# Patient Record
Sex: Female | Born: 1960 | Race: Black or African American | Hispanic: No | Marital: Single | Smoking: Former smoker
Health system: Southern US, Community
[De-identification: ages and names within clinical notes are randomized; demographics above are authoritative.]

## PROBLEM LIST (undated history)

## (undated) DIAGNOSIS — E559 Vitamin D deficiency, unspecified: Secondary | ICD-10-CM

## (undated) DIAGNOSIS — M1990 Unspecified osteoarthritis, unspecified site: Secondary | ICD-10-CM

## (undated) DIAGNOSIS — R079 Chest pain, unspecified: Secondary | ICD-10-CM

## (undated) DIAGNOSIS — N946 Dysmenorrhea, unspecified: Secondary | ICD-10-CM

## (undated) DIAGNOSIS — M549 Dorsalgia, unspecified: Secondary | ICD-10-CM

## (undated) DIAGNOSIS — E119 Type 2 diabetes mellitus without complications: Secondary | ICD-10-CM

## (undated) DIAGNOSIS — I499 Cardiac arrhythmia, unspecified: Secondary | ICD-10-CM

## (undated) DIAGNOSIS — N921 Excessive and frequent menstruation with irregular cycle: Secondary | ICD-10-CM

## (undated) DIAGNOSIS — F419 Anxiety disorder, unspecified: Secondary | ICD-10-CM

## (undated) DIAGNOSIS — I10 Essential (primary) hypertension: Secondary | ICD-10-CM

## (undated) DIAGNOSIS — D219 Benign neoplasm of connective and other soft tissue, unspecified: Secondary | ICD-10-CM

## (undated) DIAGNOSIS — M109 Gout, unspecified: Secondary | ICD-10-CM

## (undated) DIAGNOSIS — I82409 Acute embolism and thrombosis of unspecified deep veins of unspecified lower extremity: Secondary | ICD-10-CM

## (undated) DIAGNOSIS — E039 Hypothyroidism, unspecified: Secondary | ICD-10-CM

## (undated) DIAGNOSIS — R002 Palpitations: Secondary | ICD-10-CM

## (undated) DIAGNOSIS — M2550 Pain in unspecified joint: Secondary | ICD-10-CM

## (undated) DIAGNOSIS — D649 Anemia, unspecified: Secondary | ICD-10-CM

## (undated) DIAGNOSIS — F1910 Other psychoactive substance abuse, uncomplicated: Secondary | ICD-10-CM

## (undated) DIAGNOSIS — E7800 Pure hypercholesterolemia, unspecified: Secondary | ICD-10-CM

## (undated) DIAGNOSIS — R7303 Prediabetes: Secondary | ICD-10-CM

## (undated) DIAGNOSIS — R609 Edema, unspecified: Secondary | ICD-10-CM

## (undated) DIAGNOSIS — K219 Gastro-esophageal reflux disease without esophagitis: Secondary | ICD-10-CM

## (undated) DIAGNOSIS — K760 Fatty (change of) liver, not elsewhere classified: Secondary | ICD-10-CM

## (undated) HISTORY — PX: BUNIONECTOMY: SHX129

## (undated) HISTORY — DX: Benign neoplasm of connective and other soft tissue, unspecified: D21.9

## (undated) HISTORY — DX: Dysmenorrhea, unspecified: N94.6

## (undated) HISTORY — DX: Vitamin D deficiency, unspecified: E55.9

## (undated) HISTORY — PX: CHOLECYSTECTOMY: SHX55

## (undated) HISTORY — DX: Pain in unspecified joint: M25.50

## (undated) HISTORY — DX: Morbid (severe) obesity due to excess calories: E66.01

## (undated) HISTORY — DX: Anemia, unspecified: D64.9

## (undated) HISTORY — DX: Palpitations: R00.2

## (undated) HISTORY — DX: Excessive and frequent menstruation with irregular cycle: N92.1

## (undated) HISTORY — DX: Chest pain, unspecified: R07.9

## (undated) HISTORY — DX: Edema, unspecified: R60.9

## (undated) HISTORY — PX: ECTOPIC PREGNANCY SURGERY: SHX613

## (undated) HISTORY — DX: Hypothyroidism, unspecified: E03.9

## (undated) HISTORY — DX: Unspecified osteoarthritis, unspecified site: M19.90

## (undated) HISTORY — DX: Fatty (change of) liver, not elsewhere classified: K76.0

## (undated) HISTORY — PX: TUBAL LIGATION: SHX77

## (undated) HISTORY — DX: Prediabetes: R73.03

## (undated) HISTORY — DX: Dorsalgia, unspecified: M54.9

---

## 1999-12-27 ENCOUNTER — Emergency Department (HOSPITAL_COMMUNITY): Admission: EM | Admit: 1999-12-27 | Discharge: 1999-12-27 | Payer: Self-pay | Admitting: *Deleted

## 2000-04-21 ENCOUNTER — Emergency Department (HOSPITAL_COMMUNITY): Admission: EM | Admit: 2000-04-21 | Discharge: 2000-04-21 | Payer: Self-pay | Admitting: Emergency Medicine

## 2000-05-19 ENCOUNTER — Emergency Department (HOSPITAL_COMMUNITY): Admission: EM | Admit: 2000-05-19 | Discharge: 2000-05-19 | Payer: Self-pay | Admitting: Emergency Medicine

## 2001-05-16 ENCOUNTER — Emergency Department (HOSPITAL_COMMUNITY): Admission: EM | Admit: 2001-05-16 | Discharge: 2001-05-16 | Payer: Self-pay | Admitting: Emergency Medicine

## 2001-05-16 ENCOUNTER — Encounter: Payer: Self-pay | Admitting: Emergency Medicine

## 2001-07-18 ENCOUNTER — Emergency Department (HOSPITAL_COMMUNITY): Admission: EM | Admit: 2001-07-18 | Discharge: 2001-07-18 | Payer: Self-pay | Admitting: Emergency Medicine

## 2001-07-18 ENCOUNTER — Encounter: Payer: Self-pay | Admitting: Emergency Medicine

## 2001-12-16 ENCOUNTER — Encounter: Payer: Self-pay | Admitting: Internal Medicine

## 2001-12-16 ENCOUNTER — Ambulatory Visit (HOSPITAL_COMMUNITY): Admission: RE | Admit: 2001-12-16 | Discharge: 2001-12-16 | Payer: Self-pay | Admitting: Internal Medicine

## 2002-05-23 ENCOUNTER — Emergency Department (HOSPITAL_COMMUNITY): Admission: EM | Admit: 2002-05-23 | Discharge: 2002-05-23 | Payer: Self-pay | Admitting: Emergency Medicine

## 2002-12-28 ENCOUNTER — Emergency Department (HOSPITAL_COMMUNITY): Admission: EM | Admit: 2002-12-28 | Discharge: 2002-12-28 | Payer: Self-pay | Admitting: Emergency Medicine

## 2003-04-26 ENCOUNTER — Encounter: Payer: Self-pay | Admitting: Emergency Medicine

## 2003-04-26 ENCOUNTER — Emergency Department (HOSPITAL_COMMUNITY): Admission: EM | Admit: 2003-04-26 | Discharge: 2003-04-27 | Payer: Self-pay | Admitting: Emergency Medicine

## 2003-11-23 ENCOUNTER — Emergency Department (HOSPITAL_COMMUNITY): Admission: EM | Admit: 2003-11-23 | Discharge: 2003-11-23 | Payer: Self-pay | Admitting: Emergency Medicine

## 2004-02-10 ENCOUNTER — Emergency Department (HOSPITAL_COMMUNITY): Admission: EM | Admit: 2004-02-10 | Discharge: 2004-02-10 | Payer: Self-pay | Admitting: Emergency Medicine

## 2004-09-25 ENCOUNTER — Ambulatory Visit: Payer: Self-pay | Admitting: Family Medicine

## 2004-11-18 ENCOUNTER — Ambulatory Visit: Payer: Self-pay | Admitting: Nurse Practitioner

## 2004-12-23 ENCOUNTER — Ambulatory Visit (HOSPITAL_COMMUNITY): Admission: RE | Admit: 2004-12-23 | Discharge: 2004-12-23 | Payer: Self-pay | Admitting: Hematology and Oncology

## 2004-12-23 ENCOUNTER — Ambulatory Visit: Payer: Self-pay | Admitting: Internal Medicine

## 2004-12-26 ENCOUNTER — Ambulatory Visit: Payer: Self-pay | Admitting: Internal Medicine

## 2004-12-28 ENCOUNTER — Ambulatory Visit (HOSPITAL_COMMUNITY): Admission: RE | Admit: 2004-12-28 | Discharge: 2004-12-28 | Payer: Self-pay | Admitting: Internal Medicine

## 2005-02-13 ENCOUNTER — Ambulatory Visit: Payer: Self-pay | Admitting: Internal Medicine

## 2005-02-23 ENCOUNTER — Encounter: Admission: RE | Admit: 2005-02-23 | Discharge: 2005-03-25 | Payer: Self-pay | Admitting: Internal Medicine

## 2005-04-24 ENCOUNTER — Emergency Department (HOSPITAL_COMMUNITY): Admission: EM | Admit: 2005-04-24 | Discharge: 2005-04-24 | Payer: Self-pay | Admitting: Emergency Medicine

## 2005-04-28 ENCOUNTER — Ambulatory Visit: Payer: Self-pay | Admitting: Internal Medicine

## 2005-06-03 ENCOUNTER — Ambulatory Visit: Payer: Self-pay | Admitting: Internal Medicine

## 2005-06-11 ENCOUNTER — Ambulatory Visit: Payer: Self-pay | Admitting: Internal Medicine

## 2005-08-30 ENCOUNTER — Emergency Department (HOSPITAL_COMMUNITY): Admission: EM | Admit: 2005-08-30 | Discharge: 2005-08-30 | Payer: Self-pay | Admitting: Emergency Medicine

## 2005-12-18 ENCOUNTER — Ambulatory Visit: Payer: Self-pay | Admitting: Internal Medicine

## 2005-12-25 ENCOUNTER — Ambulatory Visit (HOSPITAL_COMMUNITY): Admission: RE | Admit: 2005-12-25 | Discharge: 2005-12-25 | Payer: Self-pay | Admitting: Family Medicine

## 2006-01-12 ENCOUNTER — Ambulatory Visit: Payer: Self-pay | Admitting: Internal Medicine

## 2006-02-02 ENCOUNTER — Other Ambulatory Visit: Admission: RE | Admit: 2006-02-02 | Discharge: 2006-02-02 | Payer: Self-pay | Admitting: Family Medicine

## 2006-02-02 ENCOUNTER — Ambulatory Visit: Payer: Self-pay | Admitting: Nurse Practitioner

## 2006-02-02 ENCOUNTER — Ambulatory Visit: Payer: Self-pay | Admitting: Internal Medicine

## 2006-02-02 ENCOUNTER — Encounter: Payer: Self-pay | Admitting: Internal Medicine

## 2006-02-09 ENCOUNTER — Encounter (INDEPENDENT_AMBULATORY_CARE_PROVIDER_SITE_OTHER): Payer: Self-pay | Admitting: Internal Medicine

## 2006-03-19 ENCOUNTER — Ambulatory Visit: Payer: Self-pay | Admitting: Family Medicine

## 2006-03-26 ENCOUNTER — Inpatient Hospital Stay (HOSPITAL_COMMUNITY): Admission: AD | Admit: 2006-03-26 | Discharge: 2006-03-26 | Payer: Self-pay | Admitting: Obstetrics & Gynecology

## 2006-04-05 ENCOUNTER — Ambulatory Visit: Payer: Self-pay | Admitting: Internal Medicine

## 2006-05-04 ENCOUNTER — Ambulatory Visit (HOSPITAL_COMMUNITY): Admission: RE | Admit: 2006-05-04 | Discharge: 2006-05-04 | Payer: Self-pay | Admitting: Internal Medicine

## 2006-05-04 ENCOUNTER — Ambulatory Visit: Payer: Self-pay | Admitting: Internal Medicine

## 2006-05-06 ENCOUNTER — Ambulatory Visit: Payer: Self-pay | Admitting: Internal Medicine

## 2006-07-15 ENCOUNTER — Ambulatory Visit: Payer: Self-pay | Admitting: Internal Medicine

## 2006-07-29 ENCOUNTER — Ambulatory Visit: Payer: Self-pay | Admitting: Internal Medicine

## 2006-08-05 ENCOUNTER — Ambulatory Visit: Payer: Self-pay | Admitting: Family Medicine

## 2006-09-23 ENCOUNTER — Ambulatory Visit: Payer: Self-pay | Admitting: Internal Medicine

## 2006-10-12 HISTORY — PX: ABDOMINAL HYSTERECTOMY: SHX81

## 2006-11-04 ENCOUNTER — Encounter (INDEPENDENT_AMBULATORY_CARE_PROVIDER_SITE_OTHER): Payer: Self-pay | Admitting: Specialist

## 2006-11-04 ENCOUNTER — Inpatient Hospital Stay (HOSPITAL_COMMUNITY): Admission: RE | Admit: 2006-11-04 | Discharge: 2006-11-05 | Payer: Self-pay | Admitting: Obstetrics and Gynecology

## 2006-12-24 ENCOUNTER — Ambulatory Visit: Payer: Self-pay | Admitting: Internal Medicine

## 2007-01-12 ENCOUNTER — Ambulatory Visit (HOSPITAL_COMMUNITY): Admission: RE | Admit: 2007-01-12 | Discharge: 2007-01-12 | Payer: Self-pay | Admitting: Family Medicine

## 2007-02-10 ENCOUNTER — Ambulatory Visit: Payer: Self-pay | Admitting: Internal Medicine

## 2007-03-09 ENCOUNTER — Encounter: Admission: RE | Admit: 2007-03-09 | Discharge: 2007-06-07 | Payer: Self-pay | Admitting: Internal Medicine

## 2007-05-23 ENCOUNTER — Encounter (INDEPENDENT_AMBULATORY_CARE_PROVIDER_SITE_OTHER): Payer: Self-pay | Admitting: Internal Medicine

## 2007-05-23 DIAGNOSIS — M67919 Unspecified disorder of synovium and tendon, unspecified shoulder: Secondary | ICD-10-CM | POA: Insufficient documentation

## 2007-05-23 DIAGNOSIS — Z8739 Personal history of other diseases of the musculoskeletal system and connective tissue: Secondary | ICD-10-CM | POA: Insufficient documentation

## 2007-05-23 DIAGNOSIS — M719 Bursopathy, unspecified: Secondary | ICD-10-CM

## 2007-05-23 DIAGNOSIS — N949 Unspecified condition associated with female genital organs and menstrual cycle: Secondary | ICD-10-CM

## 2007-05-23 DIAGNOSIS — E785 Hyperlipidemia, unspecified: Secondary | ICD-10-CM | POA: Insufficient documentation

## 2007-05-23 DIAGNOSIS — N925 Other specified irregular menstruation: Secondary | ICD-10-CM | POA: Insufficient documentation

## 2007-05-23 DIAGNOSIS — N938 Other specified abnormal uterine and vaginal bleeding: Secondary | ICD-10-CM | POA: Insufficient documentation

## 2007-07-14 ENCOUNTER — Ambulatory Visit: Payer: Self-pay | Admitting: Internal Medicine

## 2007-07-14 LAB — CONVERTED CEMR LAB
Albumin: 4.5 g/dL (ref 3.5–5.2)
Alkaline Phosphatase: 45 units/L (ref 39–117)
BUN: 9 mg/dL (ref 6–23)
Calcium: 9.2 mg/dL (ref 8.4–10.5)
Glucose, Bld: 78 mg/dL (ref 70–99)
HDL: 43 mg/dL (ref >39)
LDL Cholesterol: 86 mg/dL (ref 0–99)
Potassium: 4.3 meq/L (ref 3.5–5.3)
Triglycerides: 218 mg/dL — ABNORMAL HIGH (ref <150)

## 2007-09-13 ENCOUNTER — Emergency Department (HOSPITAL_COMMUNITY): Admission: EM | Admit: 2007-09-13 | Discharge: 2007-09-13 | Payer: Self-pay | Admitting: Emergency Medicine

## 2007-09-30 ENCOUNTER — Ambulatory Visit: Payer: Self-pay | Admitting: Internal Medicine

## 2007-10-13 HISTORY — PX: ROTATOR CUFF REPAIR: SHX139

## 2007-10-29 ENCOUNTER — Encounter: Admission: RE | Admit: 2007-10-29 | Discharge: 2007-10-29 | Payer: Self-pay | Admitting: Orthopaedic Surgery

## 2007-11-17 ENCOUNTER — Ambulatory Visit (HOSPITAL_BASED_OUTPATIENT_CLINIC_OR_DEPARTMENT_OTHER): Admission: RE | Admit: 2007-11-17 | Discharge: 2007-11-18 | Payer: Self-pay | Admitting: Orthopaedic Surgery

## 2007-12-05 ENCOUNTER — Encounter: Admission: RE | Admit: 2007-12-05 | Discharge: 2008-03-04 | Payer: Self-pay | Admitting: Orthopaedic Surgery

## 2007-12-14 ENCOUNTER — Ambulatory Visit: Payer: Self-pay | Admitting: Internal Medicine

## 2007-12-28 ENCOUNTER — Ambulatory Visit: Payer: Self-pay | Admitting: Internal Medicine

## 2008-01-04 ENCOUNTER — Ambulatory Visit: Payer: Self-pay | Admitting: Internal Medicine

## 2008-01-10 ENCOUNTER — Ambulatory Visit: Payer: Self-pay | Admitting: Internal Medicine

## 2008-01-13 ENCOUNTER — Ambulatory Visit (HOSPITAL_COMMUNITY): Admission: RE | Admit: 2008-01-13 | Discharge: 2008-01-13 | Payer: Self-pay | Admitting: Family Medicine

## 2008-01-24 ENCOUNTER — Ambulatory Visit: Payer: Self-pay | Admitting: Internal Medicine

## 2008-02-01 ENCOUNTER — Ambulatory Visit: Payer: Self-pay | Admitting: Internal Medicine

## 2008-02-02 ENCOUNTER — Ambulatory Visit: Payer: Self-pay | Admitting: Internal Medicine

## 2008-02-29 ENCOUNTER — Ambulatory Visit: Payer: Self-pay | Admitting: Internal Medicine

## 2008-03-06 ENCOUNTER — Encounter: Admission: RE | Admit: 2008-03-06 | Discharge: 2008-05-17 | Payer: Self-pay | Admitting: Orthopaedic Surgery

## 2008-03-07 ENCOUNTER — Ambulatory Visit: Payer: Self-pay | Admitting: Internal Medicine

## 2008-03-28 ENCOUNTER — Ambulatory Visit: Payer: Self-pay | Admitting: Internal Medicine

## 2008-04-05 ENCOUNTER — Emergency Department (HOSPITAL_COMMUNITY): Admission: EM | Admit: 2008-04-05 | Discharge: 2008-04-05 | Payer: Self-pay | Admitting: Emergency Medicine

## 2008-04-15 ENCOUNTER — Encounter: Admission: RE | Admit: 2008-04-15 | Discharge: 2008-04-15 | Payer: Self-pay | Admitting: Orthopaedic Surgery

## 2008-04-29 ENCOUNTER — Emergency Department (HOSPITAL_COMMUNITY): Admission: EM | Admit: 2008-04-29 | Discharge: 2008-04-29 | Payer: Self-pay | Admitting: Emergency Medicine

## 2008-05-10 ENCOUNTER — Encounter: Admission: RE | Admit: 2008-05-10 | Discharge: 2008-05-10 | Payer: Self-pay | Admitting: Orthopaedic Surgery

## 2008-05-28 ENCOUNTER — Ambulatory Visit: Payer: Self-pay | Admitting: Internal Medicine

## 2008-06-20 ENCOUNTER — Encounter: Admission: RE | Admit: 2008-06-20 | Discharge: 2008-08-28 | Payer: Self-pay | Admitting: Orthopaedic Surgery

## 2008-08-13 ENCOUNTER — Ambulatory Visit: Payer: Self-pay | Admitting: Internal Medicine

## 2008-08-29 ENCOUNTER — Ambulatory Visit: Payer: Self-pay | Admitting: Internal Medicine

## 2008-09-05 ENCOUNTER — Ambulatory Visit: Payer: Self-pay | Admitting: Internal Medicine

## 2008-09-13 ENCOUNTER — Encounter: Admission: RE | Admit: 2008-09-13 | Discharge: 2008-10-08 | Payer: Self-pay | Admitting: Orthopaedic Surgery

## 2008-10-09 ENCOUNTER — Ambulatory Visit: Payer: Self-pay | Admitting: Internal Medicine

## 2008-10-09 LAB — CONVERTED CEMR LAB
Total CHOL/HDL Ratio: 3.8
VLDL: 29 mg/dL (ref 0–40)

## 2008-10-16 ENCOUNTER — Encounter: Admission: RE | Admit: 2008-10-16 | Discharge: 2008-11-13 | Payer: Self-pay | Admitting: Orthopaedic Surgery

## 2008-11-26 ENCOUNTER — Ambulatory Visit: Payer: Self-pay | Admitting: Internal Medicine

## 2008-12-06 ENCOUNTER — Emergency Department (HOSPITAL_COMMUNITY): Admission: EM | Admit: 2008-12-06 | Discharge: 2008-12-06 | Payer: Self-pay | Admitting: Emergency Medicine

## 2009-02-01 ENCOUNTER — Ambulatory Visit (HOSPITAL_COMMUNITY): Admission: RE | Admit: 2009-02-01 | Discharge: 2009-02-01 | Payer: Self-pay | Admitting: Internal Medicine

## 2009-03-21 ENCOUNTER — Ambulatory Visit: Payer: Self-pay | Admitting: Internal Medicine

## 2009-07-27 ENCOUNTER — Emergency Department (HOSPITAL_COMMUNITY): Admission: EM | Admit: 2009-07-27 | Discharge: 2009-07-27 | Payer: Self-pay | Admitting: Emergency Medicine

## 2009-09-25 ENCOUNTER — Ambulatory Visit: Payer: Self-pay | Admitting: Internal Medicine

## 2009-10-07 ENCOUNTER — Ambulatory Visit: Payer: Self-pay | Admitting: Internal Medicine

## 2009-10-07 LAB — CONVERTED CEMR LAB
ALT: 20 units/L (ref 0–35)
AST: 13 units/L (ref 0–37)
Albumin: 4.4 g/dL (ref 3.5–5.2)
Alkaline Phosphatase: 70 units/L (ref 39–117)
LDL Cholesterol: 142 mg/dL — ABNORMAL HIGH (ref 0–99)
Potassium: 4 meq/L (ref 3.5–5.3)
Sodium: 141 meq/L (ref 135–145)
Total Protein: 7.8 g/dL (ref 6.0–8.3)

## 2009-11-20 ENCOUNTER — Ambulatory Visit: Payer: Self-pay | Admitting: Internal Medicine

## 2009-11-20 LAB — CONVERTED CEMR LAB: TSH: 2.573 microintl units/mL (ref 0.350–4.500)

## 2009-11-29 ENCOUNTER — Ambulatory Visit: Payer: Self-pay | Admitting: Internal Medicine

## 2009-12-04 ENCOUNTER — Ambulatory Visit: Payer: Self-pay | Admitting: Internal Medicine

## 2009-12-24 ENCOUNTER — Ambulatory Visit: Payer: Self-pay | Admitting: Internal Medicine

## 2009-12-30 ENCOUNTER — Emergency Department (HOSPITAL_COMMUNITY): Admission: EM | Admit: 2009-12-30 | Discharge: 2009-12-30 | Payer: Self-pay | Admitting: Emergency Medicine

## 2010-01-15 ENCOUNTER — Emergency Department (HOSPITAL_COMMUNITY): Admission: EM | Admit: 2010-01-15 | Discharge: 2010-01-15 | Payer: Self-pay | Admitting: Emergency Medicine

## 2010-03-06 ENCOUNTER — Ambulatory Visit: Payer: Self-pay | Admitting: Internal Medicine

## 2010-04-11 ENCOUNTER — Ambulatory Visit: Payer: Self-pay | Admitting: Internal Medicine

## 2010-05-09 ENCOUNTER — Emergency Department (HOSPITAL_COMMUNITY): Admission: EM | Admit: 2010-05-09 | Discharge: 2010-05-09 | Payer: Self-pay | Admitting: Emergency Medicine

## 2010-07-24 ENCOUNTER — Ambulatory Visit: Payer: Self-pay | Admitting: Internal Medicine

## 2010-10-04 ENCOUNTER — Inpatient Hospital Stay (HOSPITAL_COMMUNITY)
Admission: AD | Admit: 2010-10-04 | Discharge: 2010-10-04 | Payer: Self-pay | Source: Home / Self Care | Attending: Obstetrics & Gynecology | Admitting: Obstetrics & Gynecology

## 2010-11-05 ENCOUNTER — Other Ambulatory Visit (HOSPITAL_COMMUNITY): Payer: Self-pay | Admitting: Family Medicine

## 2010-11-05 DIAGNOSIS — Z139 Encounter for screening, unspecified: Secondary | ICD-10-CM

## 2010-11-05 DIAGNOSIS — Z1231 Encounter for screening mammogram for malignant neoplasm of breast: Secondary | ICD-10-CM

## 2010-11-13 ENCOUNTER — Ambulatory Visit (HOSPITAL_COMMUNITY): Payer: Self-pay

## 2010-11-13 ENCOUNTER — Emergency Department (HOSPITAL_COMMUNITY)
Admission: EM | Admit: 2010-11-13 | Discharge: 2010-11-13 | Disposition: A | Discharge status: Home / Self Care | Payer: Medicaid Other | Attending: Emergency Medicine | Admitting: Emergency Medicine

## 2010-11-13 DIAGNOSIS — M79609 Pain in unspecified limb: Secondary | ICD-10-CM | POA: Insufficient documentation

## 2010-11-13 DIAGNOSIS — G8929 Other chronic pain: Secondary | ICD-10-CM | POA: Insufficient documentation

## 2010-11-13 DIAGNOSIS — Y92009 Unspecified place in unspecified non-institutional (private) residence as the place of occurrence of the external cause: Secondary | ICD-10-CM | POA: Insufficient documentation

## 2010-11-13 DIAGNOSIS — K219 Gastro-esophageal reflux disease without esophagitis: Secondary | ICD-10-CM | POA: Insufficient documentation

## 2010-11-13 DIAGNOSIS — Z79899 Other long term (current) drug therapy: Secondary | ICD-10-CM | POA: Insufficient documentation

## 2010-11-13 DIAGNOSIS — F17200 Nicotine dependence, unspecified, uncomplicated: Secondary | ICD-10-CM | POA: Insufficient documentation

## 2010-11-13 DIAGNOSIS — Z8739 Personal history of other diseases of the musculoskeletal system and connective tissue: Secondary | ICD-10-CM | POA: Insufficient documentation

## 2010-11-13 DIAGNOSIS — Y9367 Activity, basketball: Secondary | ICD-10-CM | POA: Insufficient documentation

## 2010-11-13 DIAGNOSIS — X500XXA Overexertion from strenuous movement or load, initial encounter: Secondary | ICD-10-CM | POA: Insufficient documentation

## 2010-11-13 DIAGNOSIS — S838X9A Sprain of other specified parts of unspecified knee, initial encounter: Secondary | ICD-10-CM | POA: Insufficient documentation

## 2010-11-26 ENCOUNTER — Encounter (INDEPENDENT_AMBULATORY_CARE_PROVIDER_SITE_OTHER): Payer: Self-pay | Admitting: *Deleted

## 2010-11-26 LAB — CONVERTED CEMR LAB
ALT: 13 units/L (ref 0–35)
Total Protein: 7.5 g/dL (ref 6.0–8.3)

## 2010-11-29 ENCOUNTER — Emergency Department (HOSPITAL_COMMUNITY)
Admission: EM | Admit: 2010-11-29 | Discharge: 2010-11-29 | Disposition: A | Discharge status: Home / Self Care | Payer: Medicaid Other | Attending: Emergency Medicine | Admitting: Emergency Medicine

## 2010-11-29 DIAGNOSIS — I824Z9 Acute embolism and thrombosis of unspecified deep veins of unspecified distal lower extremity: Secondary | ICD-10-CM | POA: Insufficient documentation

## 2010-11-29 DIAGNOSIS — E7800 Pure hypercholesterolemia, unspecified: Secondary | ICD-10-CM | POA: Insufficient documentation

## 2010-11-29 DIAGNOSIS — F329 Major depressive disorder, single episode, unspecified: Secondary | ICD-10-CM | POA: Insufficient documentation

## 2010-11-29 DIAGNOSIS — M79609 Pain in unspecified limb: Secondary | ICD-10-CM

## 2010-11-29 DIAGNOSIS — F3289 Other specified depressive episodes: Secondary | ICD-10-CM | POA: Insufficient documentation

## 2010-11-29 DIAGNOSIS — M129 Arthropathy, unspecified: Secondary | ICD-10-CM | POA: Insufficient documentation

## 2010-11-29 DIAGNOSIS — K219 Gastro-esophageal reflux disease without esophagitis: Secondary | ICD-10-CM | POA: Insufficient documentation

## 2010-11-29 LAB — PROTIME-INR
INR: 0.99 (ref 0.00–1.49)
Prothrombin Time: 13.3 seconds (ref 11.6–15.2)

## 2010-12-10 ENCOUNTER — Ambulatory Visit: Payer: Medicaid Other | Admitting: Rehabilitation

## 2010-12-15 ENCOUNTER — Ambulatory Visit: Payer: Medicaid Other | Attending: Orthopaedic Surgery | Admitting: Physical Therapy

## 2010-12-15 DIAGNOSIS — M25559 Pain in unspecified hip: Secondary | ICD-10-CM | POA: Insufficient documentation

## 2010-12-15 DIAGNOSIS — M545 Low back pain, unspecified: Secondary | ICD-10-CM | POA: Insufficient documentation

## 2010-12-15 DIAGNOSIS — IMO0001 Reserved for inherently not codable concepts without codable children: Secondary | ICD-10-CM | POA: Insufficient documentation

## 2010-12-15 DIAGNOSIS — M2569 Stiffness of other specified joint, not elsewhere classified: Secondary | ICD-10-CM | POA: Insufficient documentation

## 2010-12-23 ENCOUNTER — Ambulatory Visit: Payer: Medicaid Other | Admitting: Physical Therapy

## 2010-12-24 ENCOUNTER — Emergency Department (HOSPITAL_COMMUNITY): Payer: Medicaid Other

## 2010-12-24 ENCOUNTER — Emergency Department (HOSPITAL_COMMUNITY)
Admission: EM | Admit: 2010-12-24 | Discharge: 2010-12-24 | Disposition: A | Discharge status: Home / Self Care | Payer: Medicaid Other | Attending: Emergency Medicine | Admitting: Emergency Medicine

## 2010-12-24 DIAGNOSIS — T148XXA Other injury of unspecified body region, initial encounter: Secondary | ICD-10-CM | POA: Insufficient documentation

## 2010-12-24 DIAGNOSIS — K219 Gastro-esophageal reflux disease without esophagitis: Secondary | ICD-10-CM | POA: Insufficient documentation

## 2010-12-24 DIAGNOSIS — E785 Hyperlipidemia, unspecified: Secondary | ICD-10-CM | POA: Insufficient documentation

## 2010-12-24 DIAGNOSIS — I10 Essential (primary) hypertension: Secondary | ICD-10-CM | POA: Insufficient documentation

## 2010-12-24 DIAGNOSIS — M79609 Pain in unspecified limb: Secondary | ICD-10-CM | POA: Insufficient documentation

## 2010-12-24 DIAGNOSIS — I82409 Acute embolism and thrombosis of unspecified deep veins of unspecified lower extremity: Secondary | ICD-10-CM | POA: Insufficient documentation

## 2010-12-24 DIAGNOSIS — Y92009 Unspecified place in unspecified non-institutional (private) residence as the place of occurrence of the external cause: Secondary | ICD-10-CM | POA: Insufficient documentation

## 2010-12-24 DIAGNOSIS — Z7901 Long term (current) use of anticoagulants: Secondary | ICD-10-CM | POA: Insufficient documentation

## 2010-12-24 DIAGNOSIS — W010XXA Fall on same level from slipping, tripping and stumbling without subsequent striking against object, initial encounter: Secondary | ICD-10-CM | POA: Insufficient documentation

## 2010-12-30 ENCOUNTER — Ambulatory Visit: Payer: Medicaid Other | Admitting: Physical Therapy

## 2010-12-31 LAB — GLUCOSE, CAPILLARY: Glucose-Capillary: 115 mg/dL — ABNORMAL HIGH (ref 70–99)

## 2010-12-31 LAB — BASIC METABOLIC PANEL
BUN: 8 mg/dL (ref 6–23)
CO2: 27 mEq/L (ref 19–32)
Calcium: 8.8 mg/dL (ref 8.4–10.5)
Creatinine, Ser: 0.84 mg/dL (ref 0.4–1.2)
GFR calc Af Amer: >60 mL/min (ref >60)

## 2010-12-31 LAB — POCT CARDIAC MARKERS

## 2011-01-06 ENCOUNTER — Ambulatory Visit: Payer: Medicaid Other | Admitting: Physical Therapy

## 2011-01-09 ENCOUNTER — Encounter (INDEPENDENT_AMBULATORY_CARE_PROVIDER_SITE_OTHER): Payer: Self-pay | Admitting: *Deleted

## 2011-01-09 LAB — CONVERTED CEMR LAB
BUN: 9 mg/dL (ref 6–23)
Chloride: 101 meq/L (ref 96–112)
Potassium: 3.7 meq/L (ref 3.5–5.3)
Sodium: 141 meq/L (ref 135–145)

## 2011-01-10 ENCOUNTER — Emergency Department (HOSPITAL_COMMUNITY)
Admission: EM | Admit: 2011-01-10 | Discharge: 2011-01-10 | Disposition: A | Discharge status: Home / Self Care | Payer: Medicaid Other | Attending: Emergency Medicine | Admitting: Emergency Medicine

## 2011-01-10 DIAGNOSIS — I10 Essential (primary) hypertension: Secondary | ICD-10-CM | POA: Insufficient documentation

## 2011-01-10 DIAGNOSIS — Z7901 Long term (current) use of anticoagulants: Secondary | ICD-10-CM | POA: Insufficient documentation

## 2011-01-10 DIAGNOSIS — IMO0002 Reserved for concepts with insufficient information to code with codable children: Secondary | ICD-10-CM | POA: Insufficient documentation

## 2011-01-10 DIAGNOSIS — Z86718 Personal history of other venous thrombosis and embolism: Secondary | ICD-10-CM | POA: Insufficient documentation

## 2011-01-10 LAB — PROTIME-INR: Prothrombin Time: 24.5 seconds — ABNORMAL HIGH (ref 11.6–15.2)

## 2011-02-24 NOTE — Op Note (Signed)
NAMEZENIAH, BRINEY NO.:  [id number]   MEDICAL RECORD NO.:  [id number]          PATIENT TYPE:  AMB   LOCATION:  DSC                          FACILITY:  [facility]   PHYSICIAN:  Claude Manges. Whitfield, M.D.DATE OF BIRTH:  [birth year]   DATE OF PROCEDURE:  11/17/2007  DATE OF DISCHARGE:  11/18/2007                               OPERATIVE REPORT   PREOPERATIVE DIAGNOSES:  1. Impingement, right shoulder, with rotator cuff tear.  2. Degenerative joint disease, acromioclavicular joint.   POSTOPERATIVE DIAGNOSES:  1. Impingement, right shoulder, with rotator cuff tear.  2. Degenerative joint disease, acromioclavicular joint.   PROCEDURES:  1. Diagnostic arthroscopy, right shoulder.  2. Arthroscopic subacromial decompression.  3. Arthroscopic distal clavicle resection.  4. Mini open rotator cuff tear repair.   SURGEON:  Claude Manges. Cleophas Dunker, M.D.   ASSISTANT:  Arlys John D. Petrarca, P.A.-C.   ANESTHESIA:  General with interscalene nerve block.   COMPLICATIONS:  None.   HISTORY:  A 50 year old female, has had over a year history of recurrent  pain in both of her shoulders, more recently right worse than left.  She  has evidence of impingement syndrome.  She is had cortisone and pain  medicine and exercises, only to have recurrent pain to the point of  compromise.  She has been having particular trouble on the right side,  where she has difficulty placing her arm over her head and sleeping.  We  did obtain an MRI scan where there may be a rotator cuff tear along the  far lateral supraspinatus associated with bulky AC joint degenerative  change, subacromial deltoid bursitis and a type 2 acromion.  She wishes  to proceed with surgical evaluation.   PROCEDURE:  With the patient comfortable on the operating table and  under general orotracheal anesthesia with a supplemental interscalene  nerve block, the patient was placed in semi-sitting position with the  shoulder frame.  The  right shoulder was then prepped with a DuraPrep  from the base of the neck circumferentially to below the elbow.  Sterile  draping was performed.   A marking pen was used to outline the [facility] joint, the coracoid and the  acromion.  At a point a fingerbreadth posterior and medial to the  posterior angle of the acromion, a small stab wound was made.  The  arthroscope was then easily placed into the shoulder joint.  Diagnostic  arthroscopy revealed some very minimal partial tearing of the rotator  cuff and specifically the supraspinatus.  I did not see any loose  bodies, labral pathology or chondromalacia.  The subscapularis was  intact.   A second portal was established anteriorly and shaving of the partial  rotator cuff tear was performed.   The scope was then placed in the subacromial space posteriorly, the  cannula in the subacromial space anteriorly and the lateral portal  established in the subacromial space.  An arthroscopic subacromial  decompression was performed.  There was a moderate amount of bursal  tissue that was resected.  There was a moderate amount of synovitis at  the [facility] joint.  There was obvious overhang  of the anterior acromion.  An  anterior-inferior acromioplasty was performed with a 6-mm bur.  There  was even evidence of impingement laterally, and a lateral acromionectomy  was performed, a very nice decompression.  At that point I could  visualize the cuff tear anteriorly.  It was U-shaped the insertion of  the supraspinatus.  I did a distal clavicle resection with a 6-mm bur  with a very nice resection and removed all the hypertrophic synovial  tissue.   At that point a mini open rotator cuff tear repair was performed.  About  an inch and a half incision was made along the anterior aspect of the  shoulder, carried down through abundant adipose tissue.  A self-  retaining retractor was inserted.  Deltoid fascia was incised along the  length of the skin incision.   Subacromial space was entered.  The cuff  tear was identified.  I i.e. abraded the bone surface with a rongeur so  there was a nice bleeding surface and inserted to Mitek anchors for  repair.  A nice repair by finger palpation.  I had nice decompression of  the acromion.   The wound was irrigated, the deltoid fascia closed with a running  0Vicryl, the subcu in several layers of 2-0 Vicryl, skin closed with  Steri-Strips over Benzoin.  A sterile bulky dressing was applied,  followed by a sling.   PLAN:  Percocet for pain.  Recovery care overnight.  Office 1 week.      Claude Manges. Cleophas Dunker, M.D.  Electronically Signed     PWW/MEDQ  D:  11/17/2007  T:  11/18/2007  Job:  [id number]

## 2011-02-27 NOTE — Op Note (Signed)
NAMEAJAYA, CRUTCHFIELD NO.:  [id number]   MEDICAL RECORD NO.:  [id number]          PATIENT TYPE:  INP   LOCATION:  9319                          [facility]ACILITY:  [facility]   PHYSICIAN:  Osborn Coho, M.D.   DATE OF BIRTH:  Mar 05, 1961   DATE OF PROCEDURE:  11/04/2006  DATE OF DISCHARGE:  11/05/2006                               OPERATIVE REPORT   PREOPERATIVE DIAGNOSES:  1. Symptomatic fibroids.  2. Metrorrhagia.  3. Dysmenorrhea.  4. Anemia.   POSTOPERATIVE DIAGNOSIS:  1. Symptomatic fibroids.  2. Metrorrhagia.  3. Dysmenorrhea.  4. Anemia.   PROCEDURE:  Laparoscopically-assisted vaginal hysterectomy.   ATTENDING:  Dr. Osborn Coho.   ASSISTANT:  Elmira J. Lowell Guitar, PA-C.   ANESTHESIA:  General.   SPECIMENS TO PATHOLOGY:  Uterus and cervix weighing 239 grams.   FLUIDS:  1800 mL.   URINE OUTPUT:  500 mL.   ESTIMATED BLOOD LOSS:  200 mL.   COMPLICATIONS:  None.   FINDINGS:  Hemorrhagic cyst on the right ovary and no apparent left  ovary or fallopian tube visualized.   DESCRIPTION OF PROCEDURE:  The patient was taken to the operating room  after the risks, benefits, and alternatives were reviewed with the  patient and patient verbalized understanding and consent signed and  witnessed.  The patient was placed under general per anesthesia and  prepped and draped in the normal sterile fashion in the dorsal lithotomy  position.  A bivalve speculum was placed in the patient's vagina and a  Hulka placed for intrauterine manipulation. The speculum was removed and  attention was turned to the abdomen where a 10-mm incision was made at  the umbilicus and a Veress needle passed into the intra-abdominal cavity  and pneumoperitoneum achieved.  The Veress needle was removed and 10-mm  trocar advanced into the intra-abdominal cavity and laparoscope  introduced with findings as mentioned above.  A 5-mm left lower quadrant  incision was made and the 5-mm trocar advanced  under direct  visualization.  The same was done in the right lower quadrant.  There  were no apparent adhesions to the uterus and it was freely mobile so a  decision made to proceed from below after ureters were identified  bilaterally with apparently normal peristalsis.  The laparoscope was  removed and attention was turned to the perineum where a weighted  speculum was placed in the patient's vagina.  The Hulka tenaculum was  removed and a single-tooth tenaculum was placed on the anterior lip of  the cervix.  Dilute Pitressin was administered at the cervix for  hydrodissection and the cervix was circumscribed with the Bovie.  The  posterior cul-de-sac was entered with the Mayo scissors and the  uterosacrals were bilaterally clamped with a curved Heaney, cut and  suture ligated using #0 Vicryl.  The anterior cul-de-sac was entered  without difficulty and Deaver placed for retraction anteriorly.  In a  bilateral and sequential fashion, the paracervical tissue incorporating  the cardinal ligaments were clamped with a curved Heaney, cut and suture  ligated using #0 Vicryl.  This continued along the parametrial tissue to  the level of the pedicles at the fundus.  The fundus was exteriorized  with a towel clamp and the left pedicle was clamped with a large Kelly,  cut and ligated using #0 Vicryl and then suture ligated using a second  suture of #0 Vicryl.  The same was done of the right pedicle after  clamping the right utero-ovarian ligament.  The uterus and cervix were  sent to pathology, small bleeders were made hemostatic with a 2-0 Vicryl  stitch.  A free needle was used to tie the angles bilaterally with the  uterosacral sutures.  A McCall Culdoplasty stitch was placed.  The  vaginal cuff was repaired with #0 Vicryl via figure-of-eight stitches.  The cuff was noted to be hemostatic and McCall stitch tied.  Gloves were  changed and attention was then turned to the abdomen once again and   laparoscope introduced and good hemostasis was noted.  A piece of  Gelfoam was placed over the right ovary where there was a small amount  of bleeding from the hemorrhagic cyst.  The intra-abdominal cavity was  copiously irrigated and suctioned.  The trocars were removed under  direct visualization and pneumoperitoneum relieved.  The umbilical  trocar was removed as well under direct visualization.  The 10-mm  incision at the umbilicus was repaired on the fascia with # Vicryl and  the skin was repaired with 3-0 Monocryl via a subcuticular stitch.  The  right and left lower quadrant incisions were repaired with Dermabond.  Sponge, lap and needle count was correct.  The patient tolerated the  procedure well and was returned to the recovery room in good condition.      Osborn Coho, M.D.  Electronically Signed     AR/MEDQ  D:  11/08/2006  T:  11/08/2006  Job:  [id number]

## 2011-02-27 NOTE — H&P (Signed)
NAME:  [name], [name] NO.:  [id number]   MEDICAL RECORD NO.:  [id number]          PATIENT TYPE:  AMB   LOCATION:  SDC                           [facility]AC[facility]   PHYSICIAN:  Osborn Coho, M.D.   DATE OF BIRTH:  1961/08/03   DATE OF ADMISSION:  11/04/2006  DATE OF DISCHARGE:                              HISTORY & PHYSICAL   HISTORY OF PRESENT ILLNESS:  [name] is a 50 year old, single,  African-American female, gravida 6, para 5-1-3-6, who is status post  bilateral tubal ligation, who presents for laparoscopic-assisted vaginal  hysterectomy because of symptomatic uterine fibroids, menometrorrhagia,  and dysmenorrhea.  The patient was referred to [facility]  from [facility] due to patient's year-long suffering with a 7-day  menstrual flow which would occur sometimes twice a month.  During those  times, she would wear 3 pads at a time and change them every 45 minutes.  Even with these frequent changes, the patient often would soil both her  clothing and her bed linen.  These episodes of bleeding were also  accompanied by severe cramping with lower back pain which she rates as a  10/10 on a 10-point pain scale.  The patient has required narcotic  analgesia in order to decrease her pain from a 10/10 on a 10-point pain  scale to 4/10.  She denies any urinary tract symptoms, vaginitis  symptoms, fever, nausea, vomiting, or diarrhea.  She admits, however, to  dyspareunia which is not relieved by position change.  A pelvic  ultrasound in November 2007 showed a uterus measuring 10.8 x 6.5 x 6.6  cm with four measurable fibroids ranging from 1.8 cm to 2.9 cm.  During  this studies, the patient's ovaries both appeared within normal range  bilaterally.  An endometrial biopsy done at the same time showed  proliferative endometrium with no evidence of hyperplasia or malignancy.  A CBC done in November 2007 was essentially within normal limits, though  her  hemoglobin and hematocrit were low at 10.2 and 32.4, respectively.  [name] thyroid function test was within normal range.  At  patient's first visit, she was decisive in her desire for hysterectomy  and definitive management of her symptoms.  However, both medical and  surgical management options were review to include but not limited to  myomectomy, hormonal therapy, uterine artery embolization, and  hysterectomy.  The patient reiterated her desire to proceed with  hysterectomy and has consented to the same.   PAST OB HISTORY:  Gravida 9, para 5-1-3-6.  The patient had one ectopic  pregnancy which was removed surgically.   GYN HISTORY:  Menarche at 50 years old, last menstrual period October 10, 2006.  She denies any history of sexually transmitted diseases or  abnormal Pap smears.  [name] last normal Pap smear was April  2007.   PAST MEDICAL HISTORY:  1. 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  2. Hyperlipidemia.  3. Gastroesophageal reflux disease.   PAST SURGICAL HISTORY:  1. In 1971, tonsillectomy.  2. In 1987, laparotomy for ectopic pregnancy.  3. In 1990, cholecystectomy.  4. The  patient has also undergone a bilateral tubal ligation.   She denies any problems with anesthesia.   FAMILY HISTORY:  Hypertension, diabetes mellitus, and arthritis.   HABITS:  She does not use tobacco.  She does consume alcohol in the form  of beer, a six-pack per week.   SOCIAL HISTORY:  The patient is single and unemployed.   CURRENT MEDICATIONS:  1. Tramadol 50 mg 2 tablets every 8 hours as needed for pain.  2. Hydrocodone 1 tablet every 6 hours as needed for pain.  3. Omeprazole 200 mg twice daily.  4. TriCor 145 mg daily.  5. Ferrous sulfate 325 mg 1 tablet twice daily.   ALLERGIES:  No known drug allergies.   REVIEW OF SYSTEMS:  The patient denies any chest pain, shortness of  breath, headaches, vision changes, unilateral weakness, constipation,  fever, chills.   Except as mentioned in the History of Present Illness,  the patient's Review of Systems is negative.   PHYSICAL EXAMINATION:  VITAL SIGNS: Blood pressure 122/74, weight 280  pounds, height 5 feet 6-1/2 inches tall.  NECK: Supple without masses.  There is no thyromegaly or cervical  adenopathy.  HEART: Regular rate and rhythm.  There is no murmur.  LUNGS: Clear.  BACK: No CVA tenderness.  ABDOMEN:  Bowel sounds are present.  It is soft without tenderness,  guarding, rebound, or organomegaly.  EXTREMITIES: No clubbing, cyanosis, or edema.  PELVIC: EG/BUS is within normal limits.  Vagina is rugose.  Cervix is  nontender without lesions.  Uterus appears upper limits of normal size  and is without tenderness, though do note the patient's pelvic exam is  limited by her body habitus.  Adnexa without tenderness or masses.  Rectovaginal exam without tenderness or masses.   IMPRESSION:  1. Symptomatic uterine fibroids.  2. Menometrorrhagia.  3. Dysmenorrhea.  4. Anemia.   DISPOSITION:  A discussion was held with patient regarding the  indications for her procedures along with their risks which include but  are not limited to reaction to anesthesia, damage to adjacent organs,  infection, and excessive  bleeding.  The patient verbalized understanding of these risks and has  accepted them, having consented to proceed with a laparoscopic-assisted  vaginal hysterectomy at [facility] [place] on November 04, 2006, at 9:30 a.m.      Elmira J. Adline Peals.      Osborn Coho, M.D.  Electronically Signed    EJP/MEDQ  D:  11/01/2006  T:  11/01/2006  Job:  [id number]

## 2011-02-27 NOTE — Discharge Summary (Signed)
[name], [name] NO.:  [id number]   MEDICAL RECORD NO.:  [id number]          PATIENT TYPE:  INP   LOCATION:  9319                          FACILITY:  [facility]   PHYSICIAN:  Osborn Coho, M.D.   DATE OF BIRTH:  03-03-1961   DATE OF ADMISSION:  11/04/2006  DATE OF DISCHARGE:  11/05/2006                               DISCHARGE SUMMARY   DISCHARGE DIAGNOSES:  1. Symptomatic uterine fibroids.  2. Menometrorrhagia.  3. Dysmenorrhea and anemia.   OPERATION:  On the date of admission, the patient underwent a  laparoscopically-assisted vaginal hysterectomy, tolerating procedure  well.  The patient was found to have a uterus weighing approximately 239  grams which was sent to pathology following her procedure.   HISTORY OF PRESENT ILLNESS:  Ms. [name] is a 50 year old female  gravida 6, para 5-1-3-6 who was status post bilateral tubal ligation who  presented for a laparoscopically-assisted vaginal hysterectomy because  of symptomatic uterine fibroids, menometrorrhagia and dysmenorrhea.  Please see patient's dictated history and physical examination for  details.   PREOPERATIVE PHYSICAL EXAMINATION:  Blood pressure 122/74, weight 280  pounds, height 5 feet 6-1/2 inches tall.  GENERAL:  Within normal limits.  PELVIC EXAM:  EGBUS within normal limits.  Vagina was rugose.  Cervix  was nontender without lesions.  Uterus appeared upper limits of normal  size and without tenderness, though do note that patient's pelvic exam  was limited by her body habitus.  Adnexa was without tenderness or  masses.  Rectovaginal was without tenderness or masses.   HOSPITAL COURSE:  On the date of admission, the patient underwent  aforementioned procedures, tolerating them all well.  Postoperative  course was unremarkable with patient resuming bowel and bladder function  by postoperative day #1 and therefore deemed ready for discharge home.  Though the patient's postoperative  hemoglobin was 10.59 (preoperative  hemoglobin 12.1), she tolerated this level well, giving no evidence of  symptoms.   DISCHARGE MEDICATIONS:  1. Tylox 1-2 tablets every 4 hours as needed for pain.  2. Ibuprofen 600 mg with food every 6 hours for 3 days, then as needed      for pain.  3. Colace 100 mg twice daily until her bowel movements are regular.  4. Slow Fe twice daily for 6 weeks.  5. Augmentin 875 mg twice daily for 5 days.   FOLLOW UP:  The patient is scheduled for a 6-week followup with Dr.  Osborn Coho on December 16, 2006 at 2:30 p.m.   DISCHARGE INSTRUCTIONS:  The patient was given a copy of [facility] postoperative instruction sheet.  She was further  advised to avoid driving for 2 weeks, heavy lifting for 4 weeks,  intercourse for 6 weeks; that she may shower, walk up steps and should  increase her activities slowly.  [name] diet was without  restriction.   FINAL PATHOLOGY:  Uterus and cervix:  Cervix - squamous metaplasia;  endometrium - secretory, no hyperplasia or carcinoma; and myometrium -  leiomyomata, intramural.      [name].      Osborn Coho,  M.D.  Electronically Signed    EJP/MEDQ  D:  11/23/2006  T:  11/23/2006  Job:  [id number]

## 2011-06-05 ENCOUNTER — Other Ambulatory Visit (HOSPITAL_COMMUNITY): Payer: Self-pay | Admitting: Internal Medicine

## 2011-06-05 DIAGNOSIS — Z1231 Encounter for screening mammogram for malignant neoplasm of breast: Secondary | ICD-10-CM

## 2011-06-19 ENCOUNTER — Ambulatory Visit (HOSPITAL_COMMUNITY)
Admission: RE | Admit: 2011-06-19 | Discharge: 2011-06-19 | Disposition: A | Discharge status: Home / Self Care | Payer: Medicaid Other | Source: Ambulatory Visit | Attending: Internal Medicine | Admitting: Internal Medicine

## 2011-06-19 DIAGNOSIS — Z1231 Encounter for screening mammogram for malignant neoplasm of breast: Secondary | ICD-10-CM | POA: Insufficient documentation

## 2011-07-03 LAB — POCT HEMOGLOBIN-HEMACUE: Hemoglobin: 11.9 — ABNORMAL LOW

## 2011-07-09 LAB — RAPID STREP SCREEN (MED CTR MEBANE ONLY): Streptococcus, Group A Screen (Direct): NEGATIVE

## 2011-07-18 ENCOUNTER — Emergency Department (HOSPITAL_COMMUNITY)
Admission: EM | Admit: 2011-07-18 | Discharge: 2011-07-18 | Disposition: A | Discharge status: Home / Self Care | Payer: Medicaid Other | Attending: Emergency Medicine | Admitting: Emergency Medicine

## 2011-07-18 DIAGNOSIS — T398X5A Adverse effect of other nonopioid analgesics and antipyretics, not elsewhere classified, initial encounter: Secondary | ICD-10-CM | POA: Insufficient documentation

## 2011-07-18 DIAGNOSIS — Z86718 Personal history of other venous thrombosis and embolism: Secondary | ICD-10-CM | POA: Insufficient documentation

## 2011-07-18 DIAGNOSIS — Z888 Allergy status to other drugs, medicaments and biological substances status: Secondary | ICD-10-CM | POA: Insufficient documentation

## 2011-07-18 DIAGNOSIS — I10 Essential (primary) hypertension: Secondary | ICD-10-CM | POA: Insufficient documentation

## 2011-07-28 ENCOUNTER — Ambulatory Visit: Payer: Medicaid Other | Attending: Anesthesiology | Admitting: Physical Therapy

## 2011-07-28 DIAGNOSIS — M545 Low back pain, unspecified: Secondary | ICD-10-CM | POA: Insufficient documentation

## 2011-07-28 DIAGNOSIS — M25559 Pain in unspecified hip: Secondary | ICD-10-CM | POA: Insufficient documentation

## 2011-07-28 DIAGNOSIS — IMO0001 Reserved for inherently not codable concepts without codable children: Secondary | ICD-10-CM | POA: Insufficient documentation

## 2011-09-14 ENCOUNTER — Other Ambulatory Visit: Payer: Self-pay | Admitting: Physical Medicine and Rehabilitation

## 2011-09-14 DIAGNOSIS — M549 Dorsalgia, unspecified: Secondary | ICD-10-CM

## 2011-09-15 ENCOUNTER — Other Ambulatory Visit: Payer: Self-pay | Admitting: Physical Medicine and Rehabilitation

## 2011-09-15 ENCOUNTER — Ambulatory Visit
Admission: RE | Admit: 2011-09-15 | Discharge: 2011-09-15 | Disposition: A | Discharge status: Home / Self Care | Payer: Medicaid Other | Source: Ambulatory Visit | Attending: Physical Medicine and Rehabilitation | Admitting: Physical Medicine and Rehabilitation

## 2011-09-15 DIAGNOSIS — M549 Dorsalgia, unspecified: Secondary | ICD-10-CM

## 2011-09-15 HISTORY — DX: Essential (primary) hypertension: I10

## 2011-09-15 MED ORDER — KETOROLAC TROMETHAMINE 30 MG/ML IJ SOLN
30.0000 mg | Freq: Once | INTRAMUSCULAR | Status: AC
  Administered 2011-09-15: 30 mg via INTRAVENOUS

## 2011-09-15 MED ORDER — MIDAZOLAM HCL 2 MG/2ML IJ SOLN
1.0000 mg | INTRAMUSCULAR | Status: DC | PRN
  Administered 2011-09-15 (×3): 1 mg via INTRAVENOUS

## 2011-09-15 MED ORDER — FENTANYL CITRATE 0.05 MG/ML IJ SOLN
25.0000 ug | INTRAMUSCULAR | Status: DC | PRN
  Administered 2011-09-15 (×2): 50 ug via INTRAVENOUS

## 2011-09-15 MED ORDER — CEFAZOLIN SODIUM 1-5 GM-% IV SOLN
1.0000 g | Freq: Three times a day (TID) | INTRAVENOUS | Status: DC
  Administered 2011-09-15: 1 g via INTRAVENOUS

## 2011-09-15 MED ORDER — SODIUM CHLORIDE 0.9 % IV SOLN
INTRAVENOUS | Status: DC
  Administered 2011-09-15: 08:00:00 via INTRAVENOUS

## 2011-09-15 NOTE — Patient Instructions (Addendum)
Radio frequency ablation Post Procedure Discharge Instructions  1. May resume a regular diet and any medications that you routinely take (including pain medications). 2. No driving day of procedure. 3. Upon discharge go home and rest for at least 4 hours.  May use an ice pack as needed to injection sites on back.    Please contact our office at [phone] for the following symptoms:   Fever greater than 100 degrees  Increased swelling, pain, or redness at injection site.  Remove bandades later today.  Follow up with your physician as needed.  Resume coumadin today.   Thank you for visiting [facility].

## 2011-09-15 NOTE — Progress Notes (Signed)
Pt off coumadin and INR 09/14/11 was 1.23. Discharge instructions explained and consent signed. 0855 sedation time is 38 minutes, pt maintained a 6 on steward sedation scale thru out the procedure. 1610 pt is comfortable at present.

## 2011-10-13 HISTORY — PX: BREAST BIOPSY: SHX20

## 2011-11-11 ENCOUNTER — Other Ambulatory Visit: Payer: Self-pay | Admitting: Orthopaedic Surgery

## 2011-11-11 DIAGNOSIS — M76899 Other specified enthesopathies of unspecified lower limb, excluding foot: Secondary | ICD-10-CM

## 2011-11-17 ENCOUNTER — Inpatient Hospital Stay: Admission: RE | Admit: 2011-11-17 | Payer: Medicaid Other | Source: Ambulatory Visit

## 2011-11-29 ENCOUNTER — Ambulatory Visit
Admission: RE | Admit: 2011-11-29 | Discharge: 2011-11-29 | Disposition: A | Discharge status: Home / Self Care | Payer: Medicaid Other | Source: Ambulatory Visit | Attending: Orthopaedic Surgery | Admitting: Orthopaedic Surgery

## 2011-11-29 DIAGNOSIS — M76899 Other specified enthesopathies of unspecified lower limb, excluding foot: Secondary | ICD-10-CM

## 2011-12-01 ENCOUNTER — Other Ambulatory Visit: Payer: Self-pay | Admitting: Orthopaedic Surgery

## 2011-12-01 DIAGNOSIS — M545 Low back pain, unspecified: Secondary | ICD-10-CM

## 2011-12-03 ENCOUNTER — Inpatient Hospital Stay: Admission: RE | Admit: 2011-12-03 | Payer: Medicaid Other | Source: Ambulatory Visit

## 2011-12-11 ENCOUNTER — Ambulatory Visit
Admission: RE | Admit: 2011-12-11 | Discharge: 2011-12-11 | Disposition: A | Discharge status: Home / Self Care | Payer: Medicaid Other | Source: Ambulatory Visit | Attending: Orthopaedic Surgery | Admitting: Orthopaedic Surgery

## 2011-12-11 DIAGNOSIS — M545 Low back pain, unspecified: Secondary | ICD-10-CM

## 2011-12-11 MED ORDER — GADOBENATE DIMEGLUMINE 529 MG/ML IV SOLN
20.0000 mL | Freq: Once | INTRAVENOUS | Status: AC | PRN
  Administered 2011-12-11: 20 mL via INTRAVENOUS

## 2011-12-30 ENCOUNTER — Encounter (INDEPENDENT_AMBULATORY_CARE_PROVIDER_SITE_OTHER): Payer: Medicaid Other | Admitting: Obstetrics and Gynecology

## 2011-12-30 DIAGNOSIS — N83209 Unspecified ovarian cyst, unspecified side: Secondary | ICD-10-CM

## 2012-01-12 ENCOUNTER — Other Ambulatory Visit (INDEPENDENT_AMBULATORY_CARE_PROVIDER_SITE_OTHER): Payer: Medicaid Other

## 2012-01-12 ENCOUNTER — Encounter (INDEPENDENT_AMBULATORY_CARE_PROVIDER_SITE_OTHER): Payer: Medicaid Other | Admitting: Obstetrics and Gynecology

## 2012-01-12 DIAGNOSIS — N83209 Unspecified ovarian cyst, unspecified side: Secondary | ICD-10-CM

## 2012-01-22 ENCOUNTER — Encounter (HOSPITAL_COMMUNITY): Payer: Self-pay | Admitting: *Deleted

## 2012-01-22 ENCOUNTER — Emergency Department (HOSPITAL_COMMUNITY): Payer: Medicaid Other

## 2012-01-22 ENCOUNTER — Emergency Department (HOSPITAL_COMMUNITY)
Admission: EM | Admit: 2012-01-22 | Discharge: 2012-01-22 | Disposition: A | Discharge status: Home / Self Care | Payer: Medicaid Other | Attending: Emergency Medicine | Admitting: Emergency Medicine

## 2012-01-22 DIAGNOSIS — R05 Cough: Secondary | ICD-10-CM | POA: Insufficient documentation

## 2012-01-22 DIAGNOSIS — E7800 Pure hypercholesterolemia, unspecified: Secondary | ICD-10-CM | POA: Insufficient documentation

## 2012-01-22 DIAGNOSIS — R079 Chest pain, unspecified: Secondary | ICD-10-CM | POA: Insufficient documentation

## 2012-01-22 DIAGNOSIS — I10 Essential (primary) hypertension: Secondary | ICD-10-CM | POA: Insufficient documentation

## 2012-01-22 DIAGNOSIS — Z79899 Other long term (current) drug therapy: Secondary | ICD-10-CM | POA: Insufficient documentation

## 2012-01-22 DIAGNOSIS — K219 Gastro-esophageal reflux disease without esophagitis: Secondary | ICD-10-CM | POA: Insufficient documentation

## 2012-01-22 DIAGNOSIS — R059 Cough, unspecified: Secondary | ICD-10-CM | POA: Insufficient documentation

## 2012-01-22 DIAGNOSIS — Z86718 Personal history of other venous thrombosis and embolism: Secondary | ICD-10-CM | POA: Insufficient documentation

## 2012-01-22 HISTORY — DX: Gastro-esophageal reflux disease without esophagitis: K21.9

## 2012-01-22 HISTORY — DX: Pure hypercholesterolemia, unspecified: E78.00

## 2012-01-22 HISTORY — DX: Acute embolism and thrombosis of unspecified deep veins of unspecified lower extremity: I82.409

## 2012-01-22 LAB — BASIC METABOLIC PANEL
BUN: 6 mg/dL (ref 6–23)
Creatinine, Ser: 0.71 mg/dL (ref 0.50–1.10)
GFR calc Af Amer: >90 mL/min (ref >90)
GFR calc non Af Amer: >90 mL/min (ref >90)
Glucose, Bld: 158 mg/dL — ABNORMAL HIGH (ref 70–99)

## 2012-01-22 LAB — CBC
HCT: 35.7 % — ABNORMAL LOW (ref 36.0–46.0)
MCHC: 34.5 g/dL (ref 30.0–36.0)
MCV: 82.4 fL (ref 78.0–100.0)
RDW: 14.7 % (ref 11.5–15.5)

## 2012-01-22 MED ORDER — MORPHINE SULFATE 4 MG/ML IJ SOLN
6.0000 mg | Freq: Once | INTRAMUSCULAR | Status: AC
  Administered 2012-01-22: 6 mg via INTRAVENOUS
  Filled 2012-01-22: qty 2

## 2012-01-22 MED ORDER — POTASSIUM CHLORIDE CRYS ER 20 MEQ PO TBCR
40.0000 meq | EXTENDED_RELEASE_TABLET | Freq: Once | ORAL | Status: AC
  Administered 2012-01-22: 40 meq via ORAL
  Filled 2012-01-22: qty 2

## 2012-01-22 MED ORDER — OXYCODONE-ACETAMINOPHEN 5-325 MG PO TABS: 1.0000 | ORAL_TABLET | ORAL | Status: AC | PRN

## 2012-01-22 MED ORDER — IOHEXOL 350 MG/ML SOLN
100.0000 mL | Freq: Once | INTRAVENOUS | Status: AC | PRN
  Administered 2012-01-22: 100 mL via INTRAVENOUS

## 2012-01-22 NOTE — Discharge Instructions (Signed)
Chest Pain (Nonspecific) It is often hard to give a specific diagnosis for the cause of chest pain. There is always a chance that your pain could be related to something serious, such as a heart attack or a blood clot in the lungs. You need to follow up with your caregiver for further evaluation. CAUSES   Heartburn.   Pneumonia or bronchitis.   Anxiety or stress.   Inflammation around your heart (pericarditis) or lung (pleuritis or pleurisy).   A blood clot in the lung.   A collapsed lung (pneumothorax). It can develop suddenly on its own (spontaneous pneumothorax) or from injury (trauma) to the chest.   Shingles infection (herpes zoster virus).  The chest wall is composed of bones, muscles, and cartilage. Any of these can be the source of the pain.  The bones can be bruised by injury.   The muscles or cartilage can be strained by coughing or overwork.   The cartilage can be affected by inflammation and become sore (costochondritis).  DIAGNOSIS  Lab tests or other studies, such as X-rays, electrocardiography, stress testing, or cardiac imaging, may be needed to find the cause of your pain.  TREATMENT   Treatment depends on what may be causing your chest pain. Treatment may include:   Acid blockers for heartburn.   Anti-inflammatory medicine.   Pain medicine for inflammatory conditions.   Antibiotics if an infection is present.   You may be advised to change lifestyle habits. This includes stopping smoking and avoiding alcohol, caffeine, and chocolate.   You may be advised to keep your head raised (elevated) when sleeping. This reduces the chance of acid going backward from your stomach into your esophagus.   Most of the time, nonspecific chest pain will improve within 2 to 3 days with rest and mild pain medicine.  HOME CARE INSTRUCTIONS   If antibiotics were prescribed, take your antibiotics as directed. Finish them even if you start to feel better.   For the next few  days, avoid physical activities that bring on chest pain. Continue physical activities as directed.   Do not smoke.   Avoid drinking alcohol.   Only take over-the-counter or prescription medicine for pain, discomfort, or fever as directed by your caregiver.   Follow your caregiver's suggestions for further testing if your chest pain does not go away.   Keep any follow-up appointments you made. If you do not go to an appointment, you could develop lasting (chronic) problems with pain. If there is any problem keeping an appointment, you must call to reschedule.  SEEK MEDICAL CARE IF:   You think you are having problems from the medicine you are taking. Read your medicine instructions carefully.   Your chest pain does not go away, even after treatment.   You develop a rash with blisters on your chest.  SEEK IMMEDIATE MEDICAL CARE IF:   You have increased chest pain or pain that spreads to your arm, neck, jaw, back, or abdomen.   You develop shortness of breath, an increasing cough, or you are coughing up blood.   You have severe back or abdominal pain, feel nauseous, or vomit.   You develop severe weakness, fainting, or chills.   You have a fever.  THIS IS AN EMERGENCY. Do not wait to see if the pain will go away. Get medical help at once. Call your local emergency services (911 in [place] Do not drive yourself to the hospital. MAKE SURE YOU:   Understand these instructions.     Will watch your condition.   Will get help right away if you are not doing well or get worse.  Document Released: 07/08/2005 Document Revised: 09/17/2011 Document Reviewed: 05/03/2008 [employer] Patient Information 2012 [employer]. 

## 2012-01-22 NOTE — ED Provider Notes (Signed)
History     CSN: [id number]  Arrival date & time 01/22/12  1002   First MD Initiated Contact with Patient 01/22/12 1019      Chief Complaint  Patient presents with  . Chest Pain     The history is provided by the patient.   patient reports developing right-sided pleuritic chest pain last night.  She's currently just taken off Coumadin for history of DVT.  She was started on the Coumadin one year ago for a lower [place] he DVT and she was taken off of this 8 days ago.  She denies shortness of breath.  She's had no unilateral leg swelling.  She denies fevers or chills.  She does report she's had cough.  Her chest pain is worse with deep breathing.  Her pain is mild to moderate at this time.  Nothing improves her symptoms.  Her pain is been constant and nonradiating  Past Medical History  Diagnosis Date  . Hypertension 4 months  . Hypercholesteremia   . GERD (gastroesophageal reflux disease)   . DVT (deep venous thrombosis)     Past Surgical History  Procedure Date  . Rotator cuff repair 2009    bilaterally  . Abdominal hysterectomy 2008  . Ectopic pregnancy surgery 1980's    No family history on file.  History  Substance Use Topics  . Smoking status: Never Smoker   . Smokeless tobacco: Not on file  . Alcohol Use: No    OB History    Grav Para Term Preterm Abortions TAB SAB Ect Mult Living                  Review of Systems  Cardiovascular: Positive for chest pain.  All other systems reviewed and are negative.    Allergies  Dilaudid and Suboxone  Home Medications   Current Outpatient Rx  Name Route Sig Dispense Refill  . CYCLOBENZAPRINE HCL 10 MG PO TABS Oral Take 10 mg by mouth daily.    Marland Kitchen GEMFIBROZIL 600 MG PO TABS Oral Take 600 mg by mouth 2 (two) times daily before a meal. In the morning and in the evening    . HYDROCHLOROTHIAZIDE 12.5 MG PO CAPS Oral Take 12.5 mg by mouth daily.    Marland Kitchen HYDROCODONE-ACETAMINOPHEN 7.5-750 MG PO TABS Oral Take 1 tablet by  mouth every 8 (eight) hours as needed. For pain    . OMEPRAZOLE 20 MG PO CPDR Oral Take 20 mg by mouth 2 (two) times daily before a meal.    . PAROXETINE HCL 20 MG PO TABS Oral Take 20 mg by mouth every morning.    Marland Kitchen TRAMADOL HCL 50 MG PO TABS Oral Take 50 mg by mouth every 8 (eight) hours as needed. For pain    . OXYCODONE-ACETAMINOPHEN 5-325 MG PO TABS Oral Take 1 tablet by mouth every 4 (four) hours as needed for pain. 20 tablet 0  . WARFARIN SODIUM 5 MG PO TABS Oral Take 5-7.5 mg by mouth See admin instructions. Take 1 & 1/2 tablets every day except on Mon, Wed, Fri take 1 tab      BP 100/62  Pulse 74  Temp(Src) 98.7 F (37.1 C) (Oral)  Resp 18  SpO2 100%  Physical Exam  Nursing note and vitals reviewed. Constitutional: She is oriented to person, place, and time. She appears well-developed and well-nourished. No distress.  HENT:  Head: Normocephalic and atraumatic.  Eyes: EOM are normal.  Neck: Normal range of motion.  Cardiovascular: Normal rate,  regular rhythm and normal heart sounds.   Pulmonary/Chest: Effort normal and breath sounds normal.       No tenderness over right anterior chest wall  Abdominal: Soft. She exhibits no distension. There is no tenderness.       Morbidly obese  Musculoskeletal: Normal range of motion.  Neurological: She is alert and oriented to person, place, and time.  Skin: Skin is warm and dry.  Psychiatric: She has a normal mood and affect. Judgment normal.    ED Course  Procedures (including critical care time)   Date: 01/22/2012  Rate: 82  Rhythm: normal sinus rhythm  QRS Axis: normal  Intervals: normal  ST/T Wave abnormalities: normal  Conduction Disutrbances: none  Narrative Interpretation:   Old EKG Reviewed: No significant changes noted     Labs Reviewed  CBC - Abnormal; Notable for the following:    HCT 35.7 (*)    All other components within normal limits  BASIC METABOLIC PANEL - Abnormal; Notable for the following:     Potassium 2.6 (*)    Chloride 94 (*)    Glucose, Bld 158 (*)    All other components within normal limits  D-DIMER, QUANTITATIVE - Abnormal; Notable for the following:    D-Dimer, Quant 0.69 (*)    All other components within normal limits  TROPONIN I   Dg Chest 2 View  01/22/2012  *RADIOLOGY REPORT*  Clinical Data: Chest pain, hypertension, cough.  CHEST - 2 VIEW  Comparison: 01/15/2010  Findings: Heart is borderline in size.  Lungs are clear.  No effusions.  No acute bony abnormality.  IMPRESSION: No active disease.  Original Report Authenticated By: Cyndie Chime, M.D.   Ct Angio Chest W/cm &/or Wo Cm  01/22/2012  *RADIOLOGY REPORT*  Clinical Data: 51 year old female with chest pain, back pain and elevated D-dimer.  CT ANGIOGRAPHY CHEST  Technique:  Multidetector CT imaging of the chest using the standard protocol during bolus administration of intravenous contrast. Multiplanar reconstructed images including MIPs were obtained and reviewed to evaluate the vascular anatomy.  Contrast: OMNIPAQUE IOHEXOL 350 MG/ML SOLN  Comparison: 01/22/2012 chest radiograph  Findings: This is a technically adequate study but suboptimal contrast opacification of the subsegmental arteries is noted.  No pulmonary emboli are identified.  The heart and great vessels are within normal limits. There is no evidence of thoracic aortic aneurysm.  No pleural or pericardial effusions are identified. No enlarged lymph nodes are noted.  There is no evidence of airspace disease, consolidation, suspicious nodule, mass or endobronchial/endotracheal lesion.  No acute or suspicious bony abnormalities are identified.  IMPRESSION: No evidence of significant abnormality - no evidence of pulmonary emboli.  Original Report Authenticated By: Rosendo Gros, M.D.   I personally reviewed the patient's imaging  1. Chest pain       MDM  [name] chest pain has been constant.  Her EKG and troponin are normal.  A mild elevation  in her d-dimer and thus a CT scan of her chest was performed which demonstrates no evidence of a pulmonary embolism.  She reports her pain is much improved at this time after pain medicine.  DC home with a short course of pain medicine and primary care followup.  She understands to return to the ER for new or worsening symptoms        Lyanne Co, MD 01/22/12 1622

## 2012-01-22 NOTE — ED Notes (Signed)
Patient transported to CT 

## 2012-01-22 NOTE — ED Notes (Signed)
Pt reports being on bloodthinners for past yr for dvt. Was taken off April 4.

## 2012-01-22 NOTE — ED Notes (Signed)
Pt reports chest pain that began last night. Constant with intermittent sharp shooting pains. Worse with deep breath.

## 2012-01-22 NOTE — ED Notes (Signed)
In to check on pt...resting more comfortably now

## 2012-02-29 ENCOUNTER — Other Ambulatory Visit: Payer: Self-pay | Admitting: Orthopaedic Surgery

## 2012-02-29 DIAGNOSIS — M7989 Other specified soft tissue disorders: Secondary | ICD-10-CM

## 2012-03-04 ENCOUNTER — Other Ambulatory Visit (HOSPITAL_COMMUNITY): Payer: Self-pay | Admitting: Family Medicine

## 2012-03-04 DIAGNOSIS — R200 Anesthesia of skin: Secondary | ICD-10-CM

## 2012-03-04 DIAGNOSIS — R531 Weakness: Secondary | ICD-10-CM

## 2012-03-04 DIAGNOSIS — R52 Pain, unspecified: Secondary | ICD-10-CM

## 2012-03-08 ENCOUNTER — Other Ambulatory Visit: Payer: Self-pay | Admitting: Family Medicine

## 2012-03-08 DIAGNOSIS — R531 Weakness: Secondary | ICD-10-CM

## 2012-03-10 ENCOUNTER — Other Ambulatory Visit (HOSPITAL_COMMUNITY): Payer: Medicaid Other

## 2012-03-10 ENCOUNTER — Other Ambulatory Visit: Payer: Medicaid Other

## 2012-03-14 ENCOUNTER — Inpatient Hospital Stay: Admission: RE | Admit: 2012-03-14 | Payer: Medicaid Other | Source: Ambulatory Visit

## 2012-03-23 ENCOUNTER — Encounter: Payer: Medicaid Other | Attending: Family Medicine | Admitting: *Deleted

## 2012-03-23 VITALS — Ht 66.0 in | Wt 309.3 lb

## 2012-03-23 DIAGNOSIS — E119 Type 2 diabetes mellitus without complications: Secondary | ICD-10-CM | POA: Insufficient documentation

## 2012-03-23 DIAGNOSIS — Z713 Dietary counseling and surveillance: Secondary | ICD-10-CM | POA: Insufficient documentation

## 2012-03-23 DIAGNOSIS — E785 Hyperlipidemia, unspecified: Secondary | ICD-10-CM | POA: Insufficient documentation

## 2012-04-03 ENCOUNTER — Encounter: Payer: Self-pay | Admitting: *Deleted

## 2012-04-03 NOTE — Progress Notes (Signed)
  Patient was seen on 03/23/12 for the first of a series of three diabetes self-management courses at the [facility]. The following learning objectives were met by the patient during this course:   Defines the role of glucose and insulin  Identifies type of diabetes and pathophysiology  Defines the diagnostic criteria for diabetes and prediabetes  States the risk factors for Type 2 Diabetes  States the symptoms of Type 2 Diabetes  Defines Type 2 Diabetes treatment goals  Defines Type 2 Diabetes treatment options  States the rationale for glucose monitoring  Identifies A1C, glucose targets, and testing times  Identifies proper sharps disposal  Defines the purpose of a diabetes food plan  Identifies carbohydrate food groups  Defines effects of carbohydrate foods on glucose levels  Identifies carbohydrate choices/grams/food labels  States benefits of physical activity and effect on glucose  Review of suggested activity guidelines  Lab Results  Component Value Date   HGBA1C 6.0 11/20/2009   Handouts given during class include:  Type 2 Diabetes: Basics Book  My Food Plan Book  Food and Activity Log  Patient has established the following initial goals:  Increase exercise.  Work on stress levels.  Lose weight.  Follow-Up Plan: Patient will attend Core Diabetes Courses II and III as scheduled or follow up prn.

## 2012-04-03 NOTE — Patient Instructions (Signed)
Attend Core Diabetes Courses II and III as scheduled or follow up prn.  

## 2012-04-12 ENCOUNTER — Other Ambulatory Visit (HOSPITAL_COMMUNITY): Payer: Self-pay | Admitting: Family Medicine

## 2012-04-12 DIAGNOSIS — R2230 Localized swelling, mass and lump, unspecified upper limb: Secondary | ICD-10-CM

## 2012-04-15 ENCOUNTER — Other Ambulatory Visit (HOSPITAL_COMMUNITY): Payer: Self-pay | Admitting: Family Medicine

## 2012-04-15 ENCOUNTER — Ambulatory Visit (HOSPITAL_COMMUNITY)
Admission: RE | Admit: 2012-04-15 | Discharge: 2012-04-15 | Disposition: A | Discharge status: Home / Self Care | Payer: Medicaid Other | Source: Ambulatory Visit | Attending: Family Medicine | Admitting: Family Medicine

## 2012-04-15 DIAGNOSIS — R2230 Localized swelling, mass and lump, unspecified upper limb: Secondary | ICD-10-CM

## 2012-04-15 DIAGNOSIS — R229 Localized swelling, mass and lump, unspecified: Secondary | ICD-10-CM | POA: Insufficient documentation

## 2012-04-15 DIAGNOSIS — Z1231 Encounter for screening mammogram for malignant neoplasm of breast: Secondary | ICD-10-CM

## 2012-04-15 DIAGNOSIS — Z0000 Encounter for general adult medical examination without abnormal findings: Secondary | ICD-10-CM

## 2012-05-05 ENCOUNTER — Ambulatory Visit (HOSPITAL_COMMUNITY)
Admission: RE | Admit: 2012-05-05 | Discharge: 2012-05-05 | Disposition: A | Discharge status: Home / Self Care | Payer: Medicaid Other | Source: Ambulatory Visit | Attending: Family Medicine | Admitting: Family Medicine

## 2012-05-05 ENCOUNTER — Other Ambulatory Visit (HOSPITAL_COMMUNITY): Payer: Self-pay | Admitting: Family Medicine

## 2012-05-05 ENCOUNTER — Encounter: Payer: Medicaid Other | Attending: Family Medicine | Admitting: *Deleted

## 2012-05-05 DIAGNOSIS — Z1231 Encounter for screening mammogram for malignant neoplasm of breast: Secondary | ICD-10-CM

## 2012-05-05 DIAGNOSIS — E119 Type 2 diabetes mellitus without complications: Secondary | ICD-10-CM | POA: Insufficient documentation

## 2012-05-05 DIAGNOSIS — E785 Hyperlipidemia, unspecified: Secondary | ICD-10-CM | POA: Insufficient documentation

## 2012-05-05 DIAGNOSIS — Z713 Dietary counseling and surveillance: Secondary | ICD-10-CM | POA: Insufficient documentation

## 2012-05-11 ENCOUNTER — Encounter: Payer: Self-pay | Admitting: *Deleted

## 2012-05-11 NOTE — Patient Instructions (Signed)
Goals:  Follow Diabetes Meal Plan as instructed  Eat 3 meals and 2 snacks, every 3-5 hrs  Limit carbohydrate intake to 30-45 grams carbohydrate/meal  Limit carbohydrate intake to 0-15 grams carbohydrate/snack  Add lean protein foods to meals/snacks  Monitor glucose levels as instructed by your doctor  Aim for 15-30 mins of physical activity daily  Bring food record and glucose log to your next nutrition visit   

## 2012-05-11 NOTE — Progress Notes (Signed)
  Patient was seen on 05/05/2012 for the second of a series of three diabetes self-management courses at the [facility]. The following learning objectives were met by the patient during this course:   Explain basic nutrition maintenance and quality assurance  Describe causes, symptoms and treatment of hypoglycemia and hyperglycemia  Explain how to manage diabetes during illness  Describe the importance of good nutrition for health and healthy eating strategies  List strategies to follow meal plan when dining out  Describe the effects of alcohol on glucose and how to use it safely  Describe problem solving skills for day-to-day glucose challenges  Describe strategies to use when treatment plan needs to change  Identify important factors involved in successful weight loss  Describe ways to remain physically active  Describe the impact of regular activity on insulin resistance  Handouts given in class:  Refrigerator magnet for Sick Day Guidelines  [facility] Oral medication/insulin handout  Follow-Up Plan: Patient will attend the final class of the ADA Diabetes Self-Care Education.    

## 2012-05-19 ENCOUNTER — Encounter: Payer: Medicaid Other | Attending: Family Medicine

## 2012-05-19 DIAGNOSIS — E785 Hyperlipidemia, unspecified: Secondary | ICD-10-CM | POA: Insufficient documentation

## 2012-05-19 DIAGNOSIS — Z713 Dietary counseling and surveillance: Secondary | ICD-10-CM | POA: Insufficient documentation

## 2012-05-19 DIAGNOSIS — E119 Type 2 diabetes mellitus without complications: Secondary | ICD-10-CM | POA: Insufficient documentation

## 2012-06-09 ENCOUNTER — Emergency Department (HOSPITAL_COMMUNITY)
Admission: EM | Admit: 2012-06-09 | Discharge: 2012-06-09 | Disposition: A | Discharge status: Home / Self Care | Payer: Medicaid Other | Attending: Emergency Medicine | Admitting: Emergency Medicine

## 2012-06-09 ENCOUNTER — Encounter (HOSPITAL_COMMUNITY): Payer: Self-pay | Admitting: *Deleted

## 2012-06-09 ENCOUNTER — Emergency Department (HOSPITAL_COMMUNITY): Payer: Medicaid Other

## 2012-06-09 DIAGNOSIS — Z79899 Other long term (current) drug therapy: Secondary | ICD-10-CM | POA: Insufficient documentation

## 2012-06-09 DIAGNOSIS — I4891 Unspecified atrial fibrillation: Secondary | ICD-10-CM

## 2012-06-09 DIAGNOSIS — E785 Hyperlipidemia, unspecified: Secondary | ICD-10-CM | POA: Insufficient documentation

## 2012-06-09 DIAGNOSIS — K219 Gastro-esophageal reflux disease without esophagitis: Secondary | ICD-10-CM | POA: Insufficient documentation

## 2012-06-09 DIAGNOSIS — R079 Chest pain, unspecified: Secondary | ICD-10-CM | POA: Insufficient documentation

## 2012-06-09 DIAGNOSIS — Z8673 Personal history of transient ischemic attack (TIA), and cerebral infarction without residual deficits: Secondary | ICD-10-CM | POA: Insufficient documentation

## 2012-06-09 DIAGNOSIS — R42 Dizziness and giddiness: Secondary | ICD-10-CM | POA: Insufficient documentation

## 2012-06-09 DIAGNOSIS — M25519 Pain in unspecified shoulder: Secondary | ICD-10-CM | POA: Insufficient documentation

## 2012-06-09 DIAGNOSIS — I10 Essential (primary) hypertension: Secondary | ICD-10-CM | POA: Insufficient documentation

## 2012-06-09 HISTORY — DX: Anxiety disorder, unspecified: F41.9

## 2012-06-09 HISTORY — DX: Other psychoactive substance abuse, uncomplicated: F19.10

## 2012-06-09 LAB — CBC WITH DIFFERENTIAL/PLATELET
Basophils Absolute: 0 10*3/uL (ref 0.0–0.1)
Basophils Relative: 0 % (ref 0–1)
Eosinophils Absolute: 0.1 10*3/uL (ref 0.0–0.7)
Eosinophils Relative: 2 % (ref 0–5)
HCT: 36.2 % (ref 36.0–46.0)
Hemoglobin: 12.5 g/dL (ref 12.0–15.0)
Lymphocytes Relative: 38 % (ref 12–46)
Lymphs Abs: 3.3 10*3/uL (ref 0.7–4.0)
MCH: 28.6 pg (ref 26.0–34.0)
MCHC: 34.5 g/dL (ref 30.0–36.0)
MCV: 82.8 fL (ref 78.0–100.0)
Monocytes Absolute: 0.5 10*3/uL (ref 0.1–1.0)
Monocytes Relative: 6 % (ref 3–12)
Neutro Abs: 4.5 10*3/uL (ref 1.7–7.7)
Neutrophils Relative %: 53 % (ref 43–77)
Platelets: 428 10*3/uL — ABNORMAL HIGH (ref 150–400)
RBC: 4.37 MIL/uL (ref 3.87–5.11)
RDW: 13.3 % (ref 11.5–15.5)
WBC: 8.5 10*3/uL (ref 4.0–10.5)

## 2012-06-09 LAB — POCT I-STAT TROPONIN I: Troponin i, poc: 0 ng/mL (ref 0.00–0.08)

## 2012-06-09 LAB — POCT I-STAT, CHEM 8
BUN: 5 mg/dL — ABNORMAL LOW (ref 6–23)
Hemoglobin: 13.6 g/dL (ref 12.0–15.0)
Potassium: 3.4 mEq/L — ABNORMAL LOW (ref 3.5–5.1)
Sodium: 141 mEq/L (ref 135–145)
TCO2: 28 mmol/L (ref 0–100)

## 2012-06-09 MED ORDER — NITROGLYCERIN 0.4 MG SL SUBL
0.4000 mg | SUBLINGUAL_TABLET | SUBLINGUAL | Status: AC | PRN
  Administered 2012-06-09 (×3): 0.4 mg via SUBLINGUAL
  Filled 2012-06-09: qty 25

## 2012-06-09 MED ORDER — HYDROCODONE-ACETAMINOPHEN 5-325 MG PO TABS
1.0000 | ORAL_TABLET | Freq: Once | ORAL | Status: AC
  Administered 2012-06-09: 1 via ORAL
  Filled 2012-06-09: qty 1

## 2012-06-09 NOTE — Consult Note (Signed)
HPI: 51 year-old female with no prior cardiac history for evaluation of question atrial fibrillation and chest pain. Patient has chronic mild dyspnea on exertion. No orthopnea or PND but occasional mild pedal edema. She does not have exertional chest pain. She was seen at her primary care physician's office 2 weeks ago by her report and was told she had an irregular heart beat. She returned for followup today. An electrocardiogram was obtained in the computer interpretation was atrial fibrillation. In reviewing it is actually sinus rhythm with PACs. She also describes chest pain. She had brief chest pain yesterday. It was in the left breast area and lasted 1 second and described as a sharp pain. She had more pain today for approximately 10 minutes. The pain increased with moving certain ways and lifting her arms above her head. It did not radiate. It was not pleuritic. Not related to food. No exertional component. No associated symptoms. Cardiology asked to evaluate.   (Not in a hospital admission)  Allergies  Allergen Reactions  . Buprenorphine Hcl-Naloxone Hcl Hives  . Dilaudid (Hydromorphone Hcl) Hives    Past Medical History  Diagnosis Date  . Hypertension 4 months  . Hypercholesteremia   . GERD (gastroesophageal reflux disease)   . DVT (deep venous thrombosis)   . Prediabetes   . Morbid obesity   . Anxiety   . Substance abuse     Past Surgical History  Procedure Date  . Rotator cuff repair 2009    bilaterally  . Abdominal hysterectomy 2008  . Ectopic pregnancy surgery 1980's  . Cholecystectomy   . Tonsillectomy     History   Social History  . Marital Status: Single    Spouse Name: N/A    Number of Children: N/A  . Years of Education: N/A   Occupational History  .      Disability   Social History Main Topics  . Smoking status: Never Smoker   . Smokeless tobacco: Not on file  . Alcohol Use: No     Previous ETOH abuse  . Drug Use: No     Previous crack use  .  Sexually Active: Not on file   Other Topics Concern  . Not on file   Social History Narrative  . No narrative on file    Family History  Problem Relation Age of Onset  . Heart disease      No family history    ROS:  Problems with occasional back pain and arthritis but no fevers or chills, productive cough, hemoptysis, dysphasia, odynophagia, melena, hematochezia, dysuria, hematuria, rash, seizure activity, orthopnea, PND, claudication. Remaining systems are negative.  Physical Exam:   Blood pressure 125/79, pulse 82, temperature 98.1 F (36.7 C), temperature source Oral, resp. rate 15, SpO2 99.00%.  General:  Well developed/obese in NAD Skin warm/dry Patient not depressed No peripheral clubbing Back-normal HEENT-normal/normal eyelids Neck supple/normal carotid upstroke bilaterally; no bruits; no JVD; no thyromegaly chest - CTA/ normal expansion CV - RRR/normal S1 and S2; no murmurs, rubs or gallops;  PMI nondisplaced Abdomen -NT/ND, no HSM, no mass, + bowel sounds, no bruit 2+ femoral pulses, no bruits Ext-no edema, chords, 2+ DP Neuro-grossly nonfocal  ECG sinus rhythm with occasional PACs and PVCs. No ST changes.  Results for orders placed during the hospital encounter of 06/09/12 (from the past 48 hour(s))  CBC WITH DIFFERENTIAL     Status: Abnormal   Collection Time   06/09/12 11:45 AM      Component Value Range  Comment   WBC 8.5  4.0 - 10.5 K/uL    RBC 4.37  3.87 - 5.11 MIL/uL    Hemoglobin 12.5  12.0 - 15.0 g/dL    HCT 98.1  19.1 - 47.8 %    MCV 82.8  78.0 - 100.0 fL    MCH 28.6  26.0 - 34.0 pg    MCHC 34.5  30.0 - 36.0 g/dL    RDW 29.5  62.1 - 30.8 %    Platelets 428 (*) 150 - 400 K/uL    Neutrophils Relative 53  43 - 77 %    Neutro Abs 4.5  1.7 - 7.7 K/uL    Lymphocytes Relative 38  12 - 46 %    Lymphs Abs 3.3  0.7 - 4.0 K/uL    Monocytes Relative 6  3 - 12 %    Monocytes Absolute 0.5  0.1 - 1.0 K/uL    Eosinophils Relative 2  0 - 5 %    Eosinophils  Absolute 0.1  0.0 - 0.7 K/uL    Basophils Relative 0  0 - 1 %    Basophils Absolute 0.0  0.0 - 0.1 K/uL   POCT I-STAT TROPONIN I     Status: Normal   Collection Time   06/09/12 11:54 AM      Component Value Range Comment   Troponin i, poc 0.00  0.00 - 0.08 ng/mL    Comment 3            POCT I-STAT, CHEM 8     Status: Abnormal   Collection Time   06/09/12 11:57 AM      Component Value Range Comment   Sodium 141  135 - 145 mEq/L    Potassium 3.4 (*) 3.5 - 5.1 mEq/L    Chloride 100  96 - 112 mEq/L    BUN 5 (*) 6 - 23 mg/dL    Creatinine, Ser 6.57  0.50 - 1.10 mg/dL    Glucose, Bld 846 (*) 70 - 99 mg/dL    Calcium, Ion 9.62  9.52 - 1.23 mmol/L    TCO2 28  0 - 100 mmol/L    Hemoglobin 13.6  12.0 - 15.0 g/dL    HCT 84.1  32.4 - 40.1 %     Dg Chest 2 View  06/09/2012  *RADIOLOGY REPORT*  Clinical Data: Left-sided chest pain.  Arrhythmia.  CHEST - 2 VIEW  Comparison: Two-view chest x-ray and CTA chest 01/22/2012.  Two- view chest x-ray 01/15/2010.  Findings: Cardiac silhouette normal in size, unchanged.  Thoracic aorta mildly tortuous and atherosclerotic, unchanged.  Hilar and mediastinal contours otherwise unremarkable.  Lungs clear. Bronchovascular markings normal.  Pulmonary vascularity normal.  No pneumothorax.  No pleural effusions.  Mild degenerative changes involving the thoracic spine.  No significant interval change.  IMPRESSION: No acute cardiopulmonary disease.  Stable examination.   Original Report Authenticated By: Arnell Sieving, M.D.     Assessment/Plan #1-atrial fibrillation-in reviewing the patient's electrocardiogram the rhythm is actually sinus rhythm with PACs. No further evaluation. Electrocardiogram otherwise normal. #2-chest pain-the patient symptoms are extremely atypical. They increase with certain movements and lifting her arms above her head. They are reproduced with palpating her chest. Symptoms most likely related to musculoskeletal pain. Electrocardiogram shows  no ST changes. Initial enzymes negative. No further ischemia evaluation recommended. #3-hyperlipidemia-continue statin and followup primary care. #4-hypertension-blood pressure controlled. Continue present medications. #5-hypokalemia-management per primary care. Continue supplementation and followup with her regular physician.  Olga Millers  MD 06/09/2012, 2:33 PM

## 2012-06-09 NOTE — ED Notes (Signed)
Pt c/o back pain from arthritis. PA notified and orders given for pain.

## 2012-06-09 NOTE — ED Notes (Signed)
Patient transported to X-ray 

## 2012-06-09 NOTE — ED Notes (Signed)
Bed:WA06<BR> Expected date:<BR> Expected time:<BR> Means of arrival:<BR> Comments:<BR> closed

## 2012-06-09 NOTE — ED Notes (Signed)
MD at bedside. 

## 2012-06-09 NOTE — ED Notes (Signed)
Cardiologist at bedside.  

## 2012-06-09 NOTE — ED Provider Notes (Signed)
History     CSN: [id number]  Arrival date & time 06/09/12  1103   First MD Initiated Contact with Patient 06/09/12 1128      Chief Complaint  Patient presents with  . Chest Pain    (Consider location/radiation/quality/duration/timing/severity/associated sxs/prior treatment) HPI Comments: 51 y/o female presents with sudden onset left sided chest pain x 2 days radiating to her shoulder. Describes pain as sharp and stabbing, constant. Had sob this morning. Did not try taking anything for her pain. She has never had chest pain like this before. Went to PCP 2 weeks ago who told her she had an irregular heartbeat, scheduled and EKG this morning which showed new onset afib, and was sent to ED. Admits to dizziness. Denies any palpitations, nausea, vomiting, diaphoresis, fever, chills, leg swelling, visual changes, confusion, cough, back pain, recent long car or plane rides. She is a non smoker. Has HTN and hyperlipidemia. No family hx of early heart disease. Uncle has hx of stroke.   Patient is a 51 y.o. female presenting with chest pain. The history is provided by the patient.  Chest Pain Primary symptoms include dizziness. Pertinent negatives for primary symptoms include no fever, no shortness of breath, no cough, no palpitations, no nausea and no vomiting.  Dizziness does not occur with nausea, vomiting or diaphoresis.  Pertinent negatives for associated symptoms include no diaphoresis.     Past Medical History  Diagnosis Date  . Hypertension 4 months  . Hypercholesteremia   . GERD (gastroesophageal reflux disease)   . DVT (deep venous thrombosis)   . Prediabetes   . Morbid obesity     Past Surgical History  Procedure Date  . Rotator cuff repair 2009    bilaterally  . Abdominal hysterectomy 2008  . Ectopic pregnancy surgery 1980's    History reviewed. No pertinent family history.  History  Substance Use Topics  . Smoking status: Never Smoker   . Smokeless tobacco: Not on  file  . Alcohol Use: No    OB History    Grav Para Term Preterm Abortions TAB SAB Ect Mult Living                  Review of Systems  Constitutional: Negative for fever, chills and diaphoresis.  Eyes: Negative for visual disturbance.  Respiratory: Negative for cough and shortness of breath.   Cardiovascular: Positive for chest pain. Negative for palpitations and leg swelling.  Gastrointestinal: Negative for nausea and vomiting.  Musculoskeletal: Negative for back pain.  Skin: Negative for color change.  Neurological: Positive for dizziness.  Psychiatric/Behavioral: Negative for confusion.    Allergies  Buprenorphine hcl-naloxone hcl and Dilaudid  Home Medications   Current Outpatient Rx  Name Route Sig Dispense Refill  . BUSPIRONE HCL 7.5 MG PO TABS Oral Take 7.5 mg by mouth daily.    . CYCLOBENZAPRINE HCL 10 MG PO TABS Oral Take 10 mg by mouth daily.    Marland Kitchen ESOMEPRAZOLE MAGNESIUM 40 MG PO CPDR Oral Take 40 mg by mouth daily before breakfast.    . HYDROCHLOROTHIAZIDE 12.5 MG PO CAPS Oral Take 12.5 mg by mouth daily.    Marland Kitchen HYDROCODONE-ACETAMINOPHEN 7.5-750 MG PO TABS Oral Take 1 tablet by mouth every 8 (eight) hours as needed. For pain    . PAROXETINE HCL 20 MG PO TABS Oral Take 40 mg by mouth daily.     Marland Kitchen POTASSIUM CHLORIDE CRYS ER 20 MEQ PO TBCR Oral Take 10 mEq by mouth 2 (two) times daily.    Marland Kitchen  PRAVASTATIN SODIUM 40 MG PO TABS Oral Take 40 mg by mouth daily.    . TRAMADOL HCL 50 MG PO TABS Oral Take 50 mg by mouth every 8 (eight) hours as needed. For pain      BP 139/75  Pulse 81  Temp 98.1 F (36.7 C) (Oral)  Resp 20  SpO2 99%  Physical Exam  Constitutional: She is oriented to person, place, and time. Vital signs are normal. She appears well-developed and well-nourished. No distress. Nasal cannula in place.  HENT:  Head: Normocephalic and atraumatic.  Mouth/Throat: Oropharynx is clear and moist.  Eyes: Conjunctivae are normal.  Neck: Normal range of motion. Neck  supple. No JVD present.  Cardiovascular: Normal rate and intact distal pulses.  An irregular rhythm present.       No extremity edema  Pulmonary/Chest: Effort normal and breath sounds normal. No accessory muscle usage. No respiratory distress. She has no decreased breath sounds. She has no wheezes. She has no rhonchi. She has no rales.  Abdominal: Soft. Bowel sounds are normal. There is no tenderness.  Musculoskeletal: Normal range of motion. She exhibits no edema.  Neurological: She is alert and oriented to person, place, and time.  Skin: Skin is warm and dry. She is not diaphoretic.  Psychiatric: She has a normal mood and affect. Her behavior is normal.    ED Course  Procedures (including critical care time)  Date: 06/09/2012  Rate: 78  Rhythm: sinus arrhythmia  QRS Axis: left  Intervals: short R-R  ST/T Wave abnormalities: normal  Conduction Disutrbances: multiple premature complexes  Narrative Interpretation: new onset a-fib, no stemi  Old EKG Reviewed: changes noted pvc's    Labs Reviewed  CBC WITH DIFFERENTIAL   Results for orders placed during the hospital encounter of 06/09/12  CBC WITH DIFFERENTIAL      Component Value Range   WBC 8.5  4.0 - 10.5 K/uL   RBC 4.37  3.87 - 5.11 MIL/uL   Hemoglobin 12.5  12.0 - 15.0 g/dL   HCT 16.1  09.6 - 04.5 %   MCV 82.8  78.0 - 100.0 fL   MCH 28.6  26.0 - 34.0 pg   MCHC 34.5  30.0 - 36.0 g/dL   RDW 40.9  81.1 - 91.4 %   Platelets 428 (*) 150 - 400 K/uL   Neutrophils Relative 53  43 - 77 %   Neutro Abs 4.5  1.7 - 7.7 K/uL   Lymphocytes Relative 38  12 - 46 %   Lymphs Abs 3.3  0.7 - 4.0 K/uL   Monocytes Relative 6  3 - 12 %   Monocytes Absolute 0.5  0.1 - 1.0 K/uL   Eosinophils Relative 2  0 - 5 %   Eosinophils Absolute 0.1  0.0 - 0.7 K/uL   Basophils Relative 0  0 - 1 %   Basophils Absolute 0.0  0.0 - 0.1 K/uL  POCT I-STAT, CHEM 8      Component Value Range   Sodium 141  135 - 145 mEq/L   Potassium 3.4 (*) 3.5 - 5.1 mEq/L     Chloride 100  96 - 112 mEq/L   BUN 5 (*) 6 - 23 mg/dL   Creatinine, Ser 7.82  0.50 - 1.10 mg/dL   Glucose, Bld 956 (*) 70 - 99 mg/dL   Calcium, Ion 2.13  0.86 - 1.23 mmol/L   TCO2 28  0 - 100 mmol/L   Hemoglobin 13.6  12.0 - 15.0 g/dL  HCT 40.0  36.0 - 46.0 %  POCT I-STAT TROPONIN I      Component Value Range   Troponin i, poc 0.00  0.00 - 0.08 ng/mL   Comment 3             Dg Chest 2 View  06/09/2012  *RADIOLOGY REPORT*  Clinical Data: Left-sided chest pain.  Arrhythmia.  CHEST - 2 VIEW  Comparison: Two-view chest x-ray and CTA chest 01/22/2012.  Two- view chest x-ray 01/15/2010.  Findings: Cardiac silhouette normal in size, unchanged.  Thoracic aorta mildly tortuous and atherosclerotic, unchanged.  Hilar and mediastinal contours otherwise unremarkable.  Lungs clear. Bronchovascular markings normal.  Pulmonary vascularity normal.  No pneumothorax.  No pleural effusions.  Mild degenerative changes involving the thoracic spine.  No significant interval change.  IMPRESSION: No acute cardiopulmonary disease.  Stable examination.   Original Report Authenticated By: Arnell Sieving, M.D.      1. Chest pain       MDM  51 y/o female with new onset chest pain. Also with chest pain relieved by nitro. Cardio is coming to consult patient for admit due to new onset chest pain. Case discussed with Dr. Jeraldine Loots who agrees with plan of care. 2:32 PM Cardio consulted patient and feels chest pain is not cardiac. He sees no evidence of afib. He is comfortable discharging her. Will discharge with close return precautions and cardio f/u.  Trevor Mace, PA-C 06/09/12 1436

## 2012-06-09 NOTE — ED Notes (Signed)
Pt c/o left sided sharp chest pain x's 2 days. Went to PCP's office this am and had EKG which showed ?a-fib. Pt tearful on assessment.

## 2012-06-09 NOTE — ED Notes (Signed)
Returned from XR 

## 2012-06-09 NOTE — ED Provider Notes (Signed)
Medical screening examination/treatment/procedure(s) were conducted as a shared visit with non-physician practitioner(s) and myself.  I personally evaluated the patient during the encounter On my exam this F w several days of cp, was in no distress, w no CP following provision of meds.  [name] W/U was largely reassuring, but w new CP, reports of Afib at PCP, although the ED ECG did not demonstrate this, cardiology was consulted.  Patient d/c in stable condition. Cardiac: 75 sr, normal O2: 99% ra, normal I agree with the ECG interpretation.  Gerhard Munch, MD 06/09/12 [phone]

## 2012-06-13 ENCOUNTER — Emergency Department (HOSPITAL_COMMUNITY): Payer: Medicaid Other

## 2012-06-13 ENCOUNTER — Emergency Department (HOSPITAL_COMMUNITY)
Admission: EM | Admit: 2012-06-13 | Discharge: 2012-06-13 | Disposition: A | Discharge status: Home / Self Care | Payer: Medicaid Other | Attending: Emergency Medicine | Admitting: Emergency Medicine

## 2012-06-13 ENCOUNTER — Encounter (HOSPITAL_COMMUNITY): Payer: Self-pay | Admitting: *Deleted

## 2012-06-13 DIAGNOSIS — I10 Essential (primary) hypertension: Secondary | ICD-10-CM | POA: Insufficient documentation

## 2012-06-13 DIAGNOSIS — Z86718 Personal history of other venous thrombosis and embolism: Secondary | ICD-10-CM | POA: Insufficient documentation

## 2012-06-13 DIAGNOSIS — R079 Chest pain, unspecified: Secondary | ICD-10-CM

## 2012-06-13 DIAGNOSIS — E7800 Pure hypercholesterolemia, unspecified: Secondary | ICD-10-CM | POA: Insufficient documentation

## 2012-06-13 DIAGNOSIS — Z79899 Other long term (current) drug therapy: Secondary | ICD-10-CM | POA: Insufficient documentation

## 2012-06-13 LAB — POCT I-STAT, CHEM 8
BUN: 5 mg/dL — ABNORMAL LOW (ref 6–23)
Calcium, Ion: 1.16 mmol/L (ref 1.12–1.23)
Chloride: 102 mEq/L (ref 96–112)
Glucose, Bld: 94 mg/dL (ref 70–99)

## 2012-06-13 LAB — POCT I-STAT TROPONIN I: Troponin i, poc: 0 ng/mL (ref 0.00–0.08)

## 2012-06-13 LAB — CBC
MCH: 28.6 pg (ref 26.0–34.0)
MCV: 83 fL (ref 78.0–100.0)
Platelets: 417 10*3/uL — ABNORMAL HIGH (ref 150–400)
RDW: 13.5 % (ref 11.5–15.5)
WBC: 9.5 10*3/uL (ref 4.0–10.5)

## 2012-06-13 LAB — PROTIME-INR: Prothrombin Time: 13.6 seconds (ref 11.6–15.2)

## 2012-06-13 MED ORDER — ASPIRIN 81 MG PO CHEW
324.0000 mg | CHEWABLE_TABLET | Freq: Once | ORAL | Status: AC
  Administered 2012-06-13: 324 mg via ORAL
  Filled 2012-06-13: qty 4

## 2012-06-13 MED ORDER — SODIUM CHLORIDE 0.9 % IV SOLN
1000.0000 mL | INTRAVENOUS | Status: DC
  Administered 2012-06-13: 1000 mL via INTRAVENOUS

## 2012-06-13 MED ORDER — IOHEXOL 350 MG/ML SOLN
100.0000 mL | Freq: Once | INTRAVENOUS | Status: AC | PRN
  Administered 2012-06-13: 100 mL via INTRAVENOUS

## 2012-06-13 MED ORDER — DIPHENHYDRAMINE HCL 50 MG/ML IJ SOLN
12.5000 mg | Freq: Once | INTRAMUSCULAR | Status: AC
  Administered 2012-06-13: 12.5 mg via INTRAVENOUS
  Filled 2012-06-13: qty 1

## 2012-06-13 MED ORDER — HYDROCODONE-ACETAMINOPHEN 5-325 MG PO TABS: 1.0000 | ORAL_TABLET | Freq: Four times a day (QID) | ORAL | Status: AC | PRN

## 2012-06-13 MED ORDER — MORPHINE SULFATE 4 MG/ML IJ SOLN
4.0000 mg | Freq: Once | INTRAMUSCULAR | Status: AC
  Administered 2012-06-13: 4 mg via INTRAVENOUS
  Filled 2012-06-13: qty 1

## 2012-06-13 NOTE — ED Provider Notes (Signed)
History     CSN: [id number]  Arrival date & time 06/13/12  1553   First MD Initiated Contact with Patient 06/13/12 1640      Chief Complaint  Patient presents with  . Chest Pain    Patient is a 51 y.o. female presenting with chest pain. The history is provided by the patient.  Chest Pain The chest pain began 1 - 2 weeks ago. Duration of episode(s) is 3 seconds. Chest pain occurs frequently. The chest pain is worsening. Associated with: nothing. At its most intense, the pain is at 10/10. The quality of the pain is described as sharp. The pain radiates to the upper back. Primary symptoms include shortness of breath and palpitations. Pertinent negatives for primary symptoms include no fever, no cough, no abdominal pain, no nausea and no vomiting.  The palpitations also occurred with shortness of breath.   Her past medical history is significant for DVT.  Pertinent negatives for past medical history include no CAD and no PE.  Procedure history is negative for cardiac catheterization.   Pt was seen in the ED last week for the same symptoms.  She was seen by [facility] cardiology.  They did not feel that the pain was cardiac in nature.  They looked at her EKG and she had SR with PAC but no a fib.  Past Medical History  Diagnosis Date  . Hypertension 4 months  . Hypercholesteremia   . GERD (gastroesophageal reflux disease)   . DVT (deep venous thrombosis)   . Prediabetes   . Morbid obesity   . Anxiety   . Substance abuse     Past Surgical History  Procedure Date  . Rotator cuff repair 2009    bilaterally  . Abdominal hysterectomy 2008  . Ectopic pregnancy surgery 1980's  . Cholecystectomy   . Tonsillectomy     Family History  Problem Relation Age of Onset  . Heart disease      No family history    History  Substance Use Topics  . Smoking status: Never Smoker   . Smokeless tobacco: Not on file  . Alcohol Use: No     Previous ETOH abuse    OB History    Grav Para Term  Preterm Abortions TAB SAB Ect Mult Living                  Review of Systems  Constitutional: Negative for fever.  Respiratory: Positive for shortness of breath. Negative for cough.   Cardiovascular: Positive for chest pain and palpitations.  Gastrointestinal: Negative for nausea, vomiting and abdominal pain.  All other systems reviewed and are negative.    Allergies  Buprenorphine hcl-naloxone hcl and Dilaudid  Home Medications   Current Outpatient Rx  Name Route Sig Dispense Refill  . ALLOPURINOL 100 MG PO TABS Oral Take 100 mg by mouth daily.    . BUSPIRONE HCL 7.5 MG PO TABS Oral Take 7.5 mg by mouth daily.    . CYCLOBENZAPRINE HCL 10 MG PO TABS Oral Take 10 mg by mouth daily.    Marland Kitchen ESOMEPRAZOLE MAGNESIUM 40 MG PO CPDR Oral Take 40 mg by mouth daily before breakfast.    . HYDROCHLOROTHIAZIDE 12.5 MG PO CAPS Oral Take 12.5 mg by mouth daily.    Marland Kitchen HYDROCODONE-ACETAMINOPHEN 7.5-750 MG PO TABS Oral Take 1 tablet by mouth every 8 (eight) hours as needed. For pain    . PAROXETINE HCL 40 MG PO TABS Oral Take 40 mg by mouth every  morning.    Marland Kitchen POTASSIUM CHLORIDE CRYS ER 20 MEQ PO TBCR Oral Take 10 mEq by mouth 2 (two) times daily. Take 1/2 of tablet (10 MEQ total) twice a day    . PRAVASTATIN SODIUM 40 MG PO TABS Oral Take 40 mg by mouth daily.      BP 117/92  Pulse 87  Temp 98.4 F (36.9 C) (Oral)  Resp 14  SpO2 98%  Physical Exam  Nursing note and vitals reviewed. Constitutional: She appears well-developed and well-nourished. No distress.  HENT:  Head: Normocephalic and atraumatic.  Right Ear: External ear normal.  Left Ear: External ear normal.  Eyes: Conjunctivae are normal. Right eye exhibits no discharge. Left eye exhibits no discharge. No scleral icterus.  Neck: Neck supple. No tracheal deviation present.  Cardiovascular: Normal rate, regular rhythm and intact distal pulses.   Pulmonary/Chest: Effort normal and breath sounds normal. No stridor. No respiratory  distress. She has no wheezes. She has no rales.  Abdominal: Soft. Bowel sounds are normal. She exhibits no distension. There is no tenderness. There is no rebound and no guarding.  Musculoskeletal: She exhibits no edema and no tenderness.  Neurological: She is alert. She has normal strength. No sensory deficit. Cranial nerve deficit:  no gross defecits noted. She exhibits normal muscle tone. She displays no seizure activity. Coordination normal.  Skin: Skin is warm and dry. No rash noted.  Psychiatric: She has a normal mood and affect.    ED Course  Procedures (including critical care time)  Rate: 57  Rhythm: normal sinus rhythm  QRS Axis: normal  Intervals: normal  ST/T Wave abnormalities: normal  Conduction Disutrbances:none  Narrative Interpretation:   Old EKG Reviewed: none available  Labs Reviewed  CBC - Abnormal; Notable for the following:    HCT 35.7 (*)     Platelets 417 (*)     All other components within normal limits  POCT I-STAT, CHEM 8 - Abnormal; Notable for the following:    BUN 5 (*)     All other components within normal limits  POCT I-STAT TROPONIN I  PROTIME-INR  APTT  D-DIMER, QUANTITATIVE   Ct Angio Chest Pe W/cm &/or Wo Cm  06/13/2012  *RADIOLOGY REPORT*  Clinical Data: 50 year old female chest pain.  Intermittent shortness of breath.  CT ANGIOGRAPHY CHEST  Technique:  Multidetector CT imaging of the chest using the standard protocol during bolus administration of intravenous contrast. Multiplanar reconstructed images including MIPs were obtained and reviewed to evaluate the vascular anatomy.  Contrast: OMNIPAQUE IOHEXOL 350 MG/ML SOLN  Comparison: 01/22/2012.  Findings: Suboptimal but adequate contrast bolus timing in the pulmonary arterial tree.  Mild intermittent motion artifact.  No central pulmonary embolus.  No convincing pulmonary artery filling defect.  Stable patency of the major airways.  Decreased atelectasis today compared to prior.  No  pulmonary consolidation.  No abnormal pulmonary opacity.  Stable mild cardiomegaly.  No pericardial or pleural effusion. Stable and negative visualized upper abdominal viscera.  No mediastinal lymphadenopathy.  Negative distal descending thoracic and upper abdominal aorta.  Negative thoracic inlet.  No acute osseous abnormality identified.  IMPRESSION: No evidence of acute pulmonary embolus. No acute findings.   Original Report Authenticated By: Harley Hallmark, M.D.      MDM  Patient was moderate risk for pulmonary embolism considering her symptoms and her prior history of DVT. A CT scan was performed and fortunately did not show any evidence of aneurysm or pulmonary embolism to account for  her chest pain.  [name] symptoms are atypical for cardiac disease. She has had normal cardiac enzymes with her previous visit and the visit today. She was seen by cardiology in the last visit did not feel that she needed any specific cardiac workup. Is possible her symptoms are related to pleurisy. Patient be discharged home with medications for pain. Encouraged to return to emergency room as needed.  She plans to followup with her doctor tomorrow        Celene Kras, MD 06/13/12 1820

## 2012-06-13 NOTE — ED Notes (Signed)
Discontinued IV 

## 2012-06-13 NOTE — ED Notes (Addendum)
Pt reports that she was seen in ED for chest pain a few days ago. Was dx with "a fib" she believes and told to follow up with cardiologist, pt reports cardiologist is closed today. Pt reported pain subsided and then last night 9/1 pain started to return. Today 9/2 sharp left sided chest pain increased and got worse 10/10. Reports that it "feels like someone is punching her in the back" and pressure. Intermittent sob. Pt report prediabetic and hx of HTN.

## 2012-06-20 ENCOUNTER — Ambulatory Visit (HOSPITAL_COMMUNITY)
Admission: RE | Admit: 2012-06-20 | Discharge: 2012-06-20 | Disposition: A | Discharge status: Home / Self Care | Payer: Medicaid Other | Source: Ambulatory Visit | Attending: Family Medicine | Admitting: Family Medicine

## 2012-06-20 DIAGNOSIS — Z1231 Encounter for screening mammogram for malignant neoplasm of breast: Secondary | ICD-10-CM | POA: Insufficient documentation

## 2012-06-23 ENCOUNTER — Other Ambulatory Visit: Payer: Self-pay | Admitting: Family Medicine

## 2012-06-23 DIAGNOSIS — R928 Other abnormal and inconclusive findings on diagnostic imaging of breast: Secondary | ICD-10-CM

## 2012-06-24 ENCOUNTER — Telehealth: Payer: Self-pay | Admitting: Radiology

## 2012-06-24 NOTE — Telephone Encounter (Signed)
Dr Clelia Croft, your name is showing up on this patients Mammogram order, our referrals department is asking if we need to work on this. We do not participate with Medicaid and can not give Medicaid NPI numbers for the Mammogram order. I think this order should have Dr Reche Dixon name on it and not yours, please advise if anything needs to be done with this. It does not look like this is something you have done here.

## 2012-06-28 NOTE — Telephone Encounter (Signed)
Spoke w/pt who stated that she already had her mammogram done. Her new PCP did take care of ordering it. Pt thanked [place] for calling. I notified our Referral dept.

## 2012-06-28 NOTE — Telephone Encounter (Signed)
No - it should not go under Dr. Benjaman Lobe name unless she has transferred her care to his [facility] in [place].  Unfortunately, since my prior clinic was closed, she needs to have this transferred to whoever her new PCP is - she should certainly have one by now as she will have needed refills on her chronic meds.

## 2012-06-28 NOTE — Telephone Encounter (Signed)
Let me know if you want me to call the pt and have her contact her new PCP about this. Thanks.

## 2012-07-05 ENCOUNTER — Ambulatory Visit
Admission: RE | Admit: 2012-07-05 | Discharge: 2012-07-05 | Disposition: A | Discharge status: Home / Self Care | Payer: Medicaid Other | Source: Ambulatory Visit | Attending: Family Medicine | Admitting: Family Medicine

## 2012-07-05 ENCOUNTER — Other Ambulatory Visit: Payer: Self-pay | Admitting: Orthopaedic Surgery

## 2012-07-05 DIAGNOSIS — M7989 Other specified soft tissue disorders: Secondary | ICD-10-CM

## 2012-07-05 DIAGNOSIS — R928 Other abnormal and inconclusive findings on diagnostic imaging of breast: Secondary | ICD-10-CM

## 2012-07-07 ENCOUNTER — Other Ambulatory Visit: Payer: Self-pay

## 2012-07-11 ENCOUNTER — Inpatient Hospital Stay: Admission: RE | Admit: 2012-07-11 | Payer: Medicaid Other | Source: Ambulatory Visit

## 2012-07-12 HISTORY — PX: TRANSTHORACIC ECHOCARDIOGRAM: SHX275

## 2012-07-12 HISTORY — PX: NM MYOCAR PERF WALL MOTION: HXRAD629

## 2012-07-27 ENCOUNTER — Ambulatory Visit (HOSPITAL_BASED_OUTPATIENT_CLINIC_OR_DEPARTMENT_OTHER): Payer: Medicaid Other | Attending: Cardiovascular Disease

## 2012-07-27 VITALS — Ht 66.0 in | Wt 321.0 lb

## 2012-07-27 DIAGNOSIS — G4710 Hypersomnia, unspecified: Secondary | ICD-10-CM | POA: Insufficient documentation

## 2012-07-27 DIAGNOSIS — G4730 Sleep apnea, unspecified: Secondary | ICD-10-CM | POA: Insufficient documentation

## 2012-07-27 DIAGNOSIS — G4733 Obstructive sleep apnea (adult) (pediatric): Secondary | ICD-10-CM

## 2012-07-27 HISTORY — PX: OTHER SURGICAL HISTORY: SHX169

## 2012-07-30 DIAGNOSIS — G4730 Sleep apnea, unspecified: Secondary | ICD-10-CM

## 2012-07-30 DIAGNOSIS — G4710 Hypersomnia, unspecified: Secondary | ICD-10-CM

## 2012-07-30 NOTE — Procedures (Signed)
[name], [name] NO.:  [id number]  MEDICAL RECORD NO.:  [id number]          PATIENT TYPE:  OUT  LOCATION:  SLEEP CENTER                 FACILITY:  [facility]  PHYSICIAN:  Lagretta Loseke D. Maple Hudson, MD, FCCP, FACPDATE OF BIRTH:  27-[birth year]1960  DATE OF STUDY:  07/27/2012                           NOCTURNAL POLYSOMNOGRAM  REFERRING PHYSICIAN:  Richard A. Alanda Amass, M.D.  REFERRING PHYSICIAN:  Richard A. Alanda Amass, MD  INDICATION FOR STUDY:  Hypersomnia with sleep apnea.  EPWORTH SLEEPINESS SCORE:  10/24.  BMI 58, weight 321 pounds, height 66 inches, neck 18 inches.  MEDICATIONS:  Home medications are charted and reviewed.  SLEEP ARCHITECTURE:  Total sleep time 328 minutes with sleep efficiency 82.4%.  Stage I was 11%, stage II 88.3%, stage III 0.8%, REM absent. Sleep latency 20.5 minutes, REM latency NA.  Awake after sleep onset 49.5 minutes.  Arousal index 4.6.  Sleep pattern was marked by frequent brief waking throughout the night and persisted, returned to stage II sleep suggesting medication effect.  Bedtime medication:  Buspirone and pravastatin.  RESPIRATORY DATA:  Apnea/hypopnea index (AHI) 4.8 per hour.  A total of 26 events were scored including 1 obstructive apnea and 25 hypopneas. Events were seen in all sleep positions, mainly nonsupine.  There were insufficient numbers of events to permit application of split protocol, CPAP titration on the study night.  OXYGEN DATA:  Moderately loud snoring with oxygen desaturation to a nadir of 85% and mean oxygen saturation through the study of 95.3% on room air.  CARDIAC DATA:  Sinus rhythm with frequent PACs and some PVCs.  MOVEMENT/PARASOMNIA:  A total of 230 limb jerks were counted, of which 7 were associated with arousals or awakening for periodic limb movement with arousal index of 1.3 per hour.  Bathroom x1.  IMPRESSION/RECOMMENDATION: 1. Sleep architecture showed medication effect with persistent stage  II sleep interrupted by frequent nonspecific brief awakenings, only     some of which were related to respiratory events or limb jerks.     REM was absent. 2. Occasional respiratory events with sleep disturbance, within normal     limits.  AHI 4.8 per hour (the normal range for adults is from 0-5     events per hour).  Moderately loud snoring with oxygen desaturation     to a nadir of 85% and mean oxygen saturation through the study of     95.3% on room air.  A total of 1.9 minutes was recorded with room     air saturation less than 90% during the study. 3. Frequent limb jerks with some sleep disturbance.  A total of 230     limb jerks were counted, of which 7 were associated with arousal or     awakenings for periodic limb movements with arousal index of 7 per     hour.     Charly Hunton D. Maple Hudson, MD, [facility], FACP Diplomate, American Board of Sleep Medicine    CDY/MEDQ  D:  07/30/2012 12:00:48  T:  07/30/2012 13:30:56  Job:  [id number]

## 2012-10-08 ENCOUNTER — Emergency Department (HOSPITAL_COMMUNITY): Payer: Medicaid Other

## 2012-10-08 ENCOUNTER — Emergency Department (HOSPITAL_COMMUNITY)
Admission: EM | Admit: 2012-10-08 | Discharge: 2012-10-08 | Disposition: A | Discharge status: Home / Self Care | Payer: Medicaid Other | Attending: Emergency Medicine | Admitting: Emergency Medicine

## 2012-10-08 ENCOUNTER — Encounter (HOSPITAL_COMMUNITY): Payer: Self-pay | Admitting: Emergency Medicine

## 2012-10-08 DIAGNOSIS — E7800 Pure hypercholesterolemia, unspecified: Secondary | ICD-10-CM | POA: Insufficient documentation

## 2012-10-08 DIAGNOSIS — Z86718 Personal history of other venous thrombosis and embolism: Secondary | ICD-10-CM | POA: Insufficient documentation

## 2012-10-08 DIAGNOSIS — M7989 Other specified soft tissue disorders: Secondary | ICD-10-CM | POA: Insufficient documentation

## 2012-10-08 DIAGNOSIS — Z79899 Other long term (current) drug therapy: Secondary | ICD-10-CM | POA: Insufficient documentation

## 2012-10-08 DIAGNOSIS — M5380 Other specified dorsopathies, site unspecified: Secondary | ICD-10-CM | POA: Insufficient documentation

## 2012-10-08 DIAGNOSIS — M62830 Muscle spasm of back: Secondary | ICD-10-CM

## 2012-10-08 DIAGNOSIS — I10 Essential (primary) hypertension: Secondary | ICD-10-CM | POA: Insufficient documentation

## 2012-10-08 DIAGNOSIS — F411 Generalized anxiety disorder: Secondary | ICD-10-CM | POA: Insufficient documentation

## 2012-10-08 DIAGNOSIS — K219 Gastro-esophageal reflux disease without esophagitis: Secondary | ICD-10-CM | POA: Insufficient documentation

## 2012-10-08 DIAGNOSIS — F1910 Other psychoactive substance abuse, uncomplicated: Secondary | ICD-10-CM | POA: Insufficient documentation

## 2012-10-08 LAB — URINALYSIS, ROUTINE W REFLEX MICROSCOPIC
Bilirubin Urine: NEGATIVE
Glucose, UA: NEGATIVE mg/dL
Hgb urine dipstick: NEGATIVE
Ketones, ur: NEGATIVE mg/dL
Nitrite: NEGATIVE
Protein, ur: NEGATIVE mg/dL
Specific Gravity, Urine: 1.017 (ref 1.005–1.030)
Urobilinogen, UA: 0.2 mg/dL (ref 0.0–1.0)
pH: 6 (ref 5.0–8.0)

## 2012-10-08 LAB — URINE MICROSCOPIC-ADD ON

## 2012-10-08 MED ORDER — DIAZEPAM 5 MG PO TABS
5.0000 mg | ORAL_TABLET | Freq: Once | ORAL | Status: AC
  Administered 2012-10-08: 5 mg via ORAL
  Filled 2012-10-08: qty 1

## 2012-10-08 MED ORDER — DIAZEPAM 5 MG PO TABS: 5.0000 mg | ORAL_TABLET | Freq: Three times a day (TID) | ORAL | Status: DC | PRN

## 2012-10-08 MED ORDER — MEPERIDINE HCL 100 MG PO TABS: 100.0000 mg | ORAL_TABLET | Freq: Four times a day (QID) | ORAL | Status: DC | PRN

## 2012-10-08 MED ORDER — MEPERIDINE HCL 50 MG/ML IJ SOLN
50.0000 mg | Freq: Once | INTRAMUSCULAR | Status: AC
  Administered 2012-10-08: 50 mg via INTRAMUSCULAR
  Filled 2012-10-08: qty 1

## 2012-10-08 NOTE — ED Provider Notes (Signed)
History     CSN: [id number]  Arrival date & time 10/08/12  1107   First MD Initiated Contact with Patient 10/08/12 1320      Chief Complaint  Patient presents with  . Back Pain  . Leg Swelling    (Consider location/radiation/quality/duration/timing/severity/associated sxs/prior treatment) HPI Comments: Patient reports that she woke up about 2 days ago with low back pain that is right in the middle and goes across her low back. Pain does not radiate. She denies any distal numbness or weakness. She reports that she has been able to stand up and walk around but the pain is becoming more constant. She reports that she does bend over and try to straighten using her back muscles this does worsen the pain. She does take chronic hydrocodone 4 times a day for chronic joint pain and arthritis. She also will take Flexeril as needed mainly muscle spasms mainly around her shoulders. She also makes note of some bilateral lower extremity swelling. She denies any recent travel. She denies any chest pain, pleurisy, cough or shortness of breath. She reports that she has a remote history of a DVT but is no longer on Coumadin. She denies any specific unilateral flank pain, pelvic pain, dysuria or hematuria. She denies any abdominal pain, nausea or vomiting.  Patient is a 51 y.o. female presenting with back pain. The history is provided by the patient.  Back Pain  Pertinent negatives include no chest pain, no fever, no numbness, no abdominal pain and no weakness.    Past Medical History  Diagnosis Date  . Hypertension 4 months  . Hypercholesteremia   . GERD (gastroesophageal reflux disease)   . DVT (deep venous thrombosis)   . Prediabetes   . Morbid obesity   . Anxiety   . Substance abuse     Past Surgical History  Procedure Date  . Rotator cuff repair 2009    bilaterally  . Abdominal hysterectomy 2008  . Ectopic pregnancy surgery 1980's  . Cholecystectomy   . Tonsillectomy     Family  History  Problem Relation Age of Onset  . Heart disease      No family history    History  Substance Use Topics  . Smoking status: Never Smoker   . Smokeless tobacco: Not on file  . Alcohol Use: No     Comment: Previous ETOH abuse    OB History    Grav Para Term Preterm Abortions TAB SAB Ect Mult Living                  Review of Systems  Constitutional: Negative for fever and chills.  Respiratory: Negative for cough and shortness of breath.   Cardiovascular: Positive for leg swelling. Negative for chest pain and palpitations.  Gastrointestinal: Negative for nausea, vomiting and abdominal pain.  Musculoskeletal: Positive for back pain.  Skin: Negative for color change, pallor, rash and wound.  Neurological: Negative for weakness and numbness.  All other systems reviewed and are negative.    Allergies  Buprenorphine hcl-naloxone hcl and Dilaudid  Home Medications   Current Outpatient Rx  Name  Route  Sig  Dispense  Refill  . ALLOPURINOL 100 MG PO TABS   Oral   Take 100 mg by mouth daily.         . ATORVASTATIN CALCIUM 40 MG PO TABS   Oral   Take 40 mg by mouth daily.         . BUSPIRONE HCL 7.5 MG PO  TABS   Oral   Take 7.5 mg by mouth 2 (two) times daily.          . CYCLOBENZAPRINE HCL 10 MG PO TABS   Oral   Take 10 mg by mouth 3 (three) times daily as needed. Muscle spasms         . HYDROCHLOROTHIAZIDE 12.5 MG PO CAPS   Oral   Take 12.5 mg by mouth daily.         Marland Kitchen METOPROLOL SUCCINATE ER 25 MG PO TB24   Oral   Take 25 mg by mouth daily.         Marland Kitchen OMEPRAZOLE 40 MG PO CPDR   Oral   Take 40 mg by mouth daily.         . OXYCODONE-ACETAMINOPHEN 10-325 MG PO TABS   Oral   Take 1 tablet by mouth every 4 (four) hours as needed. PAIN         . POTASSIUM CHLORIDE CRYS ER 20 MEQ PO TBCR   Oral   Take 20 mEq by mouth daily.          . TRAMADOL HCL 50 MG PO TABS   Oral   Take 50 mg by mouth every 6 (six) hours as needed. pain           . DIAZEPAM 5 MG PO TABS   Oral   Take 1 tablet (5 mg total) by mouth every 8 (eight) hours as needed (muscle spasms).   12 tablet   0   . MEPERIDINE HCL 100 MG PO TABS   Oral   Take 1 tablet (100 mg total) by mouth every 6 (six) hours as needed for pain.   20 tablet   0     BP 124/79  Pulse 86  Temp 98.1 F (36.7 C) (Oral)  Resp 18  SpO2 98%  Physical Exam  Nursing note and vitals reviewed. Constitutional: She appears well-developed and well-nourished.       Patient is in mild to moderate distress secondary to pain in her lower back. She is not toxic in appearance. She is laying in bed watching television. Patient is morbidly obese with a listed weight of 321 pounds. No appreciable lower extremity edema or swelling  HENT:  Head: Normocephalic and atraumatic.  Eyes: Pupils are equal, round, and reactive to light. No scleral icterus.  Cardiovascular: Normal rate and regular rhythm.   Pulmonary/Chest: Effort normal. No respiratory distress. She has no wheezes.  Abdominal: Soft. She exhibits no distension. There is no tenderness.  Musculoskeletal:       Cervical back: Normal.       Thoracic back: Normal.       Lumbar back: She exhibits decreased range of motion and pain. She exhibits no tenderness, no bony tenderness, no swelling, no deformity, no laceration, no spasm and normal pulse.       Right lower leg: She exhibits no tenderness, no edema and no deformity.       Left lower leg: She exhibits no tenderness, no swelling, no edema and no deformity.  Neurological: She is alert.  Skin: Skin is warm and dry. No rash noted. No pallor.    ED Course  Procedures (including critical care time)  Labs Reviewed  URINALYSIS, ROUTINE W REFLEX MICROSCOPIC - Abnormal; Notable for the following:    APPearance HAZY (*)     Leukocytes, UA MODERATE (*)     All other components within normal limits  URINE MICROSCOPIC-ADD ON -  Abnormal; Notable for the following:    Squamous  Epithelial / LPF FEW (*)     Bacteria, UA FEW (*)     All other components within normal limits  URINE CULTURE   Dg Lumbar Spine 2-3 Views  10/08/2012  *RADIOLOGY REPORT*  Clinical Data: Low back pain.  No known injury.  LUMBAR SPINE - 2-3 VIEW  Comparison: None.  Findings: No evidence of lumbar spine fracture or subluxation. Mild degenerative disc disease is seen at levels of T12-L1, L1-2 and L4-5.  No other significant bone abnormality identified.  IMPRESSION:  1.  No acute findings. 2.  Mild degenerative disc disease.   Original Report Authenticated By: Myles Rosenthal, M.D.    I reviewed the lumbar plain films myself. I reviewed the radiologist's interpretation. No mass or tumor or fractures are seen.  1. Spasm of lumbar paraspinous muscle     Room air saturation is 98% and I interpret this to be normal.  2:53 PM I reviewed the plain films as indicated above. The patient reports the eye and Demerol and Valium didn't significantly improve her symptoms. She does have minimal evidence of possible urinary tract infection. However no significant dysuria or frequency. Will add a urine culture for more definitive diagnosis. Otherwise we'll give her prescriptions for stronger analgesics and Valium.  MDM   Patient is on Percocet 10 mg rather than hydrocodone the patient reported. Patient is over the age of 50 and despite having no trauma, will get plain films to rule out obvious mass. Otherwise she is reproducible back pain with movement. She has no focal neurologic deficits. She has no risk factors for DVT or PE other than her prior history. She's not tachycardic, hypoxic and there is no lower extremity edema and negative Homans signs bilaterally. Plan is to give her analgesics and muscle relaxants here for symptomatically improvement.        Gavin Pound. Kavian Peters, MD 10/08/12 1610

## 2012-10-08 NOTE — ED Notes (Addendum)
Patient transported to X-ray 

## 2012-10-08 NOTE — Discharge Instructions (Signed)
 Lumbosacral Strain Lumbosacral strain is one of the most common causes of back pain. There are many causes of back pain. Most are not serious conditions. CAUSES  Your backbone (spinal column) is made up of 24 main vertebral bodies, the sacrum, and the coccyx. These are held together by muscles and tough, fibrous tissue (ligaments). Nerve roots pass through the openings between the vertebrae. A sudden move or injury to the back may cause injury to, or pressure on, these nerves. This may result in localized back pain or pain movement (radiation) into the buttocks, down the leg, and into the foot. Sharp, shooting pain from the buttock down the back of the leg (sciatica) is frequently associated with a ruptured (herniated) disk. Pain may be caused by muscle spasm alone. Your caregiver can often find the cause of your pain by the details of your symptoms and an exam. In some cases, you may need tests (such as X-rays). Your caregiver will work with you to decide if any tests are needed based on your specific exam. HOME CARE INSTRUCTIONS   Avoid an underactive lifestyle. Active exercise, as directed by your caregiver, is your greatest weapon against back pain.  Avoid hard physical activities (tennis, racquetball, waterskiing) if you are not in proper physical condition for it. This may aggravate or create problems.  If you have a back problem, avoid sports requiring sudden body movements. Swimming and walking are generally safer activities.  Maintain good posture.  Avoid becoming overweight (obese).  Use bed rest for only the most extreme, sudden (acute) episode. Your caregiver will help you determine how much bed rest is necessary.  For acute conditions, you may put ice on the injured area.  Put ice in a plastic bag.  Place a towel between your skin and the bag.  Leave the ice on for 15 to 20 minutes at a time, every 2 hours, or as needed.  After you are improved and more active, it may help to  apply heat for 30 minutes before activities. See your caregiver if you are having pain that lasts longer than expected. Your caregiver can advise appropriate exercises or therapy if needed. With conditioning, most back problems can be avoided. SEEK IMMEDIATE MEDICAL CARE IF:   You have numbness, tingling, weakness, or problems with the use of your arms or legs.  You experience severe back pain not relieved with medicines.  There is a change in bowel or bladder control.  You have increasing pain in any area of the body, including your belly (abdomen).  You notice shortness of breath, dizziness, or feel faint.  You feel sick to your stomach (nauseous), are throwing up (vomiting), or become sweaty.  You notice discoloration of your toes or legs, or your feet get very cold.  Your back pain is getting worse.  You have a fever. MAKE SURE YOU:   Understand these instructions.  Will watch your condition.  Will get help right away if you are not doing well or get worse. Document Released: 07/08/2005 Document Revised: 12/21/2011 Document Reviewed: 12/28/2008 [facility] Patient Information 2013 [place], [place].  PLEASE DISCONTINUE PERCOCET AND FLEXERIL FOR NOW, TAKE DEMEROL  AND VALIUM .  ONCE BACK PAIN IS IMPROVED, YOU MAY GO BACK TO YOUR USUAL MEDICATIONS, BUT I STRONGLY RECOMMEND THAT YOU DO NOT COMBINE MEDICATIONS.  Your back x-rays were normal except for some mild arthritis.     Narcotic and benzodiazepine use may cause drowsiness, slowed breathing or dependence.  Please use with caution and do not  drive, operate machinery or watch young children alone while taking them.  Taking combinations of these medications or drinking alcohol will potentiate these effects.

## 2012-10-08 NOTE — ED Notes (Signed)
Pt states that she has been having sharp shooting pains across the middle of her back and leg swelling for the past 2 days.  Denies NVD.  Pain 8/10.

## 2012-10-08 NOTE — ED Notes (Signed)
Pt states having sharp/stabbing pain 10/10 in middle of back, then also having pain in both legs/calf area x 3 days. 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 Pt denies n/v/d. Pt also states legs are swollen, legs are not tender to touch, not warm to touch, no pitting edema noted.

## 2012-10-09 LAB — URINE CULTURE: Colony Count: 45000

## 2012-11-14 ENCOUNTER — Ambulatory Visit: Payer: Medicaid Other | Admitting: Obstetrics and Gynecology

## 2012-11-14 ENCOUNTER — Encounter: Payer: Self-pay | Admitting: Obstetrics and Gynecology

## 2012-11-14 VITALS — BP 98/70 | Resp 16 | Ht 66.5 in | Wt 348.0 lb

## 2012-11-14 DIAGNOSIS — N83209 Unspecified ovarian cyst, unspecified side: Secondary | ICD-10-CM

## 2012-11-14 DIAGNOSIS — N898 Other specified noninflammatory disorders of vagina: Secondary | ICD-10-CM

## 2012-11-14 DIAGNOSIS — N939 Abnormal uterine and vaginal bleeding, unspecified: Secondary | ICD-10-CM

## 2012-11-14 LAB — POCT WET PREP (WET MOUNT)
Trichomonas Wet Prep HPF POC: NEGATIVE
WBC, Wet Prep HPF POC: NEGATIVE

## 2012-11-14 MED ORDER — TINIDAZOLE 500 MG PO TABS: 2.0000 g | ORAL_TABLET | Freq: Every day | ORAL | Status: DC

## 2012-11-14 NOTE — Progress Notes (Signed)
Here to f/u VB a month ago just that one time lasting 5days but only used 1 pad each day and she is s/p LAVH several yrs ago.  She also wants to f/u ovarian cyst rec f/u.  Filed Vitals:   11/14/12 1351  BP: 98/70  Resp: 16   ROS: noncontributory  Pelvic exam:  VULVA: normal appearing vulva with no masses, tenderness or lesions,  VAGINA: normal appearing vagina with normal color and discharge, no lesions, white d/c but no bleeding noted at all not even a brownish d/c ADNEXA: normal adnexa in size, nontender and no masses.  A/P sched u/s Refer to GI for screening colonoscopy and confirm bleeding not GI related Wet prep - BV - tindamax Ovarian cyst on rt meas 2.3cm - rto after u/s

## 2012-11-21 DIAGNOSIS — D649 Anemia, unspecified: Secondary | ICD-10-CM | POA: Insufficient documentation

## 2012-11-21 DIAGNOSIS — N921 Excessive and frequent menstruation with irregular cycle: Secondary | ICD-10-CM | POA: Insufficient documentation

## 2012-11-21 DIAGNOSIS — N946 Dysmenorrhea, unspecified: Secondary | ICD-10-CM | POA: Insufficient documentation

## 2012-11-23 ENCOUNTER — Encounter: Payer: Self-pay | Admitting: Obstetrics and Gynecology

## 2012-11-23 ENCOUNTER — Other Ambulatory Visit: Payer: Self-pay

## 2012-11-23 ENCOUNTER — Ambulatory Visit: Payer: Medicaid Other | Admitting: Obstetrics and Gynecology

## 2012-11-23 ENCOUNTER — Ambulatory Visit: Payer: Medicaid Other

## 2012-11-23 VITALS — BP 118/74 | Resp 18 | Ht 66.0 in | Wt 340.0 lb

## 2012-11-23 DIAGNOSIS — N83209 Unspecified ovarian cyst, unspecified side: Secondary | ICD-10-CM

## 2012-11-23 NOTE — Progress Notes (Signed)
Here to f/u u/s after presented with rt ovarian cyst on CT abt ago and had rt ov cyst that she did not f/u on abt 100yr ago.  H/o LAVH  Filed Vitals:   11/23/12 1204  BP: 118/74  Resp: 18   U/S - rt ovary 3.9cm with simple cyst meas 3.4cm, lt ovary 1.7cm   A/P Simple ovarian cyst  - persistent vs new - asymptomatic Options discussed CA-125 today If CA-125 elevated, rec surgery now If CA-125 nl, may repeat u/s in If cyst is gone in then will obs If cyst ist still present, will proceed with laparoscopic removal and I rec lap rt oophorectomy and bilateral salpingectomy at that time.

## 2012-11-24 LAB — CA 125: CA 125: <2 U/mL (ref 0.0–30.2)

## 2013-02-24 ENCOUNTER — Other Ambulatory Visit: Payer: Self-pay | Admitting: Cardiovascular Disease

## 2013-02-24 LAB — COMPREHENSIVE METABOLIC PANEL
ALT: 19 U/L (ref 0–35)
AST: 19 U/L (ref 0–37)
Alkaline Phosphatase: 82 U/L (ref 39–117)
Potassium: 4 mEq/L (ref 3.5–5.3)
Sodium: 139 mEq/L (ref 135–145)
Total Bilirubin: 0.5 mg/dL (ref 0.3–1.2)
Total Protein: 8.3 g/dL (ref 6.0–8.3)

## 2013-03-10 ENCOUNTER — Telehealth: Payer: Self-pay | Admitting: Cardiovascular Disease

## 2013-03-10 MED ORDER — METOPROLOL SUCCINATE ER 25 MG PO TB24: 25.0000 mg | ORAL_TABLET | Freq: Every day | ORAL | Status: DC

## 2013-03-10 NOTE — Telephone Encounter (Signed)
The pharmacist says she needs an authorization for her Metoprolol-Went yesterday to pick it up and could not get it!a

## 2013-03-10 NOTE — Telephone Encounter (Signed)
rx refill for metoprolol

## 2013-04-03 ENCOUNTER — Encounter: Payer: Self-pay | Admitting: Cardiovascular Disease

## 2013-04-12 ENCOUNTER — Ambulatory Visit: Payer: Medicaid Other | Attending: Orthopaedic Surgery | Admitting: Physical Therapy

## 2013-04-12 DIAGNOSIS — IMO0001 Reserved for inherently not codable concepts without codable children: Secondary | ICD-10-CM | POA: Insufficient documentation

## 2013-04-12 DIAGNOSIS — M25519 Pain in unspecified shoulder: Secondary | ICD-10-CM | POA: Insufficient documentation

## 2013-05-02 ENCOUNTER — Other Ambulatory Visit: Payer: Self-pay | Admitting: Orthopaedic Surgery

## 2013-05-02 DIAGNOSIS — M25512 Pain in left shoulder: Secondary | ICD-10-CM

## 2013-05-03 ENCOUNTER — Ambulatory Visit
Admission: RE | Admit: 2013-05-03 | Discharge: 2013-05-03 | Disposition: A | Discharge status: Home / Self Care | Payer: Medicaid Other | Source: Ambulatory Visit | Attending: Orthopaedic Surgery | Admitting: Orthopaedic Surgery

## 2013-05-03 DIAGNOSIS — M25512 Pain in left shoulder: Secondary | ICD-10-CM

## 2013-05-29 ENCOUNTER — Telehealth: Payer: Self-pay | Admitting: Cardiovascular Disease

## 2013-05-29 NOTE — Telephone Encounter (Signed)
Message forwarded to [facility]. Berlinda Last, LPN to discuss w/ Dr. Alanda Amass.  This note and paper chart# [id number] placed on Dr. Kandis Cocking cart.

## 2013-05-29 NOTE — Telephone Encounter (Signed)
Is needing the cardiac clearance that was sent over . Having some dental work done and need to have  Clearance . Please call   Thanks

## 2013-06-15 NOTE — Telephone Encounter (Signed)
Clearance form faxed and pt has been scheduled for her dental procedure

## 2013-06-19 ENCOUNTER — Other Ambulatory Visit: Payer: Self-pay | Admitting: Cardiovascular Disease

## 2013-06-19 LAB — URIC ACID: Uric Acid, Serum: 6.4 mg/dL (ref 2.4–7.0)

## 2013-06-19 LAB — CBC WITH DIFFERENTIAL/PLATELET
Basophils Absolute: 0 10*3/uL (ref 0.0–0.1)
Basophils Relative: 0 % (ref 0–1)
Eosinophils Absolute: 0.3 10*3/uL (ref 0.0–0.7)
Eosinophils Relative: 4 % (ref 0–5)
HCT: 34.3 % — ABNORMAL LOW (ref 36.0–46.0)
Hemoglobin: 11.5 g/dL — ABNORMAL LOW (ref 12.0–15.0)
Lymphocytes Relative: 38 % (ref 12–46)
Lymphs Abs: 2.9 10*3/uL (ref 0.7–4.0)
MCH: 28 pg (ref 26.0–34.0)
MCHC: 33.5 g/dL (ref 30.0–36.0)
MCV: 83.7 fL (ref 78.0–100.0)
Monocytes Absolute: 0.8 10*3/uL (ref 0.1–1.0)
Monocytes Relative: 10 % (ref 3–12)
Neutro Abs: 3.6 10*3/uL (ref 1.7–7.7)
Neutrophils Relative %: 48 % (ref 43–77)
Platelets: 349 10*3/uL (ref 150–400)
RBC: 4.1 MIL/uL (ref 3.87–5.11)
RDW: 15.7 % — ABNORMAL HIGH (ref 11.5–15.5)
WBC: 7.6 10*3/uL (ref 4.0–10.5)

## 2013-06-19 LAB — COMPREHENSIVE METABOLIC PANEL
ALT: 19 U/L (ref 0–35)
AST: 19 U/L (ref 0–37)
Albumin: 3.9 g/dL (ref 3.5–5.2)
Alkaline Phosphatase: 69 U/L (ref 39–117)
BUN: 6 mg/dL (ref 6–23)
CO2: 31 mEq/L (ref 19–32)
Calcium: 9.3 mg/dL (ref 8.4–10.5)
Chloride: 103 mEq/L (ref 96–112)
Creat: 0.77 mg/dL (ref 0.50–1.10)
Glucose, Bld: 97 mg/dL (ref 70–99)
Potassium: 4.2 mEq/L (ref 3.5–5.3)
Sodium: 141 mEq/L (ref 135–145)
Total Bilirubin: 0.4 mg/dL (ref 0.3–1.2)
Total Protein: 7.3 g/dL (ref 6.0–8.3)

## 2013-06-19 LAB — HEMOGLOBIN A1C
Hgb A1c MFr Bld: 6.4 % — ABNORMAL HIGH (ref <5.7)
Mean Plasma Glucose: 137 mg/dL — ABNORMAL HIGH (ref <117)

## 2013-06-26 ENCOUNTER — Encounter (HOSPITAL_COMMUNITY): Payer: Self-pay | Admitting: Pharmacy Technician

## 2013-06-28 MED ORDER — BUPIVACAINE HCL (PF) 0.25 % IJ SOLN
INTRAMUSCULAR | Status: AC
  Filled 2013-06-28: qty 30

## 2013-06-29 ENCOUNTER — Telehealth: Payer: Self-pay | Admitting: Cardiovascular Disease

## 2013-06-29 ENCOUNTER — Encounter (HOSPITAL_COMMUNITY)
Admission: RE | Admit: 2013-06-29 | Discharge: 2013-06-29 | Disposition: A | Discharge status: Home / Self Care | Payer: Medicaid Other | Source: Ambulatory Visit | Attending: Oral Surgery | Admitting: Oral Surgery

## 2013-06-29 ENCOUNTER — Ambulatory Visit (HOSPITAL_COMMUNITY)
Admission: RE | Admit: 2013-06-29 | Discharge: 2013-06-29 | Disposition: A | Discharge status: Home / Self Care | Payer: Medicaid Other | Source: Ambulatory Visit | Attending: Oral Surgery | Admitting: Oral Surgery

## 2013-06-29 ENCOUNTER — Encounter (HOSPITAL_COMMUNITY): Payer: Self-pay

## 2013-06-29 ENCOUNTER — Other Ambulatory Visit (HOSPITAL_COMMUNITY): Payer: Self-pay | Admitting: *Deleted

## 2013-06-29 DIAGNOSIS — I10 Essential (primary) hypertension: Secondary | ICD-10-CM | POA: Insufficient documentation

## 2013-06-29 DIAGNOSIS — I499 Cardiac arrhythmia, unspecified: Secondary | ICD-10-CM | POA: Insufficient documentation

## 2013-06-29 HISTORY — DX: Type 2 diabetes mellitus without complications: E11.9

## 2013-06-29 HISTORY — DX: Cardiac arrhythmia, unspecified: I49.9

## 2013-06-29 LAB — CBC
Hemoglobin: 12.7 g/dL (ref 12.0–15.0)
MCH: 28.5 pg (ref 26.0–34.0)
MCV: 84 fL (ref 78.0–100.0)
RBC: 4.45 MIL/uL (ref 3.87–5.11)

## 2013-06-29 LAB — COMPREHENSIVE METABOLIC PANEL
AST: 23 U/L (ref 0–37)
Albumin: 4 g/dL (ref 3.5–5.2)
Calcium: 9.4 mg/dL (ref 8.4–10.5)
Creatinine, Ser: 0.77 mg/dL (ref 0.50–1.10)
GFR calc non Af Amer: >90 mL/min (ref >90)

## 2013-06-29 NOTE — Progress Notes (Signed)
Called Dr. Kandis Cocking office to be sure patient could stop aspirin for surgery.(office will need to call back, although patient stated she has actually received okay from Dr. Alanda Amass), also requested cardiac clearance note, stress test, echo.

## 2013-06-29 NOTE — Telephone Encounter (Signed)
Wants to stop her aspirin 81 mg for dental surgery-If so when and how long-Surgery is schedule for 07-03-13?

## 2013-06-29 NOTE — Telephone Encounter (Signed)
OK to hold ASA orders per oral surgeon and restart at his direction.

## 2013-06-29 NOTE — H&P (Signed)
HISTORY AND PHYSICAL  [name] is a 52 y.o. female patient with CC: painful tooth left jaw  No diagnosis found.  Past Medical History  Diagnosis Date  . Hypertension 4 months  . Hypercholesteremia   . GERD (gastroesophageal reflux disease)   . DVT (deep venous thrombosis)   . Prediabetes   . Morbid obesity   . Anxiety   . Substance abuse   . Fibroid   . Menometrorrhagia   . Dysmenorrhea   . Dysrhythmia     irregular heart beat  . Diabetes mellitus without complication     recent dx   . Arthritis   . Anemia     No current facility-administered medications for this encounter.   Current Outpatient Prescriptions  Medication Sig Dispense Refill  . allopurinol (ZYLOPRIM) 100 MG tablet Take 100 mg by mouth daily.      Marland Kitchen amoxicillin (AMOXIL) 875 MG tablet Take 875 mg by mouth 2 (two) times daily.      Marland Kitchen aspirin 81 MG chewable tablet Chew 81 mg by mouth daily.      Marland Kitchen atorvastatin (LIPITOR) 40 MG tablet Take 40 mg by mouth daily.      . busPIRone (BUSPAR) 7.5 MG tablet Take 7.5 mg by mouth 2 (two) times daily.       . cyclobenzaprine (FLEXERIL) 10 MG tablet Take 10 mg by mouth 2 (two) times daily as needed for muscle spasms. Muscle spasms      . furosemide (LASIX) 20 MG tablet Take 20 mg by mouth daily.      Marland Kitchen HYDROcodone-acetaminophen (NORCO) 10-325 MG per tablet Take 1 tablet by mouth every 8 (eight) hours.       . metFORMIN (GLUCOPHAGE) 500 MG tablet Take 500 mg by mouth 2 (two) times daily with a meal.      . metoprolol succinate (TOPROL-XL) 25 MG 24 hr tablet Take 25 mg by mouth daily.      Marland Kitchen omeprazole (PRILOSEC) 40 MG capsule Take 40 mg by mouth daily.      . potassium chloride SA (K-DUR,KLOR-CON) 20 MEQ tablet Take 20 mEq by mouth daily.       . traMADol (ULTRAM) 50 MG tablet Take 50 mg by mouth 3 (three) times daily as needed for pain. pain       Allergies  Allergen Reactions  . Buprenorphine Hcl-Naloxone Hcl Hives  . Dilaudid [Hydromorphone Hcl] Hives    Active Problems:   * No active hospital problems. *  Vitals: There were no vitals taken for this visit. Lab results: Results for orders placed during the hospital encounter of 06/29/13 (from the past 24 hour(s))  CBC     Status: None   Collection Time    06/29/13 10:34 AM      Result Value Range   WBC 7.9  4.0 - 10.5 K/uL   RBC 4.45  3.87 - 5.11 MIL/uL   Hemoglobin 12.7  12.0 - 15.0 g/dL   HCT 40.9  81.1 - 91.4 %   MCV 84.0  78.0 - 100.0 fL   MCH 28.5  26.0 - 34.0 pg   MCHC 34.0  30.0 - 36.0 g/dL   RDW 78.2  95.6 - 21.3 %   Platelets 347  150 - 400 K/uL  COMPREHENSIVE METABOLIC PANEL     Status: Abnormal   Collection Time    06/29/13 10:39 AM      Result Value Range   Sodium 141  135 - 145 mEq/L  Potassium 3.4 (*) 3.5 - 5.1 mEq/L   Chloride 102  96 - 112 mEq/L   CO2 31  19 - 32 mEq/L   Glucose, Bld 123 (*) 70 - 99 mg/dL   BUN 6  6 - 23 mg/dL   Creatinine, Ser 1.61  0.50 - 1.10 mg/dL   Calcium 9.4  8.4 - 09.6 mg/dL   Total Protein 8.4 (*) 6.0 - 8.3 g/dL   Albumin 4.0  3.5 - 5.2 g/dL   AST 23  0 - 37 U/L   ALT 25  0 - 35 U/L   Alkaline Phosphatase 77  39 - 117 U/L   Total Bilirubin 0.4  0.3 - 1.2 mg/dL   GFR calc non Af Amer >90  >90 mL/min   GFR calc Af Amer >90  >90 mL/min   Radiology Results: Dg Chest 2 View  06/29/2013   CLINICAL DATA:  Hypertension and irregular heartbeat  EXAM: CHEST  2 VIEW  COMPARISON:  Chest radiograph June 09, 2012 and chest CT June 13, 2012  FINDINGS: Lungs are clear. Heart size and pulmonary vascularity are normal. No adenopathy. No bone lesions. There is mild degenerative change in the thoracic spine.  IMPRESSION: No edema or consolidation.   Electronically Signed   By: Bretta Bang   On: 06/29/2013 10:48   General appearance: alert, cooperative and moderately obese Head: Normocephalic, without obvious abnormality, atraumatic Eyes: negative Ears: normal TM's and external ear canals both ears Nose: Nares normal. Septum midline.  Mucosa normal. No drainage or sinus tenderness. Throat: Dental caries tooth # 20, bilateral mandibular lingual tori Neck: no adenopathy, supple, symmetrical, trachea midline and thyroid not enlarged, symmetric, no tenderness/mass/nodules Resp: clear to auscultation bilaterally Cardio: regular rate and rhythm, S1, S2 normal, no murmur, click, rub or gallop  Assessment: 51 YO Htn, GERD, Morbid obesity, DVT, substance abuse with nonrestorable tooth # 20, bilateral mandibular tori.  Plan:Extract tooth # 20, remove bilateral mandibular tori. General anesthesia. Day surgery.   Georgia Lopes 06/29/2013

## 2013-06-29 NOTE — Pre-Procedure Instructions (Signed)
KARINNA BEADLES  06/29/2013   Your procedure is scheduled on:  July 03, 2013 at 1:00 PM  Report to [facility] at 11:00 AM.  Call this number if you have problems the morning of surgery: [phone]   Remember:   Do not eat food or drink liquids after midnight.   Take these medicines the morning of surgery with A SIP OF WATER: metoprolol succinate (TOPROL-XL), omeprazole (PRILOSEC), traMADol (ULTRAM), busPIRone (BUSPAR), cyclobenzaprine (FLEXERIL) - if needed, HYDROcodone-acetaminophen (NORCO) - if needed.  Stop all Vitamins, Herbal Medications, Aspirin, and Non-Steroidals as of today 06/29/13.                Do not wear jewelry, make-up or nail polish.  Do not wear lotions, powders, or perfumes. You may wear deodorant.  Do not shave 48 hours prior to surgery.  Do not bring valuables to the hospital.  [facility] is not responsible                   for any belongings or valuables.  Contacts, dentures or bridgework may not be worn into surgery.  Leave suitcase in the car. After surgery it may be brought to your room.  For patients admitted to the hospital, checkout time is 11:00 AM the day of  discharge.   Patients discharged the day of surgery will not be allowed to drive  home.  Name and phone number of your driver: Family/Friend  Special Instructions: Shower using CHG 2 nights before surgery and the night before surgery.  If you shower the day of surgery use CHG.  Use special wash - you have one bottle of CHG for all showers.  You should use approximately 1/3 of the bottle for each shower.   Please read over the following fact sheets that you were given: Pain Booklet, Coughing and Deep Breathing and Surgical Site Infection Prevention

## 2013-06-29 NOTE — Telephone Encounter (Signed)
OK to wait until after surgery to have Echo done.  Voiced understanding.

## 2013-07-02 MED ORDER — DEXTROSE 5 % IV SOLN
3.0000 g | INTRAVENOUS | Status: AC
  Administered 2013-07-03: 3 g via INTRAVENOUS
  Filled 2013-07-02 (×2): qty 3000

## 2013-07-03 ENCOUNTER — Ambulatory Visit (HOSPITAL_COMMUNITY)
Admission: RE | Admit: 2013-07-03 | Discharge: 2013-07-03 | Disposition: A | Discharge status: Home / Self Care | Payer: Medicaid Other | Source: Ambulatory Visit | Attending: Oral Surgery | Admitting: Oral Surgery

## 2013-07-03 ENCOUNTER — Encounter (HOSPITAL_COMMUNITY): Payer: Self-pay | Admitting: *Deleted

## 2013-07-03 ENCOUNTER — Encounter (HOSPITAL_COMMUNITY): Payer: Self-pay | Admitting: Certified Registered"

## 2013-07-03 ENCOUNTER — Ambulatory Visit (HOSPITAL_COMMUNITY): Payer: Medicaid Other | Admitting: Certified Registered"

## 2013-07-03 ENCOUNTER — Encounter (HOSPITAL_COMMUNITY)
Admission: RE | Disposition: A | Discharge status: Home / Self Care | Payer: Self-pay | Source: Ambulatory Visit | Attending: Oral Surgery

## 2013-07-03 DIAGNOSIS — N938 Other specified abnormal uterine and vaginal bleeding: Secondary | ICD-10-CM

## 2013-07-03 DIAGNOSIS — I10 Essential (primary) hypertension: Secondary | ICD-10-CM | POA: Insufficient documentation

## 2013-07-03 DIAGNOSIS — Z8739 Personal history of other diseases of the musculoskeletal system and connective tissue: Secondary | ICD-10-CM

## 2013-07-03 DIAGNOSIS — M278 Other specified diseases of jaws: Secondary | ICD-10-CM | POA: Insufficient documentation

## 2013-07-03 DIAGNOSIS — E119 Type 2 diabetes mellitus without complications: Secondary | ICD-10-CM | POA: Insufficient documentation

## 2013-07-03 DIAGNOSIS — D649 Anemia, unspecified: Secondary | ICD-10-CM

## 2013-07-03 DIAGNOSIS — M67919 Unspecified disorder of synovium and tendon, unspecified shoulder: Secondary | ICD-10-CM

## 2013-07-03 DIAGNOSIS — N946 Dysmenorrhea, unspecified: Secondary | ICD-10-CM

## 2013-07-03 DIAGNOSIS — N921 Excessive and frequent menstruation with irregular cycle: Secondary | ICD-10-CM

## 2013-07-03 DIAGNOSIS — M270 Developmental disorders of jaws: Secondary | ICD-10-CM

## 2013-07-03 DIAGNOSIS — E785 Hyperlipidemia, unspecified: Secondary | ICD-10-CM

## 2013-07-03 DIAGNOSIS — N949 Unspecified condition associated with female genital organs and menstrual cycle: Secondary | ICD-10-CM

## 2013-07-03 DIAGNOSIS — K0401 Reversible pulpitis: Secondary | ICD-10-CM

## 2013-07-03 DIAGNOSIS — K089 Disorder of teeth and supporting structures, unspecified: Secondary | ICD-10-CM | POA: Insufficient documentation

## 2013-07-03 HISTORY — PX: TOOTH EXTRACTION: SHX859

## 2013-07-03 HISTORY — PX: MANDIBLE OSTEOTOMY: SHX1007

## 2013-07-03 LAB — GLUCOSE, CAPILLARY
Glucose-Capillary: 89 mg/dL (ref 70–99)
Glucose-Capillary: 99 mg/dL (ref 70–99)

## 2013-07-03 SURGERY — DENTAL RESTORATION/EXTRACTIONS
Anesthesia: General | Site: Mouth | Wound class: Clean Contaminated

## 2013-07-03 MED ORDER — OXYCODONE HCL 5 MG PO TABS
ORAL_TABLET | ORAL | Status: AC
  Filled 2013-07-03: qty 1

## 2013-07-03 MED ORDER — ONDANSETRON HCL 4 MG/2ML IJ SOLN: 4.0000 mg | Freq: Once | INTRAMUSCULAR | Status: DC | PRN

## 2013-07-03 MED ORDER — LIDOCAINE-EPINEPHRINE 2 %-1:100000 IJ SOLN
INTRAMUSCULAR | Status: AC
  Filled 2013-07-03: qty 1

## 2013-07-03 MED ORDER — FENTANYL CITRATE 0.05 MG/ML IJ SOLN
INTRAMUSCULAR | Status: AC
  Administered 2013-07-03: 50 ug via INTRAVENOUS
  Filled 2013-07-03: qty 2

## 2013-07-03 MED ORDER — OXYCODONE-ACETAMINOPHEN 5-325 MG PO TABS
ORAL_TABLET | ORAL | Status: AC
  Filled 2013-07-03: qty 1

## 2013-07-03 MED ORDER — OXYCODONE HCL 5 MG PO TABS
5.0000 mg | ORAL_TABLET | ORAL | Status: DC | PRN
  Administered 2013-07-03: 5 mg via ORAL

## 2013-07-03 MED ORDER — LIDOCAINE-EPINEPHRINE 2 %-1:100000 IJ SOLN
INTRAMUSCULAR | Status: DC | PRN
  Administered 2013-07-03: 20 mL

## 2013-07-03 MED ORDER — MIDAZOLAM HCL 5 MG/5ML IJ SOLN
INTRAMUSCULAR | Status: DC | PRN
  Administered 2013-07-03: 2 mg via INTRAVENOUS

## 2013-07-03 MED ORDER — LACTATED RINGERS IV SOLN
INTRAVENOUS | Status: DC
  Administered 2013-07-03: 11:00:00 via INTRAVENOUS

## 2013-07-03 MED ORDER — ONDANSETRON HCL 4 MG/2ML IJ SOLN
INTRAMUSCULAR | Status: DC | PRN
  Administered 2013-07-03: 4 mg via INTRAVENOUS

## 2013-07-03 MED ORDER — LIDOCAINE-EPINEPHRINE 1 %-1:100000 IJ SOLN
INTRAMUSCULAR | Status: AC
  Filled 2013-07-03: qty 1

## 2013-07-03 MED ORDER — OXYMETAZOLINE HCL 0.05 % NA SOLN
NASAL | Status: DC | PRN
  Administered 2013-07-03: 2 via NASAL

## 2013-07-03 MED ORDER — FENTANYL CITRATE 0.05 MG/ML IJ SOLN
INTRAMUSCULAR | Status: DC | PRN
  Administered 2013-07-03: 100 ug via INTRAVENOUS
  Administered 2013-07-03: 50 ug via INTRAVENOUS

## 2013-07-03 MED ORDER — FENTANYL CITRATE 0.05 MG/ML IJ SOLN
25.0000 ug | INTRAMUSCULAR | Status: DC | PRN
  Administered 2013-07-03 (×3): 50 ug via INTRAVENOUS

## 2013-07-03 MED ORDER — OXYCODONE-ACETAMINOPHEN 5-325 MG PO TABS
1.0000 | ORAL_TABLET | ORAL | Status: DC | PRN
  Administered 2013-07-03: 1 via ORAL

## 2013-07-03 MED ORDER — SUCCINYLCHOLINE CHLORIDE 20 MG/ML IJ SOLN
INTRAMUSCULAR | Status: DC | PRN
  Administered 2013-07-03: 100 mg via INTRAVENOUS

## 2013-07-03 MED ORDER — SODIUM CHLORIDE 0.9 % IR SOLN
Status: DC | PRN
  Administered 2013-07-03: 1000 mL

## 2013-07-03 MED ORDER — PROPOFOL 10 MG/ML IV BOLUS
INTRAVENOUS | Status: DC | PRN
  Administered 2013-07-03: 200 mg via INTRAVENOUS

## 2013-07-03 MED ORDER — 0.9 % SODIUM CHLORIDE (POUR BTL) OPTIME
TOPICAL | Status: DC | PRN
  Administered 2013-07-03: 1000 mL

## 2013-07-03 MED ORDER — AMOXICILLIN 500 MG PO CAPS: 500.0000 mg | ORAL_CAPSULE | Freq: Three times a day (TID) | ORAL | Status: DC

## 2013-07-03 MED ORDER — OXYCODONE-ACETAMINOPHEN 10-325 MG PO TABS: 1.0000 | ORAL_TABLET | ORAL | Status: DC | PRN

## 2013-07-03 MED ORDER — LIDOCAINE HCL (CARDIAC) 20 MG/ML IV SOLN
INTRAVENOUS | Status: DC | PRN
  Administered 2013-07-03: 50 mg via INTRAVENOUS

## 2013-07-03 MED ORDER — METOPROLOL SUCCINATE ER 25 MG PO TB24
25.0000 mg | ORAL_TABLET | Freq: Every day | ORAL | Status: DC
  Administered 2013-07-03: 25 mg via ORAL
  Filled 2013-07-03: qty 1

## 2013-07-03 MED ORDER — FENTANYL CITRATE 0.05 MG/ML IJ SOLN
50.0000 ug | Freq: Once | INTRAMUSCULAR | Status: AC
  Administered 2013-07-03: 50 ug via INTRAVENOUS

## 2013-07-03 SURGICAL SUPPLY — 46 items
ATTRACTOMAT 16X20 MAGNETIC DRP (DRAPES) ×3 IMPLANT
BANDAGE ELASTIC 4 VELCRO ST LF (GAUZE/BANDAGES/DRESSINGS) ×3 IMPLANT
BANDAGE GAUZE ELAST BULKY 4 IN (GAUZE/BANDAGES/DRESSINGS) ×3 IMPLANT
BLADE SURG CLIPPER 3M 9600 (MISCELLANEOUS) ×3 IMPLANT
BUR CROSS CUT FISSURE 1.6 (BURR) ×3 IMPLANT
BUR EGG ELITE 4.0 (BURR) ×3 IMPLANT
BUR SURG 4X8 MED (BURR) IMPLANT
BURR SURG 4X8 MED (BURR)
CANISTER SUCTION 2500CC (MISCELLANEOUS) ×3 IMPLANT
CLEANER TIP ELECTROSURG 2X2 (MISCELLANEOUS) ×3 IMPLANT
CLOTH BEACON ORANGE TIMEOUT ST (SAFETY) ×3 IMPLANT
COVER SURGICAL LIGHT HANDLE (MISCELLANEOUS) ×3 IMPLANT
CRADLE DONUT ADULT HEAD (MISCELLANEOUS) ×3 IMPLANT
DRAPE SURG 17X23 STRL (DRAPES) ×6 IMPLANT
DRESSING TELFA 8X3 (GAUZE/BANDAGES/DRESSINGS) ×3 IMPLANT
ELECT COATED BLADE 2.86 ST (ELECTRODE) ×3 IMPLANT
ELECT PAD GROUND ADT 9 (MISCELLANEOUS) ×3 IMPLANT
GAUZE PACKING FOLDED 2  STR (GAUZE/BANDAGES/DRESSINGS) ×1
GAUZE PACKING FOLDED 2 STR (GAUZE/BANDAGES/DRESSINGS) ×2 IMPLANT
GAUZE SPONGE 4X4 16PLY XRAY LF (GAUZE/BANDAGES/DRESSINGS) ×3 IMPLANT
GLOVE BIO SURGEON STRL SZ 6.5 (GLOVE) ×3 IMPLANT
GLOVE BIO SURGEON STRL SZ7.5 (GLOVE) ×6 IMPLANT
GLOVE BIOGEL PI IND STRL 7.0 (GLOVE) ×2 IMPLANT
GLOVE BIOGEL PI INDICATOR 7.0 (GLOVE) ×1
GOWN STRL NON-REIN LRG LVL3 (GOWN DISPOSABLE) ×6 IMPLANT
GOWN STRL REIN XL XLG (GOWN DISPOSABLE) ×3 IMPLANT
KIT BASIN OR (CUSTOM PROCEDURE TRAY) ×3 IMPLANT
KIT ROOM TURNOVER OR (KITS) ×3 IMPLANT
NEEDLE 22X1 1/2 (OR ONLY) (NEEDLE) ×6 IMPLANT
NS IRRIG 1000ML POUR BTL (IV SOLUTION) ×3 IMPLANT
PAD ARMBOARD 7.5X6 YLW CONV (MISCELLANEOUS) ×6 IMPLANT
PENCIL BUTTON HOLSTER BLD 10FT (ELECTRODE) ×3 IMPLANT
SPONGE GAUZE 4X4 12PLY (GAUZE/BANDAGES/DRESSINGS) ×3 IMPLANT
SUT CHROMIC 3 0 PS 2 (SUTURE) ×6 IMPLANT
SUT CHROMIC 4 0 P 3 18 (SUTURE) ×3 IMPLANT
SUT PROLENE 5 0 P 3 (SUTURE) ×3 IMPLANT
SUT VIC AB 4-0 PC1 18 (SUTURE) ×3 IMPLANT
SYR 50ML SLIP (SYRINGE) IMPLANT
SYR CONTROL 10ML LL (SYRINGE) ×3 IMPLANT
TOWEL OR 17X24 6PK STRL BLUE (TOWEL DISPOSABLE) ×3 IMPLANT
TOWEL OR 17X26 10 PK STRL BLUE (TOWEL DISPOSABLE) ×3 IMPLANT
TRAY ENT MC OR (CUSTOM PROCEDURE TRAY) ×3 IMPLANT
TUBE CONNECTING 12X1/4 (SUCTIONS) ×3 IMPLANT
TUBING IRRIGATION (MISCELLANEOUS) ×3 IMPLANT
WATER STERILE IRR 1000ML POUR (IV SOLUTION) ×3 IMPLANT
YANKAUER SUCT BULB TIP NO VENT (SUCTIONS) ×3 IMPLANT

## 2013-07-03 NOTE — Transfer of Care (Signed)
Immediate Anesthesia Transfer of Care Note  Patient: [name]  Procedure(s) Performed: Procedure(s): DENTAL EXTRACTION # 20 (N/A) BILATERAL TORI (Bilateral)  Patient Location: PACU  Anesthesia Type:General  Level of Consciousness: awake, alert  and oriented  Airway & Oxygen Therapy: Patient Spontanous Breathing and Patient connected to nasal cannula oxygen  Post-op Assessment: Report given to PACU RN, Post -op Vital signs reviewed and stable and Patient moving all extremities  Post vital signs: Reviewed and stable  Complications: No apparent anesthesia complications

## 2013-07-03 NOTE — Anesthesia Procedure Notes (Signed)
Procedure Name: Intubation Date/Time: 07/03/2013 12:27 PM Performed by: Ellin Goodie Pre-anesthesia Checklist: Patient identified, Emergency Drugs available, Suction available, Patient being monitored and Timeout performed Patient Re-evaluated:Patient Re-evaluated prior to inductionOxygen Delivery Method: Circle system utilized Preoxygenation: Pre-oxygenation with 100% oxygen Intubation Type: IV induction Ventilation: Mask ventilation without difficulty Laryngoscope Size: Mac and 3 Grade View: Grade I Tube type: Oral Tube size: 7.0 mm Number of attempts: 2 Airway Equipment and Method: Stylet Placement Confirmation: ETT inserted through vocal cords under direct vision,  positive ETCO2 and breath sounds checked- equal and bilateral Secured at: 22 cm Tube secured with: Tape Dental Injury: Teeth and Oropharynx as per pre-operative assessment  Comments: Easy atraumatic nasal induction and intubation with Nasal rae via right nare.  Cuff rupture noted at inflation of balloon.  Nasal tube removed by Dr. Ivin Booty and orally intubated with MAC 3 blade.  Marked tube at 22, per Dr. Barbette Merino.  Dr. Ivin Booty verified placement of ETT.  Carlynn Herald, CRNA

## 2013-07-03 NOTE — Op Note (Signed)
07/03/2013  12:49 PM  PATIENT:  [name]  52 y.o. female  PRE-OPERATIVE DIAGNOSIS:  NON-RESTORABLE TOOTH #20, Bilateral MANDIBULAR TORI  POST-OPERATIVE DIAGNOSIS:  SAME  PROCEDURE:  Procedure(s): DENTAL EXTRACTION # 20, Removal  BILATERAL Mandibular TORI  SURGEON:  Surgeon(s): Georgia Lopes, DDS  ANESTHESIA:   local and general  EBL:  minimal  DRAINS: none   SPECIMEN:  No Specimen  COUNTS:  YES  PLAN OF CARE: Discharge to home after PACU  PATIENT DISPOSITION:  PACU - hemodynamically stable.   PROCEDURE DETAILS: Dictation # [id number]  Georgia Lopes, DMD 07/03/2013 12:49 PM

## 2013-07-03 NOTE — H&P (Signed)
H&P documentation  -History and Physical Reviewed  -Patient has been re-examined  -No change in the plan of care  [name] M  

## 2013-07-03 NOTE — Preoperative (Signed)
Beta Blockers   Reason not to administer Beta Blockers:Metorpolol given at 1142 hrs on 07/03/13

## 2013-07-03 NOTE — Anesthesia Preprocedure Evaluation (Addendum)
Anesthesia Evaluation  Patient identified by MRN, date of birth, ID band Patient awake    Reviewed: Allergy & Precautions, H&P , NPO status , Patient's Chart, lab work & pertinent test results, reviewed documented beta blocker date and time   Airway Mallampati: I TM Distance: >3 FB     Dental  (+) Teeth Intact and Dental Advisory Given   Pulmonary  breath sounds clear to auscultation        Cardiovascular hypertension, Pt. on medications Rhythm:Regular Rate:Normal     Neuro/Psych Anxiety Depression    GI/Hepatic GERD-  Medicated and Controlled,  Endo/Other  diabetes, Well Controlled, Type 2, Oral Hypoglycemic AgentsMorbid obesity  Renal/GU      Musculoskeletal   Abdominal (+)  Abdomen: soft. Bowel sounds: normal.  Peds  Hematology   Anesthesia Other Findings   Reproductive/Obstetrics                         Anesthesia Physical Anesthesia Plan  ASA: III  Anesthesia Plan: General   Post-op Pain Management:    Induction: Intravenous  Airway Management Planned: Nasal ETT  Additional Equipment:   Intra-op Plan:   Post-operative Plan: Extubation in OR  Informed Consent: I have reviewed the patients History and Physical, chart, labs and discussed the procedure including the risks, benefits and alternatives for the proposed anesthesia with the patient or authorized representative who has indicated his/her understanding and acceptance.   Dental advisory given  Plan Discussed with: Anesthesiologist, CRNA and Surgeon  Anesthesia Plan Comments:         Anesthesia Quick Evaluation

## 2013-07-04 ENCOUNTER — Encounter (HOSPITAL_COMMUNITY): Payer: Self-pay | Admitting: Oral Surgery

## 2013-07-04 NOTE — Op Note (Signed)
[name], [name] NO.:  [id number]  MEDICAL RECORD NO.:  [id number]  LOCATION:  [facility]                         FACILITY:  [facility]  PHYSICIAN:  Georgia Lopes, M.D.  DATE OF BIRTH:  24-Mar-1961  DATE OF PROCEDURE:  07/03/2013 DATE OF DISCHARGE:  07/03/2013                              OPERATIVE REPORT   PREOPERATIVE DIAGNOSES:  Nonrestorable tooth #20, bilateral mandibular lingual tori.  POSTOPERATIVE DIAGNOSES:  Nonrestorable tooth #20, bilateral mandibular lingual tori.  PROCEDURE:  Extraction of teeth #20, removal of bilateral mandibular lingual tori.  SURGEON:  Georgia Lopes, M.D.  ANESTHESIA:  General oral intubation, Dr. Ivin Booty attending.  INDICATIONS FOR PROCEDURE:  [name] is a 53 year old female who is referred to me by her general dentist for removal of nonrestorable teeth #20 and removal of bilateral mandibular lingual tori.  The patient has a past medical history of hypertension, GERD, DVT, morbid obesity, substance abuse, dysmenorrhea, dysrhythmia, diabetes, arthritis, and anemia.  Because of the nature of the surgery and need for adequate anesthesia and airway protection, it was recommended that the patient undergo the surgery at hospital where she could be intubated and have airway protection.  PROCEDURE:  The patient was taken to the operating room, placed on the table in supine position.  General anesthesia was administered using intravenous medications and an oral endotracheal tube was placed and marked.  The patient was draped for the procedure and then time-out was performed.  Posterior pharynx was suctioned and throat pack was placed. 2% lidocaine 1:100,000 epinephrine was infiltrated in the right and left inferior alveolar block and in buccal infiltration in the anterior mandible.  A 15-blade was used to make a lingual incision beginning at tooth #19 and carrying anteriorly to tooth #23.  The periosteum was reflected until the  exostosis was removed and reflection was carried buccally around tooth #20.  Tooth #20 was then elevated with a 301 elevator and removed from the mouth with the Ash forceps.  Then, Seldin elevator was used to reflect and retract the periosteum in the region of the left lingual torus and an egg-shaped bur was used to remove this torus under irrigation.  Then, the area was  irrigated and closed with 3- 0 chromic.  The bite-block was then repositioned to the other side of the mouth as was the Sweetheart retractor and a 15-blade was used to make a lingual incision beginning at tooth #25 and carrying posteriorly to tooth #30.  The periosteum was reflected with periosteal elevator until the bony torus was exposed.  Then, the soft tissues were protected with a Seldin retractor and then the egg-shaped bur under irrigation was used to remove the torus.  Then, the areas were irrigated and closed with 3-0 chromic.  The oral cavity was then irrigated and suctioned. Throat pack was removed.  The patient was awakened, taken to the recovery room, breathing spontaneously in good condition.  EBL:  Minimum.  COMPLICATIONS:  None.  SPECIMENS:  None.     Georgia Lopes, M.D.     SMJ/MEDQ  D:  07/03/2013  T:  07/04/2013  Job:  [id number]

## 2013-07-05 ENCOUNTER — Encounter: Payer: Self-pay | Admitting: Cardiovascular Disease

## 2013-07-05 NOTE — Anesthesia Postprocedure Evaluation (Signed)
  Anesthesia Post-op Note  Patient: [name]  Procedure(s) Performed: Procedure(s): DENTAL EXTRACTION # 20 (N/A) BILATERAL TORI (Bilateral)  Patient Location: PACU  Anesthesia Type:General  Level of Consciousness: awake and alert   Airway and Oxygen Therapy: Patient Spontanous Breathing  Post-op Pain: mild  Post-op Assessment: Post-op Vital signs reviewed, Patient's Cardiovascular Status Stable, Respiratory Function Stable, Patent Airway, No signs of Nausea or vomiting and Pain level controlled  Post-op Vital Signs: stable  Complications: No apparent anesthesia complications

## 2013-11-20 ENCOUNTER — Emergency Department (HOSPITAL_COMMUNITY)
Admission: EM | Admit: 2013-11-20 | Discharge: 2013-11-20 | Disposition: A | Discharge status: Home / Self Care | Payer: Medicaid Other | Attending: Emergency Medicine | Admitting: Emergency Medicine

## 2013-11-20 ENCOUNTER — Emergency Department (HOSPITAL_COMMUNITY): Payer: Medicaid Other

## 2013-11-20 ENCOUNTER — Encounter (HOSPITAL_COMMUNITY): Payer: Self-pay | Admitting: Emergency Medicine

## 2013-11-20 DIAGNOSIS — X500XXA Overexertion from strenuous movement or load, initial encounter: Secondary | ICD-10-CM | POA: Insufficient documentation

## 2013-11-20 DIAGNOSIS — Z8742 Personal history of other diseases of the female genital tract: Secondary | ICD-10-CM | POA: Insufficient documentation

## 2013-11-20 DIAGNOSIS — Z7982 Long term (current) use of aspirin: Secondary | ICD-10-CM | POA: Insufficient documentation

## 2013-11-20 DIAGNOSIS — Y9301 Activity, walking, marching and hiking: Secondary | ICD-10-CM | POA: Insufficient documentation

## 2013-11-20 DIAGNOSIS — E119 Type 2 diabetes mellitus without complications: Secondary | ICD-10-CM | POA: Insufficient documentation

## 2013-11-20 DIAGNOSIS — Z86718 Personal history of other venous thrombosis and embolism: Secondary | ICD-10-CM | POA: Insufficient documentation

## 2013-11-20 DIAGNOSIS — S92353A Displaced fracture of fifth metatarsal bone, unspecified foot, initial encounter for closed fracture: Secondary | ICD-10-CM

## 2013-11-20 DIAGNOSIS — I10 Essential (primary) hypertension: Secondary | ICD-10-CM | POA: Insufficient documentation

## 2013-11-20 DIAGNOSIS — Z862 Personal history of diseases of the blood and blood-forming organs and certain disorders involving the immune mechanism: Secondary | ICD-10-CM | POA: Insufficient documentation

## 2013-11-20 DIAGNOSIS — S92309A Fracture of unspecified metatarsal bone(s), unspecified foot, initial encounter for closed fracture: Secondary | ICD-10-CM | POA: Insufficient documentation

## 2013-11-20 DIAGNOSIS — R269 Unspecified abnormalities of gait and mobility: Secondary | ICD-10-CM | POA: Insufficient documentation

## 2013-11-20 DIAGNOSIS — M129 Arthropathy, unspecified: Secondary | ICD-10-CM | POA: Insufficient documentation

## 2013-11-20 DIAGNOSIS — E7800 Pure hypercholesterolemia, unspecified: Secondary | ICD-10-CM | POA: Insufficient documentation

## 2013-11-20 DIAGNOSIS — K219 Gastro-esophageal reflux disease without esophagitis: Secondary | ICD-10-CM | POA: Insufficient documentation

## 2013-11-20 DIAGNOSIS — Z79899 Other long term (current) drug therapy: Secondary | ICD-10-CM | POA: Insufficient documentation

## 2013-11-20 DIAGNOSIS — Y92009 Unspecified place in unspecified non-institutional (private) residence as the place of occurrence of the external cause: Secondary | ICD-10-CM | POA: Insufficient documentation

## 2013-11-20 DIAGNOSIS — F411 Generalized anxiety disorder: Secondary | ICD-10-CM | POA: Insufficient documentation

## 2013-11-20 MED ORDER — OXYCODONE-ACETAMINOPHEN 5-325 MG PO TABS
1.0000 | ORAL_TABLET | ORAL | Status: DC | PRN
Start: 2013-11-20 — End: 2014-03-13

## 2013-11-20 MED ORDER — OXYCODONE-ACETAMINOPHEN 5-325 MG PO TABS
1.0000 | ORAL_TABLET | Freq: Once | ORAL | Status: AC
  Administered 2013-11-20: 1 via ORAL
  Filled 2013-11-20: qty 1

## 2013-11-20 NOTE — Discharge Instructions (Signed)
Call for a follow up appointment with a Family or Primary Care Provider.  Call Dr. Erlinda Hong for further evaluation and treatment of your foot fracture. Use crutches to aid with walking, you can partially bear weight to the right foot. Ice the foot 3-4 daily. Return if Symptoms worsen.   Take medication as prescribed.

## 2013-11-20 NOTE — ED Provider Notes (Signed)
CSN: [id number]     Arrival date & time 11/20/13  1039 History   First MD Initiated Contact with Patient 11/20/13 1118     Chief Complaint  Patient presents with  . Joint Swelling  . Ankle Pain    twisted r/ankle 2 days ago     (Consider location/radiation/quality/duration/timing/severity/associated sxs/prior Treatment) HPI Comments: [name] is a 53 year-old female with a past medical history of HTN, morbid obesity, arthritis, presenting the Emergency Department with a chief complaint of right foot pain and swelling.  The patient reports she "twisted her ankle" 3 days ago while walking down her front door steps.  She denies falling or other injury. She reports pain with ambulation and swelling. The patient states she has taken Norco without relief.   The history is provided by the patient and medical records. No language interpreter was used.    Past Medical History  Diagnosis Date  . Hypertension 4 months  . Hypercholesteremia   . GERD (gastroesophageal reflux disease)   . DVT (deep venous thrombosis)   . Prediabetes   . Morbid obesity   . Anxiety   . Substance abuse   . Fibroid   . Menometrorrhagia   . Dysmenorrhea   . Dysrhythmia     irregular heart beat  . Diabetes mellitus without complication     recent dx   . Arthritis   . Anemia    Past Surgical History  Procedure Laterality Date  . Rotator cuff repair  2009    bilaterally  . Abdominal hysterectomy  2008  . Ectopic pregnancy surgery  1980's  . Cholecystectomy    . Tonsillectomy    . Tubal ligation    . Tooth extraction N/A 07/03/2013    Procedure: DENTAL EXTRACTION # 20;  Surgeon: Gae Bon, DDS;  Location: [facility];  Service: Oral Surgery;  Laterality: N/A;  . Mandible osteotomy Bilateral 07/03/2013    Procedure: BILATERAL TORI;  Surgeon: Gae Bon, DDS;  Location: [facility];  Service: Oral Surgery;  Laterality: Bilateral;   Family History  Problem Relation Age of Onset  . Heart disease     No family history  . Diabetes Mother   . Hypertension Mother   . Diabetes Father   . Hypertension Father    History  Substance Use Topics  . Smoking status: Never Smoker   . Smokeless tobacco: Never Used  . Alcohol Use: No     Comment: Previous ETOH abuse   OB History   Grav Para Term Preterm Abortions TAB SAB Ect Mult Living   8 6   1   1        Review of Systems  Gastrointestinal: Negative for abdominal pain.  Musculoskeletal: Positive for gait problem and joint swelling. Negative for back pain and neck pain.  Neurological: Negative for syncope, light-headedness and headaches.      Allergies  Buprenorphine hcl-naloxone hcl and Dilaudid  Home Medications   Current Outpatient Rx  Name  Route  Sig  Dispense  Refill  . allopurinol (ZYLOPRIM) 100 MG tablet   Oral   Take 100 mg by mouth daily.         Marland Kitchen aspirin 81 MG chewable tablet   Oral   Chew 81 mg by mouth daily.         Marland Kitchen atorvastatin (LIPITOR) 40 MG tablet   Oral   Take 40 mg by mouth daily.         . busPIRone (BUSPAR)  7.5 MG tablet   Oral   Take 7.5 mg by mouth 2 (two) times daily.          . cyclobenzaprine (FLEXERIL) 10 MG tablet   Oral   Take 10 mg by mouth 2 (two) times daily as needed for muscle spasms. Muscle spasms         . furosemide (LASIX) 20 MG tablet   Oral   Take 20 mg by mouth daily.         Marland Kitchen HYDROcodone-acetaminophen (NORCO) 10-325 MG per tablet   Oral   Take 1 tablet by mouth 3 (three) times daily as needed for moderate pain.         Marland Kitchen loratadine (CLARITIN) 10 MG tablet   Oral   Take 10 mg by mouth daily as needed for allergies.         . metFORMIN (GLUCOPHAGE) 500 MG tablet   Oral   Take 500 mg by mouth 2 (two) times daily with a meal.         . metoprolol succinate (TOPROL-XL) 25 MG 24 hr tablet   Oral   Take 25 mg by mouth daily.         Marland Kitchen omeprazole (PRILOSEC) 40 MG capsule   Oral   Take 40 mg by mouth daily.         . potassium chloride SA  (K-DUR,KLOR-CON) 20 MEQ tablet   Oral   Take 20 mEq by mouth daily.          . traMADol (ULTRAM) 50 MG tablet   Oral   Take 50 mg by mouth 3 (three) times daily as needed for pain. pain         . oxyCODONE-acetaminophen (PERCOCET/ROXICET) 5-325 MG per tablet   Oral   Take 1 tablet by mouth every 4 (four) hours as needed for severe pain.   15 tablet   0    BP 113/66  Pulse 76  Temp(Src) 98.5 F (36.9 C) (Oral)  Resp 18  SpO2 96% Physical Exam  Nursing note and vitals reviewed. Constitutional: She appears well-developed and well-nourished. No distress.  HENT:  Head: Normocephalic and atraumatic.  Neck: Normal range of motion. Neck supple.  Pulmonary/Chest: Effort normal. No respiratory distress.  Musculoskeletal: She exhibits edema and tenderness.       Right ankle: She exhibits swelling. She exhibits no deformity and normal pulse. Tenderness. Head of 5th metatarsal tenderness found. Achilles tendon exhibits no pain and no defect.       Feet:  Moderate swelling to the dorsal aspect of foot. Tenderness to palpation of the %th metatarsal   Skin: Skin is warm and dry. No abrasion and no ecchymosis noted. She is not diaphoretic. No erythema.  Psychiatric: She has a normal mood and affect. Her behavior is normal. Thought content normal.    ED Course  Procedures (including critical care time) Labs Review Labs Reviewed - No data to display Imaging Review Dg Ankle Complete Right  11/20/2013   CLINICAL DATA:  Anterior and lateral pain  EXAM: RIGHT ANKLE - COMPLETE 3+ VIEW  COMPARISON:  None.  FINDINGS: There is a nondisplaced fracture at the base of the fifth metatarsal. There is no dislocation. The ankle mortise is intact. There is a well corticated ossific fragment distal to the lateral malleolus likely representing sequela prior avulsive injury. Generalized soft tissue swelling.  IMPRESSION: Nondisplaced fracture of the base of the fifth metatarsal.   Electronically Signed   By:  Elbert Ewings  Patel   On: 11/20/2013 12:13   Dg Foot Complete Right  11/20/2013   CLINICAL DATA:  Dorsal and lateral pain  EXAM: RIGHT FOOT COMPLETE - 3+ VIEW  COMPARISON:  None.  FINDINGS: There is a nondisplaced fracture of the base of the right fifth metatarsal. There is no dislocation. There is hallux valgus. There is degenerative joint disease of the first MTP joint. Generalized soft tissue swelling.  IMPRESSION: Nondisplaced fracture of the base of the right fifth metatarsal.   Electronically Signed   By: Kathreen Devoid   On: 11/20/2013 12:12    EKG Interpretation   None       MDM   Final diagnoses:  Fracture of 5th metatarsal   Pt with a injury to right foot 3 days ago. Moderate swelling to dorsal foot.  Moderate tenderness to proximal aspect of 5th metatarsal. Will XR to evaluate for foot or possible ankle Fx. XR IMPRESSION: Nondisplaced fracture of the base of the fifth metatarsal. Discussed patient history, condition, and imagings with Dr. Eulis Foster who agrees the patient can be evaluated as an out-pt. Follow up with orthopedics, CAM walker, crutches given. Discussed lab results, imaging results, and treatment plan with the patient. Return precautions given. Reports understanding and no other concerns at this time.  Patient is stable for discharge at this time.   Meds given in ED:  Medications - No data to display  New Prescriptions   OXYCODONE-ACETAMINOPHEN (PERCOCET/ROXICET) 5-325 MG PER TABLET    Take 1 tablet by mouth every 4 (four) hours as needed for severe pain.      New Prescriptions   OXYCODONE-ACETAMINOPHEN (PERCOCET/ROXICET) 5-325 MG PER TABLET    Take 1 tablet by mouth every 4 (four) hours as needed for severe pain.      Lorrine Kin, PA-C 11/22/13 1204

## 2013-11-20 NOTE — ED Notes (Signed)
Pt reported that she twisted her r/ankle while stepping down her front step 2 days ago. Denies fall

## 2013-11-22 NOTE — ED Provider Notes (Signed)
Medical screening examination/treatment/procedure(s) were performed by non-physician practitioner and as supervising physician I was immediately available for consultation/collaboration.  Richarda Blade, MD 11/22/13 1455

## 2014-02-13 ENCOUNTER — Encounter: Payer: Self-pay | Admitting: *Deleted

## 2014-02-16 ENCOUNTER — Ambulatory Visit: Payer: Medicaid Other | Admitting: Internal Medicine

## 2014-03-13 ENCOUNTER — Ambulatory Visit (INDEPENDENT_AMBULATORY_CARE_PROVIDER_SITE_OTHER): Payer: Medicaid Other | Admitting: Internal Medicine

## 2014-03-13 ENCOUNTER — Encounter: Payer: Self-pay | Admitting: Internal Medicine

## 2014-03-13 VITALS — BP 126/90 | HR 78 | Ht 66.0 in | Wt 320.2 lb

## 2014-03-13 DIAGNOSIS — I10 Essential (primary) hypertension: Secondary | ICD-10-CM

## 2014-03-13 DIAGNOSIS — E1159 Type 2 diabetes mellitus with other circulatory complications: Secondary | ICD-10-CM | POA: Insufficient documentation

## 2014-03-13 DIAGNOSIS — E785 Hyperlipidemia, unspecified: Secondary | ICD-10-CM

## 2014-03-13 NOTE — Progress Notes (Signed)
OFFICE NOTE  Chief Complaint:  Establish new cardiologist  Primary Care Physician: Harvie Junior, MD  HPI:  [name] is a pleasant 53 year old female presents to follow by Dr. Rollene Fare who is establishing care with me today. Her past medical history significant for morbid obesity, dyslipidemia, diabetes, hypertension, and some anxiety. She also has gout. She initially established care with Dr. Rollene Fare after remote history of DVT and was on Coumadin for one to 2 years. She stopped in 2013 and has been maintained on aspirin. She had atypical chest pain in the past and underwent a stress test in 2013 which was negative for ischemia. Unfortunately she's been not been able to lose any significant weight. She does have a history of murmur and a 2013 had an echo which showed mild tricuspid regurgitation, trace aortic regurgitation and trace mitral regurgitation. There is signs of hypertensive heart disease with moderate concentric LVH, EF greater than 89%, mild diastolic dysfunction and moderately dilated left atrial size.  She is without complaint today.   PMHx:  Past Medical History  Diagnosis Date  . Hypertension 4 months  . Hypercholesteremia   . GERD (gastroesophageal reflux disease)   . DVT (deep venous thrombosis)     remote - took coumadin  . Prediabetes   . Morbid obesity   . Anxiety   . Substance abuse   . Fibroid   . Menometrorrhagia   . Dysmenorrhea   . Dysrhythmia     irregular heart beat  . Diabetes mellitus without complication     recent dx   . Arthritis   . Anemia     Past Surgical History  Procedure Laterality Date  . Rotator cuff repair Bilateral 2009  . Abdominal hysterectomy  2008  . Ectopic pregnancy surgery  1980's  . Cholecystectomy    . Tonsillectomy    . Tubal ligation    . Tooth extraction N/A 07/03/2013    Procedure: DENTAL EXTRACTION # 20;  Surgeon: Gae Bon, DDS;  Location: [facility];  Service: Oral Surgery;  Laterality: N/A;    . Mandible osteotomy Bilateral 07/03/2013    Procedure: BILATERAL TORI;  Surgeon: Gae Bon, DDS;  Location: [facility];  Service: Oral Surgery;  Laterality: Bilateral;  . Transthoracic echocardiogram  07/2012    EF=>55%, mod conc LVH; LA mod dilated; trace MR; mild TR with normal RVSP; trace AV regurg  . Nm myocar perf wall motion  07/2012    lexiscan - normal pattern of perfusion, low risk  . Sleep study  07/27/2012    AHI 4.8/hr    FAMHx:  Family History  Problem Relation Age of Onset  . Heart disease      No family history  . Diabetes Mother   . Hypertension Mother   . Diabetes Father   . Hypertension Father   . Cancer Father 54  . Cancer Brother 21    SOCHx:   reports that she quit smoking about 3 years ago. She has never used smokeless tobacco. She reports that she does not drink alcohol or use illicit drugs.  ALLERGIES:  Allergies  Allergen Reactions  . Buprenorphine Hcl-Naloxone Hcl Hives and Shortness Of Breath  . Dilaudid [Hydromorphone Hcl] Hives  . Naloxone     ROS: A comprehensive review of systems was negative.  HOME MEDS: Current Outpatient Prescriptions  Medication Sig Dispense Refill  . allopurinol (ZYLOPRIM) 100 MG tablet Take 100 mg by mouth daily.      Marland Kitchen aspirin  81 MG chewable tablet Chew 81 mg by mouth daily.      Marland Kitchen atorvastatin (LIPITOR) 40 MG tablet Take 40 mg by mouth daily.      . busPIRone (BUSPAR) 7.5 MG tablet Take 7.5 mg by mouth 2 (two) times daily.       . cyanocobalamin 500 MCG tablet Take 500 mcg by mouth daily.      . cyclobenzaprine (FLEXERIL) 10 MG tablet Take 10 mg by mouth 2 (two) times daily as needed for muscle spasms. Muscle spasms      . furosemide (LASIX) 20 MG tablet Take 20 mg by mouth daily.      Marland Kitchen HYDROcodone-acetaminophen (NORCO) 10-325 MG per tablet Take 1 tablet by mouth 3 (three) times daily as needed for moderate pain.      Marland Kitchen loratadine (CLARITIN) 10 MG tablet Take 10 mg by mouth daily as needed for allergies.       . metFORMIN (GLUCOPHAGE) 500 MG tablet Take 500 mg by mouth 2 (two) times daily with a meal.      . metoprolol succinate (TOPROL-XL) 25 MG 24 hr tablet Take 25 mg by mouth daily.      . Omega-3 Fatty Acids (FISH OIL) 1000 MG CAPS Take 1 capsule by mouth daily.      Marland Kitchen omeprazole (PRILOSEC) 40 MG capsule Take 40 mg by mouth daily.      . potassium chloride SA (K-DUR,KLOR-CON) 20 MEQ tablet Take 20 mEq by mouth daily.       . traMADol (ULTRAM) 50 MG tablet Take 50 mg by mouth 3 (three) times daily as needed for pain. pain       No current facility-administered medications for this visit.    LABS/IMAGING: No results found for this or any previous visit (from the past 48 hour(s)). No results found.  VITALS: BP 126/90  Pulse 78  Ht 5\' 6"  (1.676 m)  Wt 320 lb 3.2 oz (145.242 kg)  BMI 51.71 kg/m2  EXAM: General appearance: alert and no distress Neck: no carotid bruit, no JVD and thyroid not enlarged, symmetric, no tenderness/mass/nodules Lungs: clear to auscultation bilaterally Heart: regular rate and rhythm, S1, S2 normal, no murmur, click, rub or gallop Abdomen: soft, non-tender; bowel sounds normal; no masses,  no organomegaly and morbidly obese Extremities: extremities normal, atraumatic, no cyanosis or edema Pulses: 2+ and symmetric Skin: Skin color, texture, turgor normal. No rashes or lesions Neurologic: Grossly normal Psych: Mood, affect normal  EKG: Sinus rhythm at 78  ASSESSMENT: 1. Dyslipidemia 2. Morbid obesity 3. Diabetes type 2 4. Hypertension  PLAN: 1.   Mrs. Martenson is doing well without any new complaints. Unfortunately she has not been able to lose weight. Her cholesterol is followed by her primary care provider. Her diabetes has been fairly well controlled and blood pressure is at goal. She does have hypertensive heart disease and will need strict blood pressure control based on her echo findings. No changes are recommended to her medications at this time and  I'll plan to see her back in 6 months.  Pixie Casino, MD, [facility] Attending Cardiologist [place] 03/13/2014, 3:39 PM

## 2014-03-13 NOTE — Patient Instructions (Signed)
Your physician wants you to follow-up in:  6 months. You will receive a reminder letter in the mail two months in advance. If you don't receive a letter, please call our office to schedule the follow-up appointment.   

## 2014-06-21 ENCOUNTER — Telehealth: Payer: Self-pay | Admitting: Internal Medicine

## 2014-06-25 NOTE — Telephone Encounter (Signed)
Closed encounter °

## 2014-07-10 ENCOUNTER — Emergency Department (HOSPITAL_COMMUNITY)
Admission: EM | Admit: 2014-07-10 | Discharge: 2014-07-10 | Disposition: A | Discharge status: Home / Self Care | Payer: Medicaid Other | Attending: Emergency Medicine | Admitting: Emergency Medicine

## 2014-07-10 ENCOUNTER — Encounter (HOSPITAL_COMMUNITY): Payer: Self-pay | Admitting: Emergency Medicine

## 2014-07-10 DIAGNOSIS — S335XXA Sprain of ligaments of lumbar spine, initial encounter: Secondary | ICD-10-CM | POA: Insufficient documentation

## 2014-07-10 DIAGNOSIS — Z79899 Other long term (current) drug therapy: Secondary | ICD-10-CM | POA: Insufficient documentation

## 2014-07-10 DIAGNOSIS — M109 Gout, unspecified: Secondary | ICD-10-CM | POA: Diagnosis not present

## 2014-07-10 DIAGNOSIS — S39012A Strain of muscle, fascia and tendon of lower back, initial encounter: Secondary | ICD-10-CM

## 2014-07-10 DIAGNOSIS — K219 Gastro-esophageal reflux disease without esophagitis: Secondary | ICD-10-CM | POA: Diagnosis not present

## 2014-07-10 DIAGNOSIS — S8990XA Unspecified injury of unspecified lower leg, initial encounter: Secondary | ICD-10-CM | POA: Insufficient documentation

## 2014-07-10 DIAGNOSIS — D649 Anemia, unspecified: Secondary | ICD-10-CM | POA: Diagnosis not present

## 2014-07-10 DIAGNOSIS — E119 Type 2 diabetes mellitus without complications: Secondary | ICD-10-CM | POA: Diagnosis not present

## 2014-07-10 DIAGNOSIS — Y9229 Other specified public building as the place of occurrence of the external cause: Secondary | ICD-10-CM | POA: Insufficient documentation

## 2014-07-10 DIAGNOSIS — I10 Essential (primary) hypertension: Secondary | ICD-10-CM | POA: Insufficient documentation

## 2014-07-10 DIAGNOSIS — Z87891 Personal history of nicotine dependence: Secondary | ICD-10-CM | POA: Diagnosis not present

## 2014-07-10 DIAGNOSIS — Z86718 Personal history of other venous thrombosis and embolism: Secondary | ICD-10-CM | POA: Insufficient documentation

## 2014-07-10 DIAGNOSIS — Y9301 Activity, walking, marching and hiking: Secondary | ICD-10-CM | POA: Insufficient documentation

## 2014-07-10 DIAGNOSIS — IMO0002 Reserved for concepts with insufficient information to code with codable children: Secondary | ICD-10-CM | POA: Diagnosis present

## 2014-07-10 DIAGNOSIS — Z791 Long term (current) use of non-steroidal anti-inflammatories (NSAID): Secondary | ICD-10-CM | POA: Diagnosis not present

## 2014-07-10 DIAGNOSIS — Z8742 Personal history of other diseases of the female genital tract: Secondary | ICD-10-CM | POA: Diagnosis not present

## 2014-07-10 DIAGNOSIS — E7800 Pure hypercholesterolemia, unspecified: Secondary | ICD-10-CM | POA: Diagnosis not present

## 2014-07-10 DIAGNOSIS — F411 Generalized anxiety disorder: Secondary | ICD-10-CM | POA: Diagnosis not present

## 2014-07-10 DIAGNOSIS — Z7982 Long term (current) use of aspirin: Secondary | ICD-10-CM | POA: Diagnosis not present

## 2014-07-10 DIAGNOSIS — S99929A Unspecified injury of unspecified foot, initial encounter: Secondary | ICD-10-CM | POA: Diagnosis not present

## 2014-07-10 DIAGNOSIS — X500XXA Overexertion from strenuous movement or load, initial encounter: Secondary | ICD-10-CM | POA: Diagnosis not present

## 2014-07-10 DIAGNOSIS — S99919A Unspecified injury of unspecified ankle, initial encounter: Secondary | ICD-10-CM

## 2014-07-10 HISTORY — DX: Gout, unspecified: M10.9

## 2014-07-10 MED ORDER — NAPROXEN 500 MG PO TABS
500.0000 mg | ORAL_TABLET | Freq: Two times a day (BID) | ORAL | Status: DC
Start: 2014-07-10 — End: 2014-07-10
  Administered 2014-07-10: 500 mg via ORAL
  Filled 2014-07-10: qty 1

## 2014-07-10 MED ORDER — NAPROXEN 500 MG PO TABS: 500.0000 mg | ORAL_TABLET | Freq: Two times a day (BID) | ORAL | Status: DC

## 2014-07-10 NOTE — ED Notes (Signed)
Patient c/o right lower back pain that radiates down the right leg. Patient states the pain started yesterday. Patient denies any numbness or tingling of extremities.

## 2014-07-10 NOTE — ED Provider Notes (Signed)
CSN: [id number]     Arrival date & time 07/10/14  1027 History   None    Chief Complaint  Patient presents with  . Back Pain  . Leg Pain     (Consider location/radiation/quality/duration/timing/severity/associated sxs/prior Treatment) HPI LASHAI GROSCH is a 53 y.o. female history of chronic back pain followed by her primary care and orthopedist comes in today for acute exacerbation of back pain. Patient states last night at 6 PM she was walking around the mall and got some ice cream when her low back on her left side started to hurt. She characterizes it as a constant dull ache that gets sharp when she moves a certain way. She is taking hydrocodone for the pain without any relief. No chronic steroid use, history of cancer, IV drug abuse, numbness or weakness, loss of bowel or bladder or other concerning symptoms of back pain.  Past Medical History  Diagnosis Date  . Hypertension 4 months  . Hypercholesteremia   . GERD (gastroesophageal reflux disease)   . DVT (deep venous thrombosis)     remote - took coumadin  . Prediabetes   . Morbid obesity   . Anxiety   . Substance abuse   . Fibroid   . Menometrorrhagia   . Dysmenorrhea   . Dysrhythmia     irregular heart beat  . Diabetes mellitus without complication     recent dx   . Arthritis   . Anemia   . Gout    Past Surgical History  Procedure Laterality Date  . Rotator cuff repair Bilateral 2009  . Abdominal hysterectomy  2008  . Ectopic pregnancy surgery  1980's  . Cholecystectomy    . Tonsillectomy    . Tubal ligation    . Tooth extraction N/A 07/03/2013    Procedure: DENTAL EXTRACTION # 20;  Surgeon: Gae Bon, DDS;  Location: [facility];  Service: Oral Surgery;  Laterality: N/A;  . Mandible osteotomy Bilateral 07/03/2013    Procedure: BILATERAL TORI;  Surgeon: Gae Bon, DDS;  Location: [facility];  Service: Oral Surgery;  Laterality: Bilateral;  . Transthoracic echocardiogram  07/2012    EF=>55%, mod conc LVH; LA  mod dilated; trace MR; mild TR with normal RVSP; trace AV regurg  . Nm myocar perf wall motion  07/2012    lexiscan - normal pattern of perfusion, low risk  . Sleep study  07/27/2012    AHI 4.8/hr   Family History  Problem Relation Age of Onset  . Heart disease      No family history  . Diabetes Mother   . Hypertension Mother   . Diabetes Father   . Hypertension Father   . Cancer Father 32  . Cancer Brother 44   History  Substance Use Topics  . Smoking status: Former Smoker    Quit date: 02/14/2011  . Smokeless tobacco: Never Used  . Alcohol Use: No     Comment: Previous ETOH abuse   OB History   Grav Para Term Preterm Abortions TAB SAB Ect Mult Living   8 6   1   1        Review of Systems  Constitutional: Negative for fever.  HENT: Negative for sore throat.   Eyes: Negative for visual disturbance.  Respiratory: Negative for shortness of breath.   Cardiovascular: Negative for chest pain.  Gastrointestinal: Negative for abdominal pain.  Endocrine: Negative for polyuria.  Genitourinary: Negative for dysuria and flank pain.  Musculoskeletal: Positive for back pain.  Skin: Negative for rash.  Neurological: Negative for weakness, numbness and headaches.      Allergies  Buprenorphine hcl-naloxone hcl; Naloxone; and Dilaudid  Home Medications   Prior to Admission medications   Medication Sig Start Date End Date Taking? Authorizing Provider  allopurinol (ZYLOPRIM) 100 MG tablet Take 100 mg by mouth daily.   Yes Historical Provider, MD  aspirin 81 MG chewable tablet Chew 81 mg by mouth daily.   Yes Historical Provider, MD  atorvastatin (LIPITOR) 40 MG tablet Take 40 mg by mouth daily.   Yes Historical Provider, MD  busPIRone (BUSPAR) 7.5 MG tablet Take 7.5 mg by mouth 2 (two) times daily.    Yes Historical Provider, MD  cyanocobalamin 500 MCG tablet Take 500 mcg by mouth daily.   Yes Historical Provider, MD  cyclobenzaprine (FLEXERIL) 10 MG tablet Take 10 mg by mouth  2 (two) times daily as needed for muscle spasms (muscle spasms). Muscle spasms   Yes Historical Provider, MD  diclofenac sodium (VOLTAREN) 1 % GEL Apply 2 g topically 2 (two) times daily.   Yes Historical Provider, MD  furosemide (LASIX) 20 MG tablet Take 20 mg by mouth daily.   Yes Historical Provider, MD  HYDROcodone-acetaminophen (NORCO) 10-325 MG per tablet Take 1.5 tablets by mouth 3 (three) times daily as needed for moderate pain (back pain).    Yes Historical Provider, MD  loratadine (CLARITIN) 10 MG tablet Take 10 mg by mouth daily as needed for allergies (allergies).    Yes Historical Provider, MD  metFORMIN (GLUCOPHAGE) 500 MG tablet Take 500 mg by mouth 2 (two) times daily with a meal.   Yes Historical Provider, MD  metoprolol succinate (TOPROL-XL) 25 MG 24 hr tablet Take 25 mg by mouth daily. 03/10/13  Yes Rebecca Eaton, MD  Omega-3 Fatty Acids (FISH OIL) 1000 MG CAPS Take 1 capsule by mouth daily.   Yes Historical Provider, MD  omeprazole (PRILOSEC) 40 MG capsule Take 40 mg by mouth daily.   Yes Historical Provider, MD  potassium chloride SA (K-DUR,KLOR-CON) 20 MEQ tablet Take 20 mEq by mouth daily.    Yes Historical Provider, MD  traMADol (ULTRAM) 50 MG tablet Take 100 mg by mouth 3 (three) times daily as needed for moderate pain or severe pain (infllammation in hips). pain   Yes Historical Provider, MD   BP 145/71  Pulse 81  Temp(Src) 98.3 F (36.8 C) (Oral)  Resp 20  Ht 5' 6.5" (1.689 m)  Wt 303 lb (137.44 kg)  BMI 48.18 kg/m2  SpO2 93% Physical Exam  Nursing note and vitals reviewed. Constitutional:  Awake, alert, nontoxic appearance with baseline speech.  HENT:  Head: Atraumatic.  Eyes: Pupils are equal, round, and reactive to light. Right eye exhibits no discharge. Left eye exhibits no discharge.  Neck: Neck supple.  Cardiovascular: Normal rate and regular rhythm.   No murmur heard. Pulmonary/Chest: Effort normal and breath sounds normal. No respiratory  distress. She has no wheezes. She has no rales. She exhibits no tenderness.  Abdominal: Soft. Bowel sounds are normal. She exhibits no mass. There is no tenderness. There is no rebound.  No pulsatile masses appreciated  Musculoskeletal:       Thoracic back: She exhibits no tenderness.       Lumbar back: She exhibits no tenderness.  Bilateral lower extremities non tender without new rashes or color change, baseline ROM with intact DP / PT pulses, CR<2 secs all digits bilaterally, sensation baseline light touch bilaterally for pt,  motor symmetric bilateral 5 / 5 hip flexion, quadriceps, hamstrings, EHL, foot dorsiflexion, foot plantarflexion, gait somewhat antalgic but without apparent new ataxia. No bony spine tenderness. Pain is reproduced with palpation of left lumbar paravertebral muscles.  Neurological:  Mental status baseline for patient.  Upper extremity motor strength and sensation intact and symmetric bilaterally.  Skin: No rash noted.  Psychiatric: She has a normal mood and affect.    ED Course  Procedures (including critical care time) Labs Review Labs Reviewed - No data to display  Imaging Review No results found.   EKG Interpretation None      MDM  Vitals stable - WNL -afebrile Pt resting comfortably in ED. PE and clinical picture not concerning for acute or emergent pathology of back pain. Pain not consistent with sciatica, appears to be lower lumbar strain. No back pain red flags. No concern for kidney pathology as source of back pain. Patient has no hematuria or dysuria or CVA tenderness, no complaints of flank pain. AAA less likely due to location of pain in patient presentation. Patient also reports colonoscopy last year with normal findings.  Will DC with Aleve, RICE and have her followup with her primary care and/or orthopedist. She reports having an appointment with her orthopedist next week. Discussed f/u with PCP and return precautions, pt very amenable to  plan.   Final diagnoses:  Low back strain, initial encounter        Verl Dicker, PA-C 07/11/14 1059

## 2014-07-11 NOTE — ED Provider Notes (Signed)
Medical screening examination/treatment/procedure(s) were performed by non-physician practitioner and as supervising physician I was immediately available for consultation/collaboration.  Carmin Muskrat, MD 07/11/14 [phone]

## 2014-08-13 ENCOUNTER — Encounter (HOSPITAL_COMMUNITY): Payer: Self-pay | Admitting: Emergency Medicine

## 2014-08-22 ENCOUNTER — Other Ambulatory Visit (HOSPITAL_COMMUNITY): Payer: Self-pay | Admitting: Specialist

## 2014-08-22 DIAGNOSIS — Z1231 Encounter for screening mammogram for malignant neoplasm of breast: Secondary | ICD-10-CM

## 2014-08-30 ENCOUNTER — Ambulatory Visit (HOSPITAL_COMMUNITY)
Admission: RE | Admit: 2014-08-30 | Discharge: 2014-08-30 | Disposition: A | Discharge status: Home / Self Care | Payer: Medicaid Other | Source: Ambulatory Visit | Attending: Specialist | Admitting: Specialist

## 2014-08-30 DIAGNOSIS — Z1231 Encounter for screening mammogram for malignant neoplasm of breast: Secondary | ICD-10-CM | POA: Diagnosis present

## 2014-09-03 ENCOUNTER — Encounter: Payer: Self-pay | Admitting: Internal Medicine

## 2014-09-03 ENCOUNTER — Ambulatory Visit (INDEPENDENT_AMBULATORY_CARE_PROVIDER_SITE_OTHER): Payer: Medicaid Other | Admitting: Internal Medicine

## 2014-09-03 DIAGNOSIS — I10 Essential (primary) hypertension: Secondary | ICD-10-CM

## 2014-09-03 DIAGNOSIS — E785 Hyperlipidemia, unspecified: Secondary | ICD-10-CM

## 2014-09-03 NOTE — Patient Instructions (Signed)
Your physician wants you to follow-up in: 1 year with Dr. Hilty. You will receive a reminder letter in the mail two months in advance. If you don't receive a letter, please call our office to schedule the follow-up appointment.  

## 2014-09-03 NOTE — Progress Notes (Signed)
OFFICE NOTE  Chief Complaint:  Establish new cardiologist  Primary Care Physician: Harvie Junior, MD  HPI:  [name] is a pleasant 53 year old female presents to follow by Dr. Rollene Fare who is establishing care with me today. Her past medical history significant for morbid obesity, dyslipidemia, diabetes, hypertension, and some anxiety. She also has gout. She initially established care with Dr. Rollene Fare after remote history of DVT and was on Coumadin for one to 2 years. She stopped in 2013 and has been maintained on aspirin. She had atypical chest pain in the past and underwent a stress test in 2013 which was negative for ischemia. Unfortunately she's been not been able to lose any significant weight. She does have a history of murmur and a 2013 had an echo which showed mild tricuspid regurgitation, trace aortic regurgitation and trace mitral regurgitation. There is signs of hypertensive heart disease with moderate concentric LVH, EF greater than 65%, mild diastolic dysfunction and moderately dilated left atrial size.   [name] returns today for follow-up. She is doing quite well. She's managed to lose at least 20 pounds since her last office visit. She is making great progress. Part of this may be due to her diabetes medicines and as well as dietary changes and exercise. I really commended her on her commitment to her health.  PMHx:  Past Medical History  Diagnosis Date  . Hypertension 4 months  . Hypercholesteremia   . GERD (gastroesophageal reflux disease)   . DVT (deep venous thrombosis)     remote - took coumadin  . Prediabetes   . Morbid obesity   . Anxiety   . Substance abuse   . Fibroid   . Menometrorrhagia   . Dysmenorrhea   . Dysrhythmia     irregular heart beat  . Diabetes mellitus without complication     recent dx   . Arthritis   . Anemia   . Gout     Past Surgical History  Procedure Laterality Date  . Rotator cuff repair Bilateral 2009    . Abdominal hysterectomy  2008  . Ectopic pregnancy surgery  1980's  . Cholecystectomy    . Tonsillectomy    . Tubal ligation    . Tooth extraction N/A 07/03/2013    Procedure: DENTAL EXTRACTION # 20;  Surgeon: Gae Bon, DDS;  Location: [place];  Service: Oral Surgery;  Laterality: N/A;  . Mandible osteotomy Bilateral 07/03/2013    Procedure: BILATERAL TORI;  Surgeon: Gae Bon, DDS;  Location: [place];  Service: Oral Surgery;  Laterality: Bilateral;  . Transthoracic echocardiogram  07/2012    EF=>55%, mod conc LVH; LA mod dilated; trace MR; mild TR with normal RVSP; trace AV regurg  . Nm myocar perf wall motion  07/2012    lexiscan - normal pattern of perfusion, low risk  . Sleep study  07/27/2012    AHI 4.8/hr    FAMHx:  Family History  Problem Relation Age of Onset  . Heart disease      No family history  . Diabetes Mother   . Hypertension Mother   . Diabetes Father   . Hypertension Father   . Cancer Father 59  . Cancer Brother 66    SOCHx:   reports that she quit smoking about 3 years ago. She has never used smokeless tobacco. She reports that she does not drink alcohol or use illicit drugs.  ALLERGIES:  Allergies  Allergen Reactions  . Buprenorphine Hcl-Naloxone Hcl Hives  and Shortness Of Breath  . Naloxone Hives and Shortness Of Breath  . Dilaudid [Hydromorphone Hcl] Hives    ROS: A comprehensive review of systems was negative.  HOME MEDS: Current Outpatient Prescriptions  Medication Sig Dispense Refill  . allopurinol (ZYLOPRIM) 100 MG tablet Take 100 mg by mouth daily.    Marland Kitchen aspirin 81 MG chewable tablet Chew 81 mg by mouth daily.    Marland Kitchen atorvastatin (LIPITOR) 40 MG tablet Take 40 mg by mouth daily.    . busPIRone (BUSPAR) 7.5 MG tablet Take 7.5 mg by mouth 2 (two) times daily.     . cyanocobalamin 500 MCG tablet Take 500 mcg by mouth daily.    . cyclobenzaprine (FLEXERIL) 10 MG tablet Take 10 mg by mouth 2 (two) times daily as needed for muscle spasms  (muscle spasms). Muscle spasms    . diclofenac sodium (VOLTAREN) 1 % GEL Apply 2 g topically 2 (two) times daily.    . furosemide (LASIX) 20 MG tablet Take 20 mg by mouth daily.    Marland Kitchen HYDROcodone-acetaminophen (NORCO) 10-325 MG per tablet Take 1.5 tablets by mouth 3 (three) times daily as needed for moderate pain (back pain).     Marland Kitchen loratadine (CLARITIN) 10 MG tablet Take 10 mg by mouth daily as needed for allergies (allergies).     . metFORMIN (GLUCOPHAGE) 500 MG tablet Take 500 mg by mouth 2 (two) times daily with a meal.    . metoprolol succinate (TOPROL-XL) 25 MG 24 hr tablet Take 25 mg by mouth daily.    . naproxen (NAPROSYN) 500 MG tablet Take 1 tablet (500 mg total) by mouth 2 (two) times daily. 30 tablet 0  . Omega-3 Fatty Acids (FISH OIL) 1000 MG CAPS Take 1 capsule by mouth daily.    Marland Kitchen omeprazole (PRILOSEC) 40 MG capsule Take 40 mg by mouth daily.    . potassium chloride SA (K-DUR,KLOR-CON) 20 MEQ tablet Take 20 mEq by mouth daily.     . traMADol (ULTRAM) 50 MG tablet Take 100 mg by mouth 3 (three) times daily as needed for moderate pain or severe pain (infllammation in hips). pain    . INVOKANA 100 MG TABS tablet Take 100 mg by mouth 2 (two) times daily.  5   No current facility-administered medications for this visit.    LABS/IMAGING: No results found for this or any previous visit (from the past 48 hour(s)). No results found.  VITALS: BP 108/70 mmHg  Pulse 71  Ht 5\' 6"  (1.676 m)  Wt 294 lb 9.6 oz (133.63 kg)  BMI 47.57 kg/m2  EXAM: General appearance: alert and no distress Neck: no carotid bruit, no JVD and thyroid not enlarged, symmetric, no tenderness/mass/nodules Lungs: clear to auscultation bilaterally Heart: regular rate and rhythm, S1, S2 normal, no murmur, click, rub or gallop Abdomen: soft, non-tender; bowel sounds normal; no masses,  no organomegaly and morbidly obese Extremities: extremities normal, atraumatic, no cyanosis or edema Pulses: 2+ and  symmetric Skin: Skin color, texture, turgor normal. No rashes or lesions Neurologic: Grossly normal Psych: Mood, affect normal  EKG: Sinus rhythm at 71  ASSESSMENT: 1. Dyslipidemia 2. Morbid obesity - significant recent weight loss 3. Diabetes type 2 4. Hypertension - borderline low  PLAN: 1.   [name] is doing well without any new complaints. She has made great progress with weight loss. Of encouraged her to continue working on this. Her diabetes is better controlled. Her cholesterol is followed by her primary care provider. Her blood  pressure is borderline low and if she loses another 20-30 pounds and may be able to decrease her Toprol in half. Otherwise she is doing great and I will plan to see her back annually.   Pixie Casino, MD, [facility] Attending Cardiologist [facility]  Jovin Fester C 09/03/2014, 10:51 AM

## 2014-09-07 ENCOUNTER — Emergency Department (HOSPITAL_COMMUNITY): Payer: Medicaid Other

## 2014-09-07 ENCOUNTER — Emergency Department (HOSPITAL_COMMUNITY)
Admission: EM | Admit: 2014-09-07 | Discharge: 2014-09-07 | Disposition: A | Discharge status: Home / Self Care | Payer: Medicaid Other | Attending: Emergency Medicine | Admitting: Emergency Medicine

## 2014-09-07 ENCOUNTER — Encounter (HOSPITAL_COMMUNITY): Payer: Self-pay

## 2014-09-07 DIAGNOSIS — I10 Essential (primary) hypertension: Secondary | ICD-10-CM | POA: Insufficient documentation

## 2014-09-07 DIAGNOSIS — E780 Pure hypercholesterolemia: Secondary | ICD-10-CM | POA: Diagnosis not present

## 2014-09-07 DIAGNOSIS — K219 Gastro-esophageal reflux disease without esophagitis: Secondary | ICD-10-CM | POA: Insufficient documentation

## 2014-09-07 DIAGNOSIS — E119 Type 2 diabetes mellitus without complications: Secondary | ICD-10-CM | POA: Diagnosis not present

## 2014-09-07 DIAGNOSIS — M1990 Unspecified osteoarthritis, unspecified site: Secondary | ICD-10-CM | POA: Diagnosis not present

## 2014-09-07 DIAGNOSIS — Z86718 Personal history of other venous thrombosis and embolism: Secondary | ICD-10-CM | POA: Insufficient documentation

## 2014-09-07 DIAGNOSIS — Z791 Long term (current) use of non-steroidal anti-inflammatories (NSAID): Secondary | ICD-10-CM | POA: Insufficient documentation

## 2014-09-07 DIAGNOSIS — Z87891 Personal history of nicotine dependence: Secondary | ICD-10-CM | POA: Insufficient documentation

## 2014-09-07 DIAGNOSIS — M546 Pain in thoracic spine: Secondary | ICD-10-CM | POA: Insufficient documentation

## 2014-09-07 DIAGNOSIS — Z7982 Long term (current) use of aspirin: Secondary | ICD-10-CM | POA: Diagnosis not present

## 2014-09-07 DIAGNOSIS — Z8742 Personal history of other diseases of the female genital tract: Secondary | ICD-10-CM | POA: Diagnosis not present

## 2014-09-07 DIAGNOSIS — F419 Anxiety disorder, unspecified: Secondary | ICD-10-CM | POA: Insufficient documentation

## 2014-09-07 DIAGNOSIS — D649 Anemia, unspecified: Secondary | ICD-10-CM | POA: Insufficient documentation

## 2014-09-07 DIAGNOSIS — Z79899 Other long term (current) drug therapy: Secondary | ICD-10-CM | POA: Diagnosis not present

## 2014-09-07 DIAGNOSIS — M109 Gout, unspecified: Secondary | ICD-10-CM | POA: Diagnosis not present

## 2014-09-07 LAB — URINALYSIS, ROUTINE W REFLEX MICROSCOPIC
Bilirubin Urine: NEGATIVE
Hgb urine dipstick: NEGATIVE
Ketones, ur: NEGATIVE mg/dL
LEUKOCYTES UA: NEGATIVE
NITRITE: NEGATIVE
PROTEIN: NEGATIVE mg/dL
Specific Gravity, Urine: 1.018 (ref 1.005–1.030)
Urobilinogen, UA: 0.2 mg/dL (ref 0.0–1.0)
pH: 5 (ref 5.0–8.0)

## 2014-09-07 LAB — URINE MICROSCOPIC-ADD ON

## 2014-09-07 MED ORDER — NAPROXEN 500 MG PO TABS: 500.0000 mg | ORAL_TABLET | Freq: Two times a day (BID) | ORAL | Status: DC

## 2014-09-07 MED ORDER — HYDROCODONE-ACETAMINOPHEN 5-325 MG PO TABS
2.0000 | ORAL_TABLET | Freq: Once | ORAL | Status: AC
  Administered 2014-09-07: 2 via ORAL
  Filled 2014-09-07: qty 2

## 2014-09-07 MED ORDER — NAPROXEN 500 MG PO TABS
500.0000 mg | ORAL_TABLET | Freq: Once | ORAL | Status: AC
  Administered 2014-09-07: 500 mg via ORAL
  Filled 2014-09-07: qty 1

## 2014-09-07 MED ORDER — HYDROCODONE-ACETAMINOPHEN 5-325 MG PO TABS: 1.0000 | ORAL_TABLET | ORAL | Status: DC | PRN

## 2014-09-07 NOTE — ED Notes (Signed)
Per pt, has had back pain for over a week.  Was seen by MD on Friday and labs taken.  Kidneys "were ok".  States pain continues in upper right of back.  Denies injury.  No fever and no change in urination.

## 2014-09-07 NOTE — Discharge Instructions (Signed)
Back Pain, Adult Low back pain is very common. About 1 in 5 people have back pain.The cause of low back pain is rarely dangerous. The pain often gets better over time.About half of people with a sudden onset of back pain feel better in just 2 weeks. About 8 in 10 people feel better by 6 weeks.  CAUSES Some common causes of back pain include:  Strain of the muscles or ligaments supporting the spine.  Wear and tear (degeneration) of the spinal discs.  Arthritis.  Direct injury to the back. DIAGNOSIS Most of the time, the direct cause of low back pain is not known.However, back pain can be treated effectively even when the exact cause of the pain is unknown.Answering your caregiver's questions about your overall health and symptoms is one of the most accurate ways to make sure the cause of your pain is not dangerous. If your caregiver needs more information, he or she may order lab work or imaging tests (X-rays or MRIs).However, even if imaging tests show changes in your back, this usually does not require surgery. HOME CARE INSTRUCTIONS For many people, back pain returns.Since low back pain is rarely dangerous, it is often a condition that people can learn to manageon their own.   Remain active. It is stressful on the back to sit or stand in one place. Do not sit, drive, or stand in one place for more than 30 minutes at a time. Take short walks on level surfaces as soon as pain allows.Try to increase the length of time you walk each day.  Do not stay in bed.Resting more than 1 or 2 days can delay your recovery.  Do not avoid exercise or work.Your body is made to move.It is not dangerous to be active, even though your back may hurt.Your back will likely heal faster if you return to being active before your pain is gone.  Pay attention to your body when you bend and lift. Many people have less discomfortwhen lifting if they bend their knees, keep the load close to their bodies,and  avoid twisting. Often, the most comfortable positions are those that put less stress on your recovering back.  Find a comfortable position to sleep. Use a firm mattress and lie on your side with your knees slightly bent. If you lie on your back, put a pillow under your knees.  Only take over-the-counter or prescription medicines as directed by your caregiver. Over-the-counter medicines to reduce pain and inflammation are often the most helpful.Your caregiver may prescribe muscle relaxant drugs.These medicines help dull your pain so you can more quickly return to your normal activities and healthy exercise.  Put ice on the injured area.  Put ice in a plastic bag.  Place a towel between your skin and the bag.  Leave the ice on for 15-20 minutes, 03-04 times a day for the first 2 to 3 days. After that, ice and heat may be alternated to reduce pain and spasms.  Ask your caregiver about trying back exercises and gentle massage. This may be of some benefit.  Avoid feeling anxious or stressed.Stress increases muscle tension and can worsen back pain.It is important to recognize when you are anxious or stressed and learn ways to manage it.Exercise is a great option. SEEK MEDICAL CARE IF:  You have pain that is not relieved with rest or medicine.  You have pain that does not improve in 1 week.  You have new symptoms.  You are generally not feeling well. SEEK   IMMEDIATE MEDICAL CARE IF:   You have pain that radiates from your back into your legs.  You develop new bowel or bladder control problems.  You have unusual weakness or numbness in your arms or legs.  You develop nausea or vomiting.  You develop abdominal pain.  You feel faint. Document Released: 09/28/2005 Document Revised: 03/29/2012 Document Reviewed: 01/30/2014 Exit[employer] Patient Information 2015 [employer]. This information is not intended to replace advice given to you by your health care provider. Make sure you  discuss any questions you have with your health care provider.  

## 2014-09-07 NOTE — ED Notes (Signed)
Offered pt a heat pack or ice pack to place on back to help relieve back pain. Pt denied, stating nothing could help except pain medication. Gave pt prescribed pain medication.

## 2014-09-07 NOTE — ED Provider Notes (Signed)
CSN: [id number]     Arrival date & time 09/07/14  1209 History   First MD Initiated Contact with Patient 09/07/14 1421     Chief Complaint  Patient presents with  . Back Pain     (Consider location/radiation/quality/duration/timing/severity/associated sxs/prior Treatment) HPI  The patient has had back pain for over a week now. She reports the pain is in her right thoracic back. She reports is very sharp in nature. It exacerbated by position change. If she turns a certain way it will just send a shooting sharp pain. The patient denies she has any associated shortness of breath. She denies she's had any cough or fever. She denies any lower extremity pain or swelling. She has had problems with low back pain in the past but she denies she's had pain in that specific area.  Past Medical History  Diagnosis Date  . Hypertension 4 months  . Hypercholesteremia   . GERD (gastroesophageal reflux disease)   . DVT (deep venous thrombosis)     remote - took coumadin  . Prediabetes   . Morbid obesity   . Anxiety   . Substance abuse   . Fibroid   . Menometrorrhagia   . Dysmenorrhea   . Dysrhythmia     irregular heart beat  . Diabetes mellitus without complication     recent dx   . Arthritis   . Anemia   . Gout    Past Surgical History  Procedure Laterality Date  . Rotator cuff repair Bilateral 2009  . Abdominal hysterectomy  2008  . Ectopic pregnancy surgery  1980's  . Cholecystectomy    . Tonsillectomy    . Tubal ligation    . Tooth extraction N/A 07/03/2013    Procedure: DENTAL EXTRACTION # 20;  Surgeon: Gae Bon, DDS;  Location: [place];  Service: Oral Surgery;  Laterality: N/A;  . Mandible osteotomy Bilateral 07/03/2013    Procedure: BILATERAL TORI;  Surgeon: Gae Bon, DDS;  Location: [place];  Service: Oral Surgery;  Laterality: Bilateral;  . Transthoracic echocardiogram  07/2012    EF=>55%, mod conc LVH; LA mod dilated; trace MR; mild TR with normal RVSP; trace AV regurg   . Nm myocar perf wall motion  07/2012    lexiscan - normal pattern of perfusion, low risk  . Sleep study  07/27/2012    AHI 4.8/hr   Family History  Problem Relation Age of Onset  . Heart disease      No family history  . Diabetes Mother   . Hypertension Mother   . Diabetes Father   . Hypertension Father   . Cancer Father 34  . Cancer Brother 61   History  Substance Use Topics  . Smoking status: Former Smoker    Quit date: 02/14/2011  . Smokeless tobacco: Never Used  . Alcohol Use: No     Comment: Previous ETOH abuse   OB History    Gravida Para Term Preterm AB TAB SAB Ectopic Multiple Living   8 6   1   1        Review of Systems  10 Systems reviewed and are negative for acute change except as noted in the HPI.   Allergies  Buprenorphine hcl-naloxone hcl; Naloxone; and Dilaudid  Home Medications   Prior to Admission medications   Medication Sig Start Date End Date Taking? Authorizing Provider  allopurinol (ZYLOPRIM) 100 MG tablet Take 100 mg by mouth daily.    Historical Provider, MD  aspirin 81  MG chewable tablet Chew 81 mg by mouth daily.    Historical Provider, MD  atorvastatin (LIPITOR) 40 MG tablet Take 40 mg by mouth daily.    Historical Provider, MD  busPIRone (BUSPAR) 7.5 MG tablet Take 7.5 mg by mouth 2 (two) times daily.     Historical Provider, MD  cyanocobalamin 500 MCG tablet Take 500 mcg by mouth daily.    Historical Provider, MD  cyclobenzaprine (FLEXERIL) 10 MG tablet Take 10 mg by mouth 2 (two) times daily as needed for muscle spasms (muscle spasms). Muscle spasms    Historical Provider, MD  diclofenac sodium (VOLTAREN) 1 % GEL Apply 2 g topically 2 (two) times daily.    Historical Provider, MD  furosemide (LASIX) 20 MG tablet Take 20 mg by mouth daily.    Historical Provider, MD  HYDROcodone-acetaminophen (NORCO/VICODIN) 5-325 MG per tablet Take 1-2 tablets by mouth every 4 (four) hours as needed for moderate pain or severe pain. 09/07/14   Charlesetta Shanks, MD  INVOKANA 100 MG TABS tablet Take 100 mg by mouth 2 (two) times daily. 08/20/14   Historical Provider, MD  loratadine (CLARITIN) 10 MG tablet Take 10 mg by mouth daily as needed for allergies (allergies).     Historical Provider, MD  metFORMIN (GLUCOPHAGE) 500 MG tablet Take 500 mg by mouth 2 (two) times daily with a meal.    Historical Provider, MD  metoprolol succinate (TOPROL-XL) 25 MG 24 hr tablet Take 25 mg by mouth daily. 03/10/13   Rebecca Eaton, MD  naproxen (NAPROSYN) 500 MG tablet Take 1 tablet (500 mg total) by mouth 2 (two) times daily. 09/07/14   Charlesetta Shanks, MD  Omega-3 Fatty Acids (FISH OIL) 1000 MG CAPS Take 1 capsule by mouth daily.    Historical Provider, MD  omeprazole (PRILOSEC) 40 MG capsule Take 40 mg by mouth daily.    Historical Provider, MD  potassium chloride SA (K-DUR,KLOR-CON) 20 MEQ tablet Take 20 mEq by mouth daily.     Historical Provider, MD  traMADol (ULTRAM) 50 MG tablet Take 100 mg by mouth 3 (three) times daily as needed for moderate pain or severe pain (infllammation in hips). pain    Historical Provider, MD   BP 107/64 mmHg  Pulse 68  Temp(Src) 97.4 F (36.3 C) (Oral)  Resp 20  SpO2 96% Physical Exam  Constitutional: She is oriented to person, place, and time.  Patient is obese but well-nourished and well-developed. She is ambulatory in the emergency department. She is nontoxic and has no respiratory distress.  HENT:  Head: Normocephalic and atraumatic.  Eyes: EOM are normal.  Neck: Neck supple.  Cardiovascular: Normal rate, regular rhythm, normal heart sounds and intact distal pulses.   Pulmonary/Chest: Effort normal and breath sounds normal. No respiratory distress. She has no wheezes. She has no rales. She exhibits tenderness (patient has reproducible pain in the right posterior thorax at approximately the levels of 10, 11 and 12 ribs).  There is no rash on the chest wall.  Abdominal: Soft. She exhibits no distension. There is no  tenderness. There is no guarding.  Musculoskeletal: Normal range of motion. She exhibits no edema or tenderness.  Neurological: She is alert and oriented to person, place, and time. No cranial nerve deficit. Coordination normal.  Normal gait. It's antalgic but no motor weakness.  Skin: Skin is warm and dry.  Psychiatric: She has a normal mood and affect.    ED Course  Procedures (including critical care time) Labs Review  Labs Reviewed  URINALYSIS, ROUTINE W REFLEX MICROSCOPIC - Abnormal; Notable for the following:    Glucose, UA >1000 (*)    All other components within normal limits  URINE MICROSCOPIC-ADD ON - Abnormal; Notable for the following:    Squamous Epithelial / LPF FEW (*)    All other components within normal limits    Imaging Review Dg Chest 2 View  09/07/2014   CLINICAL DATA:  Two weeks of progressive back pain to the right of midline ; history of diabetes and hypertension and morbid obesity.  EXAM: CHEST  2 VIEW  COMPARISON:  PA and lateral chest of June 29, 2013  FINDINGS: The lungs are well-expanded and clear. The heart and pulmonary vascularity are normal. There is no pleural effusion or pneumothorax. The bony thorax exhibits no acute abnormalities. There are mild degenerative disc changes in the mid and lower thoracic spine.  IMPRESSION: There is no active cardiopulmonary disease. The observed portions of the bony thorax exhibit no acute abnormalities.   Electronically Signed   By: David  Martinique   On: 09/07/2014 15:11     EKG Interpretation None      MDM   Final diagnoses:  Right-sided thoracic back pain   The patient has pain that seems musculoskeletal. It's very reproducible by twisting and bending motions. There are no associated cardiac or respiratory symptoms on review of systems. At this point the patient will be treated for pain control with a planned follow-up for her family physician she is given instructions for which to return. Other consideration  is given to possible early onset of shingles without evident rash. Patient has been counseled on watching for rash in seeking treatment if it should develop.    Charlesetta Shanks, MD 09/07/14 [phone]

## 2014-09-23 IMAGING — CR DG LUMBAR SPINE 2-3V
3 series · 3 of 3 positions shown · non-contrast
Comparison: None.

CLINICAL DATA: Low back pain.  No known injury.

LUMBAR SPINE - 2-3 VIEW

[t lumbar spine ap]
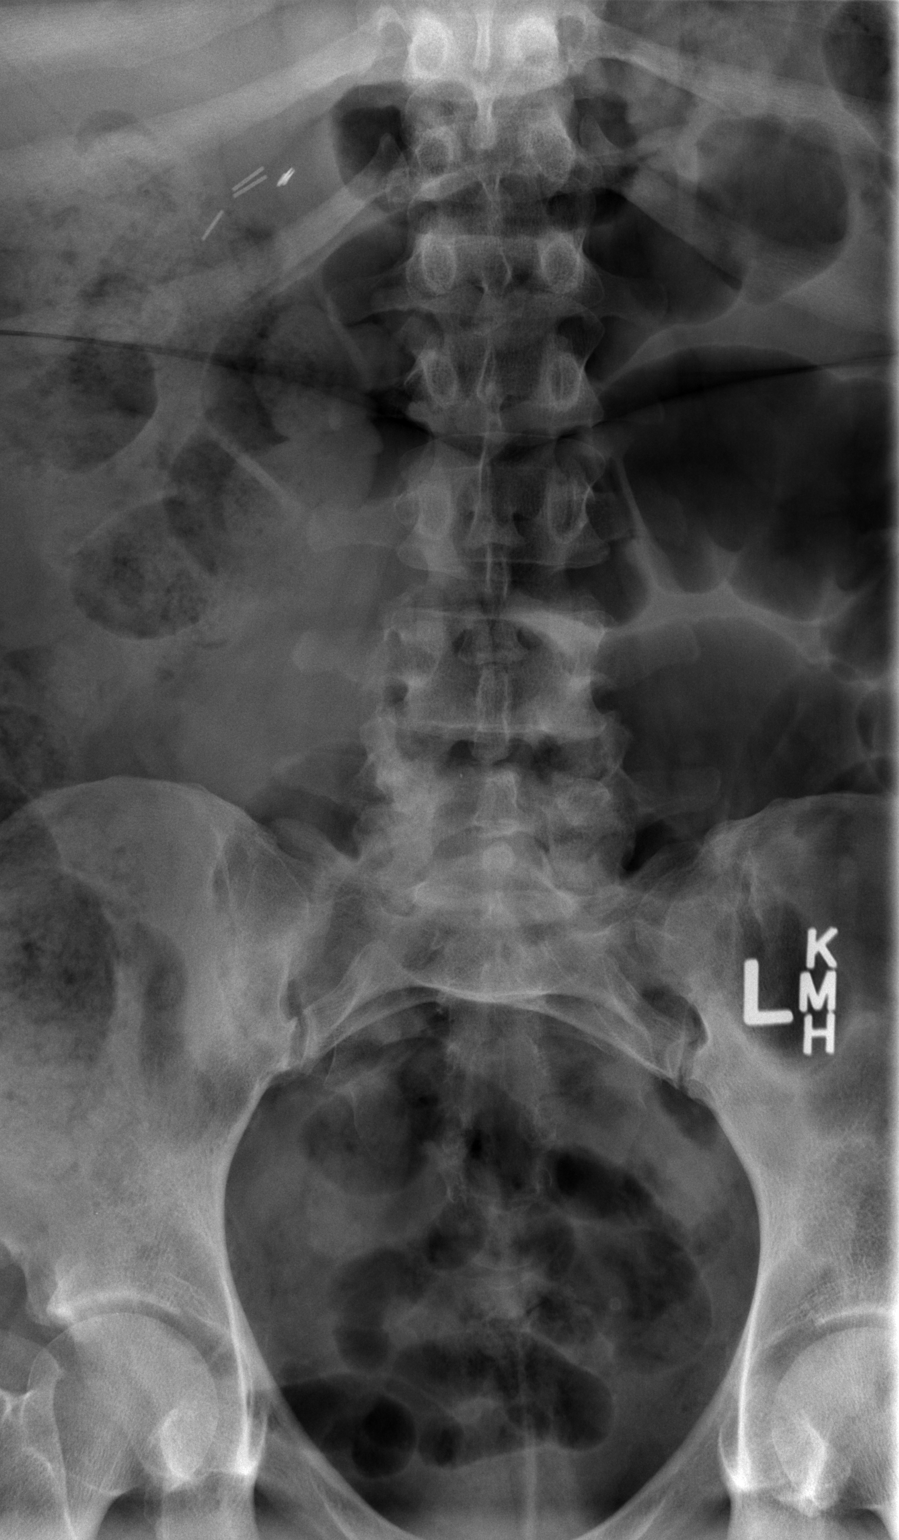

[t lumbar spine lat]
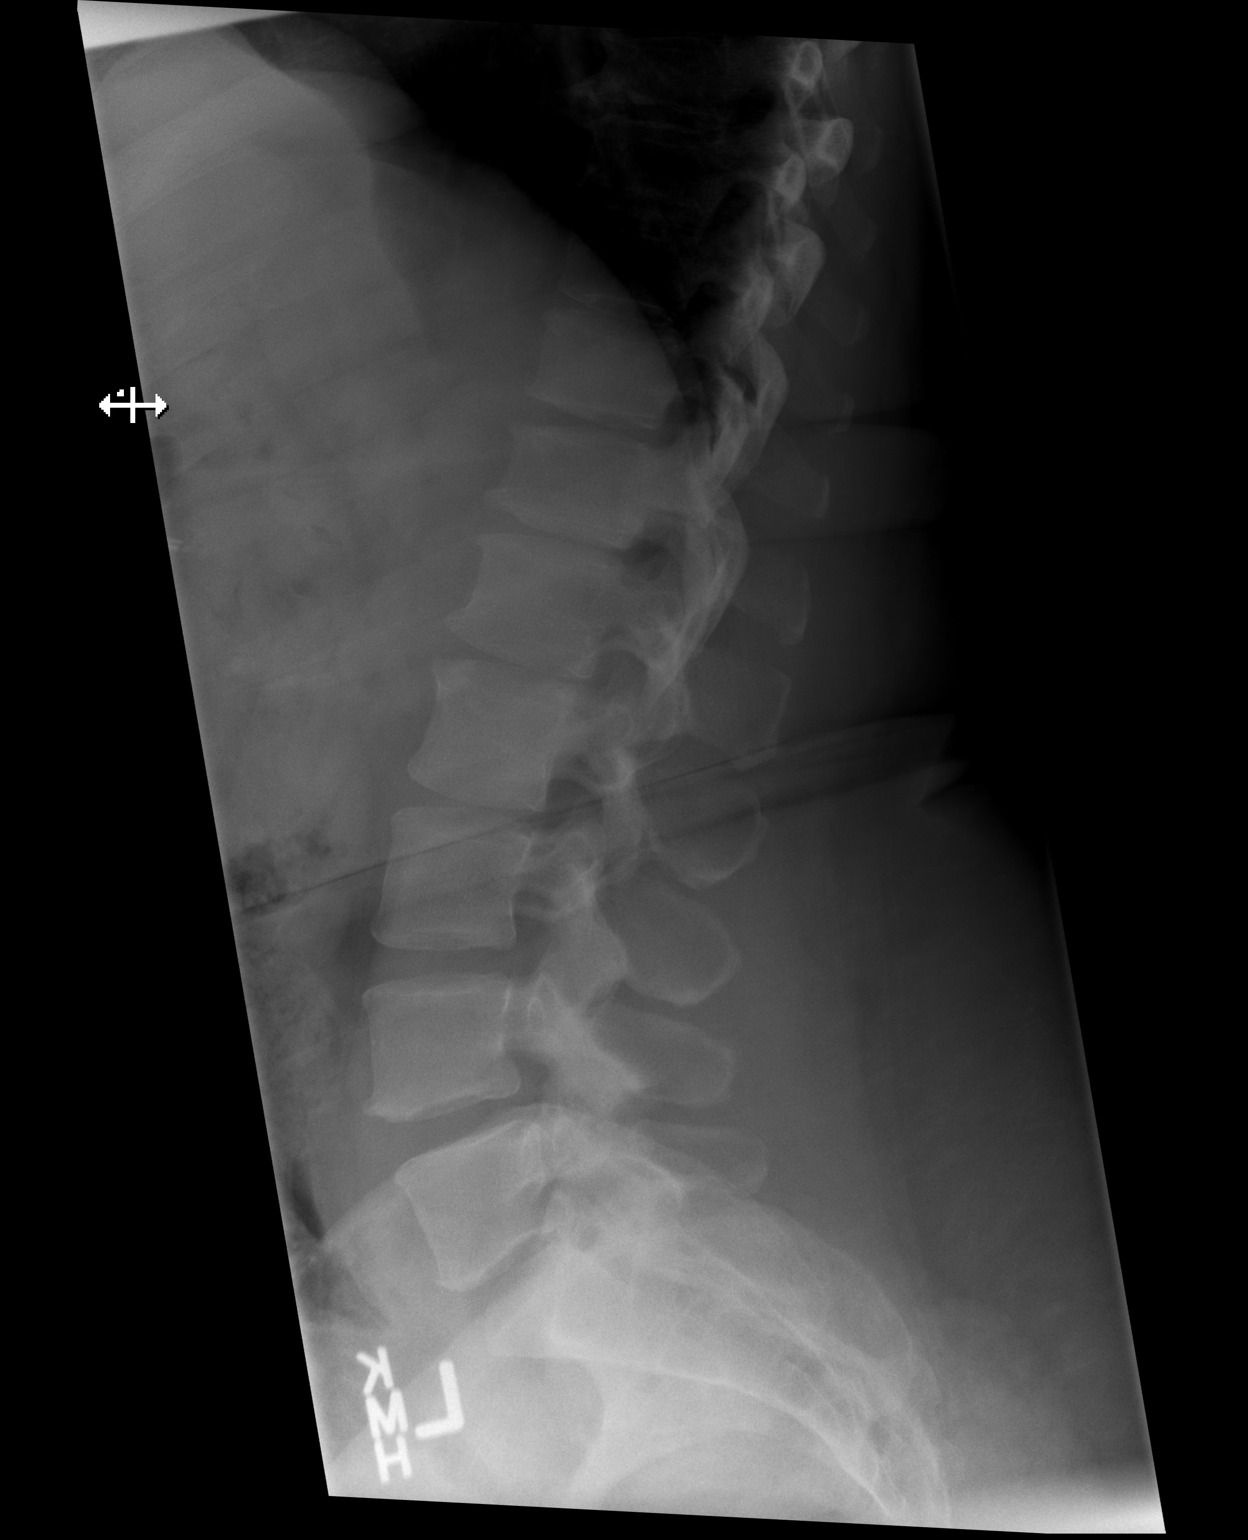

[t lumbar l-5 s-1 spot]
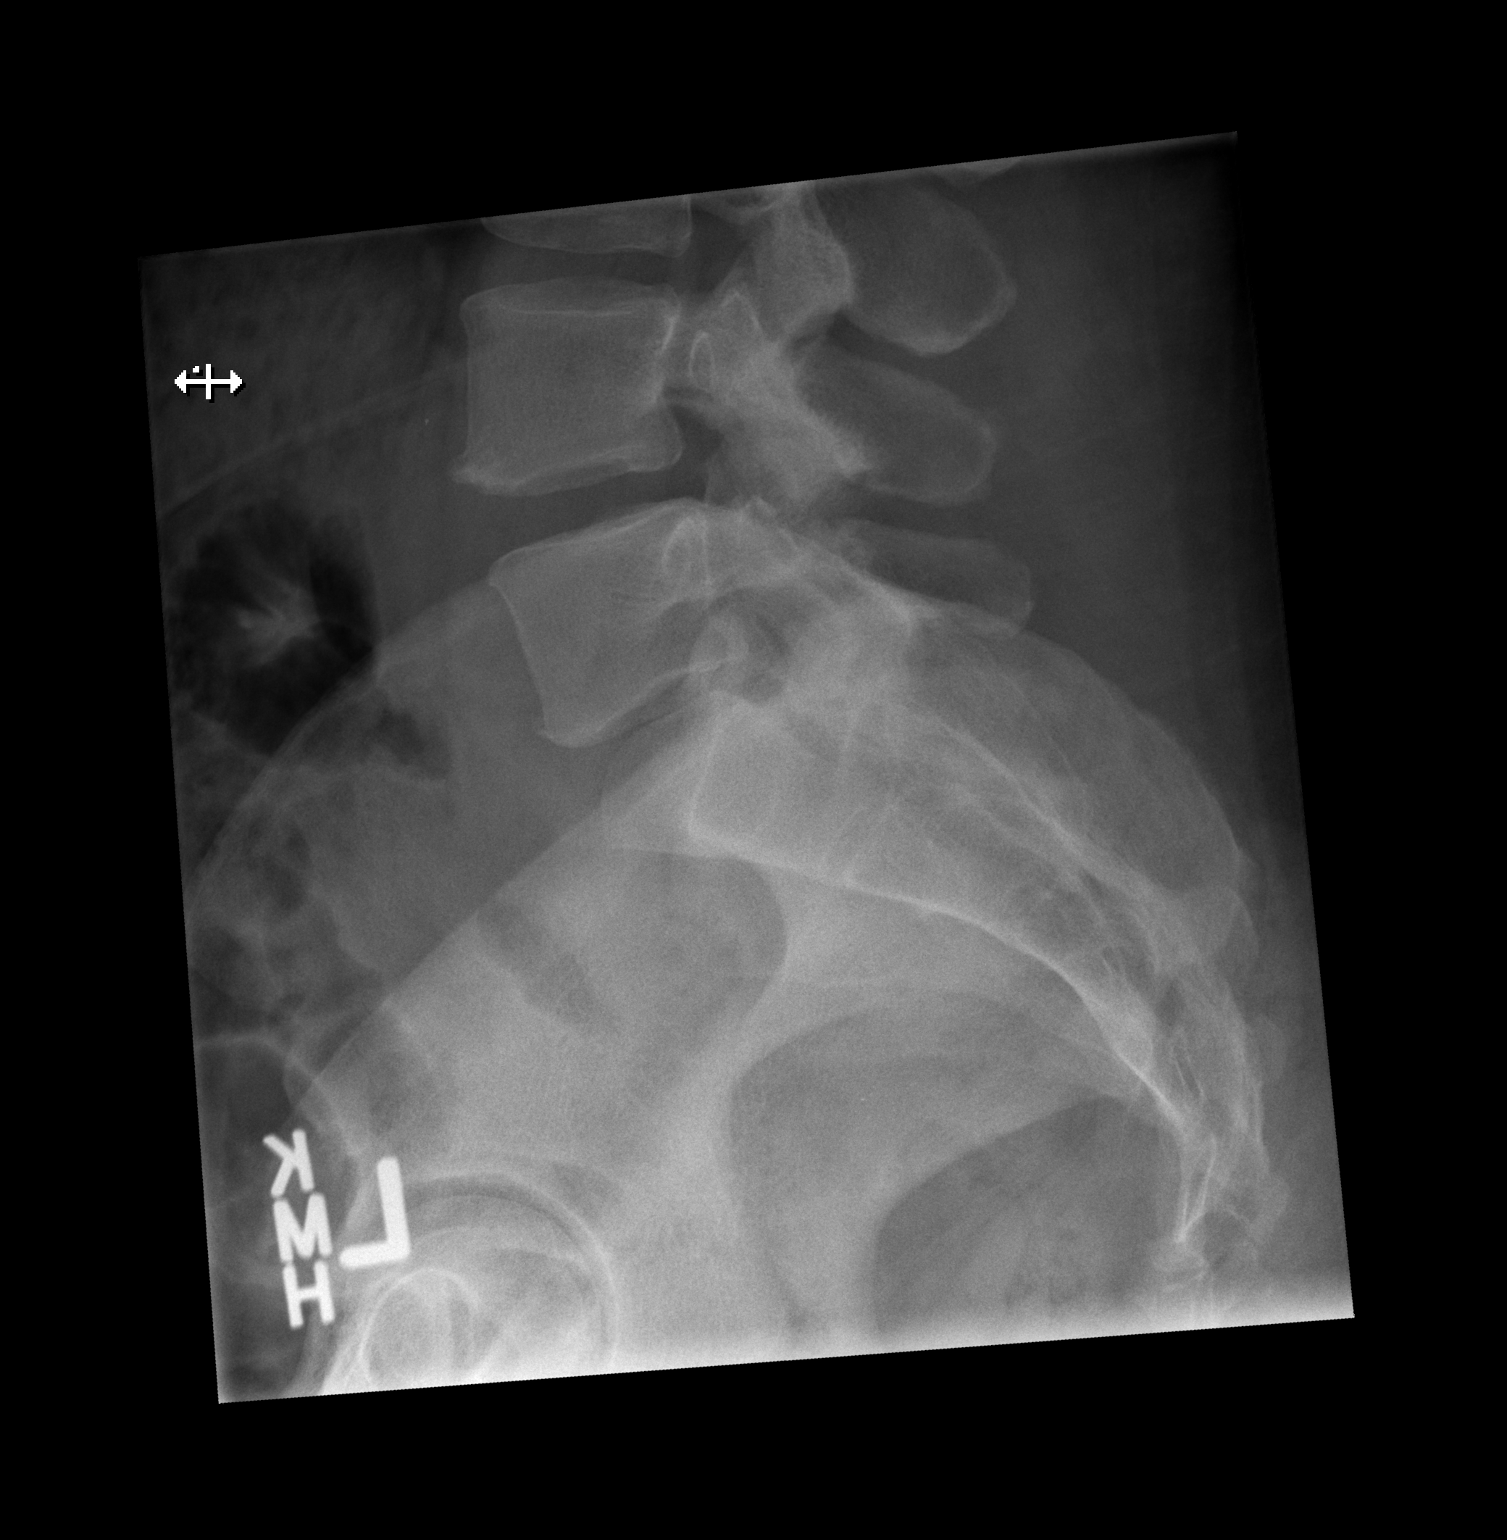

[3 of 3 positions shown; findings below may reference images not displayed]

FINDINGS: No evidence of lumbar spine fracture or subluxation.
Mild degenerative disc disease is seen at levels of T12-L1, L1-2
and L4-5.  No other significant bone abnormality identified.
IMPRESSION: 1.  No acute findings.
2.  Mild degenerative disc disease.

## 2014-09-26 ENCOUNTER — Emergency Department (HOSPITAL_COMMUNITY)
Admission: EM | Admit: 2014-09-26 | Discharge: 2014-09-26 | Disposition: A | Discharge status: Home / Self Care | Payer: Medicaid Other | Attending: Emergency Medicine | Admitting: Emergency Medicine

## 2014-09-26 ENCOUNTER — Emergency Department (HOSPITAL_COMMUNITY): Payer: Medicaid Other

## 2014-09-26 ENCOUNTER — Encounter (HOSPITAL_COMMUNITY): Payer: Self-pay | Admitting: Emergency Medicine

## 2014-09-26 DIAGNOSIS — Z8742 Personal history of other diseases of the female genital tract: Secondary | ICD-10-CM | POA: Insufficient documentation

## 2014-09-26 DIAGNOSIS — R81 Glycosuria: Secondary | ICD-10-CM

## 2014-09-26 DIAGNOSIS — K219 Gastro-esophageal reflux disease without esophagitis: Secondary | ICD-10-CM | POA: Insufficient documentation

## 2014-09-26 DIAGNOSIS — Z79899 Other long term (current) drug therapy: Secondary | ICD-10-CM | POA: Insufficient documentation

## 2014-09-26 DIAGNOSIS — M109 Gout, unspecified: Secondary | ICD-10-CM | POA: Diagnosis not present

## 2014-09-26 DIAGNOSIS — Z791 Long term (current) use of non-steroidal anti-inflammatories (NSAID): Secondary | ICD-10-CM | POA: Diagnosis not present

## 2014-09-26 DIAGNOSIS — Z7982 Long term (current) use of aspirin: Secondary | ICD-10-CM | POA: Diagnosis not present

## 2014-09-26 DIAGNOSIS — I10 Essential (primary) hypertension: Secondary | ICD-10-CM | POA: Insufficient documentation

## 2014-09-26 DIAGNOSIS — F419 Anxiety disorder, unspecified: Secondary | ICD-10-CM | POA: Insufficient documentation

## 2014-09-26 DIAGNOSIS — Z87891 Personal history of nicotine dependence: Secondary | ICD-10-CM | POA: Insufficient documentation

## 2014-09-26 DIAGNOSIS — Z862 Personal history of diseases of the blood and blood-forming organs and certain disorders involving the immune mechanism: Secondary | ICD-10-CM | POA: Insufficient documentation

## 2014-09-26 DIAGNOSIS — E780 Pure hypercholesterolemia: Secondary | ICD-10-CM | POA: Diagnosis not present

## 2014-09-26 DIAGNOSIS — M546 Pain in thoracic spine: Secondary | ICD-10-CM | POA: Diagnosis present

## 2014-09-26 DIAGNOSIS — M1990 Unspecified osteoarthritis, unspecified site: Secondary | ICD-10-CM | POA: Diagnosis not present

## 2014-09-26 DIAGNOSIS — M5134 Other intervertebral disc degeneration, thoracic region: Secondary | ICD-10-CM | POA: Diagnosis not present

## 2014-09-26 DIAGNOSIS — E785 Hyperlipidemia, unspecified: Secondary | ICD-10-CM | POA: Insufficient documentation

## 2014-09-26 DIAGNOSIS — M47814 Spondylosis without myelopathy or radiculopathy, thoracic region: Secondary | ICD-10-CM

## 2014-09-26 DIAGNOSIS — E119 Type 2 diabetes mellitus without complications: Secondary | ICD-10-CM | POA: Insufficient documentation

## 2014-09-26 DIAGNOSIS — Z86718 Personal history of other venous thrombosis and embolism: Secondary | ICD-10-CM | POA: Insufficient documentation

## 2014-09-26 LAB — URINALYSIS, ROUTINE W REFLEX MICROSCOPIC
BILIRUBIN URINE: NEGATIVE
Glucose, UA: 500 mg/dL — AB
HGB URINE DIPSTICK: NEGATIVE
KETONES UR: NEGATIVE mg/dL
Leukocytes, UA: NEGATIVE
NITRITE: NEGATIVE
PH: 5 (ref 5.0–8.0)
Protein, ur: NEGATIVE mg/dL
Specific Gravity, Urine: 1.016 (ref 1.005–1.030)
Urobilinogen, UA: 0.2 mg/dL (ref 0.0–1.0)

## 2014-09-26 MED ORDER — IOHEXOL 350 MG/ML SOLN
100.0000 mL | Freq: Once | INTRAVENOUS | Status: AC | PRN
  Administered 2014-09-26: 100 mL via INTRAVENOUS

## 2014-09-26 NOTE — ED Notes (Signed)
Pt walked to BR for urine sample.

## 2014-09-26 NOTE — ED Notes (Signed)
Pt states that she has been having rt sided, low back pain.  States that she came here 11/27 after pain had been constant for a week and a half and then was referred to her PCP.  PCP gave her gabapentin for possible shingles.  States that she has been taking this medication for 2 wks with little relief.  No rash noted to area of pain.  Pt describes pain as a "knife".  States it is much worse with movement.  Denies injury.

## 2014-09-26 NOTE — ED Provider Notes (Signed)
CSN: [id number]     Arrival date & time 09/26/14  3762 History   First MD Initiated Contact with Patient 09/26/14 0825     Chief Complaint  Patient presents with  . Back Pain     (Consider location/radiation/quality/duration/timing/severity/associated sxs/prior Treatment) HPI Comments: [name] is a 53 y.o. female with a PMHx of HTN, HLD, GERD, remote hx of DVT (occurred in 2000, no longer on coumadin), DM2, morbid obesity, arthritis, back pain, and gout, who presents to the ED with complaints of ongoing persistent R sided thoracic back pain. Pt was seen on 09/07/14, and a CXR showed mild degenerative changes in thoracic spine. She was diagnosed with ?shingles, and followed up with her PCP who placed her on gabapentin 300mg  BID which has not helped with her pain. Pain is 9/10 sharp "knife-like", constant, located over R sided thoracic back radiating to the lateral torso but not crossing anteriorly, worse with twisting and movement, and unrelieved with norco 5-325mg  and gabapentin. She has not tried anything else for this pain. Denies fevers, chills, HA, vision changes, lightheadedness, dizziness, CP, SOB, abd pain, N/V/D/C, hematochezia, melena, hematuria, dysuria, vaginal symptoms, myalgias, arthralgias, weakness, LE swelling paresthesias, numbness, or rashes. Denies IVDU or hx of cancer. PCP Dr. York Ram, who she states wanted to get a CT chest/abd "to look for shingles". Of note, pt did not take her antiHTN medication today. She only took her GERD medications. When asked about her DVT, she states it occurred in the yr 2000, no known cause, found incidentally after an unrelated injury prompted her to go to the ED for leg pain (did a split accidentally, pulled groin muscles) and the DVTs were found at that time. Was on coumadin initially but no longer taking this, no DVTs since. PSHx includes cholecystectomy and hysterectomy.   Patient is a 53 y.o. female presenting with back pain.  The history is provided by the patient. No language interpreter was used.  Back Pain Location:  Thoracic spine Quality: sharp, knife-like. Radiates to: R lateral torso. Pain severity:  Moderate Pain is:  Same all the time Onset quality:  Gradual Duration:  3 weeks Timing:  Constant Progression:  Unchanged Chronicity:  New Context: not recent injury and not twisting   Relieved by:  Nothing Worsened by:  Twisting and movement Ineffective treatments:  Narcotics (norco and gabapentin) Associated symptoms: no abdominal pain, no bladder incontinence, no bowel incontinence, no chest pain, no dysuria, no fever, no headaches, no leg pain, no numbness, no paresthesias, no perianal numbness, no tingling, no weakness and no weight loss   Risk factors: obesity   Risk factors: no hx of cancer     Past Medical History  Diagnosis Date  . Hypertension 4 months  . Hypercholesteremia   . GERD (gastroesophageal reflux disease)   . DVT (deep venous thrombosis)     remote - took coumadin  . Prediabetes   . Morbid obesity   . Anxiety   . Substance abuse   . Fibroid   . Menometrorrhagia   . Dysmenorrhea   . Dysrhythmia     irregular heart beat  . Diabetes mellitus without complication     recent dx   . Arthritis   . Anemia   . Gout    Past Surgical History  Procedure Laterality Date  . Rotator cuff repair Bilateral 2009  . Abdominal hysterectomy  2008  . Ectopic pregnancy surgery  1980's  . Cholecystectomy    . Tonsillectomy    .  Tubal ligation    . Tooth extraction N/A 07/03/2013    Procedure: DENTAL EXTRACTION # 20;  Surgeon: Gae Bon, DDS;  Location: [facility];  Service: Oral Surgery;  Laterality: N/A;  . Mandible osteotomy Bilateral 07/03/2013    Procedure: BILATERAL TORI;  Surgeon: Gae Bon, DDS;  Location: [facility];  Service: Oral Surgery;  Laterality: Bilateral;  . Transthoracic echocardiogram  07/2012    EF=>55%, mod conc LVH; LA mod dilated; trace MR; mild TR with normal  RVSP; trace AV regurg  . Nm myocar perf wall motion  07/2012    lexiscan - normal pattern of perfusion, low risk  . Sleep study  07/27/2012    AHI 4.8/hr   Family History  Problem Relation Age of Onset  . Heart disease      No family history  . Diabetes Mother   . Hypertension Mother   . Diabetes Father   . Hypertension Father   . Cancer Father 75  . Cancer Brother 14   History  Substance Use Topics  . Smoking status: Former Smoker    Quit date: 02/14/2011  . Smokeless tobacco: Never Used  . Alcohol Use: No     Comment: Previous ETOH abuse   OB History    Gravida Para Term Preterm AB TAB SAB Ectopic Multiple Living   8 6   1   1        Review of Systems  Constitutional: Negative for fever, chills, weight loss and diaphoresis.  Eyes: Negative for visual disturbance.  Respiratory: Negative for cough and shortness of breath.   Cardiovascular: Negative for chest pain, palpitations and leg swelling.  Gastrointestinal: Negative for nausea, vomiting, abdominal pain, diarrhea, constipation, blood in stool and bowel incontinence.  Genitourinary: Negative for bladder incontinence, dysuria, urgency, hematuria, vaginal bleeding and vaginal discharge.  Musculoskeletal: Positive for back pain. Negative for myalgias, arthralgias, gait problem and neck pain.  Skin: Negative for color change and rash.  Neurological: Negative for dizziness, tingling, syncope, weakness, light-headedness, numbness, headaches and paresthesias.  Psychiatric/Behavioral: Negative for confusion.   10 Systems reviewed and are negative for acute change except as noted in the HPI.    Allergies  Buprenorphine hcl-naloxone hcl; Naloxone; and Dilaudid  Home Medications   Prior to Admission medications   Medication Sig Start Date End Date Taking? Authorizing Provider  allopurinol (ZYLOPRIM) 100 MG tablet Take 100 mg by mouth daily.    Historical Provider, MD  aspirin 81 MG chewable tablet Chew 81 mg by mouth  daily.    Historical Provider, MD  atorvastatin (LIPITOR) 40 MG tablet Take 40 mg by mouth daily.    Historical Provider, MD  busPIRone (BUSPAR) 7.5 MG tablet Take 7.5 mg by mouth 2 (two) times daily.     Historical Provider, MD  cyanocobalamin 500 MCG tablet Take 500 mcg by mouth daily.    Historical Provider, MD  cyclobenzaprine (FLEXERIL) 10 MG tablet Take 10 mg by mouth 2 (two) times daily as needed for muscle spasms (muscle spasms). Muscle spasms    Historical Provider, MD  diclofenac sodium (VOLTAREN) 1 % GEL Apply 2 g topically 2 (two) times daily.    Historical Provider, MD  furosemide (LASIX) 20 MG tablet Take 20 mg by mouth daily.    Historical Provider, MD  HYDROcodone-acetaminophen (NORCO/VICODIN) 5-325 MG per tablet Take 1-2 tablets by mouth every 4 (four) hours as needed for moderate pain or severe pain. 09/07/14   Charlesetta Shanks, MD  INVOKANA 100 MG  TABS tablet Take 100 mg by mouth 2 (two) times daily. 08/20/14   Historical Provider, MD  loratadine (CLARITIN) 10 MG tablet Take 10 mg by mouth daily as needed for allergies (allergies).     Historical Provider, MD  metFORMIN (GLUCOPHAGE) 500 MG tablet Take 500 mg by mouth 2 (two) times daily with a meal.    Historical Provider, MD  metoprolol succinate (TOPROL-XL) 25 MG 24 hr tablet Take 25 mg by mouth daily. 03/10/13   Rebecca Eaton, MD  naproxen (NAPROSYN) 500 MG tablet Take 1 tablet (500 mg total) by mouth 2 (two) times daily. 09/07/14   Charlesetta Shanks, MD  Omega-3 Fatty Acids (FISH OIL) 1000 MG CAPS Take 1 capsule by mouth daily.    Historical Provider, MD  omeprazole (PRILOSEC) 40 MG capsule Take 40 mg by mouth daily.    Historical Provider, MD  potassium chloride SA (K-DUR,KLOR-CON) 20 MEQ tablet Take 20 mEq by mouth daily.     Historical Provider, MD  traMADol (ULTRAM) 50 MG tablet Take 100 mg by mouth 3 (three) times daily as needed for moderate pain or severe pain (infllammation in hips). pain    Historical Provider, MD    BP 146/105 mmHg  Pulse 83  Temp(Src) 97.7 F (36.5 C) (Oral)  Resp 20  SpO2 100% Physical Exam  Constitutional: She is oriented to person, place, and time. Vital signs are normal. She appears well-developed and well-nourished.  Non-toxic appearance. No distress.  Morbidly obese AAF Mild HTN noted, baseline for pt, otherwise afebrile with VSS  HENT:  Head: Normocephalic and atraumatic.  Mouth/Throat: Oropharynx is clear and moist and mucous membranes are normal.  Eyes: Conjunctivae and EOM are normal. Right eye exhibits no discharge. Left eye exhibits no discharge.  Neck: Normal range of motion. Neck supple. No spinous process tenderness and no muscular tenderness present. No rigidity. Normal range of motion present.  FROM intact without spinous process or paraspinous muscle TTP, no bony stepoffs or deformities, no muscle spasms. No rigidity or meningeal signs. No bruising or swelling.   Cardiovascular: Normal rate, regular rhythm, normal heart sounds and intact distal pulses.  Exam reveals no gallop and no friction rub.   No murmur heard. Pulmonary/Chest: Effort normal and breath sounds normal. No respiratory distress. She has no decreased breath sounds. She has no wheezes. She has no rhonchi. She has no rales.   She exhibits tenderness. She exhibits no crepitus and no deformity.  CTAB in all lung fields Mild R sided thoracic chest wall TTP along 10th-11th ribs, no rashes or crepitus, no deformity  Abdominal: Soft. Normal appearance and bowel sounds are normal. She exhibits no distension. There is no tenderness. There is no rigidity, no rebound, no guarding, no CVA tenderness, no tenderness at McBurney's point and negative Murphy's sign.  Soft, obese abd which mildly limits exam, ND, +BS throughout, mild epigastric TTP with no r/g/r, neg murphy's, neg mcburney's, no CVA TTP   Musculoskeletal: Normal range of motion.       Thoracic back: She exhibits tenderness. She exhibits no swelling  and no deformity.       Back:  MAE x4 Strength 5/5 in all extremities Sensation grossly intact in all extremities Gait steady FROM intact in thoracic spine, no midline bony TTP or crepitus, no deformity or step offs. Mild R sided thoracic chest wall/paraspinous muscle TTP, no palpable spasm but body habitus limits exam. No rashes.  Neurological: She is alert and oriented to person, place, and time.  She has normal strength. No sensory deficit. Gait normal.  Sensation and strength intact Gait nonataxic  Skin: Skin is warm, dry and intact. No rash noted.  No rashes  Psychiatric: She has a normal mood and affect.  Nursing note and vitals reviewed.   ED Course  Procedures (including critical care time) Labs Review Labs Reviewed  URINALYSIS, ROUTINE W REFLEX MICROSCOPIC - Abnormal; Notable for the following:    Glucose, UA 500 (*)    All other components within normal limits    Imaging Review Ct Angio Chest Pe W/cm &/or Wo Cm  09/26/2014   CLINICAL DATA:  Right-sided back pain for 1 month. History of DVT. Question pulmonary embolus.  EXAM: CT ANGIOGRAPHY CHEST WITH CONTRAST  TECHNIQUE: Multidetector CT imaging of the chest was performed using the standard protocol during bolus administration of intravenous contrast. Multiplanar CT image reconstructions and MIPs were obtained to evaluate the vascular anatomy.  CONTRAST:  138mL OMNIPAQUE IOHEXOL 350 MG/ML SOLN  COMPARISON:  06/13/2012.  FINDINGS: Negative for pulmonary embolus. No pathologically enlarged mediastinal, hilar or axillary lymph nodes. Heart is at the upper limits of normal in size. No pericardial effusion.  Probable mild subpleural scarring in the right middle lobe and right lower lobe. Lungs are otherwise clear. No pleural fluid. Airway is unremarkable.  Incidental imaging of the upper abdomen shows the visualized portions of the liver, spleen and stomach to be grossly unremarkable.  No worrisome lytic or sclerotic lesions.  Degenerative changes are seen in the spine.  Review of the MIP images confirms the above findings.  IMPRESSION: Negative for pulmonary embolus. No findings to explain the patient's given symptoms.   Electronically Signed   By: Lorin Picket M.D.   On: 09/26/2014 10:42     EKG Interpretation None      MDM   Final diagnoses:  Right-sided thoracic back pain  Degenerative joint disease of thoracic spine  Glucosuria  HTN (hypertension), benign    53 y.o. female with 3wks of ongoing R sided thoracic back pain. Tenderness on exam, no visible rashes. Pt wants CT chest/abd. Given that she has a prior hx of DVT, will obtain CT Chest to r/o PE. Will obtain U/A although on 09/07/14 it was neg. Doubt urinary source for pain. Pt s/p chole, doubt this is GB related. HTN without symptoms, doubt need for labs. Will reassess shortly. Pt declines pain meds, states she has tried her home narcotics and doesn't want anything today.  11:00 AM U/A showing mild glucosuria but otherwise WNL. CT revealing small probable subpleural scarring in RML/RLL but no PE or acute findings to explain her pain. Does show mild degenerative findings in thoracic spine. This could still be the source of her pain, or could be related to possible shingles. Pt states she doesn't need scripts for pain meds, she has some at home. Will have her f/up with PCP for ongoing management of her pain. I explained the diagnosis and have given explicit precautions to return to the ER including for any other new or worsening symptoms. The patient understands and accepts the medical plan as it's been dictated and I have answered their questions. Discharge instructions concerning home care and prescriptions have been given. The patient is STABLE and is discharged to home in good condition.  BP 146/105 mmHg  Pulse 83  Temp(Src) 97.7 F (36.5 C) (Oral)  Resp 20  SpO2 100%   Patty Sermons Camprubi-Soms, PA-C 09/26/14 [place]. Alvino Chapel,  MD 09/27/14 [phone]

## 2014-09-26 NOTE — Discharge Instructions (Signed)
Your labs today did not show any concerning emergent findings that could explain your pain. Continue to use your home pain medications, and see your regular doctor for ongoing evaluation and management. Return to the ER for changes or worsening of symptoms. Take your blood pressure medications regularly and monitor your blood pressure.    Back Pain, Adult Back pain is very common. The pain often gets better over time. The cause of back pain is usually not dangerous. Most people can learn to manage their back pain on their own.  HOME CARE   Stay active. Start with short walks on flat ground if you can. Try to walk farther each day.  Do not sit, drive, or stand in one place for more than 30 minutes. Do not stay in bed.  Do not avoid exercise or work. Activity can help your back heal faster.  Be careful when you bend or lift an object. Bend at your knees, keep the object close to you, and do not twist.  Sleep on a firm mattress. Lie on your side, and bend your knees. If you lie on your back, put a pillow under your knees.  Only take medicines as told by your doctor.  Put ice on the injured area.  Put ice in a plastic bag.  Place a towel between your skin and the bag.  Leave the ice on for 15-20 minutes, 03-04 times a day for the first 2 to 3 days. After that, you can switch between ice and heat packs.  Ask your doctor about back exercises or massage.  Avoid feeling anxious or stressed. Find good ways to deal with stress, such as exercise. GET HELP RIGHT AWAY IF:   Your pain does not go away with rest or medicine.  Your pain does not go away in 1 week.  You have new problems.  You do not feel well.  The pain spreads into your legs.  You cannot control when you poop (bowel movement) or pee (urinate).  Your arms or legs feel weak or lose feeling (numbness).  You feel sick to your stomach (nauseous) or throw up (vomit).  You have belly (abdominal) pain.  You feel like you  may pass out (faint). MAKE SURE YOU:   Understand these instructions.  Will watch your condition.  Will get help right away if you are not doing well or get worse. Document Released: 03/16/2008 Document Revised: 12/21/2011 Document Reviewed: 01/30/2014 [facility] Patient Information 2015 [place], [place]. This information is not intended to replace advice given to you by your health care provider. Make sure you discuss any questions you have with your health care provider.  Hypertension Hypertension is another name for high blood pressure. High blood pressure forces your heart to work harder to pump blood. A blood pressure reading has two numbers, which includes a higher number over a lower number (example: 110/72). HOME CARE   Have your blood pressure rechecked by your doctor.  Only take medicine as told by your doctor. Follow the directions carefully. The medicine does not work as well if you skip doses. Skipping doses also puts you at risk for problems.  Do not smoke.  Monitor your blood pressure at home as told by your doctor. GET HELP IF:  You think you are having a reaction to the medicine you are taking.  You have repeat headaches or feel dizzy.  You have puffiness (swelling) in your ankles.  You have trouble with your vision. GET HELP RIGHT AWAY IF:  You get a very bad headache and are confused.  You feel weak, numb, or faint.  You get chest or belly (abdominal) pain.  You throw up (vomit).  You cannot breathe very well. MAKE SURE YOU:   Understand these instructions.  Will watch your condition.  Will get help right away if you are not doing well or get worse. Document Released: 03/16/2008 Document Revised: 10/03/2013 Document Reviewed: 07/21/2013 [facility] Patient Information 2015 [place], [place]. This information is not intended to replace advice given to you by your health care provider. Make sure you discuss any questions you have with your health care  provider.  How to Take Your Blood Pressure HOW DO I GET A BLOOD PRESSURE MACHINE?  You can buy an electronic home blood pressure machine at your local pharmacy. Insurance will sometimes cover the cost if you have a prescription.  Ask your doctor what type of machine is best for you. There are different machines for your arm and your wrist.  If you decide to buy a machine to check your blood pressure on your arm, first check the size of your arm so you can buy the right size cuff. To check the size of your arm:   Use a measuring tape that shows both inches and centimeters.   Wrap the measuring tape around the upper-middle part of your arm. You may need someone to help you measure.   Write down your arm measurement in both inches and centimeters.   To measure your blood pressure correctly, it is important to have the right size cuff.   If your arm is up to 13 inches (up to 34 centimeters), get an adult cuff size.  If your arm is 13 to 17 inches (35 to 44 centimeters), get a large adult cuff size.    If your arm is 17 to 20 inches (45 to 52 centimeters), get an adult thigh cuff.  WHAT DO THE NUMBERS MEAN?   There are two numbers that make up your blood pressure. For example: 120/80.  The first number (120 in our example) is called the "systolic pressure." It is a measure of the pressure in your blood vessels when your heart is pumping blood.  The second number (80 in our example) is called the "diastolic pressure." It is a measure of the pressure in your blood vessels when your heart is resting between beats.  Your doctor will tell you what your blood pressure should be. WHAT SHOULD I DO BEFORE I CHECK MY BLOOD PRESSURE?   Try to rest or relax for at least 30 minutes before you check your blood pressure.  Do not smoke.  Do not have any drinks with caffeine, such as:  Soda.  Coffee.  Tea.  Check your blood pressure in a quiet room.  Sit down and stretch out your  arm on a table. Keep your arm at about the level of your heart. Let your arm relax.  Make sure that your legs are not crossed. HOW DO I CHECK MY BLOOD PRESSURE?  Follow the directions that came with your machine.  Make sure you remove any tight-fighting clothing from your arm or wrist. Wrap the cuff around your upper arm or wrist. You should be able to fit a finger between the cuff and your arm. If you cannot fit a finger between the cuff and your arm, it is too tight and should be removed and rewrapped.  Some units require you to manually pump up the arm cuff.  Automatic units inflate the cuff when you press a button.  Cuff deflation is automatic in both models.  After the cuff is inflated, the unit measures your blood pressure and pulse. The readings are shown on a monitor. Hold still and breathe normally while the cuff is inflated.  Getting a reading takes less than a minute.  Some models store readings in a memory. Some provide a printout of readings. If your machine does not store your readings, keep a written record.  Take readings with you to your next visit with your doctor. Document Released: 09/10/2008 Document Revised: 02/12/2014 Document Reviewed: 11/23/2013 [facility] Patient Information 2015 [place], [place]. This information is not intended to replace advice given to you by your health care provider. Make sure you discuss any questions you have with your health care provider.

## 2015-07-31 ENCOUNTER — Other Ambulatory Visit: Payer: Self-pay

## 2015-07-31 DIAGNOSIS — Z1231 Encounter for screening mammogram for malignant neoplasm of breast: Secondary | ICD-10-CM

## 2015-09-02 ENCOUNTER — Ambulatory Visit: Payer: Medicaid Other

## 2015-09-20 ENCOUNTER — Ambulatory Visit
Admission: RE | Admit: 2015-09-20 | Discharge: 2015-09-20 | Disposition: A | Discharge status: Home / Self Care | Payer: Medicaid Other | Source: Ambulatory Visit

## 2015-09-20 DIAGNOSIS — Z1231 Encounter for screening mammogram for malignant neoplasm of breast: Secondary | ICD-10-CM

## 2016-07-24 ENCOUNTER — Other Ambulatory Visit: Payer: Self-pay | Admitting: Specialist

## 2016-07-24 DIAGNOSIS — Z1231 Encounter for screening mammogram for malignant neoplasm of breast: Secondary | ICD-10-CM

## 2016-09-01 ENCOUNTER — Ambulatory Visit (INDEPENDENT_AMBULATORY_CARE_PROVIDER_SITE_OTHER): Payer: Medicaid Other | Admitting: Obstetrics & Gynecology

## 2016-09-01 ENCOUNTER — Encounter: Payer: Self-pay | Admitting: Obstetrics & Gynecology

## 2016-09-01 VITALS — BP 111/75 | HR 82 | Temp 97.0°F | Ht 66.0 in | Wt 316.0 lb

## 2016-09-01 DIAGNOSIS — Z90710 Acquired absence of both cervix and uterus: Secondary | ICD-10-CM

## 2016-09-01 DIAGNOSIS — N938 Other specified abnormal uterine and vaginal bleeding: Secondary | ICD-10-CM

## 2016-09-01 NOTE — Progress Notes (Signed)
Patient is in the office for annual, states that she has been having pink spotting for about 2 weeks.

## 2016-09-01 NOTE — Progress Notes (Signed)
Cc: was told to come for pap smear  55 y.o. G8P0010 No LMP recorded. Patient has had a hysterectomy. She was treated but her PCP for vaginal discharge and spotting 2 weeks ago and sx resolved. She used Flagyl, does not know what the diagnosis was. Breast exams and mammograms performed via her PCP. Pap tests are not indicated and she can continue to follow with her PCP. No exam done today. 20 min face to face and chart review, coordination of care  Woodroe Mode, MD 09/01/2016

## 2016-09-21 ENCOUNTER — Ambulatory Visit
Admission: RE | Admit: 2016-09-21 | Discharge: 2016-09-21 | Disposition: A | Discharge status: Home / Self Care | Payer: Medicaid Other | Source: Ambulatory Visit | Attending: Specialist | Admitting: Specialist

## 2016-09-21 DIAGNOSIS — Z1231 Encounter for screening mammogram for malignant neoplasm of breast: Secondary | ICD-10-CM

## 2016-12-01 ENCOUNTER — Telehealth (HOSPITAL_COMMUNITY): Payer: Self-pay | Admitting: Specialist

## 2016-12-01 ENCOUNTER — Other Ambulatory Visit: Payer: Self-pay | Admitting: Specialist

## 2016-12-01 DIAGNOSIS — R0789 Other chest pain: Secondary | ICD-10-CM

## 2016-12-01 DIAGNOSIS — A5901 Trichomonal vulvovaginitis: Secondary | ICD-10-CM

## 2016-12-01 NOTE — Telephone Encounter (Signed)
12/01/2016 09:10 AM Phone ([place]) Bartolo Darter, [name] (Self) [phone] (M)   Left Message - Called pt and lmsg for her to CB to get test scheduled.    By Verdene Rio

## 2016-12-09 ENCOUNTER — Ambulatory Visit (INDEPENDENT_AMBULATORY_CARE_PROVIDER_SITE_OTHER): Payer: Medicaid Other

## 2016-12-09 DIAGNOSIS — A5901 Trichomonal vulvovaginitis: Secondary | ICD-10-CM

## 2016-12-09 DIAGNOSIS — R0789 Other chest pain: Secondary | ICD-10-CM | POA: Diagnosis not present

## 2016-12-09 LAB — EXERCISE TOLERANCE TEST
CSEPED: 5 min
CSEPEDS: 0 s
CSEPEW: 7 METS
CSEPHR: 87 %
CSEPPHR: 144 {beats}/min
MPHR: 164 {beats}/min
RPE: 17
Rest HR: 68 {beats}/min

## 2017-02-08 ENCOUNTER — Other Ambulatory Visit (HOSPITAL_COMMUNITY): Payer: Self-pay | Admitting: Specialist

## 2017-02-08 DIAGNOSIS — E041 Nontoxic single thyroid nodule: Secondary | ICD-10-CM

## 2017-02-17 ENCOUNTER — Encounter (HOSPITAL_COMMUNITY)
Admission: RE | Admit: 2017-02-17 | Discharge: 2017-02-17 | Disposition: A | Discharge status: Home / Self Care | Payer: Medicaid Other | Source: Ambulatory Visit | Attending: Specialist | Admitting: Specialist

## 2017-02-17 DIAGNOSIS — E041 Nontoxic single thyroid nodule: Secondary | ICD-10-CM

## 2017-02-17 MED ORDER — SODIUM IODIDE I 131 CAPSULE
8.0000 | Freq: Once | INTRAVENOUS | Status: AC | PRN
  Administered 2017-02-17: 8 via ORAL

## 2017-02-18 ENCOUNTER — Encounter (HOSPITAL_COMMUNITY)
Admission: RE | Admit: 2017-02-18 | Discharge: 2017-02-18 | Disposition: A | Discharge status: Home / Self Care | Payer: Medicaid Other | Source: Ambulatory Visit | Attending: Specialist | Admitting: Specialist

## 2017-02-18 DIAGNOSIS — E041 Nontoxic single thyroid nodule: Secondary | ICD-10-CM | POA: Diagnosis present

## 2017-02-18 MED ORDER — SODIUM PERTECHNETATE TC 99M INJECTION
10.0000 | Freq: Once | INTRAVENOUS | Status: AC
  Administered 2017-02-18: 10 via INTRAVENOUS

## 2017-04-16 DIAGNOSIS — E041 Nontoxic single thyroid nodule: Secondary | ICD-10-CM | POA: Insufficient documentation

## 2017-07-22 ENCOUNTER — Ambulatory Visit (INDEPENDENT_AMBULATORY_CARE_PROVIDER_SITE_OTHER): Payer: Medicaid Other

## 2017-07-22 ENCOUNTER — Encounter (INDEPENDENT_AMBULATORY_CARE_PROVIDER_SITE_OTHER): Payer: Self-pay | Admitting: Orthopaedic Surgery

## 2017-07-22 ENCOUNTER — Ambulatory Visit (INDEPENDENT_AMBULATORY_CARE_PROVIDER_SITE_OTHER): Payer: Medicaid Other | Admitting: Orthopaedic Surgery

## 2017-07-22 ENCOUNTER — Ambulatory Visit (INDEPENDENT_AMBULATORY_CARE_PROVIDER_SITE_OTHER): Payer: Self-pay

## 2017-07-22 VITALS — BP 125/82 | HR 82 | Resp 14 | Ht 69.0 in | Wt 316.0 lb

## 2017-07-22 DIAGNOSIS — M79605 Pain in left leg: Secondary | ICD-10-CM

## 2017-07-22 DIAGNOSIS — M5442 Lumbago with sciatica, left side: Secondary | ICD-10-CM | POA: Diagnosis not present

## 2017-07-22 DIAGNOSIS — M25551 Pain in right hip: Secondary | ICD-10-CM

## 2017-07-22 DIAGNOSIS — M4807 Spinal stenosis, lumbosacral region: Secondary | ICD-10-CM

## 2017-07-22 DIAGNOSIS — M25552 Pain in left hip: Secondary | ICD-10-CM

## 2017-07-22 DIAGNOSIS — G8929 Other chronic pain: Secondary | ICD-10-CM | POA: Diagnosis not present

## 2017-07-22 DIAGNOSIS — M5136 Other intervertebral disc degeneration, lumbar region: Secondary | ICD-10-CM | POA: Diagnosis not present

## 2017-07-22 MED ORDER — HYDROCODONE-ACETAMINOPHEN 5-325 MG PO TABS: 1.0000 | ORAL_TABLET | Freq: Four times a day (QID) | ORAL | 0 refills | Status: DC | PRN

## 2017-07-22 NOTE — Progress Notes (Signed)
Office Visit Note   Patient: [name]           Date of Birth: [birth year]           MRN: [id number] Visit Date: 07/22/2017              Requested by: Javier Docker, MD [address] [place], [place] [address] PCP: Javier Docker, MD   Assessment & Plan: Visit Diagnoses:  1. Chronic left-sided low back pain with left-sided sciatica   2. Pain in left leg   3. Pain in right hip   4. Pain in left hip   5. Other intervertebral disc degeneration, lumbar region   6. Spinal stenosis of lumbosacral region     Plan:  #1: MRI scan of the lumbar spine to evaluate for stenosis versus HNP and foraminal stenosis. #2: She does take oxycodone from her medical doctor for her pain. #3: Call if she develops any bowel or bladder incontinence and she'll go to the emergency room for that.  Follow-Up Instructions: Return in about 10 days (around 08/01/2017).   Orders:  Orders Placed This Encounter  Procedures  . XR HIP UNILAT W OR W/O PELVIS 2-3 VIEWS LEFT  . XR Lumbar Spine 2-3 Views  . MR Lumbar Spine w/o contrast   Meds ordered this encounter  Medications  . DISCONTD: HYDROcodone-acetaminophen (NORCO/VICODIN) 5-325 MG tablet    Sig: Take 1 tablet by mouth every 6 (six) hours as needed for moderate pain.    Dispense:  30 tablet    Refill:  0    Order Specific Question:   Supervising Provider    Answer:   Garald Balding [[id number]]      Procedures: No procedures performed   Clinical Data: No additional findings.   Subjective: Chief Complaint  Patient presents with  . Left Hip - Pain, Numbness    [name] is a 56 y o that presents with chronic left hip pain x years. The pain is more intense that she has a hard time standing and now pain in her L foot. She ambulates with a cane.    HPI  [name] is a very pleasant 56 year old African-American female who is seen today for evaluation of left buttock and left leg pain. She's had chronic left  hip pain in the past for many years. She's also had pain very similar to sciatica. She did have an an MRI scan back in 2013 at that time she had progressive facet disease at L4-5 and L5-S1. She also had bulging mildly at L4-5 and L5-S1. She had mild left foraminal stenosis at L5-S1 with possible left L5 nerve root encroachment that time. At that time no high-grade spinal stenosis or focal disc herniation was identified. She comes in today thinking that that her symptoms are from her hip. She is having difficulty with ambulation. She does have some numbness in the leg. Also has a little weakness diffusely.  Review of Systems  Constitutional: Negative.   HENT: Negative.   Respiratory: Negative.   Cardiovascular: Negative.   Gastrointestinal: Negative.   Endocrine:       Thyroid disease  Genitourinary: Negative.   Skin: Negative.   Neurological: Negative.   Hematological: Negative.   Psychiatric/Behavioral: Negative.      Objective: Vital Signs: BP 125/82   Pulse 82   Resp 14   Ht 5\' 9"  (1.753 m)   Wt (!) 316 lb (143.3 kg)   BMI 46.67 kg/m  Physical Exam  Constitutional: She is oriented to person, place, and time. She appears well-developed and well-nourished.  HENT:  Head: Normocephalic and atraumatic.  Eyes: Pupils are equal, round, and reactive to light. EOM are normal.  Pulmonary/Chest: Effort normal.  Neurological: She is alert and oriented to person, place, and time.  Skin: Skin is warm and dry.  Psychiatric: She has a normal mood and affect. Her behavior is normal. Judgment and thought content normal.  Vitals reviewed.   Ortho Exam  She has negative straight leg raising bilaterally. No deep tendon reflexes in the lower extremities at all with testing. Sensation is intact to light touch. She has good strength equal bilaterally. Calves are supple and nontender.  Specialty Comments:  No specialty comments available.  Imaging: Xr Hip Unilat W Or W/o Pelvis 2-3 Views  Left  Result Date: 07/22/2017 AP pelvis and frog-leg lateral left hip reveals maintenance of joint space. Maybe a little bit of the inferior femoral acetabular narrowing. Maintaining a good joint space on the left neck she is more than on the right.  Xr Lumbar Spine 2-3 Views  Result Date: 07/22/2017 Two-view x-ray of the lumbar spine reveals a Disc disease at L5-S1. Also a lot of facet changes and cystic changes especially at L5 and S1. Mild degenerative scoliosis.    PMFS History: Patient Active Problem List   Diagnosis Date Noted  . Chronic left-sided low back pain with left-sided sciatica 07/22/2017  . Essential hypertension 03/13/2014  . Menometrorrhagia   . Dysmenorrhea   . Anemia   . Hyperlipidemia 05/23/2007  . OBESITY, MORBID 05/23/2007  . DYSFUNCTIONAL UTERINE BLEEDING - s/p LAVH 05/23/2007  . ROTATOR CUFF SYNDROME 05/23/2007  . KNEE PAIN, HX OF 05/23/2007   Past Medical History:  Diagnosis Date  . Anemia   . Anxiety   . Arthritis   . Diabetes mellitus without complication ([facility])    recent dx   . DVT (deep venous thrombosis) ([facility])    remote - took coumadin  . Dysmenorrhea   . Dysrhythmia    irregular heart beat  . Fibroid   . GERD (gastroesophageal reflux disease)   . Gout   . Hypercholesteremia   . Hypertension 4 months  . Menometrorrhagia   . Morbid obesity ([facility])   . Prediabetes   . Substance abuse ([facility])     Family History  Problem Relation Age of Onset  . Diabetes Mother   . Hypertension Mother   . Diabetes Father   . Hypertension Father   . Cancer Father 39  . Heart disease Unknown        No family history  . Cancer Brother 55    Past Surgical History:  Procedure Laterality Date  . ABDOMINAL HYSTERECTOMY  2008  . BUNIONECTOMY    . CHOLECYSTECTOMY    . ECTOPIC PREGNANCY SURGERY  1980's  . MANDIBLE OSTEOTOMY Bilateral 07/03/2013   Procedure: BILATERAL TORI;  Surgeon: Gae Bon, DDS;  Location: [facility];  Service: Oral Surgery;  Laterality:  Bilateral;  . NM MYOCAR PERF WALL MOTION  07/2012   lexiscan - normal pattern of perfusion, low risk  . ROTATOR CUFF REPAIR Bilateral 2009  . SLEEP STUDY  07/27/2012   AHI 4.8/hr  . TONSILLECTOMY    . TOOTH EXTRACTION N/A 07/03/2013   Procedure: DENTAL EXTRACTION # 20;  Surgeon: Gae Bon, DDS;  Location: [facility];  Service: Oral Surgery;  Laterality: N/A;  . TRANSTHORACIC ECHOCARDIOGRAM  07/2012   EF=>55%, mod  conc LVH; LA mod dilated; trace MR; mild TR with normal RVSP; trace AV regurg  . TUBAL LIGATION     Social History   Occupational History  .  Unemployed    Disability   Social History Main Topics  . Smoking status: Never Smoker  . Smokeless tobacco: Never Used  . Alcohol use No     Comment: Previous ETOH abuse  . Drug use: No     Comment: Previous crack use  . Sexual activity: No

## 2017-07-28 ENCOUNTER — Ambulatory Visit
Admission: RE | Admit: 2017-07-28 | Discharge: 2017-07-28 | Disposition: A | Discharge status: Home / Self Care | Payer: Medicaid Other | Source: Ambulatory Visit | Attending: Orthopedic Surgery | Admitting: Orthopedic Surgery

## 2017-07-29 ENCOUNTER — Other Ambulatory Visit: Payer: Self-pay | Admitting: Specialist

## 2017-07-29 DIAGNOSIS — Z1231 Encounter for screening mammogram for malignant neoplasm of breast: Secondary | ICD-10-CM

## 2017-07-30 ENCOUNTER — Other Ambulatory Visit: Payer: Self-pay

## 2017-08-10 ENCOUNTER — Encounter (INDEPENDENT_AMBULATORY_CARE_PROVIDER_SITE_OTHER): Payer: Self-pay | Admitting: Orthopaedic Surgery

## 2017-08-10 ENCOUNTER — Ambulatory Visit (INDEPENDENT_AMBULATORY_CARE_PROVIDER_SITE_OTHER): Payer: Medicaid Other | Admitting: Orthopaedic Surgery

## 2017-08-10 VITALS — BP 120/71 | HR 70 | Resp 16 | Ht 66.0 in | Wt 316.0 lb

## 2017-08-10 DIAGNOSIS — M5432 Sciatica, left side: Secondary | ICD-10-CM | POA: Diagnosis not present

## 2017-08-10 NOTE — Progress Notes (Signed)
Office Visit Note   Patient: [name]           Date of Birth: 1961/01/24           MRN: [id number] Visit Date: 08/10/2017              Requested by: Javier Docker, MD [address] [place], [place] [address] PCP: Javier Docker, MD   Assessment & Plan: Visit Diagnoses:  1. Sciatica, left side     1. Progressed degenerative change of the lower lumbar spine with superimposed RIGHT L4-5 facet acute reactive changes.  2. No canal stenosis. L4-5 L5-S1 neural foraminal narrowing: Moderate on the LEFT at L5-S1  Plan:  #1: Evaluation by Dr. Ernestina Patches for corticosteroid injections to the lumbar spine #2: If this is not beneficial than certainly we can plan on a neurosurgical consult but she is not interested at all in that.  Follow-Up Instructions: Return if symptoms worsen or fail to improve.   Face-to-face time spent with patient was greater than 20 minutes.  Greater than 50% of the time was spent in counseling and coordination of care.  Orders:  Orders Placed This Encounter  Procedures  . Ambulatory referral to Physical Medicine Rehab   No orders of the defined types were placed in this encounter.     Procedures: No procedures performed   Clinical Data: No additional findings.   Subjective: Chief Complaint  Patient presents with  . Lower Back - Results    [name] is a 56 y o that is here for MRI results of left buttock/back pain     HPI  [name] is a very pleasant 56 year old African-American female who has had chronic left buttock and left leg pain. She's had symptoms for many years and it had an MRI scan previously in 2013 revealing progressive facet degenerative changes and some mild left foraminal stenosis at the L5-S1 with possible left L5 nerve root encroachment at that time. She done fairly well over the past several years however most recently though she started to have pain and discomfort in the left leg consistent with  what was felt to be lumbar related origin. She did have some numbness in her leg and was concerned the fact she is a diabetic and was concerned whether this was vascular not. She also had noticed some weakness diffusely enlarged extremities. She returns today for review of her MRI scan.  Recently though her back pain is certainly worsened.     Review of Systems  Constitutional: Negative for chills, fatigue and fever.  Eyes: Negative for itching.  Respiratory: Negative for chest tightness and shortness of breath.   Cardiovascular: Positive for palpitations. Negative for chest pain and leg swelling.  Gastrointestinal: Negative for blood in stool, constipation and diarrhea.  Endocrine: Negative for polyuria.  Genitourinary: Negative for dysuria.  Musculoskeletal: Positive for back pain. Negative for joint swelling, neck pain and neck stiffness.  Allergic/Immunologic: Negative for immunocompromised state.  Neurological: Negative for dizziness and numbness.  Hematological: Does not bruise/bleed easily.  Psychiatric/Behavioral: Positive for sleep disturbance. The patient is nervous/anxious.      Objective: Vital Signs: BP 120/71   Pulse 70   Resp 16   Ht 5\' 6"  (1.676 m)   Wt (!) 316 lb (143.3 kg)   BMI 51.00 kg/m   Physical Exam  Constitutional: She is oriented to person, place, and time. She appears well-developed and well-nourished.  Obese  HENT:  Head: Normocephalic and  atraumatic.  Eyes: Pupils are equal, round, and reactive to light. EOM are normal.  Pulmonary/Chest: Effort normal.  Neurological: She is alert and oriented to person, place, and time.  Skin: Skin is warm and dry.  Psychiatric: She has a normal mood and affect. Her behavior is normal. Judgment and thought content normal.    Ortho Exam  Exam today reveals negative straight leg raising bilaterally. Her sensation is intact to light touch. Continues to have good strength bilaterally. Her deep tendon reflexes  remain absent. She appears little bit more uncomfortable today trying to sit up. She tends to sit on the right cheek, only. She is moving very slow and is difficult having difficulty getting up on the table.  Specialty Comments:  No specialty comments available.  Imaging: Mr Lumbar Spine W/o Contrast  Result Date: 07/28/2017 CLINICAL DATA:  LEFT buttock and leg pain for 1 month. No injury. Follow-up abnormal spine radiograph. EXAM: MRI LUMBAR SPINE WITHOUT CONTRAST TECHNIQUE: Multiplanar, multisequence MR imaging of the lumbar spine was performed. No intravenous contrast was administered. COMPARISON:  Lumbar spine radiograph July 22, 2017 and MRI of lumbar spine December 11, 2011 FINDINGS: SEGMENTATION: For the purposes of this report, the last well-formed intervertebral disc will be reported as L5-S1. ALIGNMENT: Maintained lumbar lordosis. No malalignment. VERTEBRAE:Vertebral bodies are intact. Intervertebral discs demonstrate normal morphology, mild desiccation lower lumbar discs. RIGHT L4-5 facet bone marrow edema. Mild chronic discogenic endplate changes H37-J6 and L1-2. No suspicious bone marrow signal. CONUS MEDULLARIS: Conus medullaris terminates at L1-2 and demonstrates normal morphology and signal characteristics. Cauda equina is normal. PARASPINAL AND SOFT TISSUES: Included prevertebral and paraspinal soft tissues are nonacute. DISC LEVELS: T12-L1: Small broad-based disc bulge without canal stenosis or neural foraminal narrowing. L1-2: Small broad-based disc bulge asymmetric the RIGHT. No canal stenosis. Mild RIGHT neural foraminal narrowing. L2-3: No disc bulge, canal stenosis nor neural foraminal narrowing. Mild to moderate facet arthropathy and ligamentum flavum redundancy. L3-4: No disc bulge, canal stenosis nor neural foraminal narrowing. Moderate facet arthropathy and ligamentum flavum redundancy L4-5: Annular bulging, RIGHT annular fissure. Moderate to severe facet arthropathy and ligamentum  flavum redundancy without canal stenosis. Mild widening of the LEFT facet with effusion. Mild bilateral neural foraminal narrowing. L5-S1: Small broad-based disc bulge. Mild RIGHT, moderate to severe LEFT facet arthropathy without canal stenosis. Moderate LEFT neural foraminal narrowing. IMPRESSION: 1. Progressed degenerative change of the lower lumbar spine with superimposed RIGHT L4-5 facet acute reactive changes. 2. No canal stenosis. L4-5 L5-S1 neural foraminal narrowing: Moderate on the LEFT at L5-S1. Electronically Signed   By: Elon Alas M.D.   On: 07/28/2017 19:55     PMFS History: Patient Active Problem List   Diagnosis Date Noted  . Chronic left-sided low back pain with left-sided sciatica 07/22/2017  . Essential hypertension 03/13/2014  . Menometrorrhagia   . Dysmenorrhea   . Anemia   . Hyperlipidemia 05/23/2007  . OBESITY, MORBID 05/23/2007  . DYSFUNCTIONAL UTERINE BLEEDING - s/p LAVH 05/23/2007  . ROTATOR CUFF SYNDROME 05/23/2007  . KNEE PAIN, HX OF 05/23/2007   Past Medical History:  Diagnosis Date  . Anemia   . Anxiety   . Arthritis   . Diabetes mellitus without complication ([facility])    recent dx   . DVT (deep venous thrombosis) ([facility])    remote - took coumadin  . Dysmenorrhea   . Dysrhythmia    irregular heart beat  . Fibroid   . GERD (gastroesophageal reflux disease)   . Gout   .  Hypercholesteremia   . Hypertension 4 months  . Menometrorrhagia   . Morbid obesity ([facility])   . Prediabetes   . Substance abuse ([facility])     Family History  Problem Relation Age of Onset  . Diabetes Mother   . Hypertension Mother   . Diabetes Father   . Hypertension Father   . Cancer Father 22  . Heart disease Unknown        No family history  . Cancer Brother 55    Past Surgical History:  Procedure Laterality Date  . ABDOMINAL HYSTERECTOMY  2008  . BUNIONECTOMY    . CHOLECYSTECTOMY    . ECTOPIC PREGNANCY SURGERY  1980's  . MANDIBLE OSTEOTOMY Bilateral 07/03/2013    Procedure: BILATERAL TORI;  Surgeon: Gae Bon, DDS;  Location: [facility];  Service: Oral Surgery;  Laterality: Bilateral;  . NM MYOCAR PERF WALL MOTION  07/2012   lexiscan - normal pattern of perfusion, low risk  . ROTATOR CUFF REPAIR Bilateral 2009  . SLEEP STUDY  07/27/2012   AHI 4.8/hr  . TONSILLECTOMY    . TOOTH EXTRACTION N/A 07/03/2013   Procedure: DENTAL EXTRACTION # 20;  Surgeon: Gae Bon, DDS;  Location: [facility];  Service: Oral Surgery;  Laterality: N/A;  . TRANSTHORACIC ECHOCARDIOGRAM  07/2012   EF=>55%, mod conc LVH; LA mod dilated; trace MR; mild TR with normal RVSP; trace AV regurg  . TUBAL LIGATION     Social History   Occupational History  .  Unemployed    Disability   Social History Main Topics  . Smoking status: Never Smoker  . Smokeless tobacco: Never Used  . Alcohol use No     Comment: Previous ETOH abuse  . Drug use: No     Comment: Previous crack use  . Sexual activity: No

## 2017-08-19 ENCOUNTER — Ambulatory Visit (INDEPENDENT_AMBULATORY_CARE_PROVIDER_SITE_OTHER): Payer: Medicaid Other | Admitting: Physical Medicine and Rehabilitation

## 2017-08-19 ENCOUNTER — Ambulatory Visit (INDEPENDENT_AMBULATORY_CARE_PROVIDER_SITE_OTHER): Payer: Self-pay

## 2017-08-19 ENCOUNTER — Encounter (INDEPENDENT_AMBULATORY_CARE_PROVIDER_SITE_OTHER): Payer: Self-pay | Admitting: Physical Medicine and Rehabilitation

## 2017-08-19 VITALS — BP 109/67 | HR 67 | Temp 97.4°F

## 2017-08-19 DIAGNOSIS — M5442 Lumbago with sciatica, left side: Secondary | ICD-10-CM | POA: Diagnosis not present

## 2017-08-19 DIAGNOSIS — G8929 Other chronic pain: Secondary | ICD-10-CM

## 2017-08-19 DIAGNOSIS — F1120 Opioid dependence, uncomplicated: Secondary | ICD-10-CM | POA: Diagnosis not present

## 2017-08-19 DIAGNOSIS — M47816 Spondylosis without myelopathy or radiculopathy, lumbar region: Secondary | ICD-10-CM | POA: Diagnosis not present

## 2017-08-19 DIAGNOSIS — M5416 Radiculopathy, lumbar region: Secondary | ICD-10-CM | POA: Diagnosis not present

## 2017-08-19 MED ORDER — BETAMETHASONE SOD PHOS & ACET 6 (3-3) MG/ML IJ SUSP
12.0000 mg | Freq: Once | INTRAMUSCULAR | Status: AC
  Administered 2017-08-19: 12 mg

## 2017-08-19 MED ORDER — LIDOCAINE HCL (PF) 1 % IJ SOLN
2.0000 mL | Freq: Once | INTRAMUSCULAR | Status: AC
  Administered 2017-08-19: 2 mL

## 2017-08-19 NOTE — Patient Instructions (Signed)

## 2017-08-19 NOTE — Progress Notes (Deleted)
Pt here for injection of lower back, left side is worse, radiates down to leg. No numbness or tingling. Pain is worse when standing still and putting weight on it. Pt taking morphine 35 mg and oxycodone 10-325mg  to help with pain. 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 Pt takes baby aspirin.   Chilton Greathouse, NP , Brandy Hale, MD Pain mgt and opiod use disorder, [facility] [place], moving to  [facility] [address] [place], [place] [address] fax: [phone]

## 2017-08-23 ENCOUNTER — Encounter (INDEPENDENT_AMBULATORY_CARE_PROVIDER_SITE_OTHER): Payer: Self-pay | Admitting: Physical Medicine and Rehabilitation

## 2017-08-23 NOTE — Progress Notes (Signed)
[name] - 56 y.o. female MRN [id number]  Date of birth: [birth year]  Office Visit Note: Visit Date: 08/19/2017 PCP: Javier Docker, MD Referred by: Javier Docker, MD  Subjective: Chief Complaint  Patient presents with  . Lower Back - Pain  . Left Leg - Pain   HPI: [name] is a 56 year old female who I have seen in the remote past and who is followed by Dr. Durward Fortes for her orthopedic care.  They recently saw her and she was having more left hip and leg pain and low back pain.  The last time I saw the patient was in 2016 for intra-articular hip injection.  Since that time she has been followed by Dr. Durward Fortes and she is also been obtaining opioid management to Dr. Brandy Hale and his nurse practitioner Derrik Ileana Roup their office [facility].  The patient takes long-acting morphine at 35 mg as well as breakthrough oxycodone 10 mg/325.  Patient is not aware if they do injections at that office.  She does know the office is moving to [place].  [name] has had prior radiofrequency ablation by myself and also a Designer, industrial/product.  This was of the L4-5 facet joint at the time seem to help her quite a bit.  Her most recent MRI is reviewed with her today.  She does have facet arthritis particularly on the right at L4-5 and more moderate on the left.  She has no focal nerve compression.  She does have degenerative changes throughout.  She does not have any focal central stenosis.  Findings seemingly out of proportion to the amount of pain medication.  She is morbidly obese and is a diabetic.  She does report chronic worsening low back.  She does not endorse any numbness or tingling.  She does endorse pain down the left leg.  Symptoms are worse with standing and ambulating and better at rest.  Despite significant opioid medication still has significant pain.    Review of Systems  Constitutional: Negative for chills, fever, malaise/fatigue and weight loss.    HENT: Negative for hearing loss and sinus pain.   Eyes: Negative for blurred vision, double vision and photophobia.  Respiratory: Negative for cough and shortness of breath.   Cardiovascular: Negative for chest pain, palpitations and leg swelling.  Gastrointestinal: Negative for abdominal pain, nausea and vomiting.  Genitourinary: Negative for flank pain.  Musculoskeletal: Positive for back pain and joint pain. Negative for myalgias.  Skin: Negative for itching and rash.  Neurological: Negative for tremors, focal weakness and weakness.  Endo/Heme/Allergies: Negative.   Psychiatric/Behavioral: Negative for depression.  All other systems reviewed and are negative.  Otherwise per HPI.  Assessment & Plan: Visit Diagnoses:  1. Lumbar radiculopathy   2. Spondylosis without myelopathy or radiculopathy, lumbar region   3. Chronic bilateral low back pain with left-sided sciatica   4. Uncomplicated opioid dependence ([place])     Plan: Findings:  Chronic pain syndrome and chronic low back pain with right more than left arthritis without nerve compression.  Prior radiofrequency ablation in 2009 and 2012.  The former was by myself in the latter was by [facility] imaging.  She is really been seen after that by Dr. Durward Fortes and Dr. Mirna Mires.  I am unsure if they do any injections at her pain management facility.  I had a long discussion with her today about having her pain told at one facility if possible.  She does not think that they do any injections there  and I am really unsure of that as well.  She does want to proceed with epidural injection just to see if it would help her.  She is going to follow-up with Dr. Mirna Mires at her regular scheduled time frame.  Dr. Durward Fortes per his note consider neurosurgical evaluation but the patient does not want to look at spine surgery.  I will today go ahead and complete one epidural injection on the left side for her radicular type pain.  Follow-up with Dr. Mirna Mires and  I will get a note to him as well her troll her total aspect of pain care.  If they are not doing injections and they would like [place] to repeat her radiofrequency ablation at any point we could do that. Otherwise, she will follow-up with Dr. Mirna Mires and Dr. Durward Fortes as I do not think there is much we could offer her.  With her significant history of opioid use we really cannot treat that at this office.    Meds & Orders:  Meds ordered this encounter  Medications  . lidocaine (PF) (XYLOCAINE) 1 % injection 2 mL  . betamethasone acetate-betamethasone sodium phosphate (CELESTONE) injection 12 mg    Orders Placed This Encounter  Procedures  . XR C-ARM NO REPORT  . Epidural Steroid injection    Follow-up: Return if symptoms worsen or fail to improve, for Dr. Durward Fortes.   Procedures: No procedures performed  Lumbar Epidural Steroid Injection - Interlaminar Approach with Fluoroscopic Guidance  Patient: [name]      Date of Birth: [birth year] MRN: [id number] PCP: Javier Docker, MD      Visit Date: 08/19/2017   Universal Protocol:     Consent Given By: the patient  Position: PRONE  Additional Comments: Vital signs were monitored before and after the procedure. Patient was prepped and draped in the usual sterile fashion. The correct patient, procedure, and site was verified.   Injection Procedure Details:  Procedure Site One Meds Administered:  Meds ordered this encounter  Medications  . lidocaine (PF) (XYLOCAINE) 1 % injection 2 mL  . betamethasone acetate-betamethasone sodium phosphate (CELESTONE) injection 12 mg     Laterality: Left  Location/Site:  L5-S1  Needle size: 20 G 4.5 in. length  Needle type: Tuohy  Needle Placement: Paramedian epidural  Findings:  -Contrast Used: 1 mL iohexol 180 mg iodine/mL   -Comments: Excellent flow of contrast into the epidural space.  Procedure Details: Using a paramedian approach from the side mentioned above, the  region overlying the inferior lamina was localized under fluoroscopic visualization and the soft tissues overlying this structure were infiltrated with 4 ml. of 1% Lidocaine without Epinephrine. The Tuohy needle was inserted into the epidural space using a paramedian approach.   The epidural space was localized using loss of resistance along with lateral and bi-planar fluoroscopic views.  After negative aspirate for air, blood, and CSF, a 2 ml. volume of Isovue-250 was injected into the epidural space and the flow of contrast was observed. Radiographs were obtained for documentation purposes.    The injectate was administered into the level noted above.   Additional Comments:  The patient tolerated the procedure well Dressing: Band-Aid    Post-procedure details: Patient was observed during the procedure. Post-procedure instructions were reviewed.  Patient left the clinic in stable condition.    Clinical History: MRI LUMBAR SPINE WITHOUT CONTRAST  TECHNIQUE: Multiplanar, multisequence MR imaging of the lumbar spine was performed. No intravenous contrast was administered.  COMPARISON:  Lumbar spine  radiograph July 22, 2017 and MRI of lumbar spine December 11, 2011  FINDINGS: SEGMENTATION: For the purposes of this report, the last well-formed intervertebral disc will be reported as L5-S1.  ALIGNMENT: Maintained lumbar lordosis. No malalignment.  VERTEBRAE:Vertebral bodies are intact. Intervertebral discs demonstrate normal morphology, mild desiccation lower lumbar discs. RIGHT L4-5 facet bone marrow edema. Mild chronic discogenic endplate changes X38-H8 and L1-2. No suspicious bone marrow signal.  CONUS MEDULLARIS: Conus medullaris terminates at L1-2 and demonstrates normal morphology and signal characteristics. Cauda equina is normal.  PARASPINAL AND SOFT TISSUES: Included prevertebral and paraspinal soft tissues are nonacute.  DISC LEVELS:  T12-L1: Small  broad-based disc bulge without canal stenosis or neural foraminal narrowing.  L1-2: Small broad-based disc bulge asymmetric the RIGHT. No canal stenosis. Mild RIGHT neural foraminal narrowing.  L2-3: No disc bulge, canal stenosis nor neural foraminal narrowing. Mild to moderate facet arthropathy and ligamentum flavum redundancy.  L3-4: No disc bulge, canal stenosis nor neural foraminal narrowing. Moderate facet arthropathy and ligamentum flavum redundancy  L4-5: Annular bulging, RIGHT annular fissure. Moderate to severe facet arthropathy and ligamentum flavum redundancy without canal stenosis. Mild widening of the LEFT facet with effusion. Mild bilateral neural foraminal narrowing.  L5-S1: Small broad-based disc bulge. Mild RIGHT, moderate to severe LEFT facet arthropathy without canal stenosis. Moderate LEFT neural foraminal narrowing.  IMPRESSION: 1. Progressed degenerative change of the lower lumbar spine with superimposed RIGHT L4-5 facet acute reactive changes. 2. No canal stenosis. L4-5 L5-S1 neural foraminal narrowing: Moderate on the LEFT at L5-S1.   Electronically Signed   By: Elon Alas M.D.   On: 07/28/2017 19:55  She reports that  has never smoked. she has never used smokeless tobacco. No results for input(s): HGBA1C, LABURIC in the last 8760 hours.  Objective:  VS:  HT:    WT:   BMI:     BP:109/67  HR:67bpm  TEMP:(!) 97.4 F (36.3 C)(Oral)  RESP:97 % Physical Exam  Constitutional: She is oriented to person, place, and time. She appears well-developed and well-nourished. No distress.  Obese  HENT:  Head: Normocephalic and atraumatic.  Nose: Nose normal.  Mouth/Throat: Oropharynx is clear and moist.  Eyes: Conjunctivae are normal. Pupils are equal, round, and reactive to light.  Neck: Normal range of motion. Neck supple.  Cardiovascular: Regular rhythm and intact distal pulses.  Pulmonary/Chest: Effort normal. No respiratory distress.    Abdominal: She exhibits no distension. There is no guarding.  Musculoskeletal:  Patient is very slow to arise from a seated position.  She ambulates with an antalgic gait to the left.  She is stiff with lumbar spine extension.  She has no trigger points or tender points.  No tenderness over the greater trochanters.  She does have pain with hip rotation but not specific to the groin.  She has good distal strength without clonus.  Neurological: She is alert and oriented to person, place, and time. She exhibits normal muscle tone. Coordination normal.  Skin: Skin is warm. No rash noted. No erythema.  Psychiatric: She has a normal mood and affect. Her behavior is normal.  Nursing note and vitals reviewed.   Ortho Exam Imaging: No results found.  Past Medical/Family/Surgical/Social History: Medications & Allergies reviewed per EMR Patient Active Problem List   Diagnosis Date Noted  . Chronic left-sided low back pain with left-sided sciatica 07/22/2017  . Essential hypertension 03/13/2014  . Menometrorrhagia   . Dysmenorrhea   . Anemia   . Hyperlipidemia 05/23/2007  . OBESITY,  MORBID 05/23/2007  . DYSFUNCTIONAL UTERINE BLEEDING - s/p LAVH 05/23/2007  . ROTATOR CUFF SYNDROME 05/23/2007  . KNEE PAIN, HX OF 05/23/2007   Past Medical History:  Diagnosis Date  . Anemia   . Anxiety   . Arthritis   . Diabetes mellitus without complication ([place])    recent dx   . DVT (deep venous thrombosis) ([facility])    remote - took coumadin  . Dysmenorrhea   . Dysrhythmia    irregular heart beat  . Fibroid   . GERD (gastroesophageal reflux disease)   . Gout   . Hypercholesteremia   . Hypertension 4 months  . Menometrorrhagia   . Morbid obesity ([place])   . Prediabetes   . Substance abuse ([place])    Family History  Problem Relation Age of Onset  . Diabetes Mother   . Hypertension Mother   . Diabetes Father   . Hypertension Father   . Cancer Father 18  . Heart disease Unknown        No family  history  . Cancer Brother 63   Past Surgical History:  Procedure Laterality Date  . ABDOMINAL HYSTERECTOMY  2008  . BUNIONECTOMY    . CHOLECYSTECTOMY    . ECTOPIC PREGNANCY SURGERY  1980's  . NM MYOCAR PERF WALL MOTION  07/2012   lexiscan - normal pattern of perfusion, low risk  . ROTATOR CUFF REPAIR Bilateral 2009  . SLEEP STUDY  07/27/2012   AHI 4.8/hr  . TONSILLECTOMY    . TRANSTHORACIC ECHOCARDIOGRAM  07/2012   EF=>55%, mod conc LVH; LA mod dilated; trace MR; mild TR with normal RVSP; trace AV regurg  . TUBAL LIGATION     Social History   Occupational History    Employer: UNEMPLOYED    Comment: Disability  Tobacco Use  . Smoking status: Never Smoker  . Smokeless tobacco: Never Used  Substance and Sexual Activity  . Alcohol use: No    Comment: Previous ETOH abuse  . Drug use: No    Comment: Previous crack use  . Sexual activity: No    Birth control/protection: Abstinence

## 2017-08-23 NOTE — Procedures (Signed)
Lumbar Epidural Steroid Injection - Interlaminar Approach with Fluoroscopic Guidance  Patient: [name]      Date of Birth: [birth year] MRN: [id number] PCP: Javier Docker, MD      Visit Date: 08/19/2017   Universal Protocol:     Consent Given By: the patient  Position: PRONE  Additional Comments: Vital signs were monitored before and after the procedure. Patient was prepped and draped in the usual sterile fashion. The correct patient, procedure, and site was verified.   Injection Procedure Details:  Procedure Site One Meds Administered:  Meds ordered this encounter  Medications  . lidocaine (PF) (XYLOCAINE) 1 % injection 2 mL  . betamethasone acetate-betamethasone sodium phosphate (CELESTONE) injection 12 mg     Laterality: Left  Location/Site:  L5-S1  Needle size: 20 G 4.5 in. length  Needle type: Tuohy  Needle Placement: Paramedian epidural  Findings:  -Contrast Used: 1 mL iohexol 180 mg iodine/mL   -Comments: Excellent flow of contrast into the epidural space.  Procedure Details: Using a paramedian approach from the side mentioned above, the region overlying the inferior lamina was localized under fluoroscopic visualization and the soft tissues overlying this structure were infiltrated with 4 ml. of 1% Lidocaine without Epinephrine. The Tuohy needle was inserted into the epidural space using a paramedian approach.   The epidural space was localized using loss of resistance along with lateral and bi-planar fluoroscopic views.  After negative aspirate for air, blood, and CSF, a 2 ml. volume of Isovue-250 was injected into the epidural space and the flow of contrast was observed. Radiographs were obtained for documentation purposes.    The injectate was administered into the level noted above.   Additional Comments:  The patient tolerated the procedure well Dressing: Band-Aid    Post-procedure details: Patient was observed during the  procedure. Post-procedure instructions were reviewed.  Patient left the clinic in stable condition.

## 2017-09-22 ENCOUNTER — Ambulatory Visit
Admission: RE | Admit: 2017-09-22 | Discharge: 2017-09-22 | Disposition: A | Discharge status: Home / Self Care | Payer: Medicaid Other | Source: Ambulatory Visit | Attending: Specialist | Admitting: Specialist

## 2017-09-22 DIAGNOSIS — Z1231 Encounter for screening mammogram for malignant neoplasm of breast: Secondary | ICD-10-CM

## 2017-12-22 ENCOUNTER — Other Ambulatory Visit: Payer: Self-pay

## 2017-12-22 ENCOUNTER — Encounter (HOSPITAL_COMMUNITY): Payer: Self-pay | Admitting: Emergency Medicine

## 2017-12-22 ENCOUNTER — Emergency Department (HOSPITAL_COMMUNITY)
Admission: EM | Admit: 2017-12-22 | Discharge: 2017-12-22 | Disposition: A | Discharge status: Home / Self Care | Payer: Medicaid Other | Attending: Emergency Medicine | Admitting: Emergency Medicine

## 2017-12-22 DIAGNOSIS — Z7984 Long term (current) use of oral hypoglycemic drugs: Secondary | ICD-10-CM | POA: Insufficient documentation

## 2017-12-22 DIAGNOSIS — Z86718 Personal history of other venous thrombosis and embolism: Secondary | ICD-10-CM | POA: Diagnosis not present

## 2017-12-22 DIAGNOSIS — I10 Essential (primary) hypertension: Secondary | ICD-10-CM | POA: Diagnosis not present

## 2017-12-22 DIAGNOSIS — Z79899 Other long term (current) drug therapy: Secondary | ICD-10-CM | POA: Insufficient documentation

## 2017-12-22 DIAGNOSIS — Z6841 Body Mass Index (BMI) 40.0 and over, adult: Secondary | ICD-10-CM | POA: Insufficient documentation

## 2017-12-22 DIAGNOSIS — G8929 Other chronic pain: Secondary | ICD-10-CM | POA: Diagnosis not present

## 2017-12-22 DIAGNOSIS — E119 Type 2 diabetes mellitus without complications: Secondary | ICD-10-CM | POA: Insufficient documentation

## 2017-12-22 DIAGNOSIS — M545 Low back pain, unspecified: Secondary | ICD-10-CM

## 2017-12-22 MED ORDER — DICLOFENAC SODIUM 1 % TD GEL: 4.0000 g | Freq: Four times a day (QID) | TRANSDERMAL | 0 refills | Status: AC

## 2017-12-22 MED ORDER — LIDOCAINE 5 % EX PTCH: 1.0000 | MEDICATED_PATCH | CUTANEOUS | 0 refills | Status: DC

## 2017-12-22 NOTE — ED Triage Notes (Signed)
Pt complaint of back pain now worse on right for 5 days; denies GU symptoms or injury.

## 2017-12-22 NOTE — ED Provider Notes (Signed)
[facility] DEPT Provider Note   CSN: [id number] Arrival date & time: 12/22/17  8921     History   Chief Complaint Chief Complaint  Patient presents with  . Back Pain    HPI [name] is a 57 y.o. female with a history of diabetes, morbid obesity, substance abuse, chronic back pain who presents the emergency department today for right-sided low back pain.  Patient states that she was doing home exercises approximately 5 days ago which called for turning and rotation of her trunk for a Pilates-like exercise.  She notes that during this exercise she felt a pulling of her right lower back and since that time has been having a achy, dull sensation in her right lower back without radiation into her abdomen or down her leg.  She notes that the pain becomes sharp in nature when she ambulates, bends down to pick something up or twists her back.  She is currently on oxycodone, Flexeril, Mobic, gabapentin, morphine at home which she says has been prior riding her mild to moderate relief. Denies history of cancer, trauma, fever, night pain, IV drug use, recent spinal manipulation or procedures, upper back pain or neck pain, numbness/tingling/weakness of the lower extremities, urinary retention, loss of bowel/bladder function, saddle anesthesia, or unexplained weight loss. Denies dysuria, flank pain, suprapubic pain, frequency, urgency, or hematuria.   HPI  Past Medical History:  Diagnosis Date  . Anemia   . Anxiety   . Arthritis   . Diabetes mellitus without complication ([place])    recent dx   . DVT (deep venous thrombosis) (HCC)    remote - took coumadin  . Dysmenorrhea   . Dysrhythmia    irregular heart beat  . Fibroid   . GERD (gastroesophageal reflux disease)   . Gout   . Hypercholesteremia   . Hypertension 4 months  . Menometrorrhagia   . Morbid obesity ([place])   . Prediabetes   . Substance abuse [facility])     Patient Active Problem List   Diagnosis Date Noted  . Chronic left-sided low back pain with left-sided sciatica 07/22/2017  . Essential hypertension 03/13/2014  . Menometrorrhagia   . Dysmenorrhea   . Anemia   . Hyperlipidemia 05/23/2007  . OBESITY, MORBID 05/23/2007  . DYSFUNCTIONAL UTERINE BLEEDING - s/p LAVH 05/23/2007  . ROTATOR CUFF SYNDROME 05/23/2007  . KNEE PAIN, HX OF 05/23/2007    Past Surgical History:  Procedure Laterality Date  . ABDOMINAL HYSTERECTOMY  2008  . BREAST BIOPSY Left 2013   benign  . BUNIONECTOMY    . CHOLECYSTECTOMY    . ECTOPIC PREGNANCY SURGERY  1980's  . MANDIBLE OSTEOTOMY Bilateral 07/03/2013   Procedure: BILATERAL TORI;  Surgeon: Gae Bon, DDS;  Location: [place];  Service: Oral Surgery;  Laterality: Bilateral;  . NM MYOCAR PERF WALL MOTION  07/2012   lexiscan - normal pattern of perfusion, low risk  . ROTATOR CUFF REPAIR Bilateral 2009  . SLEEP STUDY  07/27/2012   AHI 4.8/hr  . TONSILLECTOMY    . TOOTH EXTRACTION N/A 07/03/2013   Procedure: DENTAL EXTRACTION # 20;  Surgeon: Gae Bon, DDS;  Location: [place];  Service: Oral Surgery;  Laterality: N/A;  . TRANSTHORACIC ECHOCARDIOGRAM  07/2012   EF=>55%, mod conc LVH; LA mod dilated; trace MR; mild TR with normal RVSP; trace AV regurg  . TUBAL LIGATION      OB History    Gravida Para Term Preterm AB Living  8 6     1      SAB TAB Ectopic Multiple Live Births       1           Home Medications    Prior to Admission medications   Medication Sig Start Date End Date Taking? Authorizing Provider  allopurinol (ZYLOPRIM) 100 MG tablet Take 100 mg by mouth daily.    [provider]  aspirin 81 MG chewable tablet Chew 81 mg by mouth daily.    [provider]  atorvastatin (LIPITOR) 40 MG tablet Take 40 mg by mouth daily.    [provider]  busPIRone (BUSPAR) 7.5 MG tablet Take 7.5 mg by mouth 2 (two) times daily.     [provider]  cyanocobalamin 500 MCG tablet Take 500 mcg by  mouth daily.    [provider]  cyclobenzaprine (FLEXERIL) 10 MG tablet Take 10 mg by mouth 2 (two) times daily as needed for muscle spasms (muscle spasms). Muscle spasms    [provider]  diclofenac sodium (VOLTAREN) 1 % GEL Apply 2 g topically 2 (two) times daily.    [provider]  furosemide (LASIX) 20 MG tablet Take 20 mg by mouth daily.    [provider]  gabapentin (NEURONTIN) 300 MG capsule Take 300 mg by mouth 2 (two) times daily.    [provider]  glucose blood test strip TEST GLUCOSE TWICE A DAY 04/05/17   [provider]  INVOKANA 100 MG TABS tablet Take 100 mg by mouth 2 (two) times daily. 08/20/14   [provider]  loratadine (CLARITIN) 10 MG tablet Take 10 mg by mouth daily as needed for allergies (allergies).     [provider]  meloxicam (MOBIC) 15 MG tablet TAKE 1 TABLET BY MOUTH EVERY DAY 04/05/17   [provider]  metFORMIN (GLUCOPHAGE) 500 MG tablet Take 500 mg by mouth 2 (two) times daily with a meal.    [provider]  metoprolol succinate (TOPROL-XL) 25 MG 24 hr tablet Take 25 mg by mouth daily. 03/10/13   Terance Ice, MD  morphine (MS CONTIN) 15 MG 12 hr tablet Take 10 mg by mouth every 12 (twelve) hours.    [provider]  naloxegol oxalate (MOVANTIK) 25 MG TABS tablet TAKE 1 TABLET(S) BY MOUTH DAILY ON EMPTY STOMACH 1 HOUR BEFORE OR 2 HOURS AFTER FIRST MEAL. 04/02/17   [provider]  naproxen (NAPROSYN) 500 MG tablet Take 1 tablet (500 mg total) by mouth 2 (two) times daily. 09/07/14   Charlesetta Shanks, MD  Omega-3 Fatty Acids (FISH OIL) 1000 MG CAPS Take 1 capsule by mouth daily.    [provider]  omeprazole (PRILOSEC) 40 MG capsule Take 40 mg by mouth daily.    [provider]  oxyCODONE-acetaminophen (PERCOCET) 10-325 MG tablet Take 1 tablet by mouth 3 (three) times daily.    [provider]  potassium chloride SA  (K-DUR,KLOR-CON) 20 MEQ tablet Take 20 mEq by mouth daily.     [provider]    Family History Family History  Problem Relation Age of Onset  . Diabetes Mother   . Hypertension Mother   . Diabetes Father   . Hypertension Father   . Cancer Father 61  . Heart disease Unknown        No family history  . Cancer Brother 13    Social History Social History   Tobacco Use  . Smoking status: Never Smoker  .  Smokeless tobacco: Never Used  Substance Use Topics  . Alcohol use: No    Comment: Previous ETOH abuse  . Drug use: No    Comment: Previous crack use     Allergies   Buprenorphine hcl-naloxone hcl; Naloxone; and Dilaudid [hydromorphone hcl]   Review of Systems Review of Systems  All other systems reviewed and are negative.    Physical Exam Updated Vital Signs BP (!) 147/88 (BP Location: Left Arm)   Pulse 72   Temp 98.6 F (37 C) (Oral)   Resp 18   Wt (!) 140.8 kg (310 lb 8 oz)   SpO2 97%   BMI 50.12 kg/m   Physical Exam  Constitutional: She appears well-developed and well-nourished. No distress.  Non-toxic appearing  HENT:  Head: Normocephalic and atraumatic.  Right Ear: External ear normal.  Left Ear: External ear normal.  Neck: Normal range of motion. Neck supple. No spinous process tenderness present. No neck rigidity. Normal range of motion present.  Cardiovascular: Normal rate, regular rhythm, normal heart sounds and intact distal pulses.  No murmur heard. Pulses:      Radial pulses are 2+ on the right side, and 2+ on the left side.       Femoral pulses are 2+ on the right side, and 2+ on the left side.      Dorsalis pedis pulses are 2+ on the right side, and 2+ on the left side.       Posterior tibial pulses are 2+ on the right side, and 2+ on the left side.  Pulmonary/Chest: Effort normal and breath sounds normal. No respiratory distress.  Abdominal: Soft. Bowel sounds are normal. She exhibits no pulsatile midline mass. There is no  tenderness. There is no rigidity, no rebound and no CVA tenderness.  Musculoskeletal:       Right hip: Normal.       Left hip: Normal.       Back:  Posterior and appearance appears normal. No evidence of obvious scoliosis or kyphosis. No obvious signs of skin changes, trauma, deformity, infection. No C, T, or L spine tenderness or step-offs to palpation. No C, T  paraspinal tenderness.  Right lumbar paraspinal tenderness to palpation.  There is no left lumbar paraspinal tenderness to palpation.  No gluteal or hip tenderness palpation.  Lung expansion normal. Bilateral lower extremity strength 5 out of 5. Patellar and Achilles deep tendon reflex 2+ and equal bilaterally. Sensation of lower extremities grossly intact. Straight leg right neg. Straight leg left neg. Gait able but patient notes painful. Lower extremity compartments soft. PT and DP 2+ b/l. Cap refill <2 seconds.   Neurological: She is alert.  Skin: Skin is warm, dry and intact. Capillary refill takes less than 2 seconds. No rash noted. She is not diaphoretic. No erythema.  No vesicular-like rash.  Nursing note and vitals reviewed.    ED Treatments / Results  Labs (all labs ordered are listed, but only abnormal results are displayed) Labs Reviewed - No data to display  EKG  EKG Interpretation None       Radiology No results found.  Procedures Procedures (including critical care time)  Medications Ordered in ED Medications - No data to display   Initial Impression / Assessment and Plan / ED Course  I have reviewed the triage vital signs and the nursing notes.  Pertinent labs & imaging results that were available during my care of the patient were reviewed by me and considered in my medical decision  making (see chart for details).     57 y.o. year old female with back pain.  No neurologic deficits and normal neuro exam.  Patient can walk but states it is painful.  No bowel or bladder incontinence.  No urinary  retention or saddle anesthesia.  No concern for cauda equina.  No history of trauma, personal history of cancer, night sweats or weight loss that would warrant a x-ray at this time.  No spinal injections, fever or IV drug use that make me concerned for spinal hematoma or abscess.  No pulsatile mass of the abdomen.  No abdominal tenderness to palpation or guarding to make me concern for intra-abdominal pathology.  No urinary symptoms or CVA tenderness to make me concerned for cystitis versus pyelonephritis versus kidney stone.  No vesicular-like rash on the right lower back to make me concern for shingles.  Suspect patient's symptoms are musculoskeletal in nature given history and exam. Patient is without evidence of sciatica.  Will treat the patient with back exercises, activity modification, Voltaren gel an lidocaine patches. Patient is already on oral NSAIDs, muscle relaxer's, morphine and oxycodone. Patient is to follow-up with PCP  Strict return precautions discussed.  Patient appears safe for discharge.  Final Clinical Impressions(s) / ED Diagnoses   Final diagnoses:  Acute right-sided low back pain without sciatica    ED Discharge Orders        Ordered    lidocaine (LIDODERM) 5 %  Every 24 hours     12/22/17 1303    diclofenac sodium (VOLTAREN) 1 % GEL  4 times daily     12/22/17 1303       Lorelle Gibbs 12/22/17 1304    Fatima Blank, MD 12/22/17 1550

## 2017-12-22 NOTE — ED Notes (Signed)
Bed: WTR5 Expected date:  Expected time:  Means of arrival:  Comments: 

## 2017-12-22 NOTE — Discharge Instructions (Signed)
Your exam today was consistent with musculoskeletal back pain.  I have attached a handout on this.  Please read this informational handout.  I would like you to continue your muscle relaxers (flexeril) and use pain medication at home as prescribed.  I am providing you with back exercises to help strengthen the muscles around the area.  Please avoid pushing or pulling anything greater than 10 pounds over the next 1 week.  Please avoid strenuous activity but also make sure not to remain sedentary as this can make your back pain worse.  Use  medications as prescribed. Follow up with PCP in 1-2 week to discuss physical therapy if your symptoms are not improving.   Get help right away if: You develop new bowel or bladder control problems. You develop fever You have unusual weakness or numbness in your arms or legs. You develop nausea or vomiting. You develop abdominal pain. You feel faint.

## 2017-12-26 ENCOUNTER — Encounter (HOSPITAL_COMMUNITY): Payer: Self-pay | Admitting: Emergency Medicine

## 2017-12-26 ENCOUNTER — Emergency Department (HOSPITAL_COMMUNITY)
Admission: EM | Admit: 2017-12-26 | Discharge: 2017-12-26 | Disposition: A | Discharge status: Home / Self Care | Payer: Medicaid Other | Attending: Emergency Medicine | Admitting: Emergency Medicine

## 2017-12-26 DIAGNOSIS — Z79899 Other long term (current) drug therapy: Secondary | ICD-10-CM | POA: Diagnosis not present

## 2017-12-26 DIAGNOSIS — I10 Essential (primary) hypertension: Secondary | ICD-10-CM | POA: Insufficient documentation

## 2017-12-26 DIAGNOSIS — Z7982 Long term (current) use of aspirin: Secondary | ICD-10-CM | POA: Diagnosis not present

## 2017-12-26 DIAGNOSIS — T148XXA Other injury of unspecified body region, initial encounter: Secondary | ICD-10-CM

## 2017-12-26 DIAGNOSIS — M545 Low back pain: Secondary | ICD-10-CM | POA: Diagnosis present

## 2017-12-26 DIAGNOSIS — Z7984 Long term (current) use of oral hypoglycemic drugs: Secondary | ICD-10-CM | POA: Diagnosis not present

## 2017-12-26 DIAGNOSIS — M5441 Lumbago with sciatica, right side: Secondary | ICD-10-CM | POA: Diagnosis not present

## 2017-12-26 LAB — URINALYSIS, ROUTINE W REFLEX MICROSCOPIC
Bilirubin Urine: NEGATIVE
Glucose, UA: NEGATIVE mg/dL
Hgb urine dipstick: NEGATIVE
KETONES UR: NEGATIVE mg/dL
Leukocytes, UA: NEGATIVE
Nitrite: NEGATIVE
PH: 6 (ref 5.0–8.0)
Protein, ur: NEGATIVE mg/dL
RBC / HPF: NONE SEEN RBC/hpf (ref 0–5)
Specific Gravity, Urine: 1.009 (ref 1.005–1.030)

## 2017-12-26 LAB — I-STAT CHEM 8, ED
BUN: 5 mg/dL — ABNORMAL LOW (ref 6–20)
CHLORIDE: 104 mmol/L (ref 101–111)
CREATININE: 0.7 mg/dL (ref 0.44–1.00)
Calcium, Ion: 1.2 mmol/L (ref 1.15–1.40)
Glucose, Bld: 115 mg/dL — ABNORMAL HIGH (ref 65–99)
HEMATOCRIT: 41 % (ref 36.0–46.0)
HEMOGLOBIN: 13.9 g/dL (ref 12.0–15.0)
Potassium: 4.1 mmol/L (ref 3.5–5.1)
Sodium: 143 mmol/L (ref 135–145)
TCO2: 27 mmol/L (ref 22–32)

## 2017-12-26 NOTE — ED Provider Notes (Signed)
[facility] DEPT Provider Note   CSN: [id number] Arrival date & time: 12/26/17  1044     History   Chief Complaint Chief Complaint  Patient presents with  . Back Pain    HPI [name] is a 57 y.o. female.  HPI   Pt is a 57 y/o female with a h/o history of diabetes, morbid obesity, substance abuse, chronic back pain who presents to the ED today for evaluation of acute exacerbation of chronic back pain which began about 2 weeks ago. Sxs began after she was doing home exerises and turning/rotating her  back. States she does not have pain when she is not moving, but pain is 10/10 when she does turn/twist her back or tries to lift her leg. She states that initially the pain was located to the right lower back and did not radiate. States that yesterday she began to have radiating pain down the the groin and to the posterior aspect of the RLE. Denies numbness or paresthesias to the RLE.    She is currently on oxycodone, Flexeril, Mobic, gabapentin, morphine at home which she says has been prior riding her mild to moderate relief.    States she takes percocet, morphine 30mg  BID, flexeril, mobic, and gabapentin at home for chronic back pain. States that her home medications temporarily improve symptoms.  She was seen in the ED on 3/13 for similar sxs and dx with muscle strain. She has not filled her Rx for lidocaine patch or voltaren since her last visit as pharmacy did not have meds in stock. She follows with pain manegement but has not contacted them about her current pain.   Has h/o spinal injections, but denies any recent spinal injections. Last injection was over 2 months ago.   Denies abd pain, NVD, fevers, dysuria, frequency, hematuria.  Pt denies any numbness/tingling/weakness to the BLE. Denies saddle anesthesia. Denies loss of control of bowels or bladder. No urinary retention. No fevers. Denies a h/o IVDU. Denies a h/o CA or recent unintended  weight loss.   Past Medical History:  Diagnosis Date  . Anemia   . Anxiety   . Arthritis   . Diabetes mellitus without complication ([place])    recent dx   . DVT (deep venous thrombosis) (H[facility])    remote - took coumadin  . Dysmenorrhea   . Dysrhythmia    irregular heart beat  . Fibroid   . GERD (gastroesophageal reflux disease)   . Gout   . Hypercholesteremia   . Hypertension 4 months  . Menometrorrhagia   . Morbid obesity ([place])   . Prediabetes   . Substance abuse [facility])     Patient Active Problem List   Diagnosis Date Noted  . Chronic left-sided low back pain with left-sided sciatica 07/22/2017  . Essential hypertension 03/13/2014  . Menometrorrhagia   . Dysmenorrhea   . Anemia   . Hyperlipidemia 05/23/2007  . OBESITY, MORBID 05/23/2007  . DYSFUNCTIONAL UTERINE BLEEDING - s/p LAVH 05/23/2007  . ROTATOR CUFF SYNDROME 05/23/2007  . KNEE PAIN, HX OF 05/23/2007    Past Surgical History:  Procedure Laterality Date  . ABDOMINAL HYSTERECTOMY  2008  . BREAST BIOPSY Left 2013   benign  . BUNIONECTOMY    . CHOLECYSTECTOMY    . ECTOPIC PREGNANCY SURGERY  1980's  . MANDIBLE OSTEOTOMY Bilateral 07/03/2013   Procedure: BILATERAL TORI;  Surgeon: Gae Bon, DDS;  Location: [place];  Service: Oral Surgery;  Laterality: Bilateral;  .  NM MYOCAR PERF WALL MOTION  07/2012   lexiscan - normal pattern of perfusion, low risk  . ROTATOR CUFF REPAIR Bilateral 2009  . SLEEP STUDY  07/27/2012   AHI 4.8/hr  . TONSILLECTOMY    . TOOTH EXTRACTION N/A 07/03/2013   Procedure: DENTAL EXTRACTION # 20;  Surgeon: Gae Bon, DDS;  Location: [place];  Service: Oral Surgery;  Laterality: N/A;  . TRANSTHORACIC ECHOCARDIOGRAM  07/2012   EF=>55%, mod conc LVH; LA mod dilated; trace MR; mild TR with normal RVSP; trace AV regurg  . TUBAL LIGATION      OB History    Gravida Para Term Preterm AB Living   8 6     1      SAB TAB Ectopic Multiple Live Births       1           Home Medications     Prior to Admission medications   Medication Sig Start Date End Date Taking? Authorizing Provider  allopurinol (ZYLOPRIM) 100 MG tablet Take 100 mg by mouth daily.    [provider]  aspirin 81 MG chewable tablet Chew 81 mg by mouth daily.    [provider]  atorvastatin (LIPITOR) 40 MG tablet Take 40 mg by mouth daily.    [provider]  busPIRone (BUSPAR) 7.5 MG tablet Take 7.5 mg by mouth 2 (two) times daily.     [provider]  cyanocobalamin 500 MCG tablet Take 500 mcg by mouth daily.    [provider]  cyclobenzaprine (FLEXERIL) 10 MG tablet Take 10 mg by mouth 2 (two) times daily as needed for muscle spasms (muscle spasms). Muscle spasms    [provider]  diclofenac sodium (VOLTAREN) 1 % GEL Apply 4 g topically 4 (four) times daily. 12/22/17   Maczis, Barth Kirks, PA-C  furosemide (LASIX) 20 MG tablet Take 20 mg by mouth daily.    [provider]  gabapentin (NEURONTIN) 300 MG capsule Take 300 mg by mouth 2 (two) times daily.    [provider]  glucose blood test strip TEST GLUCOSE TWICE A DAY 04/05/17   [provider]  INVOKANA 100 MG TABS tablet Take 100 mg by mouth 2 (two) times daily. 08/20/14   [provider]  lidocaine (LIDODERM) 5 % Place 1 patch onto the skin daily. Remove & Discard patch within 12 hours or as directed by MD 12/22/17   Maczis, Barth Kirks, PA-C  loratadine (CLARITIN) 10 MG tablet Take 10 mg by mouth daily as needed for allergies (allergies).     [provider]  meloxicam (MOBIC) 15 MG tablet TAKE 1 TABLET BY MOUTH EVERY DAY 04/05/17   [provider]  metFORMIN (GLUCOPHAGE) 500 MG tablet Take 500 mg by mouth 2 (two) times daily with a meal.    [provider]  metoprolol succinate (TOPROL-XL) 25 MG 24 hr tablet Take 25 mg by mouth daily. 03/10/13   Terance Ice, MD  morphine (MS CONTIN) 15 MG 12 hr tablet Take 10 mg by mouth every 12 (twelve)  hours.    [provider]  naloxegol oxalate (MOVANTIK) 25 MG TABS tablet TAKE 1 TABLET(S) BY MOUTH DAILY ON EMPTY STOMACH 1 HOUR BEFORE OR 2 HOURS AFTER FIRST MEAL. 04/02/17   [provider]  naproxen (NAPROSYN) 500 MG tablet Take 1 tablet (500 mg total) by mouth 2 (two) times daily. 09/07/14   Charlesetta Shanks, MD  Omega-3 Fatty Acids (FISH OIL) 1000 MG  CAPS Take 1 capsule by mouth daily.    [provider]  omeprazole (PRILOSEC) 40 MG capsule Take 40 mg by mouth daily.    [provider]  oxyCODONE-acetaminophen (PERCOCET) 10-325 MG tablet Take 1 tablet by mouth 3 (three) times daily.    [provider]  potassium chloride SA (K-DUR,KLOR-CON) 20 MEQ tablet Take 20 mEq by mouth daily.     [provider]    Family History Family History  Problem Relation Age of Onset  . Diabetes Mother   . Hypertension Mother   . Diabetes Father   . Hypertension Father   . Cancer Father 90  . Heart disease Unknown        No family history  . Cancer Brother 44    Social History Social History   Tobacco Use  . Smoking status: Never Smoker  . Smokeless tobacco: Never Used  Substance Use Topics  . Alcohol use: No    Comment: Previous ETOH abuse  . Drug use: No    Comment: Previous crack use     Allergies   Buprenorphine hcl-naloxone hcl; Naloxone; and Dilaudid [hydromorphone hcl]   Review of Systems Review of Systems  Constitutional: Negative for fever and unexpected weight change.  HENT: Negative for congestion and rhinorrhea.   Eyes: Negative for visual disturbance.  Respiratory: Negative for shortness of breath.   Cardiovascular: Negative for chest pain and leg swelling.  Gastrointestinal: Negative for abdominal pain, constipation, diarrhea, nausea and vomiting.  Genitourinary: Negative for dysuria, flank pain, hematuria, pelvic pain and urgency.       No urinary or bowel incontinence, no urinary retention  Musculoskeletal:  Positive for back pain. Negative for neck pain.  Neurological: Negative for weakness, numbness and headaches.     Physical Exam Updated Vital Signs BP (!) 145/86 (BP Location: Left Arm)   Pulse 71   Temp 98.5 F (36.9 C) (Oral)   Resp 18   SpO2 98%   Physical Exam  Constitutional: She appears well-developed and well-nourished. No distress.  HENT:  Head: Normocephalic and atraumatic.  Eyes: Conjunctivae are normal.  Neck: Neck supple.  Cardiovascular: Normal rate and regular rhythm.  No murmur heard. Pulmonary/Chest: Effort normal and breath sounds normal. No respiratory distress.  Abdominal: Soft. Bowel sounds are normal. She exhibits no distension and no mass. There is no tenderness. There is no guarding.  Right CVA tenderness  Musculoskeletal: She exhibits no edema.  No TTP to the cervical, thoracic, or lumbar spine.  Tenderness to right lumbar and lower thoracic paraspinous muscles and along right iliac crest.  Tenderness along muscular to right mid back as well.  Pain is reproduced when patient rotates back towards left and towards the right.  Neurological: She is alert.  Motor:  Normal tone. 5/5 strength of BUE and BLE major muscle groups including strong and equal grip strength and dorsiflexion/plantar flexion Sensory: light touch normal in all extremities. DTRs: patellar and achilles 2+ symmetric b/l Gait: normal gait and balance. CV: 2+ radial and DP/PT pulses   Skin: Skin is warm and dry.  Psychiatric: She has a normal mood and affect.  Nursing note and vitals reviewed.    ED Treatments / Results  Labs (all labs ordered are listed, but only abnormal results are displayed) Labs Reviewed  URINALYSIS, ROUTINE W REFLEX MICROSCOPIC - Abnormal; Notable for the following components:      Result Value   Bacteria, UA RARE (*)    Squamous Epithelial / LPF 0-5 (*)  All other components within normal limits  I-STAT CHEM 8, ED - Abnormal; Notable for the following  components:   BUN 5 (*)    Glucose, Bld 115 (*)    All other components within normal limits    EKG  EKG Interpretation None       Radiology No results found.  Procedures Procedures (including critical care time)  Medications Ordered in ED Medications - No data to display   Initial Impression / Assessment and Plan / ED Course  I have reviewed the triage vital signs and the nursing notes.  Pertinent labs & imaging results that were available during my care of the patient were reviewed by me and considered in my medical decision making (see chart for details).    Final Clinical Impressions(s) / ED Diagnoses   Final diagnoses:  Muscle strain  Acute right-sided low back pain with right-sided sciatica    Patient with back pain.  Also with right-sided CVA tenderness, that is likely due to underlying muscle pain.  Patient has no pain to right mid back when she is not moving.  Her pain is reproduced with rotation of the back to the left and right.  UA negative for hematuria.  Chem-8 with normal renal function.  Doubt Pil[place] and doubt nephrolithiasis.  Do not suspect any other underlying intra-abdominal pathology based on patient's symptoms and exam.  Suspect patient has muscle strain from completing exercises recently.  No neurological deficits and normal neuro exam.  Patient can walk but states is painful.  No loss of bowel or bladder control.  No concern for cauda equina.  No fever, night sweats, weight loss, h/o cancer, IVDU.  RICE protocol and pain medicine indicated and discussed with patient.  Advised patient to follow-up with her primary care doctor within a week for reevaluation.  Advised to return to the emergency department for any new or worsening symptoms including any abdominal pain, pain in the right mid back that persists when she is not moving, vomiting, fevers, urinary symptoms.  All questions were answered and patient understands the plan.  She agrees to fill the  prescription she was given several days ago.   ED Discharge Orders    None       Rodney Booze, [place] 12/26/17 [place], Ankit, MD 12/26/17 1740

## 2017-12-26 NOTE — ED Triage Notes (Addendum)
Patient c/o right lower back pain radiating down right leg. Seen for same on 3/13. Denies injury and urinary sx. Ambulatory. States she has not gotten lidocaine patch prescription filled at this time.

## 2017-12-26 NOTE — Discharge Instructions (Signed)
You should go to the pharmacy and fill the prescriptions that you were given during your last visit.  If you start to have pain that is not associated with movement and is constant then you should return to the emergency department.  Please follow up with your primary care provider within 5-7 days for re-evaluation of your symptoms. If you do not have a primary care provider, information for a healthcare clinic has been provided for you to make arrangements for follow up care. Please return to the emergency department for any problems urinating, pain that is constant and not associated movement movement, abdominal pain, fevers, or any new or worsening symptoms.

## 2017-12-26 NOTE — ED Notes (Signed)
Bed: WTR9 Expected date:  Expected time:  Means of arrival:  Comments: 

## 2018-03-24 ENCOUNTER — Telehealth (INDEPENDENT_AMBULATORY_CARE_PROVIDER_SITE_OTHER): Payer: Self-pay | Admitting: *Deleted

## 2018-03-24 NOTE — Telephone Encounter (Signed)
Patient stopped by office to schedule as she was in the area. Her pain is mostly in the left groin, so I scheduled an ov with Dr. Durward Fortes.

## 2018-03-24 NOTE — Telephone Encounter (Signed)
If left hip groin pain then follow up Dr. Durward Fortes, if no trauma and pain in buttock/leg then ok for L4-5 interlam esi

## 2018-03-31 ENCOUNTER — Ambulatory Visit (INDEPENDENT_AMBULATORY_CARE_PROVIDER_SITE_OTHER): Payer: Medicaid Other

## 2018-03-31 ENCOUNTER — Encounter (INDEPENDENT_AMBULATORY_CARE_PROVIDER_SITE_OTHER): Payer: Self-pay | Admitting: Orthopaedic Surgery

## 2018-03-31 ENCOUNTER — Ambulatory Visit (INDEPENDENT_AMBULATORY_CARE_PROVIDER_SITE_OTHER): Payer: Medicaid Other | Admitting: Orthopaedic Surgery

## 2018-03-31 ENCOUNTER — Other Ambulatory Visit (INDEPENDENT_AMBULATORY_CARE_PROVIDER_SITE_OTHER): Payer: Self-pay | Admitting: Radiology

## 2018-03-31 VITALS — BP 133/82 | HR 69 | Ht 66.5 in | Wt 314.0 lb

## 2018-03-31 DIAGNOSIS — G8929 Other chronic pain: Secondary | ICD-10-CM

## 2018-03-31 DIAGNOSIS — M545 Low back pain: Principal | ICD-10-CM

## 2018-03-31 DIAGNOSIS — M25552 Pain in left hip: Secondary | ICD-10-CM

## 2018-03-31 DIAGNOSIS — M5442 Lumbago with sciatica, left side: Secondary | ICD-10-CM | POA: Diagnosis not present

## 2018-03-31 NOTE — Progress Notes (Signed)
Office Visit Note   Patient: [name]           Date of Birth: 1961/04/11           MRN: [id number] Visit Date: 03/31/2018              Requested by: Javier Docker, MD [address] [place], [place] [address] PCP: Javier Docker, MD   Assessment & Plan: Visit Diagnoses:  1. Pain in left hip   2. Chronic left-sided low back pain with left-sided sciatica     Plan: [name] is presently experiencing pain in her left groin.  When she did receive the epidural steroid injection in October it did alleviate her left groin pain.  However, she is limping which might be an indication that the problem is in her left hip.  X-rays were basically negative.  I would like Dr. Ernestina Patches to reevaluate [name] and consider a repeat epidural steroid injection and if that is not helpful consider an injection in her left hip  Follow-Up Instructions: No follow-ups on file.   Orders:  Orders Placed This Encounter  Procedures  . XR Pelvis 1-2 Views   No orders of the defined types were placed in this encounter.     Procedures: No procedures performed   Clinical Data: No additional findings.   Subjective: Chief Complaint  Patient presents with  . Left Hip - Pain  . Follow-up    l hip pain for 1 mo, had injection [place] around dec.   [name] is 57 years old has been seen on multiple occasions for problems referable to her back and left lower extremity as well as her left hip.  I scan in October 2018 revealing progressive degenerative changes of the lower lumbar spine with superimposed right L4-5 facet acute reactive change.  There was no evidence of canal stenosis.  She had moderate left neuroforaminal narrowing at L5-S1.  She did see Dr. Ernestina Patches who do performed an epidural steroid injection that she notes made a "big difference.  She is having recurrent pain the area of her left groin.  X-rays of her hip were performed October 2018 revealing a little  bit of anterior narrowing of the joint surface.  2016 she had an intra-articular cortisone injection by Dr. Ernestina Patches which apparently alleviated some discomfort.'s been unclear whether her problem is related to her hip relatively little x-ray change or her back.  She is on no chronic pain medicines per pain clinic no numbness or tingling  HPI  Review of Systems  Constitutional: Positive for fatigue. Negative for fever.  HENT: Negative for ear pain.   Eyes: Negative for pain.  Respiratory: Negative for cough and shortness of breath.   Cardiovascular: Negative for leg swelling.  Gastrointestinal: Positive for constipation. Negative for diarrhea.  Genitourinary: Negative for difficulty urinating.  Musculoskeletal: Positive for back pain. Negative for neck pain.  Skin: Negative for rash.  Allergic/Immunologic: Negative for food allergies.  Neurological: Negative for weakness and numbness.  Hematological: Does not bruise/bleed easily.  Psychiatric/Behavioral: Positive for sleep disturbance.     Objective: Vital Signs: BP 133/82 (BP Location: Left Arm, Patient Position: Sitting, Cuff Size: Normal)   Pulse 69   Ht 5' 6.5" (1.689 m)   Wt (!) 314 lb (142.4 kg)   BMI 49.92 kg/m   Physical Exam  Constitutional: She is oriented to person, place, and time. She appears well-developed and well-nourished.  HENT:  Mouth/Throat: Oropharynx is  clear and moist.  Eyes: Pupils are equal, round, and reactive to light. EOM are normal.  Pulmonary/Chest: Effort normal.  Neurological: She is alert and oriented to person, place, and time.  Skin: Skin is warm and dry.  Psychiatric: She has a normal mood and affect. Her behavior is normal.    Ortho Exam awake alert and oriented x3.  Comfortable sitting P.  BMI of over 50.  Walks with a limp-mild referable to her left side.  Straight leg raise negative.  Large legs.  Vascular exam appears to be intact.  Some mild pain with internal/external rotation of her  left hip  Specialty Comments:  No specialty comments available.  Imaging: Xr Pelvis 1-2 Views  Result Date: 03/31/2018 AP the pelvis demonstrates no significant change from the films that were performed in October 2018.  Hip joint spaces are still symmetrical and well-maintained.  Some sclerosis along the lateral acetabular rim left side. sacroiliac joints appear to be intact    PMFS History: Patient Active Problem List   Diagnosis Date Noted  . Chronic left-sided low back pain with left-sided sciatica 07/22/2017  . Essential hypertension 03/13/2014  . Menometrorrhagia   . Dysmenorrhea   . Anemia   . Hyperlipidemia 05/23/2007  . OBESITY, MORBID 05/23/2007  . DYSFUNCTIONAL UTERINE BLEEDING - s/p LAVH 05/23/2007  . ROTATOR CUFF SYNDROME 05/23/2007  . KNEE PAIN, HX OF 05/23/2007   Past Medical History:  Diagnosis Date  . Anemia   . Anxiety   . Arthritis   . Diabetes mellitus without complication ([facility])    recent dx   . DVT (deep venous thrombosis) ([facility])    remote - took coumadin  . Dysmenorrhea   . Dysrhythmia    irregular heart beat  . Fibroid   . GERD (gastroesophageal reflux disease)   . Gout   . Hypercholesteremia   . Hypertension 4 months  . Menometrorrhagia   . Morbid obesity ([place])   . Prediabetes   . Substance abuse ([employer])     Family History  Problem Relation Age of Onset  . Diabetes Mother   . Hypertension Mother   . Diabetes Father   . Hypertension Father   . Cancer Father 63  . Heart disease Unknown        No family history  . Cancer Brother 33    Past Surgical History:  Procedure Laterality Date  . ABDOMINAL HYSTERECTOMY  2008  . BREAST BIOPSY Left 2013   benign  . BUNIONECTOMY    . CHOLECYSTECTOMY    . ECTOPIC PREGNANCY SURGERY  1980's  . MANDIBLE OSTEOTOMY Bilateral 07/03/2013   Procedure: BILATERAL TORI;  Surgeon: Gae Bon, DDS;  Location: [facility];  Service: Oral Surgery;  Laterality: Bilateral;  . NM MYOCAR PERF WALL MOTION  07/2012    lexiscan - normal pattern of perfusion, low risk  . ROTATOR CUFF REPAIR Bilateral 2009  . SLEEP STUDY  07/27/2012   AHI 4.8/hr  . TONSILLECTOMY    . TOOTH EXTRACTION N/A 07/03/2013   Procedure: DENTAL EXTRACTION # 20;  Surgeon: Gae Bon, DDS;  Location: [facility];  Service: Oral Surgery;  Laterality: N/A;  . TRANSTHORACIC ECHOCARDIOGRAM  07/2012   EF=>55%, mod conc LVH; LA mod dilated; trace MR; mild TR with normal RVSP; trace AV regurg  . TUBAL LIGATION     Social History   Occupational History    Employer: UNEMPLOYED    Comment: Disability  Tobacco Use  . Smoking status: Never Smoker  .  Smokeless tobacco: Never Used  Substance and Sexual Activity  . Alcohol use: No    Comment: Previous ETOH abuse  . Drug use: No    Comment: Previous crack use  . Sexual activity: Never    Birth control/protection: Abstinence

## 2018-05-04 ENCOUNTER — Ambulatory Visit (INDEPENDENT_AMBULATORY_CARE_PROVIDER_SITE_OTHER): Payer: Medicaid Other | Admitting: Physical Medicine and Rehabilitation

## 2018-05-04 ENCOUNTER — Encounter (INDEPENDENT_AMBULATORY_CARE_PROVIDER_SITE_OTHER): Payer: Self-pay | Admitting: Physical Medicine and Rehabilitation

## 2018-05-04 ENCOUNTER — Ambulatory Visit (INDEPENDENT_AMBULATORY_CARE_PROVIDER_SITE_OTHER): Payer: Self-pay

## 2018-05-04 VITALS — BP 101/64 | HR 66

## 2018-05-04 DIAGNOSIS — M5416 Radiculopathy, lumbar region: Secondary | ICD-10-CM | POA: Diagnosis not present

## 2018-05-04 MED ORDER — METHYLPREDNISOLONE ACETATE 80 MG/ML IJ SUSP: 80.0000 mg | Freq: Once | INTRAMUSCULAR | Status: DC

## 2018-05-04 MED ORDER — METHYLPREDNISOLONE ACETATE 80 MG/ML IJ SUSP
40.0000 mg | Freq: Once | INTRAMUSCULAR | Status: AC
Start: 2018-05-04 — End: 2018-05-04
  Administered 2018-05-04: 40 mg

## 2018-05-04 NOTE — Progress Notes (Signed)
Pt states pain in left hip (groin pain) that radiates to left leg. Pt states pain started 1 month ago. Pt states last injection helped for awhile and gave 90% relief. Pt states lifting left leg makes pain worse, pain medication makes pain better.   .Numeric Pain Rating Scale and Functional Assessment Average Pain 10   In the last MONTH (on 0-10 scale) has pain interfered with the following?  1. General activity like being  able to carry out your everyday physical activities such as walking, climbing stairs, carrying groceries, or moving a chair?  Rating(6)   +Driver, -BT, -Dye Allergies.

## 2018-05-04 NOTE — Patient Instructions (Signed)

## 2018-05-16 NOTE — Progress Notes (Signed)
[name] - 57 y.o. female MRN [id number]  Date of birth: 06-25-1961  Office Visit Note: Visit Date: 05/04/2018 PCP: Javier Docker, MD Referred by: Javier Docker, MD  Subjective: Chief Complaint  Patient presents with  . Left Hip - Pain  . Left Leg - Pain   HPI: [name] is a 57 year old female who is well-known to [place] over the years with intermittent epidural injection for low back and hip and leg pain.  She is followed by Dr. Durward Fortes who kindly sends her in again today for repeat epidural injection.  She is having left hip and groin pain and this is consistent with what she had in October when we saw her.  He is evaluated her hips with x-ray and exam and is felt like her hips seem to be pretty good.  He still unsure about the source of her pain although she did get 90% relief with the epidural injection so we will try that again today from a diagnostic standpoint.  If it is not beneficial we would look at intra-articular hip injection with fluoroscopic guidance.  Patient's case is complicated by morbid obesity.   ROS Otherwise per HPI.  Assessment & Plan: Visit Diagnoses:  1. Lumbar radiculopathy     Plan: No additional findings.   Meds & Orders:  Meds ordered this encounter  Medications  . DISCONTD: methylPREDNISolone acetate (DEPO-MEDROL) injection 80 mg  . methylPREDNISolone acetate (DEPO-MEDROL) injection 40 mg    Orders Placed This Encounter  Procedures  . XR C-ARM NO REPORT  . Epidural Steroid injection    Follow-up: Return if symptoms worsen or fail to improve.   Procedures: No procedures performed  Lumbar Epidural Steroid Injection - Interlaminar Approach with Fluoroscopic Guidance  Patient: [name]      Date of Birth: 1961/01/29 MRN: [id number] PCP: Javier Docker, MD      Visit Date: 05/04/2018   Universal Protocol:     Consent Given By: the patient  Position: PRONE  Additional Comments: Vital signs were  monitored before and after the procedure. Patient was prepped and draped in the usual sterile fashion. The correct patient, procedure, and site was verified.   Injection Procedure Details:  Procedure Site One Meds Administered:  Meds ordered this encounter  Medications  . DISCONTD: methylPREDNISolone acetate (DEPO-MEDROL) injection 80 mg  . methylPREDNISolone acetate (DEPO-MEDROL) injection 40 mg     Laterality: Left  Location/Site:  L5-S1  Needle size: 20 G  Needle type: Tuohy  Needle Placement: Paramedian epidural  Findings:   -Comments: Excellent flow of contrast into the epidural space.  Procedure Details: Using a paramedian approach from the side mentioned above, the region overlying the inferior lamina was localized under fluoroscopic visualization and the soft tissues overlying this structure were infiltrated with 4 ml. of 1% Lidocaine without Epinephrine. The Tuohy needle was inserted into the epidural space using a paramedian approach.   The epidural space was localized using loss of resistance along with lateral and bi-planar fluoroscopic views.  After negative aspirate for air, blood, and CSF, a 2 ml. volume of Isovue-250 was injected into the epidural space and the flow of contrast was observed. Radiographs were obtained for documentation purposes.    The injectate was administered into the level noted above.   Additional Comments:  The patient tolerated the procedure well Dressing: Band-Aid    Post-procedure details: Patient was observed during the procedure. Post-procedure instructions were reviewed.  Patient left the clinic in  stable condition.   Clinical History: MRI LUMBAR SPINE WITHOUT CONTRAST  TECHNIQUE: Multiplanar, multisequence MR imaging of the lumbar spine was performed. No intravenous contrast was administered.  COMPARISON:  Lumbar spine radiograph July 22, 2017 and MRI of lumbar spine December 11, 2011  FINDINGS: SEGMENTATION: For  the purposes of this report, the last well-formed intervertebral disc will be reported as L5-S1.  ALIGNMENT: Maintained lumbar lordosis. No malalignment.  VERTEBRAE:Vertebral bodies are intact. Intervertebral discs demonstrate normal morphology, mild desiccation lower lumbar discs. RIGHT L4-5 facet bone marrow edema. Mild chronic discogenic endplate changes Z36-U4 and L1-2. No suspicious bone marrow signal.  CONUS MEDULLARIS: Conus medullaris terminates at L1-2 and demonstrates normal morphology and signal characteristics. Cauda equina is normal.  PARASPINAL AND SOFT TISSUES: Included prevertebral and paraspinal soft tissues are nonacute.  DISC LEVELS:  T12-L1: Small broad-based disc bulge without canal stenosis or neural foraminal narrowing.  L1-2: Small broad-based disc bulge asymmetric the RIGHT. No canal stenosis. Mild RIGHT neural foraminal narrowing.  L2-3: No disc bulge, canal stenosis nor neural foraminal narrowing. Mild to moderate facet arthropathy and ligamentum flavum redundancy.  L3-4: No disc bulge, canal stenosis nor neural foraminal narrowing. Moderate facet arthropathy and ligamentum flavum redundancy  L4-5: Annular bulging, RIGHT annular fissure. Moderate to severe facet arthropathy and ligamentum flavum redundancy without canal stenosis. Mild widening of the LEFT facet with effusion. Mild bilateral neural foraminal narrowing.  L5-S1: Small broad-based disc bulge. Mild RIGHT, moderate to severe LEFT facet arthropathy without canal stenosis. Moderate LEFT neural foraminal narrowing.  IMPRESSION: 1. Progressed degenerative change of the lower lumbar spine with superimposed RIGHT L4-5 facet acute reactive changes. 2. No canal stenosis. L4-5 L5-S1 neural foraminal narrowing: Moderate on the LEFT at L5-S1.   Electronically Signed   By: Elon Alas M.D.   On: 07/28/2017 19:55   She reports that she has never smoked. She has never used  smokeless tobacco. No results for input(s): HGBA1C, LABURIC in the last 8760 hours.  Objective:  VS:  HT:    WT:   BMI:     BP:101/64  HR:66bpm  TEMP: ( )  RESP:  Physical Exam  Ortho Exam Imaging: No results found.  Past Medical/Family/Surgical/Social History: Medications & Allergies reviewed per EMR, new medications updated. Patient Active Problem List   Diagnosis Date Noted  . Chronic left-sided low back pain with left-sided sciatica 07/22/2017  . Essential hypertension 03/13/2014  . Menometrorrhagia   . Dysmenorrhea   . Anemia   . Hyperlipidemia 05/23/2007  . OBESITY, MORBID 05/23/2007  . DYSFUNCTIONAL UTERINE BLEEDING - s/p LAVH 05/23/2007  . ROTATOR CUFF SYNDROME 05/23/2007  . KNEE PAIN, HX OF 05/23/2007   Past Medical History:  Diagnosis Date  . Anemia   . Anxiety   . Arthritis   . Diabetes mellitus without complication ([place])    recent dx   . DVT (deep venous thrombosis) ([facility])    remote - took coumadin  . Dysmenorrhea   . Dysrhythmia    irregular heart beat  . Fibroid   . GERD (gastroesophageal reflux disease)   . Gout   . Hypercholesteremia   . Hypertension 4 months  . Menometrorrhagia   . Morbid obesity ([place])   . Prediabetes   . Substance abuse ([place])    Family History  Problem Relation Age of Onset  . Diabetes Mother   . Hypertension Mother   . Diabetes Father   . Hypertension Father   . Cancer Father 44  . Heart disease  Unknown        No family history  . Cancer Brother 40   Past Surgical History:  Procedure Laterality Date  . ABDOMINAL HYSTERECTOMY  2008  . BREAST BIOPSY Left 2013   benign  . BUNIONECTOMY    . CHOLECYSTECTOMY    . ECTOPIC PREGNANCY SURGERY  1980's  . MANDIBLE OSTEOTOMY Bilateral 07/03/2013   Procedure: BILATERAL TORI;  Surgeon: Gae Bon, DDS;  Location: [facility];  Service: Oral Surgery;  Laterality: Bilateral;  . NM MYOCAR PERF WALL MOTION  07/2012   lexiscan - normal pattern of perfusion, low risk  . ROTATOR CUFF  REPAIR Bilateral 2009  . SLEEP STUDY  07/27/2012   AHI 4.8/hr  . TONSILLECTOMY    . TOOTH EXTRACTION N/A 07/03/2013   Procedure: DENTAL EXTRACTION # 20;  Surgeon: Gae Bon, DDS;  Location: [facility];  Service: Oral Surgery;  Laterality: N/A;  . TRANSTHORACIC ECHOCARDIOGRAM  07/2012   EF=>55%, mod conc LVH; LA mod dilated; trace MR; mild TR with normal RVSP; trace AV regurg  . TUBAL LIGATION     Social History   Occupational History    Employer: UNEMPLOYED    Comment: Disability  Tobacco Use  . Smoking status: Never Smoker  . Smokeless tobacco: Never Used  Substance and Sexual Activity  . Alcohol use: No    Comment: Previous ETOH abuse  . Drug use: No    Comment: Previous crack use  . Sexual activity: Never    Birth control/protection: Abstinence

## 2018-05-16 NOTE — Procedures (Signed)
Lumbar Epidural Steroid Injection - Interlaminar Approach with Fluoroscopic Guidance  Patient: [name]      Date of Birth: 11-18-1960 MRN: [id number] PCP: Javier Docker, MD      Visit Date: 05/04/2018   Universal Protocol:     Consent Given By: the patient  Position: PRONE  Additional Comments: Vital signs were monitored before and after the procedure. Patient was prepped and draped in the usual sterile fashion. The correct patient, procedure, and site was verified.   Injection Procedure Details:  Procedure Site One Meds Administered:  Meds ordered this encounter  Medications  . DISCONTD: methylPREDNISolone acetate (DEPO-MEDROL) injection 80 mg  . methylPREDNISolone acetate (DEPO-MEDROL) injection 40 mg     Laterality: Left  Location/Site:  L5-S1  Needle size: 20 G  Needle type: Tuohy  Needle Placement: Paramedian epidural  Findings:   -Comments: Excellent flow of contrast into the epidural space.  Procedure Details: Using a paramedian approach from the side mentioned above, the region overlying the inferior lamina was localized under fluoroscopic visualization and the soft tissues overlying this structure were infiltrated with 4 ml. of 1% Lidocaine without Epinephrine. The Tuohy needle was inserted into the epidural space using a paramedian approach.   The epidural space was localized using loss of resistance along with lateral and bi-planar fluoroscopic views.  After negative aspirate for air, blood, and CSF, a 2 ml. volume of Isovue-250 was injected into the epidural space and the flow of contrast was observed. Radiographs were obtained for documentation purposes.    The injectate was administered into the level noted above.   Additional Comments:  The patient tolerated the procedure well Dressing: Band-Aid    Post-procedure details: Patient was observed during the procedure. Post-procedure instructions were reviewed.  Patient left the  clinic in stable condition.

## 2018-07-01 ENCOUNTER — Other Ambulatory Visit: Payer: Self-pay | Admitting: Specialist

## 2018-07-01 DIAGNOSIS — Z139 Encounter for screening, unspecified: Secondary | ICD-10-CM

## 2018-07-15 ENCOUNTER — Other Ambulatory Visit: Payer: Self-pay | Admitting: Otolaryngology

## 2018-07-15 DIAGNOSIS — E041 Nontoxic single thyroid nodule: Secondary | ICD-10-CM

## 2018-07-20 ENCOUNTER — Ambulatory Visit
Admission: RE | Admit: 2018-07-20 | Discharge: 2018-07-20 | Disposition: A | Discharge status: Home / Self Care | Payer: Medicaid Other | Source: Ambulatory Visit | Attending: Otolaryngology | Admitting: Otolaryngology

## 2018-07-20 DIAGNOSIS — E041 Nontoxic single thyroid nodule: Secondary | ICD-10-CM

## 2018-07-29 ENCOUNTER — Other Ambulatory Visit: Payer: Self-pay | Admitting: Otolaryngology

## 2018-07-29 DIAGNOSIS — E041 Nontoxic single thyroid nodule: Secondary | ICD-10-CM

## 2018-08-17 ENCOUNTER — Ambulatory Visit
Admission: RE | Admit: 2018-08-17 | Discharge: 2018-08-17 | Disposition: A | Discharge status: Home / Self Care | Payer: Medicaid Other | Source: Ambulatory Visit | Attending: Otolaryngology | Admitting: Otolaryngology

## 2018-08-17 ENCOUNTER — Other Ambulatory Visit (HOSPITAL_COMMUNITY)
Admission: RE | Admit: 2018-08-17 | Discharge: 2018-08-17 | Disposition: A | Discharge status: Home / Self Care | Payer: Medicaid Other | Source: Ambulatory Visit | Attending: Radiology | Admitting: Radiology

## 2018-08-17 DIAGNOSIS — E041 Nontoxic single thyroid nodule: Secondary | ICD-10-CM | POA: Insufficient documentation

## 2018-09-15 ENCOUNTER — Other Ambulatory Visit: Payer: Self-pay | Admitting: Gastroenterology

## 2018-09-23 ENCOUNTER — Ambulatory Visit: Payer: Medicaid Other

## 2018-10-25 ENCOUNTER — Encounter (HOSPITAL_COMMUNITY): Payer: Self-pay | Admitting: *Deleted

## 2018-10-25 ENCOUNTER — Other Ambulatory Visit: Payer: Self-pay

## 2018-10-27 ENCOUNTER — Other Ambulatory Visit: Payer: Self-pay

## 2018-10-27 ENCOUNTER — Encounter (HOSPITAL_COMMUNITY): Payer: Self-pay | Admitting: *Deleted

## 2018-10-27 ENCOUNTER — Encounter (HOSPITAL_COMMUNITY)
Admission: RE | Disposition: A | Discharge status: Home / Self Care | Payer: Self-pay | Source: Home / Self Care | Attending: Gastroenterology

## 2018-10-27 ENCOUNTER — Ambulatory Visit (HOSPITAL_COMMUNITY)
Admission: RE | Admit: 2018-10-27 | Discharge: 2018-10-27 | Disposition: A | Discharge status: Home / Self Care | Payer: Medicaid Other | Attending: Gastroenterology | Admitting: Gastroenterology

## 2018-10-27 ENCOUNTER — Ambulatory Visit (HOSPITAL_COMMUNITY): Payer: Medicaid Other | Admitting: Certified Registered Nurse Anesthetist

## 2018-10-27 DIAGNOSIS — Z79891 Long term (current) use of opiate analgesic: Secondary | ICD-10-CM | POA: Insufficient documentation

## 2018-10-27 DIAGNOSIS — Z86718 Personal history of other venous thrombosis and embolism: Secondary | ICD-10-CM | POA: Diagnosis not present

## 2018-10-27 DIAGNOSIS — F419 Anxiety disorder, unspecified: Secondary | ICD-10-CM | POA: Insufficient documentation

## 2018-10-27 DIAGNOSIS — M109 Gout, unspecified: Secondary | ICD-10-CM | POA: Diagnosis not present

## 2018-10-27 DIAGNOSIS — Z791 Long term (current) use of non-steroidal anti-inflammatories (NSAID): Secondary | ICD-10-CM | POA: Insufficient documentation

## 2018-10-27 DIAGNOSIS — Z7982 Long term (current) use of aspirin: Secondary | ICD-10-CM | POA: Diagnosis not present

## 2018-10-27 DIAGNOSIS — Z1211 Encounter for screening for malignant neoplasm of colon: Secondary | ICD-10-CM | POA: Insufficient documentation

## 2018-10-27 DIAGNOSIS — I10 Essential (primary) hypertension: Secondary | ICD-10-CM | POA: Insufficient documentation

## 2018-10-27 DIAGNOSIS — Z800 Family history of malignant neoplasm of digestive organs: Secondary | ICD-10-CM | POA: Insufficient documentation

## 2018-10-27 DIAGNOSIS — Z87891 Personal history of nicotine dependence: Secondary | ICD-10-CM | POA: Diagnosis not present

## 2018-10-27 DIAGNOSIS — Z6841 Body Mass Index (BMI) 40.0 and over, adult: Secondary | ICD-10-CM | POA: Insufficient documentation

## 2018-10-27 DIAGNOSIS — E119 Type 2 diabetes mellitus without complications: Secondary | ICD-10-CM | POA: Diagnosis not present

## 2018-10-27 DIAGNOSIS — Z79899 Other long term (current) drug therapy: Secondary | ICD-10-CM | POA: Diagnosis not present

## 2018-10-27 HISTORY — PX: COLONOSCOPY WITH PROPOFOL: SHX5780

## 2018-10-27 LAB — GLUCOSE, CAPILLARY: Glucose-Capillary: 98 mg/dL (ref 70–99)

## 2018-10-27 SURGERY — COLONOSCOPY WITH PROPOFOL
Anesthesia: Monitor Anesthesia Care

## 2018-10-27 MED ORDER — LIDOCAINE HCL (CARDIAC) PF 100 MG/5ML IV SOSY
PREFILLED_SYRINGE | INTRAVENOUS | Status: DC | PRN
  Administered 2018-10-27: 100 mg via INTRAVENOUS

## 2018-10-27 MED ORDER — LACTATED RINGERS IV SOLN
INTRAVENOUS | Status: DC
  Administered 2018-10-27: 1000 mL via INTRAVENOUS

## 2018-10-27 MED ORDER — PROPOFOL 10 MG/ML IV BOLUS
INTRAVENOUS | Status: AC
  Filled 2018-10-27: qty 60

## 2018-10-27 MED ORDER — PROPOFOL 500 MG/50ML IV EMUL
INTRAVENOUS | Status: DC | PRN
  Administered 2018-10-27: 100 ug/kg/min via INTRAVENOUS

## 2018-10-27 SURGICAL SUPPLY — 22 items

## 2018-10-27 NOTE — H&P (Signed)
Patient here for a screening colonoscopy because of a family history of colon cancer  PE No distress, heart RRR, Lungs clear. Abdomen non tender.  IMP: Family history of colon cancer in her father.  Plan  colonoscopy

## 2018-10-27 NOTE — Discharge Instructions (Signed)

## 2018-10-27 NOTE — Transfer of Care (Signed)
Immediate Anesthesia Transfer of Care Note  Patient: [name]  Procedure(s) Performed: COLONOSCOPY WITH PROPOFOL (N/A )  Patient Location: PACU and Endoscopy Unit  Anesthesia Type:MAC  Level of Consciousness: awake, oriented, drowsy and patient cooperative  Airway & Oxygen Therapy: Patient Spontanous Breathing and Patient connected to face mask oxygen  Post-op Assessment: Report given to RN, Post -op Vital signs reviewed and stable and Patient moving all extremities  Post vital signs: Reviewed and stable  Last Vitals:  Vitals Value Taken Time  BP    Temp    Pulse    Resp 16 10/27/2018  1:32 PM  SpO2    Vitals shown include unvalidated device data.  Last Pain:  Vitals:   10/27/18 1148  TempSrc: Oral  PainSc: 0-No pain         Complications: No apparent anesthesia complications

## 2018-10-27 NOTE — Anesthesia Postprocedure Evaluation (Signed)
Anesthesia Post Note  Patient: [name]  Procedure(s) Performed: COLONOSCOPY WITH PROPOFOL (N/A )     Patient location during evaluation: PACU Anesthesia Type: MAC Level of consciousness: awake and alert Pain management: pain level controlled Vital Signs Assessment: post-procedure vital signs reviewed and stable Respiratory status: spontaneous breathing, nonlabored ventilation, respiratory function stable and patient connected to nasal cannula oxygen Cardiovascular status: stable and blood pressure returned to baseline Postop Assessment: no apparent nausea or vomiting Anesthetic complications: no    Last Vitals:  Vitals:   10/27/18 1345 10/27/18 1350  BP: (!) 104/59 (!) 106/43  Pulse: 69 65  Resp: 11 13  Temp:    SpO2: 100% 99%    Last Pain:  Vitals:   10/27/18 1350  TempSrc:   PainSc: 0-No pain                 Ryan P Ellender

## 2018-10-27 NOTE — Anesthesia Preprocedure Evaluation (Addendum)
Anesthesia Evaluation  Patient identified by MRN, date of birth, ID band Patient awake    Reviewed: Allergy & Precautions, NPO status , Patient's Chart, lab work & pertinent test results, reviewed documented beta blocker date and time   Airway Mallampati: II  TM Distance: >3 FB Neck ROM: Full    Dental  (+) Upper Dentures   Pulmonary neg pulmonary ROS,    Pulmonary exam normal breath sounds clear to auscultation       Cardiovascular hypertension, Pt. on home beta blockers + DVT  Normal cardiovascular exam Rhythm:Regular Rate:Normal  Stress Test Blood pressure demonstrated a normal response to exercise. No T wave inversion was noted during stress. There was no ST segment deviation noted during stress. Blood pressure and heart rate demonstrated a normal response to exercise. Blood pressure demonstrated a normal response to exercise. Overall, the patient's exercise capacity was mildly impaired. Duke Treadmill Score: low risk Negative stress test without evidence of ischemia at given workload.   Neuro/Psych Anxiety negative neurological ROS     GI/Hepatic negative GI ROS, (+)     substance abuse  ,   Endo/Other  diabetesMorbid obesity  Renal/GU negative Renal ROS     Musculoskeletal Gout    Abdominal (+) + obese,   Peds  Hematology HLD   Anesthesia Other Findings family history colon cancer  Reproductive/Obstetrics                            Anesthesia Physical Anesthesia Plan  ASA: IV  Anesthesia Plan: MAC   Post-op Pain Management:    Induction: Intravenous  PONV Risk Score and Plan: 2 and Propofol infusion and Treatment may vary due to age or medical condition  Airway Management Planned: Nasal Cannula  Additional Equipment:   Intra-op Plan:   Post-operative Plan:   Informed Consent: I have reviewed the patients History and Physical, chart, labs and discussed the procedure  including the risks, benefits and alternatives for the proposed anesthesia with the patient or authorized representative who has indicated his/her understanding and acceptance.     Dental advisory given  Plan Discussed with: CRNA  Anesthesia Plan Comments:        Anesthesia Quick Evaluation

## 2018-10-27 NOTE — Op Note (Signed)
[facility] Patient Name: [name] Procedure Date: 10/27/2018 MRN: [id number] Attending MD: Wonda Horner , MD Date of Birth: [birth year] CSN: [id number] Age: 58 Admit Type: Outpatient Procedure:                Colonoscopy Indications:              Colon cancer screening in patient at increased                            risk: Colorectal cancer in father Providers:                Wonda Horner, MD, Zenon Mayo, RN, Elspeth Cho Tech., Technician, Dion Saucier, CRNA Referring MD:              Medicines:                Propofol per Anesthesia Complications:            No immediate complications. Estimated Blood Loss:      Procedure:                Pre-Anesthesia Assessment:                           - Prior to the procedure, a History and Physical                            was performed, and patient medications and                            allergies were reviewed. [name] tolerance of                            previous anesthesia was also reviewed. The risks                            and benefits of the procedure and the sedation                            options and risks were discussed with the patient.                            All questions were answered, and informed consent                            was obtained. Prior Anticoagulants: The patient has                            taken no previous anticoagulant or antiplatelet                            agents. ASA Grade Assessment: III - A patient with  severe systemic disease. After reviewing the risks                            and benefits, the patient was deemed in                            satisfactory condition to undergo the procedure.                           After obtaining informed consent, the colonoscope                            was passed under direct vision. Throughout the                            procedure, the  patient's blood pressure, pulse, and                            oxygen saturations were monitored continuously. The                            PCF-H190DL ([id number]) Olympus pediatric colonscope                            was introduced through the anus and advanced to the                            the cecum, identified by appendiceal orifice and                            ileocecal valve. The ileocecal valve, appendiceal                            orifice, and rectum were photographed. The                            colonoscopy was performed without difficulty. The                            patient tolerated the procedure well. The quality                            of the bowel preparation was good. Scope In: 1:07:05 PM Scope Out: 1:25:06 PM Scope Withdrawal Time: 0 hours 6 minutes 29 seconds  Total Procedure Duration: 0 hours 18 minutes 1 second  Findings:      The perianal and digital rectal examinations were normal.      The colon (entire examined portion) appeared normal. Impression:               - The entire examined colon is normal.                           - No specimens collected. Moderate Sedation:      . Recommendation:           -  Resume regular diet.                           - Continue present medications.                           - Repeat colonoscopy in 5 years for screening                            purposes. Procedure Code(s):        --- Professional ---                           [id number], Colonoscopy, flexible; diagnostic, including                            collection of specimen(s) by brushing or washing,                            when performed (separate procedure) Diagnosis Code(s):        --- Professional ---                           Z80.0, Family history of malignant neoplasm of                            digestive organs CPT copyright 2018 [employer]. All rights reserved. The codes documented in this report are preliminary and upon  coder review may  be revised to meet current compliance requirements. Wonda Horner, MD 10/27/2018 1:31:37 PM This report has been signed electronically. Number of Addenda: 0

## 2018-10-31 ENCOUNTER — Encounter (HOSPITAL_COMMUNITY): Payer: Self-pay | Admitting: Gastroenterology

## 2018-11-09 ENCOUNTER — Ambulatory Visit: Payer: Medicaid Other

## 2018-11-25 ENCOUNTER — Ambulatory Visit
Admission: RE | Admit: 2018-11-25 | Discharge: 2018-11-25 | Disposition: A | Discharge status: Home / Self Care | Payer: Medicaid Other | Source: Ambulatory Visit | Attending: Specialist | Admitting: Specialist

## 2018-11-25 DIAGNOSIS — Z139 Encounter for screening, unspecified: Secondary | ICD-10-CM

## 2018-12-20 ENCOUNTER — Telehealth (INDEPENDENT_AMBULATORY_CARE_PROVIDER_SITE_OTHER): Payer: Self-pay | Admitting: Physical Medicine and Rehabilitation

## 2018-12-20 NOTE — Telephone Encounter (Signed)
ok 

## 2018-12-20 NOTE — Telephone Encounter (Signed)
Patient reports that pain is a little different. Scheduled for an OV.

## 2019-01-10 ENCOUNTER — Ambulatory Visit (INDEPENDENT_AMBULATORY_CARE_PROVIDER_SITE_OTHER): Payer: Medicaid Other | Admitting: Physical Medicine and Rehabilitation

## 2019-01-10 ENCOUNTER — Encounter (INDEPENDENT_AMBULATORY_CARE_PROVIDER_SITE_OTHER): Payer: Self-pay | Admitting: Physical Medicine and Rehabilitation

## 2019-01-10 ENCOUNTER — Telehealth (INDEPENDENT_AMBULATORY_CARE_PROVIDER_SITE_OTHER): Payer: Self-pay | Admitting: Physical Medicine and Rehabilitation

## 2019-01-10 DIAGNOSIS — G8929 Other chronic pain: Secondary | ICD-10-CM

## 2019-01-10 DIAGNOSIS — M48061 Spinal stenosis, lumbar region without neurogenic claudication: Secondary | ICD-10-CM

## 2019-01-10 DIAGNOSIS — M5442 Lumbago with sciatica, left side: Secondary | ICD-10-CM | POA: Diagnosis not present

## 2019-01-10 DIAGNOSIS — M4306 Spondylolysis, lumbar region: Secondary | ICD-10-CM | POA: Diagnosis not present

## 2019-01-10 DIAGNOSIS — M5416 Radiculopathy, lumbar region: Secondary | ICD-10-CM

## 2019-01-10 MED ORDER — DIAZEPAM 5 MG PO TABS: ORAL_TABLET | ORAL | 0 refills | Status: DC

## 2019-01-10 NOTE — Progress Notes (Signed)
Virtual Visit via Video Note  I connected with Elana Alm on 01/10/19 at  9:45 AM EDT by a video enabled telemedicine application and verified that I am speaking with the correct person using two identifiers.   I discussed the limitations of evaluation and management by telemedicine and the availability of in person appointments. The patient expressed understanding and agreed to proceed.  History of Present Illness: The patient is a 58 year old female that me know quite well has we have seen her over the past several years in conjunction with Dr. Joni Fears.  She has a history of lumbar spondylosis with pretty severe facet arthropathy at L4-5 with moderate left foraminal narrowing but no high-grade central stenosis.  Last MRI was from 2018.  History of good relief with epidural injections at L5-S1.  Some relief with facet joint blocks in the past.  We spoke with her today and she has been having several weeks now of worsening low back and left hip and leg pain in the lateral calf and an L5 distribution.  Some paresthesia.  No focal weakness no new trauma.  No bowel or bladder changes.  She feels like her symptoms are little bit different in the sense that it started after she was using a home bicycle or pedaling device for exercise.  She reports increased symptoms and then intermittently got better for a little while the symptoms flared back up.  She is in chronic pain management with Dr. Mirna Mires.  She takes a long-acting morphine with oxycodone breakthrough but it does not seem to help the leg pain when she does get it.  She also takes gabapentin 300 mg 3 times per day.  Again no focal weakness or red flag symptoms.  Last injection was in July 2019.    Observations/Objective:   Patient tells me that she can ambulate but has some difficulty going from sit to stand.  She reports exercise and walking do tend to make it worse but then she does get some relief with rest and activity  modification.  She does try to exercise.  She has not noted any new weakness.   Assessment and Plan:   Exacerbation of chronic lumbar spondylosis with some level of foraminal stenosis and radicular pain that is intermittent with her.  I think if the pain is persistent we can look at a lumbar epidural injection again.  We will go ahead and get this scheduled since we are putting the injections off do to the coronavirus.  If she is doing better at that point we can cancel.  She will continue on her current medications through Dr. Mirna Mires.  She could increase the gabapentin if this was more persistent but it seems to be more intermittent.   Follow Up Instructions:    I discussed the assessment and treatment plan with the patient. The patient was provided an opportunity to ask questions and all were answered. The patient agreed with the plan and demonstrated an understanding of the instructions.   The patient was advised to call back or seek an in-person evaluation if the symptoms worsen or if the condition fails to improve as anticipated.  I provided 25 minutes of non-face-to-face time during this encounter.   Laurence Spates, MD

## 2019-01-10 NOTE — Progress Notes (Signed)
Pain level is 9 out of 10. Standing makes pain worse, difficulty walking. Elevating leg decreases pain.

## 2019-01-10 NOTE — Telephone Encounter (Signed)
done

## 2019-02-14 ENCOUNTER — Encounter (INDEPENDENT_AMBULATORY_CARE_PROVIDER_SITE_OTHER): Payer: Medicaid Other | Admitting: Physical Medicine and Rehabilitation

## 2019-02-27 ENCOUNTER — Ambulatory Visit: Payer: Medicaid Other | Admitting: Physical Medicine and Rehabilitation

## 2019-02-27 ENCOUNTER — Encounter: Payer: Self-pay | Admitting: Physical Medicine and Rehabilitation

## 2019-02-27 ENCOUNTER — Other Ambulatory Visit: Payer: Self-pay

## 2019-02-27 ENCOUNTER — Ambulatory Visit: Payer: Self-pay

## 2019-02-27 VITALS — BP 146/86 | HR 70

## 2019-02-27 DIAGNOSIS — M5416 Radiculopathy, lumbar region: Secondary | ICD-10-CM

## 2019-02-27 MED ORDER — METHYLPREDNISOLONE ACETATE 80 MG/ML IJ SUSP
80.0000 mg | Freq: Once | INTRAMUSCULAR | Status: AC
  Administered 2019-02-27: 80 mg

## 2019-02-27 NOTE — Procedures (Signed)
Lumbar Epidural Steroid Injection - Interlaminar Approach with Fluoroscopic Guidance  Patient: [name]      Date of Birth: 06-27-1961 MRN: [id number] PCP: Javier Docker, MD      Visit Date: 02/27/2019   Universal Protocol:     Consent Given By: the patient  Position: PRONE  Additional Comments: Vital signs were monitored before and after the procedure. Patient was prepped and draped in the usual sterile fashion. The correct patient, procedure, and site was verified.   Injection Procedure Details:  Procedure Site One Meds Administered:  Meds ordered this encounter  Medications  . methylPREDNISolone acetate (DEPO-MEDROL) injection 80 mg     Laterality: Left  Location/Site:  L5-S1  Needle size: 20 G  Needle type: Tuohy  Needle Placement: Paramedian epidural  Findings:   -Comments: Excellent flow of contrast into the epidural space.  Procedure Details: Using a paramedian approach from the side mentioned above, the region overlying the inferior lamina was localized under fluoroscopic visualization and the soft tissues overlying this structure were infiltrated with 4 ml. of 1% Lidocaine without Epinephrine. The Tuohy needle was inserted into the epidural space using a paramedian approach.   The epidural space was localized using loss of resistance along with lateral and bi-planar fluoroscopic views.  After negative aspirate for air, blood, and CSF, a 2 ml. volume of Isovue-250 was injected into the epidural space and the flow of contrast was observed. Radiographs were obtained for documentation purposes.    The injectate was administered into the level noted above.   Additional Comments:  The patient tolerated the procedure well Dressing: 2 x 2 sterile gauze and Band-Aid    Post-procedure details: Patient was observed during the procedure. Post-procedure instructions were reviewed.  Patient left the clinic in stable condition.

## 2019-02-27 NOTE — Progress Notes (Signed)
[name] - 58 y.o. female MRN [id number]  Date of birth: 1960/12/07  Office Visit Note: Visit Date: 02/27/2019 PCP: Javier Docker, MD Referred by: Javier Docker, MD  Subjective: Chief Complaint  Patient presents with  . Lower Back - Pain  . Left Leg - Pain   HPI:  [name] is a 58 y.o. female who comes in today For planned repeat left L5-S1 interlaminar epidural steroid injection for left hip and leg pain.  Patient has chronic pain long-term chronic pain which is unrelieved with any therapy other than chronic opioid treatments for which she sees Dr. Mirna Mires and she takes Morphabond as well as oxycodone for breakthrough.  We see her on an infrequent basis 2-3 times a year for epidural injection which also helps.  Her case is complicated by morbid obesity.  She has had no new changes continued left hip and leg pain.  ROS Otherwise per HPI.  Assessment & Plan: Visit Diagnoses:  1. Lumbar radiculopathy     Plan: No additional findings.   Meds & Orders:  Meds ordered this encounter  Medications  . methylPREDNISolone acetate (DEPO-MEDROL) injection 80 mg    Orders Placed This Encounter  Procedures  . XR C-ARM NO REPORT  . Epidural Steroid injection    Follow-up: Return if symptoms worsen or fail to improve.   Procedures: No procedures performed  Lumbar Epidural Steroid Injection - Interlaminar Approach with Fluoroscopic Guidance  Patient: [name]      Date of Birth: 1961-02-15 MRN: [id number] PCP: Javier Docker, MD      Visit Date: 02/27/2019   Universal Protocol:     Consent Given By: the patient  Position: PRONE  Additional Comments: Vital signs were monitored before and after the procedure. Patient was prepped and draped in the usual sterile fashion. The correct patient, procedure, and site was verified.   Injection Procedure Details:  Procedure Site One Meds Administered:  Meds ordered this encounter   Medications  . methylPREDNISolone acetate (DEPO-MEDROL) injection 80 mg     Laterality: Left  Location/Site:  L5-S1  Needle size: 20 G  Needle type: Tuohy  Needle Placement: Paramedian epidural  Findings:   -Comments: Excellent flow of contrast into the epidural space.  Procedure Details: Using a paramedian approach from the side mentioned above, the region overlying the inferior lamina was localized under fluoroscopic visualization and the soft tissues overlying this structure were infiltrated with 4 ml. of 1% Lidocaine without Epinephrine. The Tuohy needle was inserted into the epidural space using a paramedian approach.   The epidural space was localized using loss of resistance along with lateral and bi-planar fluoroscopic views.  After negative aspirate for air, blood, and CSF, a 2 ml. volume of Isovue-250 was injected into the epidural space and the flow of contrast was observed. Radiographs were obtained for documentation purposes.    The injectate was administered into the level noted above.   Additional Comments:  The patient tolerated the procedure well Dressing: 2 x 2 sterile gauze and Band-Aid    Post-procedure details: Patient was observed during the procedure. Post-procedure instructions were reviewed.  Patient left the clinic in stable condition.   Clinical History: MRI LUMBAR SPINE WITHOUT CONTRAST  TECHNIQUE: Multiplanar, multisequence MR imaging of the lumbar spine was performed. No intravenous contrast was administered.  COMPARISON:  Lumbar spine radiograph July 22, 2017 and MRI of lumbar spine December 11, 2011  FINDINGS: SEGMENTATION: For the purposes of this report,  the last well-formed intervertebral disc will be reported as L5-S1.  ALIGNMENT: Maintained lumbar lordosis. No malalignment.  VERTEBRAE:Vertebral bodies are intact. Intervertebral discs demonstrate normal morphology, mild desiccation lower lumbar discs. RIGHT L4-5 facet  bone marrow edema. Mild chronic discogenic endplate changes T73-U2 and L1-2. No suspicious bone marrow signal.  CONUS MEDULLARIS: Conus medullaris terminates at L1-2 and demonstrates normal morphology and signal characteristics. Cauda equina is normal.  PARASPINAL AND SOFT TISSUES: Included prevertebral and paraspinal soft tissues are nonacute.  DISC LEVELS:  T12-L1: Small broad-based disc bulge without canal stenosis or neural foraminal narrowing.  L1-2: Small broad-based disc bulge asymmetric the RIGHT. No canal stenosis. Mild RIGHT neural foraminal narrowing.  L2-3: No disc bulge, canal stenosis nor neural foraminal narrowing. Mild to moderate facet arthropathy and ligamentum flavum redundancy.  L3-4: No disc bulge, canal stenosis nor neural foraminal narrowing. Moderate facet arthropathy and ligamentum flavum redundancy  L4-5: Annular bulging, RIGHT annular fissure. Moderate to severe facet arthropathy and ligamentum flavum redundancy without canal stenosis. Mild widening of the LEFT facet with effusion. Mild bilateral neural foraminal narrowing.  L5-S1: Small broad-based disc bulge. Mild RIGHT, moderate to severe LEFT facet arthropathy without canal stenosis. Moderate LEFT neural foraminal narrowing.  IMPRESSION: 1. Progressed degenerative change of the lower lumbar spine with superimposed RIGHT L4-5 facet acute reactive changes. 2. No canal stenosis. L4-5 L5-S1 neural foraminal narrowing: Moderate on the LEFT at L5-S1.   Electronically Signed   By: Elon Alas M.D.   On: 07/28/2017 19:55     Objective:  VS:  HT:    WT:   BMI:     BP:(!) 146/86  HR:70bpm  TEMP: ( )  RESP:  Physical Exam  Ortho Exam Imaging: Xr C-arm No Report  Result Date: 02/27/2019 Please see Notes tab for imaging impression.

## 2019-02-27 NOTE — Progress Notes (Signed)
 .  Numeric Pain Rating Scale and Functional Assessment Average Pain 7   In the last MONTH (on 0-10 scale) has pain interfered with the following?  1. General activity like being  able to carry out your everyday physical activities such as walking, climbing stairs, carrying groceries, or moving a chair?  Rating(5)   +Driver, -BT, -Dye Allergies.  

## 2019-10-19 ENCOUNTER — Other Ambulatory Visit: Payer: Self-pay | Admitting: Specialist

## 2019-10-19 DIAGNOSIS — Z1231 Encounter for screening mammogram for malignant neoplasm of breast: Secondary | ICD-10-CM

## 2019-11-27 ENCOUNTER — Ambulatory Visit
Admission: RE | Admit: 2019-11-27 | Discharge: 2019-11-27 | Disposition: A | Discharge status: Home / Self Care | Payer: Medicaid Other | Source: Ambulatory Visit | Attending: Specialist | Admitting: Specialist

## 2019-11-27 ENCOUNTER — Other Ambulatory Visit: Payer: Self-pay

## 2019-11-27 DIAGNOSIS — Z1231 Encounter for screening mammogram for malignant neoplasm of breast: Secondary | ICD-10-CM

## 2020-02-21 ENCOUNTER — Other Ambulatory Visit (HOSPITAL_COMMUNITY): Payer: Self-pay | Admitting: Internal Medicine

## 2020-02-21 DIAGNOSIS — R079 Chest pain, unspecified: Secondary | ICD-10-CM

## 2020-03-14 ENCOUNTER — Telehealth (HOSPITAL_COMMUNITY): Payer: Self-pay

## 2020-03-14 NOTE — Telephone Encounter (Signed)
Attempted to contact the patient with instructions for her stress test. Mailbox was full. S.Chakia Counts EMTP

## 2020-03-19 ENCOUNTER — Ambulatory Visit (HOSPITAL_COMMUNITY): Payer: Medicaid Other | Attending: Cardiology

## 2020-03-19 ENCOUNTER — Other Ambulatory Visit: Payer: Self-pay

## 2020-03-19 DIAGNOSIS — R079 Chest pain, unspecified: Secondary | ICD-10-CM | POA: Diagnosis present

## 2020-03-19 MED ORDER — TECHNETIUM TC 99M TETROFOSMIN IV KIT
32.6000 | PACK | Freq: Once | INTRAVENOUS | Status: AC | PRN
  Administered 2020-03-19: 32.6 via INTRAVENOUS
  Filled 2020-03-19: qty 33

## 2020-03-19 MED ORDER — REGADENOSON 0.4 MG/5ML IV SOLN
0.4000 mg | Freq: Once | INTRAVENOUS | Status: AC
  Administered 2020-03-19: 0.4 mg via INTRAVENOUS

## 2020-03-20 ENCOUNTER — Ambulatory Visit (HOSPITAL_COMMUNITY): Payer: Medicaid Other | Attending: Cardiology

## 2020-03-20 LAB — MYOCARDIAL PERFUSION IMAGING
LV dias vol: 108 mL (ref 46–106)
LV sys vol: 38 mL
Peak HR: 83 {beats}/min
Rest HR: 63 {beats}/min
SDS: 0
SRS: 0
SSS: 0
TID: 0.98

## 2020-03-20 MED ORDER — TECHNETIUM TC 99M TETROFOSMIN IV KIT
31.5000 | PACK | Freq: Once | INTRAVENOUS | Status: AC | PRN
  Administered 2020-03-20: 31.5 via INTRAVENOUS
  Filled 2020-03-20: qty 32

## 2020-09-16 NOTE — Progress Notes (Signed)
Office Visit Note  Patient: [name]             Date of Birth: June 12, 1961           MRN: [id number]             PCP: Riki Sheer, NP Referring: Riki Sheer, NP Visit Date: 09/20/2020 Occupation: @GUAROCC @  Subjective:  New Patient (Initial Visit) (Abnormal labs)   History of Present Illness: [name] is a 59 y.o. female consultation per request of her PCP.  According to the patient she has had history of disc disease of lumbar spine for many years.  She was under care of Dr. Durward Fortes and Dr. Ernestina Patches.  She has had radiofrequency ablation and injections by Dr. Ernestina Patches in the past.  She states she has also had knee joint discomfort for many years and her knee joints buckle at times.  She has had bilateral rotator cuff tear repair by Dr. Durward Fortes.  She was also diagnosed with bilateral carpal tunnel syndrome.  She states she has been having burning sensation in her bilateral thigh more prominent on the right than the left lower extremity.  The pain has been persistent for the last 3 months.  She states her PCP did some blood work and her ANA and double-stranded DNA came positive for that reason she was referred to me.  She denies any history of oral ulcers, nasal ulcers, sicca symptoms, Raynaud's phenomenon, malar rash, photosensitivity, lymphadenopathy.  She gives history of hair loss for the last few months.  There is no family history of autoimmune disease.  She is gravida six para 6.  He has history of DVTs in the past.  Activities of Daily Living:  Patient reports morning stiffness for 30 minutes.   Patient Reports nocturnal pain.  Difficulty dressing/grooming: Denies Difficulty climbing stairs: Reports Difficulty getting out of chair: Reports Difficulty using hands for taps, buttons, cutlery, and/or writing: Reports  Review of Systems  Constitutional: Negative for fatigue, night sweats, weight gain and weight loss.  HENT: Positive for mouth  dryness. Negative for mouth sores, trouble swallowing, trouble swallowing and nose dryness.   Eyes: Negative for pain, redness, visual disturbance and dryness.  Respiratory: Negative for cough, shortness of breath and difficulty breathing.   Cardiovascular: Negative for chest pain, palpitations, hypertension, irregular heartbeat and swelling in legs/feet.  Gastrointestinal: Positive for constipation. Negative for blood in stool and diarrhea.  Endocrine: Negative for increased urination.  Genitourinary: Negative for difficulty urinating and vaginal dryness.  Musculoskeletal: Positive for arthralgias, gait problem, joint pain, morning stiffness and muscle tenderness. Negative for joint swelling, myalgias, muscle weakness and myalgias.       Uses a cane  Skin: Positive for hair loss. Negative for color change, rash, skin tightness, ulcers and sensitivity to sunlight.  Allergic/Immunologic: Negative for susceptible to infections.  Neurological: Positive for numbness. Negative for dizziness, memory loss, night sweats and weakness.  Hematological: Negative for bruising/bleeding tendency and swollen glands.  Psychiatric/Behavioral: Positive for sleep disturbance. Negative for depressed mood. The patient is not nervous/anxious.     PMFS History:  Patient Active Problem List   Diagnosis Date Noted  . Chronic left-sided low back pain with left-sided sciatica 07/22/2017  . Thyroid nodule 04/16/2017  . Essential hypertension 03/13/2014  . Menometrorrhagia   . Dysmenorrhea   . Anemia   . Hyperlipidemia 05/23/2007  . OBESITY, MORBID 05/23/2007  . DYSFUNCTIONAL UTERINE BLEEDING - s/p LAVH 05/23/2007  . ROTATOR CUFF SYNDROME  05/23/2007  . KNEE PAIN, HX OF 05/23/2007    Past Medical History:  Diagnosis Date  . Anemia   . Anxiety   . Arthritis   . Diabetes mellitus without complication ([place])    recent dx diet controlled  . DVT (deep venous thrombosis) (HCC)    remote - took coumadin  .  Dysmenorrhea   . Dysrhythmia    irregular heart beat  . Fibroid   . GERD (gastroesophageal reflux disease)    hx of  . Gout   . Hypercholesteremia   . Hypertension 4 months  . Menometrorrhagia   . Morbid obesity ([place])   . Prediabetes   . Substance abuse ([place])     Family History  Problem Relation Age of Onset  . Diabetes Mother   . Hypertension Mother   . Diabetes Father   . Hypertension Father   . Cancer Father 104  . Heart disease Other        No family history  . Cancer Brother 33   Past Surgical History:  Procedure Laterality Date  . ABDOMINAL HYSTERECTOMY  2008  . BREAST BIOPSY Left 2013   benign  . BUNIONECTOMY Bilateral   . CHOLECYSTECTOMY    . COLONOSCOPY WITH PROPOFOL N/A 10/27/2018   Procedure: COLONOSCOPY WITH PROPOFOL;  Surgeon: Wonda Horner, MD;  Location: [facility];  Service: Endoscopy;  Laterality: N/A;  . ECTOPIC PREGNANCY SURGERY  1980's  . MANDIBLE OSTEOTOMY Bilateral 07/03/2013   Procedure: BILATERAL TORI;  Surgeon: Gae Bon, DDS;  Location: [facility];  Service: Oral Surgery;  Laterality: Bilateral;  . NM MYOCAR PERF WALL MOTION  07/2012   lexiscan - normal pattern of perfusion, low risk  . ROTATOR CUFF REPAIR Bilateral 2009  . SLEEP STUDY  07/27/2012   AHI 4.8/hr  . TOOTH EXTRACTION N/A 07/03/2013   Procedure: DENTAL EXTRACTION # 20;  Surgeon: Gae Bon, DDS;  Location: [facility];  Service: Oral Surgery;  Laterality: N/A;  . TRANSTHORACIC ECHOCARDIOGRAM  07/2012   EF=>55%, mod conc LVH; LA mod dilated; trace MR; mild TR with normal RVSP; trace AV regurg  . TUBAL LIGATION     Social History   Social History Narrative  . Not on file   Immunization History  Administered Date(s) Administered  . PFIZER SARS-COV-2 Vaccination 05/13/2020, 05/24/2020     Objective: Vital Signs: BP 100/65 (BP Location: Right Arm, Patient Position: Sitting, Cuff Size: Normal)   Pulse 63   Resp 18   Ht 5' 6.5" (1.689 m)   Wt 298 lb (135.2 kg)   BMI 47.38  kg/m    Physical Exam Vitals and nursing note reviewed.  Constitutional:      Appearance: She is well-developed and well-nourished.  HENT:     Head: Normocephalic and atraumatic.  Eyes:     Extraocular Movements: EOM normal.     Conjunctiva/sclera: Conjunctivae normal.  Cardiovascular:     Rate and Rhythm: Normal rate and regular rhythm.     Pulses: Intact distal pulses.     Heart sounds: Normal heart sounds.  Pulmonary:     Effort: Pulmonary effort is normal.     Breath sounds: Normal breath sounds.  Abdominal:     General: Bowel sounds are normal.     Palpations: Abdomen is soft.  Musculoskeletal:     Cervical back: Normal range of motion.  Lymphadenopathy:     Cervical: No cervical adenopathy.  Skin:    General: Skin is warm and dry.  Capillary Refill: Capillary refill takes less than 2 seconds.  Neurological:     Mental Status: She is alert and oriented to person, place, and time.  Psychiatric:        Mood and Affect: Mood and affect normal.        Behavior: Behavior normal.      Musculoskeletal Exam: 1 was in good range of motion.  She has discomfort in the lower lumbar region. Shoulder joints, elbow joints, wrist joints, MCPs, PIPs and DIPs with good range of motion with no synovitis.  She had painful range of motion of bilateral hips especially the right hip joint.  She has painful range of motion of bilateral knee joints without any warmth swelling or effusion.  She had no tenderness over ankle joints.  She has bilateral bunionectomy scar.  No synovitis was noted.  CDAI Exam: CDAI Score: -- Patient Global: --; Provider Global: -- Swollen: --; Tender: -- Joint Exam 09/20/2020   No joint exam has been documented for this visit   There is currently no information documented on the homunculus. Go to the Rheumatology activity and complete the homunculus joint exam.  Investigation: No additional findings.  Imaging: No results found.  Recent Labs: Lab  Results  Component Value Date   WBC 7.9 06/29/2013   HGB 13.9 12/26/2017   PLT 347 06/29/2013   NA 143 12/26/2017   K 4.1 12/26/2017   CL 104 12/26/2017   CO2 31 06/29/2013   GLUCOSE 115 (H) 12/26/2017   BUN 5 (L) 12/26/2017   CREATININE 0.70 12/26/2017   BILITOT 0.4 06/29/2013   ALKPHOS 77 06/29/2013   AST 23 06/29/2013   ALT 25 06/29/2013   PROT 8.4 (H) 06/29/2013   ALBUMIN 4.0 06/29/2013   CALCIUM 9.4 06/29/2013   GFRAA >90 06/29/2013    Speciality Comments: No specialty comments available.  Procedures:  No procedures performed Allergies: Buprenorphine hcl-naloxone hcl, Naloxone, and Dilaudid [hydromorphone hcl]   Assessment / Plan:     Visit Diagnoses: Positive ANA (antinuclear antibody) - 07/31/20: ANA+, dsDNA 18, RNP 0.2, smith-, Ro-, La-02/13/20: TSH 4.006, T3 103.8, TPO-, vitamin D 23.44, Mg 1.80, Hep C-, RPR reactive, PTH 64.5, HSV1+, HSV2+, patient had recent labs done by her PCP which showed positive ANA and positive double-stranded DNA.  No ANA titer was given.  She has no history of oral ulcers, nasal ulcers, sicca symptoms, Raynaud's phenomenon, lymphadenopathy, malar rash, photosensitivity.  She gives history of hair loss for the last few months.  She had no synovitis on my examination today.  I will obtainAVISE labs.  She also gives history of DVTs in the past.  There is no family history of autoimmune disease.  Bilateral carpal tunnel syndrome-patient states the symptoms were more prominent when she used to work is not being as bothersome recently.  Chronic pain of both hips-she had been seeing Dr. Durward Fortes and had x-rays in the past.  She had painful range of motion of her hip joints today.  I offered x-rays but she would like to see Dr. Durward Fortes for a follow-up visit.  Chronic pain of both knees-she continues to have pain and discomfort in her knee joints.  She states her knee joints buckled on her at times.  She has been using a cane sometimes for stability.   She will schedule appointment with Dr. Durward Fortes.  History of bilateral bunionectomy  DDD (degenerative disc disease), lumbar-she has seen Dr. Durward Fortes and Dr. Ernestina Patches in the past.  She states she  has had injections in her lumbar spine in the past and also radiofrequency ablation.  Recently she has been experiencing paresthesias on the right thigh region.  She would benefit from evaluation by Dr. Durward Fortes and Dr. Ernestina Patches.  She will schedule that appointment.  Chronic pain syndrome - Pain management at [facility]  History of DVT (deep vein thrombosis) - Approximately in 2015 per patient bilateral lower extremities.  Treated with warfarin for 1 year.  Essential hypertension-her blood pressure is normal today.  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  No orders of the defined types were placed in this encounter.   Follow-Up Instructions: Return for ANA.   Bo Merino, MD  Note - This record has been created using Editor, commissioning.  Chart creation errors have been sought, but may not always  have been located. Such creation errors do not reflect on  the standard of medical care.

## 2020-09-20 ENCOUNTER — Encounter: Payer: Self-pay | Admitting: Rheumatology

## 2020-09-20 ENCOUNTER — Other Ambulatory Visit: Payer: Self-pay

## 2020-09-20 ENCOUNTER — Ambulatory Visit: Payer: Medicaid Other | Admitting: Rheumatology

## 2020-09-20 VITALS — BP 100/65 | HR 63 | Resp 18 | Ht 66.5 in | Wt 298.0 lb

## 2020-09-20 DIAGNOSIS — Z86718 Personal history of other venous thrombosis and embolism: Secondary | ICD-10-CM

## 2020-09-20 DIAGNOSIS — M5136 Other intervertebral disc degeneration, lumbar region: Secondary | ICD-10-CM

## 2020-09-20 DIAGNOSIS — G5603 Carpal tunnel syndrome, bilateral upper limbs: Secondary | ICD-10-CM | POA: Diagnosis not present

## 2020-09-20 DIAGNOSIS — M2550 Pain in unspecified joint: Secondary | ICD-10-CM

## 2020-09-20 DIAGNOSIS — L658 Other specified nonscarring hair loss: Secondary | ICD-10-CM

## 2020-09-20 DIAGNOSIS — R768 Other specified abnormal immunological findings in serum: Secondary | ICD-10-CM

## 2020-09-20 DIAGNOSIS — G8929 Other chronic pain: Secondary | ICD-10-CM

## 2020-09-20 DIAGNOSIS — I10 Essential (primary) hypertension: Secondary | ICD-10-CM

## 2020-09-20 DIAGNOSIS — M25551 Pain in right hip: Secondary | ICD-10-CM

## 2020-09-20 DIAGNOSIS — M25552 Pain in left hip: Secondary | ICD-10-CM

## 2020-09-20 DIAGNOSIS — M25561 Pain in right knee: Secondary | ICD-10-CM

## 2020-09-20 DIAGNOSIS — E041 Nontoxic single thyroid nodule: Secondary | ICD-10-CM

## 2020-09-20 DIAGNOSIS — G894 Chronic pain syndrome: Secondary | ICD-10-CM

## 2020-09-20 DIAGNOSIS — K760 Fatty (change of) liver, not elsewhere classified: Secondary | ICD-10-CM

## 2020-09-20 DIAGNOSIS — M25562 Pain in left knee: Secondary | ICD-10-CM

## 2020-09-20 DIAGNOSIS — Z8639 Personal history of other endocrine, nutritional and metabolic disease: Secondary | ICD-10-CM

## 2020-09-20 DIAGNOSIS — Z862 Personal history of diseases of the blood and blood-forming organs and certain disorders involving the immune mechanism: Secondary | ICD-10-CM

## 2020-09-20 NOTE — Patient Instructions (Signed)
   Please a schedule appointment with Dr. Durward Fortes for hip pain, knee pain and lower back pain

## 2020-09-27 ENCOUNTER — Other Ambulatory Visit: Payer: Self-pay

## 2020-09-27 ENCOUNTER — Ambulatory Visit: Payer: Medicaid Other | Attending: Nurse Practitioner

## 2020-09-27 DIAGNOSIS — M5386 Other specified dorsopathies, lumbar region: Secondary | ICD-10-CM | POA: Insufficient documentation

## 2020-09-27 DIAGNOSIS — M6281 Muscle weakness (generalized): Secondary | ICD-10-CM | POA: Diagnosis present

## 2020-09-27 DIAGNOSIS — M5416 Radiculopathy, lumbar region: Secondary | ICD-10-CM | POA: Insufficient documentation

## 2020-09-28 NOTE — Therapy (Signed)
[place], [place], [address] Phone: [phone]   Fax:  [phone]  Physical Therapy Evaluation  Patient Details  Name: [name] MRN: [id number] Date of Birth: [birth year] Referring Provider (PT): Riki Sheer, NP   Encounter Date: 09/27/2020    Past Medical History:  Diagnosis Date  . Anemia   . Anxiety   . Arthritis   . Diabetes mellitus without complication ([place])    recent dx diet controlled  . DVT (deep venous thrombosis) (HCC)    remote - took coumadin  . Dysmenorrhea   . Dysrhythmia    irregular heart beat  . Fibroid   . GERD (gastroesophageal reflux disease)    hx of  . Gout   . Hypercholesteremia   . Hypertension 4 months  . Menometrorrhagia   . Morbid obesity ([place])   . Prediabetes   . Substance abuse [facility])     Past Surgical History:  Procedure Laterality Date  . ABDOMINAL HYSTERECTOMY  2008  . BREAST BIOPSY Left 2013   benign  . BUNIONECTOMY Bilateral   . CHOLECYSTECTOMY    . COLONOSCOPY WITH PROPOFOL N/A 10/27/2018   Procedure: COLONOSCOPY WITH PROPOFOL;  Surgeon: Wonda Horner, MD;  [facility];  Service: Endoscopy;  Laterality: N/A;  . ECTOPIC PREGNANCY SURGERY  1980's  . MANDIBLE OSTEOTOMY Bilateral 07/03/2013   Procedure: BILATERAL TORI;  Surgeon: Gae Bon, DDS;  Location: [facility];  Service: Oral Surgery;  Laterality: Bilateral;  . NM MYOCAR PERF WALL MOTION  07/2012   lexiscan - normal pattern of perfusion, low risk  . ROTATOR CUFF REPAIR Bilateral 2009  . SLEEP STUDY  07/27/2012   AHI 4.8/hr  . TOOTH EXTRACTION N/A 07/03/2013   Procedure: DENTAL EXTRACTION # 20;  Surgeon: Gae Bon, DDS;  Location: [facility];  Service: Oral Surgery;  Laterality: N/A;  . TRANSTHORACIC ECHOCARDIOGRAM  07/2012   EF=>55%, mod conc LVH; LA mod dilated; trace MR; mild TR with normal RVSP; trace AV regurg  . TUBAL LIGATION      There were no vitals filed  for this visit.        [facility] PT Assessment - 09/28/20 0001      Assessment   Medical Diagnosis Radiculopathy, lumbar region    Referring Provider (PT) Abran Cantor, Nena Alexander, NP    Onset Date/Surgical Date --   4 to 5 years ago   Hand Dominance Right    Next MD Visit 11/01/20      Precautions   Precautions None      Restrictions   Weight Bearing Restrictions No      Balance Screen   Has the patient fallen in the past 6 months No      [place]    Living Arrangements Spouse/significant other    Type of [place] to enter    Entrance Stairs-Number of Steps 3    [place] One level    [place] - single point;Shower seat      Prior Function   Level of Independence Independent    Vocation On disability      Cognition   Overall Cognitive Status Within Functional Limits for tasks assessed      Observation/Other Assessments   Focus on Therapeutic Outcomes (FOTO)  NA      Sensation   Light Touch Appears  Intact      Posture/Postural Control   Posture/Postural Control Postural limitations    Postural Limitations Increased lumbar lordosis;Decreased thoracic kyphosis   Lordosis is significant     ROM / Strength   AROM / PROM / Strength AROM;Strength      AROM   AROM Assessment Site Lumbar    Lumbar Flexion 25% limitation   pain with return from flexion   Lumbar Extension 25% limtation   increase in pain   Lumbar - Right Side Bend 50% limtation   most increase in pain   Lumbar - Left Side Bend 50% limtation   Most increase in pain   Lumbar - Right Rotation Full ROM    Lumbar - Left Rotation Full ROM      Strength   Overall Strength Comments :LE myotomal screen is neg.      Transfers   Transfers Sit to Stand;Stand to Sit    Sit to Stand 7: Independent      Ambulation/Gait   Ambulation/Gait Yes    Ambulation/Gait Assistance 7: Independent     Gait Pattern Within Functional Limits;Step-to pattern                      Objective measurements completed on examination: See above findings.               PT Education - 09/28/20 2143    Education Details Eval findings, POC, HEP, a neutral spine posture for sitting and sleeping for comfort    Person(s) Educated Patient    Methods Explanation;Demonstration;Tactile cues;Verbal cues;Handout    Comprehension Verbalized understanding;Returned demonstration;Verbal cues required;Tactile cues required;Need further instruction            PT Short Term Goals - 09/28/20 2210      PT SHORT TERM GOAL #1   Title Pt will be Ind in an Initial HEP    Baseline Started on eval    Status New    Target Date 10/19/20      PT SHORT TERM GOAL #2   Title Pt will voice understanding of measures to assist with the reduction of low back and L LE pain.    Status New    Target Date 10/19/20             PT Long Term Goals - 09/28/20 2212      PT LONG TERM GOAL #1   Title Pt will be Ind in a final HEP to maintain or progress achieved level of function.    Target Date 12/07/20      PT LONG TERM GOAL #2   Title Pt will report improvement in her low back and L LE pain consistent to 5 to less with daily activites    Baseline 4-9/10    Status New    Target Date 12/07/20      PT LONG TERM GOAL #3   Title Pt's SB ROM  L and R will improve to a 25% limitation    Baseline 50% limitation    Status New    Target Date 12/07/20      PT LONG TERM GOAL #4   Title Pt will report tolerating walking 45 mins with low back and L LE pain not exceeding 5/10 to assist pt in exercise and continued weight loss.    Status New    Target Date 12/07/20                  Plan -  09/28/20 2156    Clinical Impression Statement With today's evaluation, pt responded favorably to trunk flexion and abdominal strengthening. Trunk extension activities wrosened pt's pain. Eval findings are  consistent with Lumbar X-ray. Pt will benefit from PT 2w8 for flexibility exs, trunk/core strengthening exs, back care education, modalities, and manual techniques to decrease pain and maximize functional activities.    Personal Factors and Comorbidities Time since onset of injury/illness/exacerbation;Comorbidity 2;Comorbidity 1;Comorbidity 3+;Fitness    Comorbidities obesity, DM, anxiety, substance abuse    Examination-Activity Limitations Locomotion Level;Stand;Sleep;Sit    Stability/Clinical Decision Making Evolving/Moderate complexity    Clinical Decision Making Moderate    Rehab Potential Good    PT Frequency 2x / week    PT Duration 8 weeks    PT Treatment/Interventions ADLs/Self Care Home Management;Cryotherapy;Electrical Stimulation;Iontophoresis 4mg /ml Dexamethasone;Moist Heat;Traction;Ultrasound;Therapeutic activities;Therapeutic exercise;Patient/family education;Passive range of motion;Dry needling;Taping;Joint Manipulations    PT Next Visit Plan Assess response to HEP and education. Add back/core strengthening exs as indicated.    PT Home Exercise Plan [id number]    Consulted and Agree with Plan of Care Patient           Patient will benefit from skilled therapeutic intervention in order to improve the following deficits and impairments:  Decreased range of motion,Obesity,Postural dysfunction,Decreased strength,Improper body mechanics,Pain,Decreased activity tolerance  Visit Diagnosis: Radiculopathy, lumbar region  Muscle weakness (generalized)  Decreased ROM of lumbar spine     Problem List Patient Active Problem List   Diagnosis Date Noted  . Chronic left-sided low back pain with left-sided sciatica 07/22/2017  . Thyroid nodule 04/16/2017  . Essential hypertension 03/13/2014  . Menometrorrhagia   . Dysmenorrhea   . Anemia   . Hyperlipidemia 05/23/2007  . OBESITY, MORBID 05/23/2007  . DYSFUNCTIONAL UTERINE BLEEDING - s/p LAVH 05/23/2007  . ROTATOR CUFF SYNDROME  05/23/2007  . KNEE PAIN, HX OF 05/23/2007    Gar Ponto MS, PT 09/28/20 10:27 PM  [facility] [address] [place], [place], [address] Phone: [phone]   Fax:  [phone]  Name: [name] MRN: [id number] Date of Birth: Sep 23, 1961

## 2020-10-16 ENCOUNTER — Other Ambulatory Visit: Payer: Self-pay

## 2020-10-16 ENCOUNTER — Ambulatory Visit: Payer: Medicaid Other | Attending: Nurse Practitioner

## 2020-10-16 DIAGNOSIS — M5386 Other specified dorsopathies, lumbar region: Secondary | ICD-10-CM | POA: Diagnosis present

## 2020-10-16 DIAGNOSIS — M5416 Radiculopathy, lumbar region: Secondary | ICD-10-CM | POA: Diagnosis not present

## 2020-10-16 DIAGNOSIS — M6281 Muscle weakness (generalized): Secondary | ICD-10-CM

## 2020-10-16 NOTE — Patient Instructions (Signed)
LTR x 5 RT /Lt 5 sec hold. PPT x 10 5 sec hold   Knee fall outs x 10 Rt /LT  With PPT    All 2x/day

## 2020-10-16 NOTE — Therapy (Signed)
[place], [place], [address] Phone: [phone]   Fax:  [phone]  Physical Therapy Treatment  Patient Details  Name: [name] MRN: [id number] Date of Birth: [birth year] Referring Provider (PT): Riki Sheer, NP   Encounter Date: 10/16/2020   PT End of Session - 10/16/20 0912    Visit Number 2    Number of Visits 17    Date for PT Re-Evaluation 12/07/20    Authorization Type [place] MEDICAID [employer]    Authorization - Visit Number 2    Authorization - Number of Visits 27    PT Start Time 0915    PT Stop Time 1000    PT Time Calculation (min) 45 min    Activity Tolerance Patient tolerated treatment well    Behavior During Therapy [facility] for tasks assessed/performed           Past Medical History:  Diagnosis Date  . Anemia   . Anxiety   . Arthritis   . Diabetes mellitus without complication ([place])    recent dx diet controlled  . DVT (deep venous thrombosis) (HCC)    remote - took coumadin  . Dysmenorrhea   . Dysrhythmia    irregular heart beat  . Fibroid   . GERD (gastroesophageal reflux disease)    hx of  . Gout   . Hypercholesteremia   . Hypertension 4 months  . Menometrorrhagia   . Morbid obesity ([place])   . Prediabetes   . Substance abuse [facility])     Past Surgical History:  Procedure Laterality Date  . ABDOMINAL HYSTERECTOMY  2008  . BREAST BIOPSY Left 2013   benign  . BUNIONECTOMY Bilateral   . CHOLECYSTECTOMY    . COLONOSCOPY WITH PROPOFOL N/A 10/27/2018   Procedure: COLONOSCOPY WITH PROPOFOL;  Surgeon: Wonda Horner, MD;  [facility];  Service: Endoscopy;  Laterality: N/A;  . ECTOPIC PREGNANCY SURGERY  1980's  . MANDIBLE OSTEOTOMY Bilateral 07/03/2013   Procedure: BILATERAL TORI;  Surgeon: Gae Bon, DDS;  Location: [place];  Service: Oral Surgery;  Laterality: Bilateral;  . NM MYOCAR PERF WALL MOTION  07/2012   lexiscan - normal  pattern of perfusion, low risk  . ROTATOR CUFF REPAIR Bilateral 2009  . SLEEP STUDY  07/27/2012   AHI 4.8/hr  . TOOTH EXTRACTION N/A 07/03/2013   Procedure: DENTAL EXTRACTION # 20;  Surgeon: Gae Bon, DDS;  Location: [place];  Service: Oral Surgery;  Laterality: N/A;  . TRANSTHORACIC ECHOCARDIOGRAM  07/2012   EF=>55%, mod conc LVH; LA mod dilated; trace MR; mild TR with normal RVSP; trace AV regurg  . TUBAL LIGATION      There were no vitals filed for this visit.   Subjective Assessment - 10/16/20 0917    Subjective She reports waking with tenderness /pain on LT side.     Sleeping  in raised position on LT side.  nothing different. May be weather related with incr bafk pain. She  did her HEP 3x/day/    No decr pain with HEP.    Pain Score 7     Pain Location Back    Pain Orientation Left    Pain Descriptors / Indicators Throbbing;Aching;Burning    Pain Onset More than a month ago    Pain Frequency Constant    Aggravating Factors  being on feet.    Pain Relieving Factors meds  [facility] Adult PT Treatment/Exercise - 10/16/20 0001      Exercises   Exercises Lumbar      Lumbar Exercises: Stretches   Lower Trunk Rotation 5 reps;10 seconds    Lower Trunk Rotation Limitations RT/LT 1 set with feet apart and 1 with feet together.    Pelvic Tilt 10 reps;5 seconds    Other Lumbar Stretch Exercise seated flexion x 10      Lumbar Exercises: Supine   Clam Limitations single leg fall out with PPT  cued for breathing x5 RT/LT    Bent Knee Raise 5 reps    Bent Knee Raise Limitations RT/LT with PPT    Other Supine Lumbar Exercises head and shoulder raises x 10      Manual Therapy   Manual Therapy Passive ROM    Passive ROM SKC, hip adduction x 2 each 30 sec  in supine.                  PT Education - 10/16/20 1001    Education Details HEP    Person(s) Educated Patient    Methods Explanation;Tactile cues;Verbal cues;Handout     Comprehension Verbalized understanding;Returned demonstration            PT Short Term Goals - 09/28/20 2210      PT SHORT TERM GOAL #1   Title Pt will be Ind in an Initial HEP    Baseline Started on eval    Status New    Target Date 10/19/20      PT SHORT TERM GOAL #2   Title Pt will voice understanding of measures to assist with the reduction of low back and L LE pain.    Status New    Target Date 10/19/20             PT Long Term Goals - 09/28/20 2212      PT LONG TERM GOAL #1   Title Pt will be Ind in a final HEP to maintain or progress achieved level of function.    Target Date 12/07/20      PT LONG TERM GOAL #2   Title Pt will report improvement in her low back and L LE pain consistent to 5 to less with daily activites    Baseline 4-9/10    Status New    Target Date 12/07/20      PT LONG TERM GOAL #3   Title Pt's SB ROM  L and R will improve to a 25% limitation    Baseline 50% limitation    Status New    Target Date 12/07/20      PT LONG TERM GOAL #4   Title Pt will report tolerating walking 45 mins with low back and L LE pain not exceeding 5/10 to assist pt in exercise and continued weight loss.    Status New    Target Date 12/07/20                 Plan - 10/16/20 0913    Clinical Impression Statement She reports she felt some better post and flexion exercises generally did not cause incr pain. LT flexnk pain new but may be from sleeping position and may resolve.  Added some new HEp to plan    PT Treatment/Interventions ADLs/Self Care Home Management;Cryotherapy;Electrical Stimulation;Iontophoresis 4mg /ml Dexamethasone;Moist Heat;Traction;Ultrasound;Therapeutic activities;Therapeutic exercise;Patient/family education;Passive range of motion;Dry needling;Taping;Joint Manipulations    PT Next Visit Plan Assess response to HEP and education. Add back/core strengthening exs as indicated.  PT Home Exercise Plan sitting flexion , supine head and shoulder  raises.    Consulted and Agree with Plan of Care Patient           Patient will benefit from skilled therapeutic intervention in order to improve the following deficits and impairments:  Decreased range of motion,Obesity,Postural dysfunction,Decreased strength,Improper body mechanics,Pain,Decreased activity tolerance  Visit Diagnosis: Radiculopathy, lumbar region  Muscle weakness (generalized)  Decreased ROM of lumbar spine     Problem List Patient Active Problem List   Diagnosis Date Noted  . Chronic left-sided low back pain with left-sided sciatica 07/22/2017  . Thyroid nodule 04/16/2017  . Essential hypertension 03/13/2014  . Menometrorrhagia   . Dysmenorrhea   . Anemia   . Hyperlipidemia 05/23/2007  . OBESITY, MORBID 05/23/2007  . DYSFUNCTIONAL UTERINE BLEEDING - s/p LAVH 05/23/2007  . ROTATOR CUFF SYNDROME 05/23/2007  . KNEE PAIN, HX OF 05/23/2007    Darrel Hoover  PT 10/16/2020, 10:02 AM  [facility] [address] [place], [place], [address] Phone: [phone]   Fax:  [phone]  Name: [name] MRN: [id number] Date of Birth: 05/25/1961

## 2020-10-18 ENCOUNTER — Ambulatory Visit: Payer: Medicaid Other

## 2020-10-21 NOTE — Progress Notes (Signed)
Office Visit Note  Patient: [name]             Date of Birth: 04-28-1961           MRN: [id number]             PCP: Riki Sheer, NP Referring: Riki Sheer, NP Visit Date: 11/04/2020 Occupation: @GUAROCC @  Subjective:  Positive ANA.   History of Present Illness: [name] is a 60 y.o. female was seen recently for evaluation of positive ANA.  She denies any history of oral ulcers, nasal ulcers, malar rash, sicca symptoms, Raynaud's phenomenon, lymphadenopathy or inflammatory arthritis.  She continues to have pain in multiple joints including her knee joints, hip joints and lower back.  Activities of Daily Living:  Patient reports morning stiffness for 10-15 minutes.   Patient Reports nocturnal pain.  Difficulty dressing/grooming: Reports Difficulty climbing stairs: Denies Difficulty getting out of chair: Reports Difficulty using hands for taps, buttons, cutlery, and/or writing: Reports  Review of Systems  Constitutional: Positive for fatigue. Negative for night sweats, weight gain and weight loss.  HENT: Negative for mouth sores, trouble swallowing, trouble swallowing, mouth dryness and nose dryness.   Eyes: Negative for pain, redness, itching, visual disturbance and dryness.  Respiratory: Negative for cough, shortness of breath and difficulty breathing.   Cardiovascular: Negative for chest pain, palpitations, hypertension, irregular heartbeat and swelling in legs/feet.  Gastrointestinal: Positive for constipation. Negative for blood in stool and diarrhea.  Endocrine: Negative for increased urination.  Genitourinary: Negative for difficulty urinating and vaginal dryness.  Musculoskeletal: Positive for arthralgias, joint pain, myalgias, morning stiffness, muscle tenderness and myalgias. Negative for joint swelling and muscle weakness.  Skin: Negative for color change, rash, hair loss, redness, skin tightness, ulcers and sensitivity to  sunlight.  Allergic/Immunologic: Negative for susceptible to infections.  Neurological: Positive for numbness. Negative for dizziness, headaches, memory loss, night sweats and weakness.  Hematological: Negative for bruising/bleeding tendency and swollen glands.  Psychiatric/Behavioral: Positive for sleep disturbance. Negative for depressed mood and confusion. The patient is not nervous/anxious.     PMFS History:  Patient Active Problem List   Diagnosis Date Noted  . Chronic left-sided low back pain with left-sided sciatica 07/22/2017  . Thyroid nodule 04/16/2017  . Essential hypertension 03/13/2014  . Menometrorrhagia   . Dysmenorrhea   . Anemia   . Hyperlipidemia 05/23/2007  . OBESITY, MORBID 05/23/2007  . DYSFUNCTIONAL UTERINE BLEEDING - s/p LAVH 05/23/2007  . ROTATOR CUFF SYNDROME 05/23/2007  . KNEE PAIN, HX OF 05/23/2007    Past Medical History:  Diagnosis Date  . Anemia   . Anxiety   . Arthritis   . Diabetes mellitus without complication ([place])    recent dx diet controlled  . DVT (deep venous thrombosis) (HCC)    remote - took coumadin  . Dysmenorrhea   . Dysrhythmia    irregular heart beat  . Fibroid   . GERD (gastroesophageal reflux disease)    hx of  . Gout   . Hypercholesteremia   . Hypertension 4 months  . Menometrorrhagia   . Morbid obesity ([place])   . Prediabetes   . Substance abuse ([place])     Family History  Problem Relation Age of Onset  . Diabetes Mother   . Hypertension Mother   . Diabetes Father   . Hypertension Father   . Cancer Father 51  . Heart disease Other        No family history  .  Cancer Brother 39   Past Surgical History:  Procedure Laterality Date  . ABDOMINAL HYSTERECTOMY  2008  . BREAST BIOPSY Left 2013   benign  . BUNIONECTOMY Bilateral   . CHOLECYSTECTOMY    . COLONOSCOPY WITH PROPOFOL N/A 10/27/2018   Procedure: COLONOSCOPY WITH PROPOFOL;  Surgeon: Wonda Horner, MD;  [facility];  Service: Endoscopy;  Laterality:  N/A;  . ECTOPIC PREGNANCY SURGERY  1980's  . MANDIBLE OSTEOTOMY Bilateral 07/03/2013   Procedure: BILATERAL TORI;  Surgeon: Gae Bon, DDS;  Location: [place];  Service: Oral Surgery;  Laterality: Bilateral;  . NM MYOCAR PERF WALL MOTION  07/2012   lexiscan - normal pattern of perfusion, low risk  . ROTATOR CUFF REPAIR Bilateral 2009  . SLEEP STUDY  07/27/2012   AHI 4.8/hr  . TOOTH EXTRACTION N/A 07/03/2013   Procedure: DENTAL EXTRACTION # 20;  Surgeon: Gae Bon, DDS;  Location: [place];  Service: Oral Surgery;  Laterality: N/A;  . TRANSTHORACIC ECHOCARDIOGRAM  07/2012   EF=>55%, mod conc LVH; LA mod dilated; trace MR; mild TR with normal RVSP; trace AV regurg  . TUBAL LIGATION     Social History   Social History Narrative  . Not on file   Immunization History  Administered Date(s) Administered  . PFIZER(Purple Top)SARS-COV-2 Vaccination 05/13/2020, 05/24/2020     Objective: Vital Signs: BP 95/61 (BP Location: Right Arm, Patient Position: Sitting, Cuff Size: Large)   Pulse 76   Ht 5\' 6"  (1.676 m)   Wt 288 lb 6.4 oz (130.8 kg)   BMI 46.55 kg/m    Physical Exam Vitals and nursing note reviewed.  Constitutional:      Appearance: She is well-developed and well-nourished.  HENT:     Head: Normocephalic and atraumatic.  Eyes:     Extraocular Movements: EOM normal.     Conjunctiva/sclera: Conjunctivae normal.  Cardiovascular:     Rate and Rhythm: Normal rate and regular rhythm.     Pulses: Intact distal pulses.     Heart sounds: Normal heart sounds.  Pulmonary:     Effort: Pulmonary effort is normal.     Breath sounds: Normal breath sounds.  Abdominal:     General: Bowel sounds are normal.     Palpations: Abdomen is soft.  Musculoskeletal:     Cervical back: Normal range of motion.  Lymphadenopathy:     Cervical: No cervical adenopathy.  Skin:    General: Skin is warm and dry.     Capillary Refill: Capillary refill takes less than 2 seconds.  Neurological:      Mental Status: She is alert and oriented to person, place, and time.  Psychiatric:        Mood and Affect: Mood and affect normal.        Behavior: Behavior normal.      Musculoskeletal Exam: She had good range of motion of her cervical spine.  She had painful limited range of motion of lumbar spine.  Shoulder joints, elbow joints, wrist joints, MCPs, PIPs and DIPs with good range of motion with no synovitis.  She had discomfort range of motion of bilateral hip joints especially the right.  She had painful range of motion of bilateral knee joints without any warmth swelling or effusion.  She had no tenderness over MTPs or ankle joints.  CDAI Exam: CDAI Score: -- Patient Global: --; Provider Global: -- Swollen: --; Tender: -- Joint Exam 11/04/2020   No joint exam has been documented for this  visit   There is currently no information documented on the homunculus. Go to the Rheumatology activity and complete the homunculus joint exam.  Investigation: No additional findings.  Imaging: No results found.  Recent Labs: Lab Results  Component Value Date   WBC 7.9 06/29/2013   HGB 13.9 12/26/2017   PLT 347 06/29/2013   NA 143 12/26/2017   K 4.1 12/26/2017   CL 104 12/26/2017   CO2 31 06/29/2013   GLUCOSE 115 (H) 12/26/2017   BUN 5 (L) 12/26/2017   CREATININE 0.70 12/26/2017   BILITOT 0.4 06/29/2013   ALKPHOS 77 06/29/2013   AST 23 06/29/2013   ALT 25 06/29/2013   PROT 8.4 (H) 06/29/2013   ALBUMIN 4.0 06/29/2013   CALCIUM 9.4 06/29/2013   GFRAA >90 06/29/2013   07/31/20: ANA+, dsDNA 18, RNP 0.2, smith-, Ro-, La- 02/13/20: TSH 4.006, T3 103.8, TPO-, vitamin D 23.44, Mg 1.80, Hep C-, RPR reactive, PTH 64.5, HSV1+, HSV2+,   September 23, 2020 ANA 1: 160 speckled, dsDNA negative, Smith negative, RNP negative, Ro negative, La negative, SCL 70 -, RNA polymerase 3 -, anticentromere negative, anti-Jo 1 -, CB CAP P:E C4d positive, anticardiolipin IgG week positive, antibeta-2 GP 1 IgG  equivocal, antiphosphatidylserine negative, antihistone IgG negative, RF negative, anti-CCP negative, anti-Carr P-, antithyroglobulin negative, anti-TPO negative  Speciality Comments: No specialty comments available.  Procedures:  No procedures performed Allergies: Buprenorphine hcl-naloxone hcl, Naloxone, and Dilaudid [hydromorphone hcl]   Assessment / Plan:     Visit Diagnoses: Positive ANA (antinuclear antibody) - August 08, 2020 ANA positive, double-stranded DNA positive, RPR positive.  No history of autoimmune disease.  History of DVTs.AVISE -I detailed discussion with the patient regarding the AVISE labs.  The repeat double-stranded DNA is negative.  She had low titer ANA, weakly positive anticardiolipin and IgM.  She has no history of oral ulcers, nasal ulcers, malar rash, photosensitivity, sicca symptoms, Raynaud's phenomenon, inflammatory arthritis or lymphadenopathy.  I have advised her to contact me if she develops any new symptoms.  I would like to monitor at this point.  Plan: ANA, Anti-DNA antibody, double-stranded, C3 and C4, Beta-2 glycoprotein antibodies, Cardiolipin antibodies, IgG, IgM, IgA, Sedimentation rate, CBC with Differential/Platelet, COMPLETE METABOLIC PANEL WITH GFR  Bilateral carpal tunnel syndrome-she has intermittent symptoms.  Chronic pain of both hips -   She plans to schedule an appointment with Dr. Durward Fortes.  Chronic pain of both knees - She uses a cane and is followed by Dr. Durward Fortes.  She continues to have discomfort in her knee joints.  DDD (degenerative disc disease), lumbar - Followed by Dr. Ernestina Patches.  She had radiofrequency ablation.  She continues to have lower back pain and some radiculopathy.  Chronic pain syndrome-she goes to [facility] for pain management.  History of DVT (deep vein thrombosis) - 2015, treated with warfarin for 1 year.  Essential hypertension-her blood pressure was normal today.  Other medical problems are listed as  follows:  History of hyperlipidemia  Hepatic steatosis  History of anemia  Thyroid nodule  Female pattern hair loss  Orders: Orders Placed This Encounter  Procedures  . ANA  . Anti-DNA antibody, double-stranded  . C3 and C4  . Beta-2 glycoprotein antibodies  . Cardiolipin antibodies, IgG, IgM, IgA  . Sedimentation rate  . CBC with Differential/Platelet  . COMPLETE METABOLIC PANEL WITH GFR   No orders of the defined types were placed in this encounter.    Follow-Up Instructions: Return in about 6 months (around 05/04/2021)  for +ANA.   Bo Merino, MD  Note - This record has been created using Editor, commissioning.  Chart creation errors have been sought, but may not always  have been located. Such creation errors do not reflect on  the standard of medical care.

## 2020-10-23 ENCOUNTER — Other Ambulatory Visit: Payer: Self-pay

## 2020-10-23 ENCOUNTER — Ambulatory Visit: Payer: Medicaid Other

## 2020-10-23 DIAGNOSIS — M5416 Radiculopathy, lumbar region: Secondary | ICD-10-CM | POA: Diagnosis not present

## 2020-10-23 DIAGNOSIS — M6281 Muscle weakness (generalized): Secondary | ICD-10-CM

## 2020-10-23 DIAGNOSIS — M5386 Other specified dorsopathies, lumbar region: Secondary | ICD-10-CM

## 2020-10-23 NOTE — Therapy (Signed)
[place], [place], [address] Phone: [phone]   Fax:  [phone]  Physical Therapy Treatment  Patient Details  Name: [name] MRN: [id number] Date of Birth: 1961-01-30 Referring Provider (PT): Riki Sheer, NP   Encounter Date: 10/23/2020   PT End of Session - 10/23/20 0958    Visit Number 3    Number of Visits 17    Date for PT Re-Evaluation 12/07/20    Authorization Type [place] - Visit Number 3    Authorization - Number of Visits 27    Progress Note Due on Visit 10    PT Start Time 1000    PT Stop Time 1045    PT Time Calculation (min) 45 min    Activity Tolerance Patient tolerated treatment well    Behavior During Therapy [facility] for tasks assessed/performed           Past Medical History:  Diagnosis Date  . Anemia   . Anxiety   . Arthritis   . Diabetes mellitus without complication ([place])    recent dx diet controlled  . DVT (deep venous thrombosis) (HCC)    remote - took coumadin  . Dysmenorrhea   . Dysrhythmia    irregular heart beat  . Fibroid   . GERD (gastroesophageal reflux disease)    hx of  . Gout   . Hypercholesteremia   . Hypertension 4 months  . Menometrorrhagia   . Morbid obesity ([place])   . Prediabetes   . Substance abuse [facility])     Past Surgical History:  Procedure Laterality Date  . ABDOMINAL HYSTERECTOMY  2008  . BREAST BIOPSY Left 2013   benign  . BUNIONECTOMY Bilateral   . CHOLECYSTECTOMY    . COLONOSCOPY WITH PROPOFOL N/A 10/27/2018   Procedure: COLONOSCOPY WITH PROPOFOL;  Surgeon: Wonda Horner, MD;  [facility];  Service: Endoscopy;  Laterality: N/A;  . ECTOPIC PREGNANCY SURGERY  1980's  . MANDIBLE OSTEOTOMY Bilateral 07/03/2013   Procedure: BILATERAL TORI;  Surgeon: Gae Bon, DDS;  Location: [place];  Service: Oral Surgery;  Laterality: Bilateral;  . NM MYOCAR PERF WALL  MOTION  07/2012   lexiscan - normal pattern of perfusion, low risk  . ROTATOR CUFF REPAIR Bilateral 2009  . SLEEP STUDY  07/27/2012   AHI 4.8/hr  . TOOTH EXTRACTION N/A 07/03/2013   Procedure: DENTAL EXTRACTION # 20;  Surgeon: Gae Bon, DDS;  Location: [place];  Service: Oral Surgery;  Laterality: N/A;  . TRANSTHORACIC ECHOCARDIOGRAM  07/2012   EF=>55%, mod conc LVH; LA mod dilated; trace MR; mild TR with normal RVSP; trace AV regurg  . TUBAL LIGATION      There were no vitals filed for this visit.   Subjective Assessment - 10/23/20 1004    Subjective No difference from last week.  Days vary with pain leve    Pain Score 4     Pain Location Back    Pain Orientation Left    Pain Descriptors / Indicators Throbbing;Aching    Pain Type Chronic pain    Pain Onset More than a month ago    Pain Frequency Constant    Aggravating Factors  being on feet    Pain Relieving Factors meds                             [facility] Adult  PT Treatment/Exercise - 10/23/20 0001      Lumbar Exercises: Stretches   Lower Trunk Rotation 5 reps;10 seconds    Lower Trunk Rotation Limitations RT/LT 1 set with feet apart and 1 with feet together.    Pelvic Tilt 10 reps;5 seconds    Other Lumbar Stretch Exercise seated flexion x 10    and side bven stretch x 15 sec  2 reps RT/LT      Lumbar Exercises: Supine   Clam Limitations single leg fall out with PPT  cued for breathing x5 RT/LT    Bent Knee Raise 10 reps    Bent Knee Raise Limitations RT/LT with PPT    Bridge 15 reps    Bridge with [employer] 10 reps    Bridge with [employer] Limitations short ROM    Other Supine Lumbar Exercises head and shoulder raises x 10    Other Supine Lumbar Exercises clam with PPT x 10      Manual Therapy   Passive ROM SKC, hip adduction x 2 each 30 sec  in supine.                    PT Short Term Goals - 10/23/20 1118      PT SHORT TERM GOAL #1   Title Pt will be Ind in an Initial HEP     Status Achieved             PT Long Term Goals - 09/28/20 2212      PT LONG TERM GOAL #1   Title Pt will be Ind in a final HEP to maintain or progress achieved level of function.    Target Date 12/07/20      PT LONG TERM GOAL #2   Title Pt will report improvement in her low back and L LE pain consistent to 5 to less with daily activites    Baseline 4-9/10    Status New    Target Date 12/07/20      PT LONG TERM GOAL #3   Title Pt's SB ROM  L and R will improve to a 25% limitation    Baseline 50% limitation    Status New    Target Date 12/07/20      PT LONG TERM GOAL #4   Title Pt will report tolerating walking 45 mins with low back and L LE pain not exceeding 5/10 to assist pt in exercise and continued weight loss.    Status New    Target Date 12/07/20                 Plan - 10/23/20 0959    Clinical Impression Statement She is able to do the exercises without incr pain today  but soreness with some exerciss. . She  has good abdominal control with PPT and  agreed to addition to HEP.  Continue per plan    PT Treatment/Interventions ADLs/Self Care Home Management;Cryotherapy;Electrical Stimulation;Iontophoresis 4mg /ml Dexamethasone;Moist Heat;Traction;Ultrasound;Therapeutic activities;Therapeutic exercise;Patient/family education;Passive range of motion;Dry needling;Taping;Joint Manipulations    PT Next Visit Plan Assess response to HEP and education. Add back/core strengthening exs as indicated.    PT Home Exercise Plan sitting flexion , supine head and shoulder raises.   PPT with hip flexion ,  ball squeeze with PPT .,   supine clam    Consulted and Agree with Plan of Care Patient           Patient will benefit from skilled therapeutic intervention in order  to improve the following deficits and impairments:  Decreased range of motion,Obesity,Postural dysfunction,Decreased strength,Improper body mechanics,Pain,Decreased activity tolerance  Visit  Diagnosis: Radiculopathy, lumbar region  Muscle weakness (generalized)  Decreased ROM of lumbar spine     Problem List Patient Active Problem List   Diagnosis Date Noted  . Chronic left-sided low back pain with left-sided sciatica 07/22/2017  . Thyroid nodule 04/16/2017  . Essential hypertension 03/13/2014  . Menometrorrhagia   . Dysmenorrhea   . Anemia   . Hyperlipidemia 05/23/2007  . OBESITY, MORBID 05/23/2007  . DYSFUNCTIONAL UTERINE BLEEDING - s/p LAVH 05/23/2007  . ROTATOR CUFF SYNDROME 05/23/2007  . KNEE PAIN, HX OF 05/23/2007    Darrel Hoover   PT 10/23/2020, 11:19 AM  [facility] [address] [place], [place], [address] Phone: [phone]   Fax:  [phone]  Name: Jolynne Tersigni MRN: [id number] Date of Birth: 05/05/1961

## 2020-10-24 ENCOUNTER — Other Ambulatory Visit: Payer: Self-pay | Admitting: Nurse Practitioner

## 2020-10-24 DIAGNOSIS — Z1231 Encounter for screening mammogram for malignant neoplasm of breast: Secondary | ICD-10-CM

## 2020-10-25 ENCOUNTER — Ambulatory Visit: Payer: Medicaid Other

## 2020-10-30 ENCOUNTER — Other Ambulatory Visit: Payer: Self-pay

## 2020-10-30 ENCOUNTER — Ambulatory Visit: Payer: Medicaid Other

## 2020-10-30 DIAGNOSIS — M5416 Radiculopathy, lumbar region: Secondary | ICD-10-CM | POA: Diagnosis not present

## 2020-10-30 DIAGNOSIS — M6281 Muscle weakness (generalized): Secondary | ICD-10-CM

## 2020-10-30 NOTE — Therapy (Signed)
[place], [place], [address] Phone: [phone]   Fax:  [phone]  Physical Therapy Treatment  Patient Details  Name: [name] MRN: [id number] Date of Birth: June 18, 1961 Referring Provider (PT): Riki Sheer, NP   Encounter Date: 10/30/2020   PT End of Session - 10/30/20 1005    Visit Number 4    Number of Visits 17    Date for PT Re-Evaluation 12/07/20    Authorization Type [place] MEDICAID [place] - Number of Visits 27    Progress Note Due on Visit 10    PT Start Time 1001    PT Stop Time 1047    PT Time Calculation (min) 46 min    Equipment Utilized During Treatment Other (comment)   SPC   Activity Tolerance Patient tolerated treatment well    Behavior During Therapy [facility] for tasks assessed/performed           Past Medical History:  Diagnosis Date  . Anemia   . Anxiety   . Arthritis   . Diabetes mellitus without complication ([place])    recent dx diet controlled  . DVT (deep venous thrombosis) (HCC)    remote - took coumadin  . Dysmenorrhea   . Dysrhythmia    irregular heart beat  . Fibroid   . GERD (gastroesophageal reflux disease)    hx of  . Gout   . Hypercholesteremia   . Hypertension 4 months  . Menometrorrhagia   . Morbid obesity ([place])   . Prediabetes   . Substance abuse [facility])     Past Surgical History:  Procedure Laterality Date  . ABDOMINAL HYSTERECTOMY  2008  . BREAST BIOPSY Left 2013   benign  . BUNIONECTOMY Bilateral   . CHOLECYSTECTOMY    . COLONOSCOPY WITH PROPOFOL N/A 10/27/2018   Procedure: COLONOSCOPY WITH PROPOFOL;  Surgeon: Wonda Horner, MD;  [facility];  Service: Endoscopy;  Laterality: N/A;  . ECTOPIC PREGNANCY SURGERY  1980's  . MANDIBLE OSTEOTOMY Bilateral 07/03/2013   Procedure: BILATERAL TORI;  Surgeon: Gae Bon, DDS;  Location: [place];  Service: Oral Surgery;  Laterality: Bilateral;  .  NM MYOCAR PERF WALL MOTION  07/2012   lexiscan - normal pattern of perfusion, low risk  . ROTATOR CUFF REPAIR Bilateral 2009  . SLEEP STUDY  07/27/2012   AHI 4.8/hr  . TOOTH EXTRACTION N/A 07/03/2013   Procedure: DENTAL EXTRACTION # 20;  Surgeon: Gae Bon, DDS;  Location: [place];  Service: Oral Surgery;  Laterality: N/A;  . TRANSTHORACIC ECHOCARDIOGRAM  07/2012   EF=>55%, mod conc LVH; LA mod dilated; trace MR; mild TR with normal RVSP; trace AV regurg  . TUBAL LIGATION      There were no vitals filed for this visit.   Subjective Assessment - 10/30/20 1445    Subjective Pt reports today is a good day [id number] level. Overall, pt states she is about the same, but uderstands getting better is going to take time.    Diagnostic tests 05/08/20-X-ray:Multilevel mild disc space narrowing and anterior spurring; moderate facet arthropathy L4-L5, L5-S1    Patient Stated Goals To continue to decrease her pain.    Currently in Pain? Yes    Pain Score 4     Pain Location Back    Pain Orientation Left    Pain Descriptors / Indicators Aching;Throbbing    Pain Type Chronic pain    Pain Onset More than  a month ago    Pain Frequency Constant    Aggravating Factors  Being on feet    Pain Relieving Factors Meds    Effect of Pain on Daily Activities Moderate impact                             [facility] Adult PT Treatment/Exercise - 10/30/20 0001      Exercises   Exercises Lumbar      Lumbar Exercises: Stretches   Active Hamstring Stretch Right;Left;2 reps;20 seconds    Pelvic Tilt 10 reps;5 seconds    Other Lumbar Stretch Exercise seated flexion 5x, 10 sec, and lateral flexion stretch 3x, 15 sec, RT/LT      Lumbar Exercises: Supine   Clam Limitations single leg fall out with PPT  cued for breathing x5 RT/LT    Bent Knee Raise 10 reps    Bent Knee Raise Limitations RT/LT with PPT    Bridge 15 reps    Bridge with [employer] 10 reps    Bridge with [employer] Limitations  short ROM    Other Supine Lumbar Exercises L and R hip abd c Green Tband; Hook lying      Lumbar Exercises: Sidelying   Other Sidelying Lumbar Exercises R and L modified open book ( bow draw) for trunk rotation; 10x  each side                  PT Education - 10/30/20 1031    Education Details Back care and body mechanics for daily activities at home. Trunk rotation was completed in sidelying which pt reported as being more comfortable for her than in supine.    Person(s) Educated Patient    Methods Explanation    Comprehension Verbalized understanding;Returned demonstration;Verbal cues required;Tactile cues required;Need further instruction            PT Short Term Goals - 10/23/20 1118      PT SHORT TERM GOAL #1   Title Pt will be Ind in an Initial HEP    Status Achieved             PT Long Term Goals - 09/28/20 2212      PT LONG TERM GOAL #1   Title Pt will be Ind in a final HEP to maintain or progress achieved level of function.    Target Date 12/07/20      PT LONG TERM GOAL #2   Title Pt will report improvement in her low back and L LE pain consistent to 5 to less with daily activites    Baseline 4-9/10    Status New    Target Date 12/07/20      PT LONG TERM GOAL #3   Title Pt's SB ROM  L and R will improve to a 25% limitation    Baseline 50% limitation    Status New    Target Date 12/07/20      PT LONG TERM GOAL #4   Title Pt will report tolerating walking 45 mins with low back and L LE pain not exceeding 5/10 to assist pt in exercise and continued weight loss.    Status New    Target Date 12/07/20                 Plan - 10/30/20 1006    Clinical Impression Statement Trunk rotation was modified in side lying which pt reports as being less stresssful for  her hips. Provided back care and body mechanics for daily activities at home. Pt voiced understanding and applications for herself. PT was completed for core and hip strengthening. Following  PT session, pt reported her low back was feeling better at 3/10. Pt will  continue to benefit from PT for back care Ed, lumbopelvic strengthening, and trunk flexion and LE flexibility to optimize functional mobility.    Personal Factors and Comorbidities Time since onset of injury/illness/exacerbation;Comorbidity 2;Comorbidity 1;Comorbidity 3+;Fitness    Comorbidities obesity, DM, anxiety, substance abuse    Examination-Activity Limitations Locomotion Level;Stand;Sleep;Sit    Stability/Clinical Decision Making Evolving/Moderate complexity    Clinical Decision Making Moderate    Rehab Potential Good    PT Frequency 2x / week    PT Duration 8 weeks    PT Treatment/Interventions ADLs/Self Care Home Management;Cryotherapy;Electrical Stimulation;Iontophoresis 4mg /ml Dexamethasone;Moist Heat;Traction;Ultrasound;Therapeutic activities;Therapeutic exercise;Patient/family education;Passive range of motion;Dry needling;Taping;Joint Manipulations    PT Next Visit Plan Assess response to HEP and education. Add back/core strengthening exs as indicated.    PT Home Exercise Plan sitting flexion , supine head and shoulder raises.   PPT with hip flexion ,  ball squeeze with PPT .,   supine clam    Consulted and Agree with Plan of Care Patient           Patient will benefit from skilled therapeutic intervention in order to improve the following deficits and impairments:  Decreased range of motion,Obesity,Postural dysfunction,Decreased strength,Improper body mechanics,Pain,Decreased activity tolerance  Visit Diagnosis: Radiculopathy, lumbar region  Muscle weakness (generalized)     Problem List Patient Active Problem List   Diagnosis Date Noted  . Chronic left-sided low back pain with left-sided sciatica 07/22/2017  . Thyroid nodule 04/16/2017  . Essential hypertension 03/13/2014  . Menometrorrhagia   . Dysmenorrhea   . Anemia   . Hyperlipidemia 05/23/2007  . OBESITY, MORBID 05/23/2007  .  DYSFUNCTIONAL UTERINE BLEEDING - s/p LAVH 05/23/2007  . ROTATOR CUFF SYNDROME 05/23/2007  . KNEE PAIN, HX OF 05/23/2007   Gar Ponto MS, PT 10/30/20 3:08 PM  [facility] [address] [place], [place], [address] Phone: [phone]   Fax:  [phone]  Name: [name] MRN: [id number] Date of Birth: 05/25/1961

## 2020-11-01 ENCOUNTER — Ambulatory Visit: Payer: Medicaid Other

## 2020-11-04 ENCOUNTER — Other Ambulatory Visit: Payer: Self-pay

## 2020-11-04 ENCOUNTER — Encounter: Payer: Self-pay | Admitting: Rheumatology

## 2020-11-04 ENCOUNTER — Ambulatory Visit: Payer: Medicaid Other | Admitting: Rheumatology

## 2020-11-04 VITALS — BP 95/61 | HR 76 | Ht 66.0 in | Wt 288.4 lb

## 2020-11-04 DIAGNOSIS — I10 Essential (primary) hypertension: Secondary | ICD-10-CM

## 2020-11-04 DIAGNOSIS — M51369 Other intervertebral disc degeneration, lumbar region without mention of lumbar back pain or lower extremity pain: Secondary | ICD-10-CM

## 2020-11-04 DIAGNOSIS — M25551 Pain in right hip: Secondary | ICD-10-CM

## 2020-11-04 DIAGNOSIS — R7689 Other specified abnormal immunological findings in serum: Secondary | ICD-10-CM

## 2020-11-04 DIAGNOSIS — Z8639 Personal history of other endocrine, nutritional and metabolic disease: Secondary | ICD-10-CM

## 2020-11-04 DIAGNOSIS — M25561 Pain in right knee: Secondary | ICD-10-CM | POA: Diagnosis not present

## 2020-11-04 DIAGNOSIS — Z862 Personal history of diseases of the blood and blood-forming organs and certain disorders involving the immune mechanism: Secondary | ICD-10-CM

## 2020-11-04 DIAGNOSIS — Z86718 Personal history of other venous thrombosis and embolism: Secondary | ICD-10-CM

## 2020-11-04 DIAGNOSIS — G5603 Carpal tunnel syndrome, bilateral upper limbs: Secondary | ICD-10-CM | POA: Diagnosis not present

## 2020-11-04 DIAGNOSIS — M25562 Pain in left knee: Secondary | ICD-10-CM

## 2020-11-04 DIAGNOSIS — M25552 Pain in left hip: Secondary | ICD-10-CM

## 2020-11-04 DIAGNOSIS — K760 Fatty (change of) liver, not elsewhere classified: Secondary | ICD-10-CM

## 2020-11-04 DIAGNOSIS — G894 Chronic pain syndrome: Secondary | ICD-10-CM

## 2020-11-04 DIAGNOSIS — R768 Other specified abnormal immunological findings in serum: Secondary | ICD-10-CM

## 2020-11-04 DIAGNOSIS — G8929 Other chronic pain: Secondary | ICD-10-CM

## 2020-11-04 DIAGNOSIS — L658 Other specified nonscarring hair loss: Secondary | ICD-10-CM

## 2020-11-04 DIAGNOSIS — M5136 Other intervertebral disc degeneration, lumbar region: Secondary | ICD-10-CM

## 2020-11-04 DIAGNOSIS — E041 Nontoxic single thyroid nodule: Secondary | ICD-10-CM

## 2020-11-04 NOTE — Patient Instructions (Signed)
Please comment the office 2 weeks prior to your appointment for the lab work.

## 2020-11-06 ENCOUNTER — Ambulatory Visit: Payer: Medicaid Other

## 2020-11-06 ENCOUNTER — Other Ambulatory Visit: Payer: Self-pay

## 2020-11-06 DIAGNOSIS — M5416 Radiculopathy, lumbar region: Secondary | ICD-10-CM

## 2020-11-06 DIAGNOSIS — M6281 Muscle weakness (generalized): Secondary | ICD-10-CM

## 2020-11-06 DIAGNOSIS — M5386 Other specified dorsopathies, lumbar region: Secondary | ICD-10-CM

## 2020-11-06 NOTE — Therapy (Signed)
[place], [place], [address] Phone: [phone]   Fax:  [phone]  Physical Therapy Treatment  Patient Details  Name: [name] MRN: [id number] Date of Birth: 1961/08/13 Referring Provider (PT): Riki Sheer, NP   Encounter Date: 11/06/2020   PT End of Session - 11/06/20 1012    Visit Number 5    Number of Visits 17    Date for PT Re-Evaluation 12/07/20    Authorization Type [place] MEDICAID [place] - Number of Visits 27    Progress Note Due on Visit 10    PT Start Time 1002    PT Stop Time 1045    PT Time Calculation (min) 43 min    Equipment Utilized During Treatment Other (comment)   SPC   Activity Tolerance Patient tolerated treatment well    Behavior During Therapy [facility] for tasks assessed/performed           Past Medical History:  Diagnosis Date  . Anemia   . Anxiety   . Arthritis   . Diabetes mellitus without complication ([place])    recent dx diet controlled  . DVT (deep venous thrombosis) (HCC)    remote - took coumadin  . Dysmenorrhea   . Dysrhythmia    irregular heart beat  . Fibroid   . GERD (gastroesophageal reflux disease)    hx of  . Gout   . Hypercholesteremia   . Hypertension 4 months  . Menometrorrhagia   . Morbid obesity ([place])   . Prediabetes   . Substance abuse [facility])     Past Surgical History:  Procedure Laterality Date  . ABDOMINAL HYSTERECTOMY  2008  . BREAST BIOPSY Left 2013   benign  . BUNIONECTOMY Bilateral   . CHOLECYSTECTOMY    . COLONOSCOPY WITH PROPOFOL N/A 10/27/2018   Procedure: COLONOSCOPY WITH PROPOFOL;  Surgeon: Wonda Horner, MD;  [facility];  Service: Endoscopy;  Laterality: N/A;  . ECTOPIC PREGNANCY SURGERY  1980's  . MANDIBLE OSTEOTOMY Bilateral 07/03/2013   Procedure: BILATERAL TORI;  Surgeon: Gae Bon, DDS;  Location: [place];  Service: Oral Surgery;  Laterality: Bilateral;  .  NM MYOCAR PERF WALL MOTION  07/2012   lexiscan - normal pattern of perfusion, low risk  . ROTATOR CUFF REPAIR Bilateral 2009  . SLEEP STUDY  07/27/2012   AHI 4.8/hr  . TOOTH EXTRACTION N/A 07/03/2013   Procedure: DENTAL EXTRACTION # 20;  Surgeon: Gae Bon, DDS;  Location: [place];  Service: Oral Surgery;  Laterality: N/A;  . TRANSTHORACIC ECHOCARDIOGRAM  07/2012   EF=>55%, mod conc LVH; LA mod dilated; trace MR; mild TR with normal RVSP; trace AV regurg  . TUBAL LIGATION      There were no vitals filed for this visit.   Subjective Assessment - 11/06/20 1008    Subjective Pt states she is doing OK. i'm functioning better and the back pain has eased off primarily in the 3/10 level. today, my pain is a little higher. I'm able to complete my daily activities.    Currently in Pain? Yes    Pain Score 5     Pain Location Back    Pain Orientation Left    Pain Descriptors / Indicators Aching;Throbbing    Pain Type Chronic pain    Pain Onset More than a month ago    Pain Frequency Constant    Aggravating Factors  Being on feet  Pain Relieving Factors Meds, PT                             [facility] Adult PT Treatment/Exercise - 11/06/20 0001      Exercises   Exercises Lumbar      Lumbar Exercises: Stretches   Active Hamstring Stretch Right;Left;2 reps;20 seconds    Lower Trunk Rotation 3 reps;10 seconds    Lower Trunk Rotation Limitations RT/LT 1 set with feet apart and 1 with feet together.    Hip Flexor Stretch Right;Left;3 reps;20 seconds    Hip Flexor Stretch Limitations Attempted in supine, but pt was not able totolerate. Completed in standing    Pelvic Tilt 10 reps;5 seconds    Other Lumbar Stretch Exercise seated flexion 3x, 10 sec, and lateral flexion stretch 3x, 15 sec, RT/LT      Lumbar Exercises: Aerobic   Nustep L#; 8 mins; UEs/LEs      Lumbar Exercises: Standing   Row Both;15 reps    Theraband Level (Row) Level 2 (Red)    Shoulder Extension Both;15  reps    Theraband Level (Shoulder Extension) Level 2 (Red)      Lumbar Exercises: Supine   Clam Limitations single leg fall out with PPT  cued for breathing x5 RT/LT    Bent Knee Raise 10 reps    Bent Knee Raise Limitations RT/LT with PPT    Bridge 15 reps    Bridge with [employer] 10 reps    Bridge with [employer] Limitations short ROM    Other Supine Lumbar Exercises L and R hip abd c Green Tband; Hook lying                  PT Education - 11/06/20 1050    Education Details HEP    Person(s) Educated Patient;Spouse;Child(ren);Parent(s);Caregiver(s)    Methods Explanation;Demonstration;Tactile cues;Verbal cues;Handout    Comprehension Verbalized understanding;Returned demonstration;Verbal cues required;Tactile cues required;Need further instruction            PT Short Term Goals - 10/23/20 1118      PT SHORT TERM GOAL #1   Title Pt will be Ind in an Initial HEP    Status Achieved             PT Long Term Goals - 09/28/20 2212      PT LONG TERM GOAL #1   Title Pt will be Ind in a final HEP to maintain or progress achieved level of function.    Target Date 12/07/20      PT LONG TERM GOAL #2   Title Pt will report improvement in her low back and L LE pain consistent to 5 to less with daily activites    Baseline 4-9/10    Status New    Target Date 12/07/20      PT LONG TERM GOAL #3   Title Pt's SB ROM  L and R will improve to a 25% limitation    Baseline 50% limitation    Status New    Target Date 12/07/20      PT LONG TERM GOAL #4   Title Pt will report tolerating walking 45 mins with low back and L LE pain not exceeding 5/10 to assist pt in exercise and continued weight loss.    Status New    Target Date 12/07/20                 Plan -  11/06/20 1034    Clinical Impression Statement Standing posterior chain strengthening exs and a hip flexor stretching ex were added to the pt's ther ex and HEP. Pt's subjective report indicates improving  pain and functional ability. Pt participates in PT with good effort.    Personal Factors and Comorbidities Time since onset of injury/illness/exacerbation;Comorbidity 2;Comorbidity 1;Comorbidity 3+;Fitness    Comorbidities obesity, DM, anxiety, substance abuse    Examination-Activity Limitations Locomotion Level;Stand;Sleep;Sit    Stability/Clinical Decision Making Evolving/Moderate complexity    Clinical Decision Making Moderate    Rehab Potential Good    PT Frequency 2x / week    PT Duration 8 weeks    PT Treatment/Interventions ADLs/Self Care Home Management;Cryotherapy;Electrical Stimulation;Iontophoresis 4mg /ml Dexamethasone;Moist Heat;Traction;Ultrasound;Therapeutic activities;Therapeutic exercise;Patient/family education;Passive range of motion;Dry needling;Taping;Joint Manipulations    PT Next Visit Plan Assess response to HEP and education. Add back/core strengthening exs as indicated.    PT Home Exercise Plan sitting flexion , supine head and shoulder raises.   PPT with hip flexion ,  ball squeeze with PPT .,   supine clam    Consulted and Agree with Plan of Care Patient           Patient will benefit from skilled therapeutic intervention in order to improve the following deficits and impairments:  Decreased range of motion,Obesity,Postural dysfunction,Decreased strength,Improper body mechanics,Pain,Decreased activity tolerance  Visit Diagnosis: Radiculopathy, lumbar region  Muscle weakness (generalized)  Decreased ROM of lumbar spine     Problem List Patient Active Problem List   Diagnosis Date Noted  . Chronic left-sided low back pain with left-sided sciatica 07/22/2017  . Thyroid nodule 04/16/2017  . Essential hypertension 03/13/2014  . Menometrorrhagia   . Dysmenorrhea   . Anemia   . Hyperlipidemia 05/23/2007  . OBESITY, MORBID 05/23/2007  . DYSFUNCTIONAL UTERINE BLEEDING - s/p LAVH 05/23/2007  . ROTATOR CUFF SYNDROME 05/23/2007  . KNEE PAIN, HX OF 05/23/2007     Gar Ponto MS, PT 11/06/20 7:55 PM  [facility] [address] [place], [place], [address] Phone: [phone]   Fax:  [phone]  Name: Anabella Capshaw MRN: [id number] Date of Birth: 07/30/1961

## 2020-11-08 ENCOUNTER — Other Ambulatory Visit: Payer: Self-pay

## 2020-11-08 ENCOUNTER — Ambulatory Visit: Payer: Medicaid Other

## 2020-11-08 DIAGNOSIS — M5416 Radiculopathy, lumbar region: Secondary | ICD-10-CM | POA: Diagnosis not present

## 2020-11-08 DIAGNOSIS — M5386 Other specified dorsopathies, lumbar region: Secondary | ICD-10-CM

## 2020-11-08 DIAGNOSIS — M6281 Muscle weakness (generalized): Secondary | ICD-10-CM

## 2020-11-08 NOTE — Therapy (Signed)
[place], [place], [address] Phone: [phone]   Fax:  [phone]  Physical Therapy Treatment  Patient Details  Name: [name] MRN: [id number] Date of Birth: 12-09-1960 Referring Provider (PT): Riki Sheer, NP   Encounter Date: 11/08/2020   PT End of Session - 11/08/20 1036    Visit Number 6    Number of Visits 17    Date for PT Re-Evaluation 12/07/20    Authorization Type [place] MEDICAID [place] - Number of Visits 27    PT Start Time 1005    PT Stop Time 1045    PT Time Calculation (min) 40 min    Equipment Utilized During Treatment Other (comment)   SPC   Activity Tolerance Patient tolerated treatment well    Behavior During Therapy [facility] for tasks assessed/performed           Past Medical History:  Diagnosis Date  . Anemia   . Anxiety   . Arthritis   . Diabetes mellitus without complication ([place])    recent dx diet controlled  . DVT (deep venous thrombosis) (HCC)    remote - took coumadin  . Dysmenorrhea   . Dysrhythmia    irregular heart beat  . Fibroid   . GERD (gastroesophageal reflux disease)    hx of  . Gout   . Hypercholesteremia   . Hypertension 4 months  . Menometrorrhagia   . Morbid obesity ([place])   . Prediabetes   . Substance abuse [facility])     Past Surgical History:  Procedure Laterality Date  . ABDOMINAL HYSTERECTOMY  2008  . BREAST BIOPSY Left 2013   benign  . BUNIONECTOMY Bilateral   . CHOLECYSTECTOMY    . COLONOSCOPY WITH PROPOFOL N/A 10/27/2018   Procedure: COLONOSCOPY WITH PROPOFOL;  Surgeon: Wonda Horner, MD;  [facility];  Service: Endoscopy;  Laterality: N/A;  . ECTOPIC PREGNANCY SURGERY  1980's  . MANDIBLE OSTEOTOMY Bilateral 07/03/2013   Procedure: BILATERAL TORI;  Surgeon: Gae Bon, DDS;  Location: [place];  Service: Oral Surgery;  Laterality: Bilateral;  . NM MYOCAR PERF WALL MOTION   07/2012   lexiscan - normal pattern of perfusion, low risk  . ROTATOR CUFF REPAIR Bilateral 2009  . SLEEP STUDY  07/27/2012   AHI 4.8/hr  . TOOTH EXTRACTION N/A 07/03/2013   Procedure: DENTAL EXTRACTION # 20;  Surgeon: Gae Bon, DDS;  Location: [place];  Service: Oral Surgery;  Laterality: N/A;  . TRANSTHORACIC ECHOCARDIOGRAM  07/2012   EF=>55%, mod conc LVH; LA mod dilated; trace MR; mild TR with normal RVSP; trace AV regurg  . TUBAL LIGATION      There were no vitals filed for this visit.   Subjective Assessment - 11/08/20 1033    Subjective Pt reports she is hurting more today with her back and knees. Overall, pt reprts her ROM and standing tolerance has improved.    Diagnostic tests 05/08/20-X-ray:Multilevel mild disc space narrowing and anterior spurring; moderate facet arthropathy L4-L5, L5-S1    Currently in Pain? Yes    Pain Score 5     Pain Location Back    Pain Orientation Lower    Pain Descriptors / Indicators Aching;Throbbing    Pain Type Chronic pain    Pain Onset More than a month ago    Pain Frequency Constant  [place] Adult PT Treatment/Exercise - 11/08/20 0001      Exercises   Exercises Lumbar      Lumbar Exercises: Stretches   Active Hamstring Stretch Right;Left;2 reps;20 seconds    Lower Trunk Rotation 5 reps;10 seconds    Pelvic Tilt 5 seconds;15 reps    Other Lumbar Stretch Exercise seated flexion 3x, 10 sec, and lateral flexion stretch 3x, 15 sec, RT/LT      Lumbar Exercises: Aerobic   Nustep L3; 6 mins; UEs/LEs      Lumbar Exercises: Standing   Row Both;15 reps    Theraband Level (Row) Level 2 (Red)    Shoulder Extension Both;15 reps    Theraband Level (Shoulder Extension) Level 2 (Red)      Lumbar Exercises: Supine   Clam Limitations single leg fall out with PPT  cued for breathing x5 RT/LT    Bridge 15 reps    Other Supine Lumbar Exercises head and shoulder raises x 10    Other Supine Lumbar  Exercises Bilat hip abd c Green Tband; Hook lying 15x                    PT Short Term Goals - 10/23/20 1118      PT SHORT TERM GOAL #1   Title Pt will be Ind in an Initial HEP    Status Achieved             PT Long Term Goals - 09/28/20 2212      PT LONG TERM GOAL #1   Title Pt will be Ind in a final HEP to maintain or progress achieved level of function.    Target Date 12/07/20      PT LONG TERM GOAL #2   Title Pt will report improvement in her low back and L LE pain consistent to 5 to less with daily activites    Baseline 4-9/10    Status New    Target Date 12/07/20      PT LONG TERM GOAL #3   Title Pt's SB ROM  L and R will improve to a 25% limitation    Baseline 50% limitation    Status New    Target Date 12/07/20      PT LONG TERM GOAL #4   Title Pt will report tolerating walking 45 mins with low back and L LE pain not exceeding 5/10 to assist pt in exercise and continued weight loss.    Status New    Target Date 12/07/20                 Plan - 11/08/20 1037    Clinical Impression Statement Standing hip flexor stretch was DCed with pt reporting discomfort with it's completion. PT was provided for lumbopelvic and LE mobility and strengthening. Pt reported feeling better following the PT sessiin today. Pt will benefit from gradual progression in her ther ex to improve her functional mobility. Pt states she would like to be able to tolerate walking longer distances.    Personal Factors and Comorbidities Time since onset of injury/illness/exacerbation;Comorbidity 2;Comorbidity 1;Comorbidity 3+;Fitness    Comorbidities obesity, DM, anxiety, substance abuse    Examination-Activity Limitations Locomotion Level;Stand;Sleep;Sit    Clinical Decision Making Moderate    Rehab Potential Good    PT Frequency 2x / week    PT Duration 8 weeks    PT Treatment/Interventions ADLs/Self Care Home Management;Cryotherapy;Electrical Stimulation;Iontophoresis 4mg /ml  Dexamethasone;Moist Heat;Traction;Ultrasound;Therapeutic activities;Therapeutic exercise;Patient/family education;Passive range of motion;Dry needling;Taping;Joint Manipulations  PT Next Visit Plan Assess response to HEP and education. Progress LE/back/core strengthening exs as tolerated.    PT Home Exercise Plan sitting flexion , supine head and shoulder raises.   PPT with hip flexion ,  ball squeeze with PPT .,   supine clam    Consulted and Agree with Plan of Care Patient           Patient will benefit from skilled therapeutic intervention in order to improve the following deficits and impairments:  Decreased range of motion,Obesity,Postural dysfunction,Decreased strength,Improper body mechanics,Pain,Decreased activity tolerance  Visit Diagnosis: Radiculopathy, lumbar region  Muscle weakness (generalized)  Decreased ROM of lumbar spine     Problem List Patient Active Problem List   Diagnosis Date Noted  . Chronic left-sided low back pain with left-sided sciatica 07/22/2017  . Thyroid nodule 04/16/2017  . Essential hypertension 03/13/2014  . Menometrorrhagia   . Dysmenorrhea   . Anemia   . Hyperlipidemia 05/23/2007  . OBESITY, MORBID 05/23/2007  . DYSFUNCTIONAL UTERINE BLEEDING - s/p LAVH 05/23/2007  . ROTATOR CUFF SYNDROME 05/23/2007  . KNEE PAIN, HX OF 05/23/2007    Gar Ponto 11/08/2020, 4:04 PM  [facility] [address] [place], [place], [address] Phone: [phone]   Fax:  [phone]  Name: [name] MRN: [id number] Date of Birth: [birth year]

## 2020-11-13 ENCOUNTER — Ambulatory Visit: Payer: Medicaid Other

## 2020-11-15 ENCOUNTER — Ambulatory Visit: Payer: Medicaid Other | Attending: Nurse Practitioner

## 2020-11-15 ENCOUNTER — Other Ambulatory Visit: Payer: Self-pay

## 2020-11-15 DIAGNOSIS — M6281 Muscle weakness (generalized): Secondary | ICD-10-CM | POA: Insufficient documentation

## 2020-11-15 DIAGNOSIS — M5386 Other specified dorsopathies, lumbar region: Secondary | ICD-10-CM | POA: Insufficient documentation

## 2020-11-15 DIAGNOSIS — M5416 Radiculopathy, lumbar region: Secondary | ICD-10-CM | POA: Diagnosis not present

## 2020-11-16 NOTE — Therapy (Signed)
[place], [place], [address] Phone: [phone]   Fax:  [phone]  Physical Therapy Treatment  Patient Details  Name: [name] MRN: [id number] Date of Birth: 08/17/1961 Referring Provider (PT): Riki Sheer, NP   Encounter Date: 11/15/2020   PT End of Session - 11/15/20 1008    Visit Number 7    Number of Visits 17    Date for PT Re-Evaluation 12/07/20    Authorization Type [place] MEDICAID [place] - Number of Visits 27    Progress Note Due on Visit 10    PT Start Time 1002    PT Stop Time 1044    PT Time Calculation (min) 42 min    Equipment Utilized During Treatment Other (comment)    Activity Tolerance Patient tolerated treatment well    Behavior During Therapy WFL for tasks assessed/performed           Past Medical History:  Diagnosis Date  . Anemia   . Anxiety   . Arthritis   . Diabetes mellitus without complication ([place])    recent dx diet controlled  . DVT (deep venous thrombosis) (HCC)    remote - took coumadin  . Dysmenorrhea   . Dysrhythmia    irregular heart beat  . Fibroid   . GERD (gastroesophageal reflux disease)    hx of  . Gout   . Hypercholesteremia   . Hypertension 4 months  . Menometrorrhagia   . Morbid obesity ([place])   . Prediabetes   . Substance abuse [facility])     Past Surgical History:  Procedure Laterality Date  . ABDOMINAL HYSTERECTOMY  2008  . BREAST BIOPSY Left 2013   benign  . BUNIONECTOMY Bilateral   . CHOLECYSTECTOMY    . COLONOSCOPY WITH PROPOFOL N/A 10/27/2018   Procedure: COLONOSCOPY WITH PROPOFOL;  Surgeon: Wonda Horner, MD;  [facility];  Service: Endoscopy;  Laterality: N/A;  . ECTOPIC PREGNANCY SURGERY  1980's  . MANDIBLE OSTEOTOMY Bilateral 07/03/2013   Procedure: BILATERAL TORI;  Surgeon: Gae Bon, DDS;  Location: [place];  Service: Oral Surgery;  Laterality: Bilateral;  . NM  MYOCAR PERF WALL MOTION  07/2012   lexiscan - normal pattern of perfusion, low risk  . ROTATOR CUFF REPAIR Bilateral 2009  . SLEEP STUDY  07/27/2012   AHI 4.8/hr  . TOOTH EXTRACTION N/A 07/03/2013   Procedure: DENTAL EXTRACTION # 20;  Surgeon: Gae Bon, DDS;  Location: [place];  Service: Oral Surgery;  Laterality: N/A;  . TRANSTHORACIC ECHOCARDIOGRAM  07/2012   EF=>55%, mod conc LVH; LA mod dilated; trace MR; mild TR with normal RVSP; trace AV regurg  . TUBAL LIGATION      There were no vitals filed for this visit.   Subjective Assessment - 11/15/20 1011    Subjective Pt reports she is feeling good today.    Diagnostic tests 05/08/20-X-ray:Multilevel mild disc space narrowing and anterior spurring; moderate facet arthropathy L4-L5, L5-S1    Patient Stated Goals To continue to decrease her pain.    Currently in Pain? Yes    Pain Score 3     Pain Location Back    Pain Descriptors / Indicators Aching;Throbbing    Pain Type Chronic pain    Pain Onset More than a month ago    Pain Frequency Constant    Aggravating Factors  Being on feet    Pain Relieving Factors Meds, PT  Effect of Pain on Daily Activities Moderate impact              [facility] PT Assessment - 11/16/20 0001      Ambulation/Gait   Gait velocity 2 MWT c SPC= 221ft      Balance   Balance Assessed Yes                         [facility] Adult PT Treatment/Exercise - 11/16/20 0001      Ambulation/Gait   Gait Comments Walking to address activity tolerance- Pt walked 370 ft c a SPC. Distance was limited by an increase in L low back pain.      Exercises   Exercises Lumbar;Knee/Hip      Lumbar Exercises: Stretches   Active Hamstring Stretch Right;Left;2 reps;20 seconds    Other Lumbar Stretch Exercise seated flexion 3x, 10 sec, and lateral flexion stretch 3x, 15 sec, RT/LT      Lumbar Exercises: Aerobic   Nustep L3; 6 mins; UEs/LEs      Knee/Hip Exercises: Standing   Hip Abduction Right   Pt  completed a few reps and it was DCed due to hip pressure   Functional Squat 10 reps    Functional Squat Limitations hand hold    Other Standing Knee Exercises Side stepping at counter, hands hovering, 60ft x 4               Balance Exercises - 11/15/20 0001      Balance Exercises: Standing   Standing Eyes Opened Wide (BOA);Foam/compliant surface;2 reps;30 secs;Narrow base of support (BOS)   hands as needed, 2x each c narrow and wide   Standing Eyes Closed Wide (BOA);Foam/compliant surface;2 reps;30 secs               PT Short Term Goals - 10/23/20 1118      PT SHORT TERM GOAL #1   Title Pt will be Ind in an Initial HEP    Status Achieved             PT Long Term Goals - 11/16/20 0707      PT LONG TERM GOAL #1   Title Pt will be Ind in a final HEP to maintain or progress achieved level of function.      PT LONG TERM GOAL #2   Title Pt will report improvement in her low back and L LE pain consistent to 5 to less with daily activites    Baseline 4-9/10    Status New      PT LONG TERM GOAL #3   Title Pt's SB ROM  L and R will improve to a 25% limitation    Baseline 50% limitation    Status New      PT LONG TERM GOAL #4   Title Pt will report tolerating walking 45 mins with low back and L LE pain not exceeding 5/10 to assist pt in exercise and continued weight loss.    Status New      PT LONG TERM GOAL #5   Title Increase 2MWT to 330 ft c SPC for improved functional mobility    Baseline 287 ft c [facility]    Status New    Target Date 12/07/20                 Plan - 11/15/20 1007    Clinical Impression Statement Strengthing for the LEs and trunk today was progressed to initial CKC exs.  Pt was not able to tolerate single leg stance L for more than several seconds. Hip abd ex was changed to side stepping, which the pt tolerated. Pt also tolerated squats at the sink. Pt's walking tolerance was addressed with a 2MWT c a SPC as part of a longer walking sequence.  Pt walks at a slow pace and walking was limited by an increase in L low back pain. Pain decreased to baseline by the end of the PT session.    Personal Factors and Comorbidities Time since onset of injury/illness/exacerbation;Comorbidity 2;Comorbidity 1;Comorbidity 3+;Fitness    Comorbidities obesity, DM, anxiety, substance abuse    Examination-Activity Limitations Locomotion Level;Stand;Sleep;Sit    Stability/Clinical Decision Making Evolving/Moderate complexity    Clinical Decision Making Moderate    Rehab Potential Good    PT Frequency 2x / week    PT Duration 8 weeks    PT Treatment/Interventions ADLs/Self Care Home Management;Cryotherapy;Electrical Stimulation;Iontophoresis 4mg /ml Dexamethasone;Moist Heat;Traction;Ultrasound;Therapeutic activities;Therapeutic exercise;Patient/family education;Passive range of motion;Dry needling;Taping;Joint Manipulations    PT Next Visit Plan Assess response to HEP and education. Progress LE/back/core strengthening exs as tolerated.    PT Home Exercise Plan sitting flexion , supine head and shoulder raises.   PPT with hip flexion ,  ball squeeze with PPT .,   supine clam    Consulted and Agree with Plan of Care Patient           Patient will benefit from skilled therapeutic intervention in order to improve the following deficits and impairments:  Decreased range of motion,Obesity,Postural dysfunction,Decreased strength,Improper body mechanics,Pain,Decreased activity tolerance  Visit Diagnosis: Radiculopathy, lumbar region  Muscle weakness (generalized)  Decreased ROM of lumbar spine     Problem List Patient Active Problem List   Diagnosis Date Noted  . Chronic left-sided low back pain with left-sided sciatica 07/22/2017  . Thyroid nodule 04/16/2017  . Essential hypertension 03/13/2014  . Menometrorrhagia   . Dysmenorrhea   . Anemia   . Hyperlipidemia 05/23/2007  . OBESITY, MORBID 05/23/2007  . DYSFUNCTIONAL UTERINE BLEEDING - s/p LAVH  05/23/2007  . ROTATOR CUFF SYNDROME 05/23/2007  . KNEE PAIN, HX OF 05/23/2007    Gar Ponto MS, PT 11/16/20 7:12 AM  [facility] [address] [place], [place], [address] Phone: [phone]   Fax:  [phone]  Name: Caliah Kopke MRN: [id number] Date of Birth: 1961-03-01

## 2020-11-20 ENCOUNTER — Ambulatory Visit: Payer: Medicaid Other

## 2020-11-20 ENCOUNTER — Other Ambulatory Visit: Payer: Self-pay

## 2020-11-20 DIAGNOSIS — M5416 Radiculopathy, lumbar region: Secondary | ICD-10-CM | POA: Diagnosis not present

## 2020-11-20 DIAGNOSIS — M6281 Muscle weakness (generalized): Secondary | ICD-10-CM

## 2020-11-20 DIAGNOSIS — M5386 Other specified dorsopathies, lumbar region: Secondary | ICD-10-CM

## 2020-11-20 NOTE — Therapy (Signed)
[place], [place], [address] Phone: [phone]   Fax:  [phone]  Physical Therapy Treatment  Patient Details  Name: [name] MRN: [id number] Date of Birth: 1960/12/22 Referring Provider (PT): Riki Sheer, NP   Encounter Date: 11/20/2020   PT End of Session - 11/20/20 0916    Visit Number 8    Number of Visits 17    Date for PT Re-Evaluation 12/07/20    Authorization Type [place] MEDICAID [place] - Number of Visits 27    Progress Note Due on Visit 10    PT Start Time 0915    PT Stop Time 1000    PT Time Calculation (min) 45 min    Equipment Utilized During Treatment Other (comment)   SPC   Activity Tolerance Patient tolerated treatment well    Behavior During Therapy [facility] for tasks assessed/performed           Past Medical History:  Diagnosis Date  . Anemia   . Anxiety   . Arthritis   . Diabetes mellitus without complication ([place])    recent dx diet controlled  . DVT (deep venous thrombosis) (HCC)    remote - took coumadin  . Dysmenorrhea   . Dysrhythmia    irregular heart beat  . Fibroid   . GERD (gastroesophageal reflux disease)    hx of  . Gout   . Hypercholesteremia   . Hypertension 4 months  . Menometrorrhagia   . Morbid obesity ([place])   . Prediabetes   . Substance abuse [facility])     Past Surgical History:  Procedure Laterality Date  . ABDOMINAL HYSTERECTOMY  2008  . BREAST BIOPSY Left 2013   benign  . BUNIONECTOMY Bilateral   . CHOLECYSTECTOMY    . COLONOSCOPY WITH PROPOFOL N/A 10/27/2018   Procedure: COLONOSCOPY WITH PROPOFOL;  Surgeon: Wonda Horner, MD;  [facility];  Service: Endoscopy;  Laterality: N/A;  . ECTOPIC PREGNANCY SURGERY  1980's  . MANDIBLE OSTEOTOMY Bilateral 07/03/2013   Procedure: BILATERAL TORI;  Surgeon: Gae Bon, DDS;  Location: [place];  Service: Oral Surgery;  Laterality: Bilateral;  .  NM MYOCAR PERF WALL MOTION  07/2012   lexiscan - normal pattern of perfusion, low risk  . ROTATOR CUFF REPAIR Bilateral 2009  . SLEEP STUDY  07/27/2012   AHI 4.8/hr  . TOOTH EXTRACTION N/A 07/03/2013   Procedure: DENTAL EXTRACTION # 20;  Surgeon: Gae Bon, DDS;  Location: [place];  Service: Oral Surgery;  Laterality: N/A;  . TRANSTHORACIC ECHOCARDIOGRAM  07/2012   EF=>55%, mod conc LVH; LA mod dilated; trace MR; mild TR with normal RVSP; trace AV regurg  . TUBAL LIGATION      There were no vitals filed for this visit.   Subjective Assessment - 11/20/20 0924    Subjective Pt reports she is trying to progress her HEP with the standng exs and tolerating them well. Pt feels good about her progress. Pt states she is able to walk lnger and be active with household activities.    Diagnostic tests 05/08/20-X-ray:Multilevel mild disc space narrowing and anterior spurring; moderate facet arthropathy L4-L5, L5-S1    Patient Stated Goals To continue to decrease her pain.    Currently in Pain? Yes    Pain Score 4     Pain Location Back    Pain Orientation Lower    Pain Descriptors / Indicators Aching;Throbbing  Pain Type Chronic pain    Pain Onset More than a month ago    Pain Frequency Constant                             [facility] Adult PT Treatment/Exercise - 11/20/20 0001      Ambulation/Gait   Gait Comments Walking to address activity tolerance- Pt walked 555 ft c a SPC. Distance was limited by an increase in R h.ip      Exercises   Exercises Lumbar;Knee/Hip      Lumbar Exercises: Stretches   Active Hamstring Stretch Right;Left;2 reps;20 seconds    Lower Trunk Rotation 5 reps;10 seconds    Pelvic Tilt 5 seconds;10 reps    Other Lumbar Stretch Exercise seated flexion 3x, 10 sec, and lateral flexion stretch 3x, 15 sec, RT/LT      Lumbar Exercises: Aerobic   Nustep L3; 6 mins; UEs/LEs      Lumbar Exercises: Supine   Clam Limitations single leg fall out with PPT   cued for breathing x5 RT/LT    Bridge 10 reps   c PPT   Other Supine Lumbar Exercises Hip add isometircs c ball squeeze; 10x; 5 sce    Other Supine Lumbar Exercises Bilat hip abd c Green Tband; Hook lying 15x      Knee/Hip Exercises: Standing   Heel Raises Both;15 reps;2 seconds    Heel Raises Limitations and toe lifts    Hip Flexion Right;Left;10 reps    Hip Flexion Limitations counter assist    Hip Abduction Right;Left;5 sets   Pt completed a few reps and it was DCed due to hip pressure   Abduction Limitations counter assist    Hip Extension Right;Left;10 reps    Extension Limitations counter assist    Other Standing Knee Exercises Side stepping at counter, hands hovering, 69ft x 4                    PT Short Term Goals - 10/23/20 1118      PT SHORT TERM GOAL #1   Title Pt will be Ind in an Initial HEP    Status Achieved             PT Long Term Goals - 11/16/20 0707      PT LONG TERM GOAL #1   Title Pt will be Ind in a final HEP to maintain or progress achieved level of function.      PT LONG TERM GOAL #2   Title Pt will report improvement in her low back and L LE pain consistent to 5 to less with daily activites    Baseline 4-9/10    Status New      PT LONG TERM GOAL #3   Title Pt's SB ROM  L and R will improve to a 25% limitation    Baseline 50% limitation    Status New      PT LONG TERM GOAL #4   Title Pt will report tolerating walking 45 mins with low back and L LE pain not exceeding 5/10 to assist pt in exercise and continued weight loss.    Status New      PT LONG TERM GOAL #5   Title Increase 2MWT to 330 ft c SPC for improved functional mobility    Baseline 287 ft c [facility]    Status New    Target Date 12/07/20  Plan - 11/20/20 0917    Clinical Impression Statement PT was completed lumbopelvic and LE mobility c flexion biasof functional improvement. and for lumbopelvic/hip strengthening/stability. Strengthening was  completed in both the open and CKC. With walking today, pt increased her distance before needing to sit and rest due to back discomfort. Improved function during today's session reflects pt subjective report .    Personal Factors and Comorbidities Time since onset of injury/illness/exacerbation;Comorbidity 2;Comorbidity 1;Comorbidity 3+;Fitness    Comorbidities obesity, DM, anxiety, substance abuse    Examination-Activity Limitations Locomotion Level;Stand;Sleep;Sit    Stability/Clinical Decision Making Evolving/Moderate complexity    Clinical Decision Making Moderate    Rehab Potential Good    PT Frequency 2x / week    PT Duration 6 weeks    PT Treatment/Interventions ADLs/Self Care Home Management;Cryotherapy;Electrical Stimulation;Iontophoresis 4mg /ml Dexamethasone;Moist Heat;Traction;Ultrasound;Therapeutic activities;Therapeutic exercise;Patient/family education;Passive range of motion;Dry needling;Taping;Joint Manipulations    PT Next Visit Plan Assess response to HEP and education. Progress LE/back/core strengthening exs as tolerated.    PT Home Exercise Plan sitting flexion , supine head and shoulder raises.   PPT with hip flexion ,  ball squeeze with PPT .,   supine clam    Consulted and Agree with Plan of Care Patient           Patient will benefit from skilled therapeutic intervention in order to improve the following deficits and impairments:  Decreased range of motion,Obesity,Postural dysfunction,Decreased strength,Improper body mechanics,Pain,Decreased activity tolerance  Visit Diagnosis: Radiculopathy, lumbar region  Muscle weakness (generalized)  Decreased ROM of lumbar spine     Problem List Patient Active Problem List   Diagnosis Date Noted  . Chronic left-sided low back pain with left-sided sciatica 07/22/2017  . Thyroid nodule 04/16/2017  . Essential hypertension 03/13/2014  . Menometrorrhagia   . Dysmenorrhea   . Anemia   . Hyperlipidemia 05/23/2007  .  OBESITY, MORBID 05/23/2007  . DYSFUNCTIONAL UTERINE BLEEDING - s/p LAVH 05/23/2007  . ROTATOR CUFF SYNDROME 05/23/2007  . KNEE PAIN, HX OF 05/23/2007    Gar Ponto MS, PT 11/20/20 10:25 PM  [facility] [address] [place], [place], [address] Phone: [phone]   Fax:  [phone]  Name: [name] MRN: [id number] Date of Birth: 01-May-1961

## 2020-11-22 ENCOUNTER — Ambulatory Visit: Payer: Medicaid Other

## 2020-11-22 ENCOUNTER — Other Ambulatory Visit: Payer: Self-pay

## 2020-11-22 DIAGNOSIS — M5416 Radiculopathy, lumbar region: Secondary | ICD-10-CM | POA: Diagnosis not present

## 2020-11-22 DIAGNOSIS — M5386 Other specified dorsopathies, lumbar region: Secondary | ICD-10-CM

## 2020-11-22 DIAGNOSIS — M6281 Muscle weakness (generalized): Secondary | ICD-10-CM

## 2020-11-22 NOTE — Therapy (Signed)
[place], [place], [address] Phone: [phone]   Fax:  [phone]  Physical Therapy Treatment  Patient Details  Name: [name] MRN: [id number] Date of Birth: [birth year] Referring Provider (PT): Riki Sheer, NP   Encounter Date: 11/22/2020   PT End of Session - 11/22/20 1013    Visit Number 9    Number of Visits 17    Date for PT Re-Evaluation 12/07/20    Authorization Type [place] MEDICAID [place] - Number of Visits 27    Progress Note Due on Visit 10    PT Start Time 1008    PT Stop Time 1050    PT Time Calculation (min) 42 min    Equipment Utilized During Treatment Other (comment)   SPC   Activity Tolerance Patient tolerated treatment well    Behavior During Therapy [facility] for tasks assessed/performed           Past Medical History:  Diagnosis Date  . Anemia   . Anxiety   . Arthritis   . Diabetes mellitus without complication ([place])    recent dx diet controlled  . DVT (deep venous thrombosis) (HCC)    remote - took coumadin  . Dysmenorrhea   . Dysrhythmia    irregular heart beat  . Fibroid   . GERD (gastroesophageal reflux disease)    hx of  . Gout   . Hypercholesteremia   . Hypertension 4 months  . Menometrorrhagia   . Morbid obesity ([place])   . Prediabetes   . Substance abuse [facility])     Past Surgical History:  Procedure Laterality Date  . ABDOMINAL HYSTERECTOMY  2008  . BREAST BIOPSY Left 2013   benign  . BUNIONECTOMY Bilateral   . CHOLECYSTECTOMY    . COLONOSCOPY WITH PROPOFOL N/A 10/27/2018   Procedure: COLONOSCOPY WITH PROPOFOL;  Surgeon: Wonda Horner, MD;  [facility];  Service: Endoscopy;  Laterality: N/A;  . ECTOPIC PREGNANCY SURGERY  1980's  . MANDIBLE OSTEOTOMY Bilateral 07/03/2013   Procedure: BILATERAL TORI;  Surgeon: Gae Bon, DDS;  Location: [place];  Service: Oral Surgery;  Laterality: Bilateral;  .  NM MYOCAR PERF WALL MOTION  07/2012   lexiscan - normal pattern of perfusion, low risk  . ROTATOR CUFF REPAIR Bilateral 2009  . SLEEP STUDY  07/27/2012   AHI 4.8/hr  . TOOTH EXTRACTION N/A 07/03/2013   Procedure: DENTAL EXTRACTION # 20;  Surgeon: Gae Bon, DDS;  Location: [place];  Service: Oral Surgery;  Laterality: N/A;  . TRANSTHORACIC ECHOCARDIOGRAM  07/2012   EF=>55%, mod conc LVH; LA mod dilated; trace MR; mild TR with normal RVSP; trace AV regurg  . TUBAL LIGATION      There were no vitals filed for this visit.   Subjective Assessment - 11/22/20 1015    Subjective Pt reports she is pleased with her progress and course of therapy.    Diagnostic tests 05/08/20-X-ray:Multilevel mild disc space narrowing and anterior spurring; moderate facet arthropathy L4-L5, L5-S1    Patient Stated Goals To continue to decrease her pain.    Currently in Pain? Yes    Pain Score 4     Pain Location Back    Pain Orientation Lower    Pain Descriptors / Indicators Aching    Pain Type Chronic pain    Pain Onset More than a month ago    Pain Frequency Constant    Aggravating Factors  Activity, being on feet    Pain Relieving Factors Meds, PT    Effect of Pain on Daily Activities Moderate impact              [facility] PT Assessment - 11/22/20 0001      AROM   Lumbar - Right Side Bend less than 25% limited    Lumbar - Left Side Bend less than 25% limited      Ambulation/Gait   Gait Comments Walking to address activity tolerance- Pt walked 705 ft c a SPC. Distance was limited by an increase in R h.ip                         [facility] Adult PT Treatment/Exercise - 11/22/20 0001      Lumbar Exercises: Stretches   Active Hamstring Stretch Right;Left;2 reps;20 seconds    Lower Trunk Rotation 5 reps;10 seconds    Pelvic Tilt 5 seconds;10 reps    Other Lumbar Stretch Exercise seated flexion 3x, 10 sec, and lateral flexion stretch 3x, 15 sec, RT/LT      Lumbar Exercises: Aerobic    Nustep L5; 5 mins; UEs/LEs      Lumbar Exercises: Supine   Clam Limitations single leg fall out with PPT  cued for breathing x5 RT/LT    Bridge 10 reps   c PPT   Straight Leg Raise 10 reps    Straight Leg Raises Limitations partial lift, core engaged    Other Supine Lumbar Exercises Bilat hip abd c Green Tband; Hook lying 15x                    PT Short Term Goals - 11/22/20 1036      PT SHORT TERM GOAL #2   Title Pt will voice understanding of measures to assist with the reduction of low back and L LE pain. Pt voices good management of back pain per ther ex, activity management c rest, heat 9pad, cream, shower) and medications    Status Achieved    Target Date 11/22/20             PT Long Term Goals - 11/22/20 1033      PT LONG TERM GOAL #3   Title Pt's SB ROM  L and R will improve to a 25% limitation.    Baseline 50% limitation    Status Achieved                 Plan - 11/22/20 1014    Clinical Impression Statement PT was continued for lumbopelvic strengthening and mobility with pt responding well to flexion biased stretches. Pt met 2 goals: 1 for pain management and the other for trunk ROM. Additionally, pt walking tolerance with a 1 time attempt continues to increase. Pt will benefit from skilled PT for continued strengthening with managed pain to optimize her functional abilites and activity tolerance.    Personal Factors and Comorbidities Time since onset of injury/illness/exacerbation;Comorbidity 2;Comorbidity 1;Comorbidity 3+;Fitness    Comorbidities obesity, DM, anxiety, substance abuse    Examination-Activity Limitations Locomotion Level;Stand;Sleep;Sit    Stability/Clinical Decision Making Evolving/Moderate complexity    Clinical Decision Making Moderate    Rehab Potential Good    PT Frequency 2x / week    PT Duration 6 weeks    PT Treatment/Interventions ADLs/Self Care Home Management;Cryotherapy;Electrical Stimulation;Iontophoresis 56m/ml  Dexamethasone;Moist Heat;Traction;Ultrasound;Therapeutic activities;Therapeutic exercise;Patient/family education;Passive range of motion;Dry needling;Taping;Joint Manipulations    PT Next Visit Plan Progress LE/back/core  strengthening exs as tolerated. Assess 2MWT. Provided education re: proper body mechanics with householf activities.    PT Home Exercise Plan sitting flexion , supine head and shoulder raises.   PPT with hip flexion ,  ball squeeze with PPT .,   supine clam    Consulted and Agree with Plan of Care Patient           Patient will benefit from skilled therapeutic intervention in order to improve the following deficits and impairments:  Decreased range of motion,Obesity,Postural dysfunction,Decreased strength,Improper body mechanics,Pain,Decreased activity tolerance  Visit Diagnosis: Radiculopathy, lumbar region  Muscle weakness (generalized)  Decreased ROM of lumbar spine     Problem List Patient Active Problem List   Diagnosis Date Noted  . Chronic left-sided low back pain with left-sided sciatica 07/22/2017  . Thyroid nodule 04/16/2017  . Essential hypertension 03/13/2014  . Menometrorrhagia   . Dysmenorrhea   . Anemia   . Hyperlipidemia 05/23/2007  . OBESITY, MORBID 05/23/2007  . DYSFUNCTIONAL UTERINE BLEEDING - s/p LAVH 05/23/2007  . ROTATOR CUFF SYNDROME 05/23/2007  . KNEE PAIN, HX OF 05/23/2007    Gar Ponto MS, PT 11/22/20 11:07 AM  [facility] [address] [place], [place], [address] Phone: [phone]   Fax:  [phone]  Name: Caera Enwright MRN: [id number] Date of Birth: Feb 24, 1961

## 2020-11-27 ENCOUNTER — Ambulatory Visit: Payer: Medicaid Other

## 2020-11-29 ENCOUNTER — Ambulatory Visit: Payer: Medicaid Other

## 2020-12-04 ENCOUNTER — Ambulatory Visit: Payer: Medicaid Other

## 2020-12-04 ENCOUNTER — Telehealth: Payer: Self-pay

## 2020-12-04 NOTE — Telephone Encounter (Signed)
Spoke with pt and she stated she was not feeling well and forgot to call and cancel. Reminded pt of her upcoming appt. on 12/06/20.

## 2020-12-06 ENCOUNTER — Ambulatory Visit: Payer: Medicaid Other

## 2020-12-06 ENCOUNTER — Other Ambulatory Visit: Payer: Self-pay

## 2020-12-06 DIAGNOSIS — M5386 Other specified dorsopathies, lumbar region: Secondary | ICD-10-CM

## 2020-12-06 DIAGNOSIS — M5416 Radiculopathy, lumbar region: Secondary | ICD-10-CM

## 2020-12-06 DIAGNOSIS — M6281 Muscle weakness (generalized): Secondary | ICD-10-CM

## 2020-12-06 NOTE — Therapy (Addendum)
[place], [place], [address] Phone: [phone]   Fax:  [phone]  Physical Therapy Treatment/discharge  Patient Details  Name: [name] MRN: [id number] Date of Birth: 05-11-1961 Referring Provider (PT): Riki Sheer, NP   Encounter Date: 12/06/2020   PT End of Session - 12/06/20 1003     Visit Number 10    Number of Visits 17    Date for PT Re-Evaluation 01/10/21    Authorization Type [place] MEDICAID [place] - Number of Visits 27    Progress Note Due on Visit 10    PT Start Time 1001    PT Stop Time 1100    PT Time Calculation (min) 59 min    Equipment Utilized During Treatment Other (comment)   [place]   Activity Tolerance Patient tolerated treatment well    Behavior During Therapy WFL for tasks assessed/performed             Past Medical History:  Diagnosis Date   Anemia    Anxiety    Arthritis    Diabetes mellitus without complication ([place])    recent dx diet controlled   DVT (deep venous thrombosis) ([place])    remote - took coumadin   Dysmenorrhea    Dysrhythmia    irregular heart beat   Fibroid    GERD (gastroesophageal reflux disease)    hx of   Gout    Hypercholesteremia    Hypertension 4 months   Menometrorrhagia    Morbid obesity ([place])    Prediabetes    Substance abuse ([place])     Past Surgical History:  Procedure Laterality Date   ABDOMINAL HYSTERECTOMY  2008   BREAST BIOPSY Left 2013   benign   BUNIONECTOMY Bilateral    CHOLECYSTECTOMY     COLONOSCOPY WITH PROPOFOL N/A 10/27/2018   Procedure: COLONOSCOPY WITH PROPOFOL;  Surgeon: Wonda Horner, MD;  Location: [facility];  Service: Endoscopy;  Laterality: N/A;   ECTOPIC PREGNANCY SURGERY  1980's   MANDIBLE OSTEOTOMY Bilateral 07/03/2013   Procedure: BILATERAL TORI;  Surgeon: Gae Bon, DDS;  Location: [facility];  Service: Oral Surgery;  Laterality: Bilateral;   NM  MYOCAR PERF WALL MOTION  07/2012   lexiscan - normal pattern of perfusion, low risk   ROTATOR CUFF REPAIR Bilateral 2009   SLEEP STUDY  07/27/2012   AHI 4.8/hr   TOOTH EXTRACTION N/A 07/03/2013   Procedure: DENTAL EXTRACTION # 20;  Surgeon: Gae Bon, DDS;  Location: [facility];  Service: Oral Surgery;  Laterality: N/A;   TRANSTHORACIC ECHOCARDIOGRAM  07/2012   EF=>55%, mod conc LVH; LA mod dilated; trace MR; mild TR with normal RVSP; trace AV regurg   TUBAL LIGATION      There were no vitals filed for this visit.   Subjective Assessment - 12/06/20 1005     Subjective Pt reports she has been having more low back and hip pain, especially on the R side. Pt the increase may be related to the recent rainy weather or caring for her 5 grandsons, ages 90-12. last weekend    Diagnostic tests 05/08/20-X-ray:Multilevel mild disc space narrowing and anterior spurring; moderate facet arthropathy L4-L5, L5-S1    Patient Stated Goals To continue to decrease her pain.    Currently in Pain? Yes    Pain Score 5     Pain Location Back    Pain Orientation Right;Left;Lower;Posterior    Pain Descriptors /  Indicators Aching    Pain Type Chronic pain    Pain Onset More than a month ago    Pain Frequency Constant    Aggravating Factors  Activity, being on feet    Pain Relieving Factors Meds, PT    Effect of Pain on Daily Activities Moderate impact    Multiple Pain Sites No                               [facility] Adult PT Treatment/Exercise - 12/06/20 0001       Exercises   Exercises Lumbar;Knee/Hip      Lumbar Exercises: Stretches   Active Hamstring Stretch Right;Left;20 seconds;3 reps    Lower Trunk Rotation 5 reps   5 sec   Pelvic Tilt 5 seconds;10 reps    Other Lumbar Stretch Exercise seated flexion 10x, 5 sec      Lumbar Exercises: Standing   Row Both;15 reps   2 sets   Theraband Level (Row) Level 2 (Red)    Shoulder Extension Both;15 reps   2 sets   Theraband Level  (Shoulder Extension) Level 2 (Red)      Knee/Hip Exercises: Standing   Heel Raises Both;15 reps;2 seconds    Heel Raises Limitations and toe lifts    Hip Flexion Right;Left;10 reps    Hip Flexion Limitations counter assist    Other Standing Knee Exercises Side stepping at counter, hands hovering, 24f x 4      Modalities   Modalities Moist Heat      Moist Heat Therapy   Number Minutes Moist Heat 15 Minutes    Moist Heat Location Lumbar Spine                      PT Short Term Goals - 11/22/20 1036       PT SHORT TERM GOAL #2   Title Pt will voice understanding of measures to assist with the reduction of low back and L LE pain. Pt voices good management of back pain per ther ex, activity management c rest, heat 9pad, cream, shower) and medications    Status Achieved    Target Date 11/22/20               PT Long Term Goals - 12/06/20 1623       PT LONG TERM GOAL #1   Title Pt will be Ind in a final HEP to maintain or progress achieved level of function.    Status On-going    Target Date 01/10/21      PT LONG TERM GOAL #2   Title Pt will report improvement in her low back and L LE pain consistent to 5 to less with daily activites    Baseline 4-9/10    Status On-going    Target Date 01/10/21      PT LONG TERM GOAL #3   Title Pt's SB ROM  L and R will improve to a 25% limitation.    Baseline 50% limitation    Status Achieved    Target Date 12/07/20      PT LONG TERM GOAL #4   Title Pt will report tolerating walking 45 mins with low back and L LE pain not exceeding 5/10 to assist pt in exercise and continued weight loss.    Status On-going    Target Date 01/10/21      PT LONG TERM GOAL #5   Title  Increase 2MWT to 330 ft c SPC for improved functional mobility    Baseline 287 ft c [facility]    Status On-going    Target Date 01/10/21                   Plan - 12/06/20 1618     Clinical Impression Statement Pt particiapted in PT for lumbopelvic  flexibility and strengthening. Supine exs was limited to a few exs with pt reporting lying supine being uncomfortable.Remaining ther exs were completed in standing with pt reporting good tolerance. Moist heat was provided at the end of the session to assist with pain reduction. Overall, pt has made progress with pain and function with a recent decline this past week. Pt will benefit from skilled PT 2w3 for back care/body mechanics Ed and progression of ther ex to optimize pain reduction and management and function.    Personal Factors and Comorbidities Time since onset of injury/illness/exacerbation;Comorbidity 2;Comorbidity 1;Comorbidity 3+;Fitness    Comorbidities obesity, DM, anxiety, substance abuse    Examination-Activity Limitations Locomotion Level;Stand;Sleep;Sit    Stability/Clinical Decision Making Evolving/Moderate complexity    Clinical Decision Making Moderate    Rehab Potential Good    PT Frequency 2x / week    PT Duration 3 weeks    PT Treatment/Interventions ADLs/Self Care Home Management;Cryotherapy;Electrical Stimulation;Iontophoresis 66m/ml Dexamethasone;Moist Heat;Traction;Ultrasound;Therapeutic activities;Therapeutic exercise;Patient/family education;Passive range of motion;Dry needling;Taping;Joint Manipulations    PT Next Visit Plan Progress LE/back/core strengthening exs as tolerated. Assess 2MWT. Provided education re: proper body mechanics with household activities.    PT Home Exercise Plan sitting flexion , supine head and shoulder raises.   PPT with hip flexion ,  ball squeeze with PPT .,   supine clam    Consulted and Agree with Plan of Care Patient             Patient will benefit from skilled therapeutic intervention in order to improve the following deficits and impairments:  Decreased range of motion,Obesity,Postural dysfunction,Decreased strength,Improper body mechanics,Pain,Decreased activity tolerance  Visit Diagnosis: Radiculopathy, lumbar region  Muscle  weakness (generalized)  Decreased ROM of lumbar spine     Problem List Patient Active Problem List   Diagnosis Date Noted   Chronic left-sided low back pain with left-sided sciatica 07/22/2017   Thyroid nodule 04/16/2017   Essential hypertension 03/13/2014   Menometrorrhagia    Dysmenorrhea    Anemia    Hyperlipidemia 05/23/2007   OBESITY, MORBID 05/23/2007   DYSFUNCTIONAL UTERINE BLEEDING - s/p LAVH 05/23/2007   ROTATOR CUFF SYNDROME 05/23/2007   KNEE PAIN, HX OF 05/23/2007   AGar PontoMS, PT 12/06/20 4:39 PM  [email or web address] ([place])[address] [address]Phone: [phone]  Fax:  [phone] Name: VMarlyss CissellMRN: [id number] of Birth: 904/26/1962 PHYSICAL THERAPY DISCHARGE SUMMARY  Visits from Start of Care: 10  Current functional level related to goals / functional outcomes See above   Remaining deficits: See above   Education / Equipment: HEP  Patient agrees to discharge. Patient goals were partially met. Patient is being discharged due to not returning since the last visit.   Tonya Arenz MS, PT 05/15/21 1:16 PM

## 2020-12-09 ENCOUNTER — Ambulatory Visit: Payer: Medicaid Other

## 2021-01-27 ENCOUNTER — Other Ambulatory Visit: Payer: Self-pay

## 2021-01-27 ENCOUNTER — Ambulatory Visit
Admission: RE | Admit: 2021-01-27 | Discharge: 2021-01-27 | Disposition: A | Discharge status: Home / Self Care | Payer: Medicaid Other | Source: Ambulatory Visit | Attending: Nurse Practitioner | Admitting: Nurse Practitioner

## 2021-01-27 DIAGNOSIS — Z1231 Encounter for screening mammogram for malignant neoplasm of breast: Secondary | ICD-10-CM

## 2021-02-02 ENCOUNTER — Other Ambulatory Visit: Payer: Self-pay

## 2021-02-02 ENCOUNTER — Emergency Department (HOSPITAL_COMMUNITY): Payer: Medicaid Other

## 2021-02-02 ENCOUNTER — Emergency Department (HOSPITAL_COMMUNITY)
Admission: EM | Admit: 2021-02-02 | Discharge: 2021-02-02 | Disposition: A | Discharge status: Home / Self Care | Payer: Medicaid Other | Attending: Emergency Medicine | Admitting: Emergency Medicine

## 2021-02-02 ENCOUNTER — Encounter (HOSPITAL_COMMUNITY): Payer: Self-pay

## 2021-02-02 DIAGNOSIS — W010XXA Fall on same level from slipping, tripping and stumbling without subsequent striking against object, initial encounter: Secondary | ICD-10-CM | POA: Insufficient documentation

## 2021-02-02 DIAGNOSIS — Z7982 Long term (current) use of aspirin: Secondary | ICD-10-CM | POA: Diagnosis not present

## 2021-02-02 DIAGNOSIS — Z87891 Personal history of nicotine dependence: Secondary | ICD-10-CM | POA: Insufficient documentation

## 2021-02-02 DIAGNOSIS — M25551 Pain in right hip: Secondary | ICD-10-CM | POA: Insufficient documentation

## 2021-02-02 DIAGNOSIS — M549 Dorsalgia, unspecified: Secondary | ICD-10-CM | POA: Diagnosis not present

## 2021-02-02 DIAGNOSIS — Z79899 Other long term (current) drug therapy: Secondary | ICD-10-CM | POA: Diagnosis not present

## 2021-02-02 DIAGNOSIS — W19XXXA Unspecified fall, initial encounter: Secondary | ICD-10-CM

## 2021-02-02 DIAGNOSIS — I10 Essential (primary) hypertension: Secondary | ICD-10-CM | POA: Diagnosis not present

## 2021-02-02 DIAGNOSIS — M25522 Pain in left elbow: Secondary | ICD-10-CM | POA: Diagnosis not present

## 2021-02-02 DIAGNOSIS — R109 Unspecified abdominal pain: Secondary | ICD-10-CM | POA: Diagnosis not present

## 2021-02-02 DIAGNOSIS — R519 Headache, unspecified: Secondary | ICD-10-CM | POA: Diagnosis not present

## 2021-02-02 DIAGNOSIS — E119 Type 2 diabetes mellitus without complications: Secondary | ICD-10-CM | POA: Insufficient documentation

## 2021-02-02 DIAGNOSIS — Y92512 Supermarket, store or market as the place of occurrence of the external cause: Secondary | ICD-10-CM | POA: Diagnosis not present

## 2021-02-02 DIAGNOSIS — R609 Edema, unspecified: Secondary | ICD-10-CM | POA: Insufficient documentation

## 2021-02-02 LAB — CBC
HCT: 37 % (ref 36.0–46.0)
Hemoglobin: 12.4 g/dL (ref 12.0–15.0)
MCH: 29.7 pg (ref 26.0–34.0)
MCHC: 33.5 g/dL (ref 30.0–36.0)
MCV: 88.5 fL (ref 80.0–100.0)
Platelets: 254 10*3/uL (ref 150–400)
RBC: 4.18 MIL/uL (ref 3.87–5.11)
RDW: 14.1 % (ref 11.5–15.5)
WBC: 6.8 10*3/uL (ref 4.0–10.5)
nRBC: 0 % (ref 0.0–0.2)

## 2021-02-02 LAB — COMPREHENSIVE METABOLIC PANEL
ALT: 32 U/L (ref 0–44)
AST: 46 U/L — ABNORMAL HIGH (ref 15–41)
Albumin: 3.7 g/dL (ref 3.5–5.0)
Alkaline Phosphatase: 47 U/L (ref 38–126)
Anion gap: 5 (ref 5–15)
BUN: 12 mg/dL (ref 6–20)
CO2: 26 mmol/L (ref 22–32)
Calcium: 8.2 mg/dL — ABNORMAL LOW (ref 8.9–10.3)
Chloride: 109 mmol/L (ref 98–111)
Creatinine, Ser: 0.95 mg/dL (ref 0.44–1.00)
GFR, Estimated: >60 mL/min (ref >60)
Glucose, Bld: 94 mg/dL (ref 70–99)
Potassium: 4.4 mmol/L (ref 3.5–5.1)
Sodium: 140 mmol/L (ref 135–145)
Total Bilirubin: 0.7 mg/dL (ref 0.3–1.2)
Total Protein: 7.4 g/dL (ref 6.5–8.1)

## 2021-02-02 MED ORDER — MORPHINE SULFATE (PF) 2 MG/ML IV SOLN
2.0000 mg | Freq: Once | INTRAVENOUS | Status: AC
  Administered 2021-02-02: 2 mg via INTRAVENOUS
  Filled 2021-02-02: qty 1

## 2021-02-02 MED ORDER — IOHEXOL 300 MG/ML  SOLN
100.0000 mL | Freq: Once | INTRAMUSCULAR | Status: AC | PRN
  Administered 2021-02-02: 100 mL via INTRAVENOUS

## 2021-02-02 MED ORDER — FENTANYL CITRATE (PF) 100 MCG/2ML IJ SOLN
100.0000 ug | Freq: Once | INTRAMUSCULAR | Status: AC
  Administered 2021-02-02: 100 ug via INTRAVENOUS
  Filled 2021-02-02: qty 2

## 2021-02-02 NOTE — Discharge Instructions (Signed)
Please take your pain medicine as directed.  Make sure to follow-up with your primary care doctor.  Return to the ER for any new or worsening symptoms.

## 2021-02-02 NOTE — ED Triage Notes (Signed)
Per EMS, pt is from [employer] in [place] and fell. Slipped in some water and fell in a split formation. Hit her head on the left side on a cart. No LOC, no blood thinners. States she has pain to left elbow and right hip.  EMS gave 200 fentanyl , 20 RT AC. 130/84 CBG 119 HR 70 Resp 100 % RA Pain 10/10 RR 24

## 2021-02-02 NOTE — ED Provider Notes (Signed)
[facility] DEPT Provider Note   CSN: [id number] Arrival date & time: 02/02/21  1408     History No chief complaint on file.   [name] is a 60 y.o. female.  HPI 60 year old female with history of DM type II, remote DVT, GERD, hypertension, hyperglycemia, substance abuse presents to the ER with complaints of fall.  Patient states that she was at the [employer] in [place], slipped on some water and fell in a split formation and onto her back.  Reports hitting the left side of her head on a car.  No LOC.  Reports severe pain to her right hip, back, left elbow and has a headache.  Patient arrived in c-collar.  No loss of bowel bladder control.  No numbness or tingling.    Past Medical History:  Diagnosis Date  . Anemia   . Anxiety   . Arthritis   . Diabetes mellitus without complication ([place])    recent dx diet controlled  . DVT (deep venous thrombosis) ([facility])    remote - took coumadin  . Dysmenorrhea   . Dysrhythmia    irregular heart beat  . Fibroid   . GERD (gastroesophageal reflux disease)    hx of  . Gout   . Hypercholesteremia   . Hypertension 4 months  . Menometrorrhagia   . Morbid obesity ([place])   . Prediabetes   . Substance abuse [facility])     Patient Active Problem List   Diagnosis Date Noted  . Chronic left-sided low back pain with left-sided sciatica 07/22/2017  . Thyroid nodule 04/16/2017  . Essential hypertension 03/13/2014  . Menometrorrhagia   . Dysmenorrhea   . Anemia   . Hyperlipidemia 05/23/2007  . OBESITY, MORBID 05/23/2007  . DYSFUNCTIONAL UTERINE BLEEDING - s/p LAVH 05/23/2007  . ROTATOR CUFF SYNDROME 05/23/2007  . KNEE PAIN, HX OF 05/23/2007    Past Surgical History:  Procedure Laterality Date  . ABDOMINAL HYSTERECTOMY  2008  . BREAST BIOPSY Left 2013   benign  . BUNIONECTOMY Bilateral   . CHOLECYSTECTOMY    . COLONOSCOPY WITH PROPOFOL N/A 10/27/2018   Procedure: COLONOSCOPY WITH PROPOFOL;   Surgeon: Wonda Horner, MD;  Location: [facility];  Service: Endoscopy;  Laterality: N/A;  . ECTOPIC PREGNANCY SURGERY  1980's  . MANDIBLE OSTEOTOMY Bilateral 07/03/2013   Procedure: BILATERAL TORI;  Surgeon: Gae Bon, DDS;  Location: [place];  Service: Oral Surgery;  Laterality: Bilateral;  . NM MYOCAR PERF WALL MOTION  07/2012   lexiscan - normal pattern of perfusion, low risk  . ROTATOR CUFF REPAIR Bilateral 2009  . SLEEP STUDY  07/27/2012   AHI 4.8/hr  . TOOTH EXTRACTION N/A 07/03/2013   Procedure: DENTAL EXTRACTION # 20;  Surgeon: Gae Bon, DDS;  Location: [place];  Service: Oral Surgery;  Laterality: N/A;  . TRANSTHORACIC ECHOCARDIOGRAM  07/2012   EF=>55%, mod conc LVH; LA mod dilated; trace MR; mild TR with normal RVSP; trace AV regurg  . TUBAL LIGATION       OB History    Gravida  8   Para  6   Term      Preterm      AB  1   Living        SAB      IAB      Ectopic  1   Multiple      Live Births  Family History  Problem Relation Age of Onset  . Diabetes Mother   . Hypertension Mother   . Diabetes Father   . Hypertension Father   . Cancer Father 39  . Heart disease Other        No family history  . Cancer Brother 76    Social History   Tobacco Use  . Smoking status: Former Smoker    Packs/day: 0.10    Years: 6.00    Pack years: 0.60    Types: Cigarettes    Quit date: 02/14/2011    Years since quitting: 9.9  . Smokeless tobacco: Never Used  Vaping Use  . Vaping Use: Never used  Substance Use Topics  . Alcohol use: No    Comment: Previous ETOH abuse  . Drug use: No    Comment: Previous crack use     Home Medications Prior to Admission medications   Medication Sig Start Date End Date Taking? Authorizing Provider  allopurinol (ZYLOPRIM) 100 MG tablet Take 100 mg by mouth daily.    [provider]  aspirin 81 MG chewable tablet Chew 81 mg by mouth daily.    [provider]  atorvastatin (LIPITOR)  40 MG tablet Take 40 mg by mouth daily.    [provider]  Blood Glucose Monitoring Suppl (ACCU-CHEK GUIDE ME) w/Device KIT  09/09/20   [provider]  cholecalciferol (VITAMIN D3) 25 MCG (1000 UT) tablet Take 2,000 Units by mouth daily.    [provider]  [employer] MAGNESIUM OXIDE 250 MG TABS Take 1 tablet by mouth daily. 09/03/20   [provider]  cyclobenzaprine (FLEXERIL) 10 MG tablet Take 10 mg by mouth 2 (two) times daily as needed for muscle spasms.     [provider]  cyclobenzaprine (FLEXERIL) 5 MG tablet Take 5 mg by mouth 2 (two) times daily. 05/30/20   [provider]  diclofenac sodium (VOLTAREN) 1 % GEL Apply 4 g topically 4 (four) times daily. 12/22/17   Maczis, Barth Kirks, PA-C  Diethylpropion HCl CR 75 MG TB24 Take 1 tablet by mouth daily. 11/01/20   [provider]  furosemide (LASIX) 20 MG tablet Take 20 mg by mouth daily.    [provider]  gabapentin (NEURONTIN) 300 MG capsule Take 300 mg by mouth 3 (three) times daily.  Patient not taking: No sig reported    [provider]  gabapentin (NEURONTIN) 400 MG capsule Take 400 mg by mouth 3 (three) times daily. 09/04/20   [provider]  glucose blood test strip TEST GLUCOSE TWICE A DAY 04/05/17   [provider]  lidocaine (XYLOCAINE) 5 % ointment Apply 1 application topically as needed for moderate pain.    [provider]  loratadine (CLARITIN) 10 MG tablet Take 10 mg by mouth daily.    [provider]  MELATONIN PO Take by mouth at bedtime as needed.    [provider]  meloxicam (MOBIC) 15 MG tablet Take 15 mg by mouth daily.  04/05/17   [provider]  metoprolol succinate (TOPROL-XL) 50 MG 24 hr tablet Take 50 mg by mouth daily. Take with or immediately following a meal.    [provider]  Morphine Sulfate ER 30 MG T12A Take 30 mg by mouth every 12 (twelve) hours.    [provider]  naloxegol oxalate (MOVANTIK) 25 MG TABS tablet Take 25 mg by mouth daily.  04/02/17   [provider]  NARCAN 4 MG/0.1ML  LIQD nasal spray kit SMARTSIG:1 Spray(s) Both Nares Once PRN 04/08/20   [provider]  oxyCODONE-acetaminophen (PERCOCET) 10-325 MG tablet Take 1 tablet by mouth 4 (four) times daily.     [provider]  OZEMPIC, 0.25 OR 0.5 MG/DOSE, 2 MG/1.5ML SOPN Inject into the skin. 07/16/20   [provider]  polyethylene glycol (MIRALAX / GLYCOLAX) packet Take 17 g by mouth daily as needed for moderate constipation.    [provider]  potassium chloride SA (K-DUR,KLOR-CON) 20 MEQ tablet Take 20 mEq by mouth daily.    [provider]  ROGAINE MENS EXTRA STRENGTH 5 % FOAM Apply 1 application topically daily. 06/21/20   [provider]  Vitamin D, Ergocalciferol, (DRISDOL) 1.25 MG (50000 UNIT) CAPS capsule Take 50,000 Units by mouth once a week. 09/06/20   [provider]    Allergies    Buprenorphine hcl-naloxone hcl, Naloxone, and Dilaudid [hydromorphone hcl]  Review of Systems   Review of Systems  Constitutional: Negative for chills and fever.  HENT: Negative for ear pain and sore throat.   Eyes: Negative for pain and visual disturbance.  Respiratory: Negative for cough and shortness of breath.   Cardiovascular: Negative for chest pain and palpitations.  Gastrointestinal: Negative for abdominal pain and vomiting.  Genitourinary: Negative for dysuria and hematuria.  Musculoskeletal: Positive for back pain, gait problem and neck pain. Negative for arthralgias.  Skin: Negative for color change and rash.  Neurological: Negative for seizures, syncope, weakness and numbness.  All other systems reviewed and are negative.   Physical Exam Updated Vital Signs BP 125/82 (BP Location: Left Arm)   Pulse 70   Temp 98.7 F (37.1 C) (Oral)   Resp 19   Ht 5' 6.5" (1.689 m)   Wt (!) 137.4 kg   SpO2  99%   BMI 48.17 kg/m   Physical Exam Vitals and nursing note reviewed.  Constitutional:      General: She is not in acute distress.    Appearance: She is well-developed.  HENT:     Head: Normocephalic and atraumatic.  Eyes:     Conjunctiva/sclera: Conjunctivae normal.  Cardiovascular:     Rate and Rhythm: Normal rate and regular rhythm.     Heart sounds: No murmur heard.   Pulmonary:     Effort: Pulmonary effort is normal. No respiratory distress.     Breath sounds: Normal breath sounds.  Abdominal:     Palpations: Abdomen is soft.     Tenderness: There is abdominal tenderness.  Musculoskeletal:        General: Tenderness present.     Cervical back: Neck supple.     Right lower leg: No edema.     Left lower leg: Edema present.     Comments: Midline tenderness to the C, T, L-spine.  Right hip range of motion limited secondary to pain, though she has full passive range of motion.  No evidence of leg shortening.  Neurovascularly intact.  Full flexion extension of the left elbow.  Skin:    General: Skin is warm and dry.     Comments: No bruising to the chest, abdomen or pelvis.  Neurological:     Mental Status: She is alert.     ED Results / Procedures / Treatments   Labs (all labs ordered are listed, but only abnormal results are displayed) Labs Reviewed  COMPREHENSIVE METABOLIC PANEL - Abnormal; Notable for the following components:      Result Value   Calcium  8.2 (*)    AST 46 (*)    All other components within normal limits  CBC    EKG None  Radiology DG Thoracic Spine 2 View  Result Date: 02/02/2021 CLINICAL DATA:  Back pain after fall EXAM: THORACIC SPINE 2 VIEWS COMPARISON:  09/07/2014 FINDINGS: There is no evidence of thoracic spine fracture. Alignment is normal. Mild multilevel intervertebral disc height loss with endplate spurring throughout the mid to lower thoracic spine. IMPRESSION: 1. No acute fracture or malalignment of the thoracic spine. 2. Mild  degenerative disc disease. Electronically Signed   By: Davina Poke D.O.   On: 02/02/2021 15:25   DG Lumbar Spine Complete  Result Date: 02/02/2021 CLINICAL DATA:  Back pain after fall EXAM: LUMBAR SPINE - COMPLETE 4+ VIEW COMPARISON:  07/22/2017 FINDINGS: Five lumbar type vertebral segments. Vertebral body heights and alignment are maintained. No fracture identified. Multilevel intervertebral disc space loss with associated degenerative endplate changes. Lower lumbar facet arthrosis. IMPRESSION: 1. No acute fracture or static listhesis. 2. Mild-moderate multilevel lumbar spondylosis. Electronically Signed   By: Davina Poke D.O.   On: 02/02/2021 15:26   DG Elbow Complete Left  Result Date: 02/02/2021 CLINICAL DATA:  Left elbow pain after fall EXAM: LEFT ELBOW - COMPLETE 3+ VIEW COMPARISON:  None. FINDINGS: There is no evidence of fracture, dislocation, or joint effusion. There is no evidence of arthropathy or other focal bone abnormality. Soft tissues are unremarkable. IMPRESSION: Negative. Electronically Signed   By: Davina Poke D.O.   On: 02/02/2021 15:27   CT Head Wo Contrast  Result Date: 02/02/2021 CLINICAL DATA:  Fall from standing with head and neck trauma/pain. EXAM: CT HEAD WITHOUT CONTRAST CT CERVICAL SPINE WITHOUT CONTRAST TECHNIQUE: Multidetector CT imaging of the head and cervical spine was performed following the standard protocol without intravenous contrast. Multiplanar CT image reconstructions of the cervical spine were also generated. COMPARISON:  None. FINDINGS: CT HEAD FINDINGS Brain: No evidence of acute infarction, hemorrhage, hydrocephalus, extra-axial collection or mass lesion/mass effect. Vascular: No hyperdense vessel or unexpected calcification. Skull: Normal. Negative for fracture or focal lesion. Sinuses/Orbits: No acute finding. Other: None. CT CERVICAL SPINE FINDINGS Alignment: Normal. Skull base and vertebrae: No acute fracture. No primary bone lesion or  focal pathologic process. Soft tissues and spinal canal: No prevertebral fluid or swelling. No visible canal hematoma. Disc levels: Mild to moderate multilevel degenerative disc and joint disease. Upper chest: Negative. Other: None. IMPRESSION: 1. No acute intracranial process. 2. No osseous injury in the cervical spine. Electronically Signed   By: Zerita Boers M.D.   On: 02/02/2021 15:42   CT Cervical Spine Wo Contrast  Result Date: 02/02/2021 CLINICAL DATA:  Fall from standing with head and neck trauma/pain. EXAM: CT HEAD WITHOUT CONTRAST CT CERVICAL SPINE WITHOUT CONTRAST TECHNIQUE: Multidetector CT imaging of the head and cervical spine was performed following the standard protocol without intravenous contrast. Multiplanar CT image reconstructions of the cervical spine were also generated. COMPARISON:  None. FINDINGS: CT HEAD FINDINGS Brain: No evidence of acute infarction, hemorrhage, hydrocephalus, extra-axial collection or mass lesion/mass effect. Vascular: No hyperdense vessel or unexpected calcification. Skull: Normal. Negative for fracture or focal lesion. Sinuses/Orbits: No acute finding. Other: None. CT CERVICAL SPINE FINDINGS Alignment: Normal. Skull base and vertebrae: No acute fracture. No primary bone lesion or focal pathologic process. Soft tissues and spinal canal: No prevertebral fluid or swelling. No visible canal hematoma. Disc levels: Mild to moderate multilevel degenerative disc and joint disease. Upper chest: Negative. Other:  None. IMPRESSION: 1. No acute intracranial process. 2. No osseous injury in the cervical spine. Electronically Signed   By: Zerita Boers M.D.   On: 02/02/2021 15:42   CT ABDOMEN PELVIS W CONTRAST  Result Date: 02/02/2021 CLINICAL DATA:  Fall at the grocery store. Right-sided abdominal pain. Elevated LFTs. EXAM: CT ABDOMEN AND PELVIS WITH CONTRAST TECHNIQUE: Multidetector CT imaging of the abdomen and pelvis was performed using the standard protocol following  bolus administration of intravenous contrast. CONTRAST:  110m OMNIPAQUE IOHEXOL 300 MG/ML  SOLN COMPARISON:  Lumbar radiographs earlier today. No prior abdominopelvic imaging. FINDINGS: Lower chest: Mild hypoventilatory changes in both lung bases. No basilar pleural effusion or pneumothorax. Heart is normal in size. Hepatobiliary: No hepatic injury or perihepatic hematoma. Liver is enlarged spanning 20 cm cranial caudal. No focal liver lesion. Cholecystectomy without biliary dilatation. Pancreas: No evidence of injury. No ductal dilatation or inflammation. Spleen: No splenic injury or perisplenic hematoma. Adrenals/Urinary Tract: No adrenal hemorrhage or renal injury identified. Small bilateral renal cortical hypodensities that are too small to accurately characterize but likely cyst. Bladder is unremarkable. Stomach/Bowel: No evidence of injury or acute finding. Tiny hiatal hernia. No bowel obstruction, wall thickening or inflammation. Normal appendix. Moderate volume of stool throughout the colon. Diverticulosis involving distal descending and sigmoid colon without acute diverticulitis. Sigmoid colon is redundant. Vascular/Lymphatic: No acute vascular injury. Normal caliber abdominal aorta and IVC. Minimal aorto bi-iliac atherosclerosis. No retroperitoneal fluid. Patent portal vein. No adenopathy. Reproductive: Status post hysterectomy. No adnexal masses. Right ovary tentatively visualized and quiescent. Left ovary not seen. Other: No free air, free fluid, or intra-abdominal fluid collection. Tiny fat containing umbilical hernia. No confluent subcutaneous contusion. Portions of abdominal wall soft tissues are excluded from the field of view due to habitus. Musculoskeletal: No fracture of included lower ribs, lower thoracic and lumbar spine, or pelvis. Incidental bone island in right superior pubic ramus. Multilevel degenerative change in the thoracolumbar spine IMPRESSION: 1. No acute traumatic injury to the  abdomen or pelvis. 2. Hepatomegaly. 3. Colonic diverticulosis without acute inflammation. Aortic Atherosclerosis (ICD10-I70.0). Electronically Signed   By: MKeith RakeM.D.   On: 02/02/2021 16:45   DG Hip Unilat W or Wo Pelvis 2-3 Views Right  Result Date: 02/02/2021 CLINICAL DATA:  Fall EXAM: DG HIP (WITH OR WITHOUT PELVIS) 2-3V RIGHT COMPARISON:  Pelvis radiograph dated 03/31/2018. FINDINGS: There is no evidence of hip fracture or dislocation. Mild degenerative changes are seen in the spine and bilateral hips. IMPRESSION: No acute osseous injury. Electronically Signed   By: TZerita BoersM.D.   On: 02/02/2021 15:30    Procedures Procedures   Medications Ordered in ED Medications  fentaNYL (SUBLIMAZE) injection 100 mcg (100 mcg Intravenous Given 02/02/21 1449)  morphine 2 MG/ML injection 2 mg (2 mg Intravenous Given 02/02/21 1604)  iohexol (OMNIPAQUE) 300 MG/ML solution 100 mL (100 mLs Intravenous Contrast Given 02/02/21 1615)    ED Course  I have reviewed the triage vital signs and the nursing notes.  Pertinent labs & imaging results that were available during my care of the patient were reviewed by me and considered in my medical decision making (see chart for details).    MDM Rules/Calculators/A&P                          60year old female presents after a fall.  She has multiple complaints including headache, back pain, right hip pain, left elbow pain.  No visible signs  of trauma or deformities on exam.  She does have midline tenderness to the C, T, L-spine, and some mild abdominal tenderness with limited active range of motion of the right hip.  Plain films of the lumbar and thoracic spine without acute abnormalities, elbow x-ray normal.  Right hip x-rays without acute fractures or dislocations.  CT of the head and neck overall reassuring.  Patient's labs with largely no acute abnormalities, though she does have a mild elevation of her AST.  I discussed this with the patient,  she does state that she has had a history of this in the past, was told that she is borderline fatty liver disease.  She does have some right upper quadrant tenderness on exam, and after shared decision conversation she did want to proceed with a CT of the abdomen.  Patient was given fentanyl here in the ER as well as morphine with improvement in her pain.  CT of the abdomen without evidence of liver laceration.  Patient was educated on likely experiencing aches and pains.  She is already on chronic morphine and Percocet.  Encouraged PCP follow-up.  Return precautions discussed.  She was understanding is agreeable.  Stable for discharge. Final Clinical Impression(s) / ED Diagnoses Final diagnoses:  Fall, initial encounter    Rx / DC Orders ED Discharge Orders    None       Lyndel Safe 02/02/21 1649    Quintella Reichert, MD 02/02/21 1949

## 2021-02-02 NOTE — ED Notes (Signed)
Daughter at bedside, pt resting, respirations even and unlabored

## 2021-02-06 ENCOUNTER — Other Ambulatory Visit: Payer: Self-pay

## 2021-02-06 ENCOUNTER — Ambulatory Visit: Payer: Medicaid Other | Admitting: Orthopaedic Surgery

## 2021-02-06 ENCOUNTER — Encounter: Payer: Self-pay | Admitting: Orthopaedic Surgery

## 2021-02-06 VITALS — Ht 66.5 in | Wt 304.0 lb

## 2021-02-06 DIAGNOSIS — G8929 Other chronic pain: Secondary | ICD-10-CM

## 2021-02-06 DIAGNOSIS — M5442 Lumbago with sciatica, left side: Secondary | ICD-10-CM

## 2021-02-06 NOTE — Progress Notes (Signed)
Office Visit Note   Patient: [name]           Date of Birth: 1960/10/27           MRN: [id number] Visit Date: 02/06/2021              Requested by: Riki Sheer, NP [address] [place],  [place] [address] PCP: Riki Sheer, NP   Assessment & Plan: Visit Diagnoses:  1. Chronic left-sided low back pain with left-sided sciatica     Plan: [name] is here for follow-up evaluation of injuries she sustained in a fall at a [employer] on Sunday, 02/02/2021.  She apparently had slipped on a wet spot landing on her back hitting her head and her left elbow.  She was evaluated at [facility].  Studies included a CT of the abdomen and pelvis without any intra-abdominal pathology or any evidence of a lower thoracic or lumbar fracture.  She had a CT scan of her cervical spine without fracture or acute changes and a CT scan of her head that was also without obvious pathology.  Films of her left elbow and pelvis were negative for any fracture as well as films of her lumbar spine.  She is involved in a pain clinic and is already on Percocet and occasional morphine.  She has muscle relaxants at home.  She has been using a walker as she is still having some back pain and right-sided low back discomfort.  She is not having any numbness or tingling or referred pain.  She does not have any headaches or issues with either upper extremity.  The did not appear to have any complications from her fall but elected to check her back in 2 weeks.  No new medicines.  Should just gradually wean herself from the walker.  Consider further diagnostic studies depending upon her exam when she returns.  She is encouraged to call or return if she has an exacerbation of any pain  Follow-Up Instructions: Return in about 2 weeks (around 02/20/2021).   Orders:  No orders of the defined types were placed in this encounter.  No orders of the defined types were placed in this encounter.      Procedures: No procedures performed   Clinical Data: No additional findings.   Subjective: Chief Complaint  Patient presents with  . Lower Back - Pain    DOI 02/02/2021  . Left Elbow - Pain    DOI 02/02/2021  Patient presents today for left elbow and right lower back pain. She said that she slipped and fell on 02/02/2021. She went to [facility] ED and had x-rays taken. She said that she is still having pain in both areas. She is in pain management and takes morphine and oxycodone.  Multiple studies were performed in the emergency room which did not reveal any acute changes.  These included CTs scan of her cervical spine and abdomen which included the thoracic and lumbar spine.  She CT scan of her head was also negative.  Films of her elbow and pelvis were negative for fracture she notes that she is not having any numbness or tingling.  She does not have any bowel or bladder problems and her urine has been clear no headaches. HPI  Review of Systems   Objective: Vital Signs: Ht 5' 6.5" (1.689 m)   Wt (!) 304 lb (137.9 kg)   BMI 48.33 kg/m   Physical Exam Constitutional:  Appearance: She is well-developed.  Eyes:     Pupils: Pupils are equal, round, and reactive to light.  Pulmonary:     Effort: Pulmonary effort is normal.  Skin:    General: Skin is warm and dry.  Neurological:     Mental Status: She is alert and oriented to person, place, and time.  Psychiatric:        Behavior: Behavior normal.     Ortho Exam awake alert and oriented x3.  Comfortable sitting.  Using a walker for ambulation because of mostly back pain.  She is able to touch her chin to her chest and had almost full neck extension with some posterior neck discomfort but had no issues relative to either upper extremity.  She had almost full rotation of the right to the left.  Reflexes were symmetrical to both upper extremities.  She was able to raise both arms fully overhead.  No longer has any significant  left elbow pain.  Good grip and good release.  Has had a prior history of carpal tunnel and may have some exacerbation with some numbing in her radial 3 digits more in the left than the right hand but is using the walker.  Straight leg raise negative.  Painless range of motion both hips and both knees.  With motor exam intact.  Able to raise both legs and full extension of the knees without issue.  Some tenderness along the lumbar spine in the paralumbar region but not in the flank.  Able to take a deep breath without any discomfort.  See CT scan of the abdomen and included the lower ribs without evidence of a fracture  Specialty Comments:  No specialty comments available.  Imaging: No results found.   PMFS History: Patient Active Problem List   Diagnosis Date Noted  . Chronic left-sided low back pain with left-sided sciatica 07/22/2017  . Thyroid nodule 04/16/2017  . Essential hypertension 03/13/2014  . Menometrorrhagia   . Dysmenorrhea   . Anemia   . Hyperlipidemia 05/23/2007  . OBESITY, MORBID 05/23/2007  . DYSFUNCTIONAL UTERINE BLEEDING - s/p LAVH 05/23/2007  . ROTATOR CUFF SYNDROME 05/23/2007  . KNEE PAIN, HX OF 05/23/2007   Past Medical History:  Diagnosis Date  . Anemia   . Anxiety   . Arthritis   . Diabetes mellitus without complication ([place])    recent dx diet controlled  . DVT (deep venous thrombosis) ([facility])    remote - took coumadin  . Dysmenorrhea   . Dysrhythmia    irregular heart beat  . Fibroid   . GERD (gastroesophageal reflux disease)    hx of  . Gout   . Hypercholesteremia   . Hypertension 4 months  . Menometrorrhagia   . Morbid obesity ([place])   . Prediabetes   . Substance abuse ([place])     Family History  Problem Relation Age of Onset  . Diabetes Mother   . Hypertension Mother   . Diabetes Father   . Hypertension Father   . Cancer Father 51  . Heart disease Other        No family history  . Cancer Brother 78    Past Surgical History:   Procedure Laterality Date  . ABDOMINAL HYSTERECTOMY  2008  . BREAST BIOPSY Left 2013   benign  . BUNIONECTOMY Bilateral   . CHOLECYSTECTOMY    . COLONOSCOPY WITH PROPOFOL N/A 10/27/2018   Procedure: COLONOSCOPY WITH PROPOFOL;  Surgeon: Wonda Horner, MD;  Location: [facility];  Service: Endoscopy;  Laterality: N/A;  . ECTOPIC PREGNANCY SURGERY  1980's  . MANDIBLE OSTEOTOMY Bilateral 07/03/2013   Procedure: BILATERAL TORI;  Surgeon: Gae Bon, DDS;  Location: [place];  Service: Oral Surgery;  Laterality: Bilateral;  . NM MYOCAR PERF WALL MOTION  07/2012   lexiscan - normal pattern of perfusion, low risk  . ROTATOR CUFF REPAIR Bilateral 2009  . SLEEP STUDY  07/27/2012   AHI 4.8/hr  . TOOTH EXTRACTION N/A 07/03/2013   Procedure: DENTAL EXTRACTION # 20;  Surgeon: Gae Bon, DDS;  Location: [place];  Service: Oral Surgery;  Laterality: N/A;  . TRANSTHORACIC ECHOCARDIOGRAM  07/2012   EF=>55%, mod conc LVH; LA mod dilated; trace MR; mild TR with normal RVSP; trace AV regurg  . TUBAL LIGATION     Social History   Occupational History    Employer: UNEMPLOYED    Comment: Disability  Tobacco Use  . Smoking status: Former Smoker    Packs/day: 0.10    Years: 6.00    Pack years: 0.60    Types: Cigarettes    Quit date: 02/14/2011    Years since quitting: 9.9  . Smokeless tobacco: Never Used  Vaping Use  . Vaping Use: Never used  Substance and Sexual Activity  . Alcohol use: No    Comment: Previous ETOH abuse  . Drug use: No    Comment: Previous crack use   . Sexual activity: Never    Birth control/protection: Abstinence

## 2021-02-19 ENCOUNTER — Encounter: Payer: Self-pay | Admitting: Orthopaedic Surgery

## 2021-02-19 ENCOUNTER — Ambulatory Visit: Payer: Medicaid Other | Admitting: Orthopaedic Surgery

## 2021-02-19 ENCOUNTER — Other Ambulatory Visit: Payer: Self-pay

## 2021-02-19 DIAGNOSIS — G8929 Other chronic pain: Secondary | ICD-10-CM | POA: Diagnosis not present

## 2021-02-19 DIAGNOSIS — M25522 Pain in left elbow: Secondary | ICD-10-CM | POA: Insufficient documentation

## 2021-02-19 DIAGNOSIS — M5442 Lumbago with sciatica, left side: Secondary | ICD-10-CM

## 2021-02-19 NOTE — Progress Notes (Signed)
Office Visit Note   Patient: [name]           Date of Birth: [birth year]           MRN: [id number] Visit Date: 02/19/2021              Requested by: Riki Sheer, NP [address] [place],  [place] [address] PCP: Riki Sheer, NP   Assessment & Plan: Visit Diagnoses:  1. OBESITY, MORBID   2. Pain in left elbow   3. Chronic left-sided low back pain with left-sided sciatica     Plan: Mrs. Durio is being followed for injury she sustained in a recent fall at a [employer].  She had an exacerbation of her chronic low back pain.  She has been involved in a pain clinic for years related to the arthritis of her knees and back as well as bilateral carpal tunnel.  She notes that her baseline is about a pain on a level of 4-5 and it was up to a "10" after the accident.  Presently it is about a 7 or 8 so she relates it is "improving" not having any specific radiation of pain to either lower extremity.  No bowel or bladder changes.  She is using the walker less.  She also had an injury to her left elbow and might have a very mild olecranon bursitis.  There is no mass.  I think she also irritated the ulnar nerve as she has had some occasional pain in the ulnar 2 digits with mild tenderness over the nerve but has good sensation and normal motor exam.  She is obviously improving from her fall.  No change in medicines.  Would like to see her again in about a month.  She definitely is improving  Follow-Up Instructions: Return in about 1 month (around 03/22/2021).   Orders:  No orders of the defined types were placed in this encounter.  No orders of the defined types were placed in this encounter.     Procedures: No procedures performed   Clinical Data: No additional findings.   Subjective: Chief Complaint  Patient presents with  . Lower Back - Follow-up  Patient presents today for follow up on her lower back pain. She is now 17 days out from her injury on  02/02/2021. Patient states that she is able to move around better since her last visit, but still hurts. She states that her left elbow seems to be causing her the most discomfort. Her pain in the elbow is located posteriorly. She is in pain management and takes oxycodone and Morphine.   HPI  Review of Systems   Objective: Vital Signs: Ht 5' 6.5" (1.689 m)   Wt (!) 304 lb (137.9 kg)   BMI 48.33 kg/m   Physical Exam Constitutional:      Appearance: She is well-developed.  Eyes:     Pupils: Pupils are equal, round, and reactive to light.  Pulmonary:     Effort: Pulmonary effort is normal.  Skin:    General: Skin is warm and dry.  Neurological:     Mental Status: She is alert and oriented to person, place, and time.  Psychiatric:        Behavior: Behavior normal.     Ortho Exam awake alert and oriented x3.  Comfortable sitting.  Straight leg raise negative.  No pain about either trochanter.  Motor exam appears to be intact.  Multiple areas of percussible tenderness  of the lumbar spine but apparently no different than in prior exams.  Specialty Comments:  No specialty comments available.  Imaging: No results found.   PMFS History: Patient Active Problem List   Diagnosis Date Noted  . Pain in left elbow 02/19/2021  . Chronic left-sided low back pain with left-sided sciatica 07/22/2017  . Thyroid nodule 04/16/2017  . Essential hypertension 03/13/2014  . Menometrorrhagia   . Dysmenorrhea   . Anemia   . Hyperlipidemia 05/23/2007  . OBESITY, MORBID 05/23/2007  . DYSFUNCTIONAL UTERINE BLEEDING - s/p LAVH 05/23/2007  . ROTATOR CUFF SYNDROME 05/23/2007  . KNEE PAIN, HX OF 05/23/2007   Past Medical History:  Diagnosis Date  . Anemia   . Anxiety   . Arthritis   . Diabetes mellitus without complication ([place])    recent dx diet controlled  . DVT (deep venous thrombosis) ([facility])    remote - took coumadin  . Dysmenorrhea   . Dysrhythmia    irregular heart beat  . Fibroid    . GERD (gastroesophageal reflux disease)    hx of  . Gout   . Hypercholesteremia   . Hypertension 4 months  . Menometrorrhagia   . Morbid obesity ([place])   . Prediabetes   . Substance abuse ([place])     Family History  Problem Relation Age of Onset  . Diabetes Mother   . Hypertension Mother   . Diabetes Father   . Hypertension Father   . Cancer Father 15  . Heart disease Other        No family history  . Cancer Brother 53    Past Surgical History:  Procedure Laterality Date  . ABDOMINAL HYSTERECTOMY  2008  . BREAST BIOPSY Left 2013   benign  . BUNIONECTOMY Bilateral   . CHOLECYSTECTOMY    . COLONOSCOPY WITH PROPOFOL N/A 10/27/2018   Procedure: COLONOSCOPY WITH PROPOFOL;  Surgeon: Wonda Horner, MD;  [facility];  Service: Endoscopy;  Laterality: N/A;  . ECTOPIC PREGNANCY SURGERY  1980's  . MANDIBLE OSTEOTOMY Bilateral 07/03/2013   Procedure: BILATERAL TORI;  Surgeon: Gae Bon, DDS;  Location: [place];  Service: Oral Surgery;  Laterality: Bilateral;  . NM MYOCAR PERF WALL MOTION  07/2012   lexiscan - normal pattern of perfusion, low risk  . ROTATOR CUFF REPAIR Bilateral 2009  . SLEEP STUDY  07/27/2012   AHI 4.8/hr  . TOOTH EXTRACTION N/A 07/03/2013   Procedure: DENTAL EXTRACTION # 20;  Surgeon: Gae Bon, DDS;  Location: [place];  Service: Oral Surgery;  Laterality: N/A;  . TRANSTHORACIC ECHOCARDIOGRAM  07/2012   EF=>55%, mod conc LVH; LA mod dilated; trace MR; mild TR with normal RVSP; trace AV regurg  . TUBAL LIGATION     Social History   Occupational History    Employer: UNEMPLOYED    Comment: Disability  Tobacco Use  . Smoking status: Former Smoker    Packs/day: 0.10    Years: 6.00    Pack years: 0.60    Types: Cigarettes    Quit date: 02/14/2011    Years since quitting: 10.0  . Smokeless tobacco: Never Used  Vaping Use  . Vaping Use: Never used  Substance and Sexual Activity  . Alcohol use: No    Comment: Previous ETOH abuse  . Drug use:  No    Comment: Previous crack use   . Sexual activity: Never    Birth control/protection: Abstinence

## 2021-02-24 ENCOUNTER — Telehealth: Payer: Self-pay | Admitting: Orthopaedic Surgery

## 2021-02-24 NOTE — Telephone Encounter (Signed)
Patient is requesting xrys of back and hip. Please call when ready. [phone]

## 2021-02-25 NOTE — Telephone Encounter (Signed)
Called patient and advised CD was ready for pickup at front desk.  

## 2021-03-19 ENCOUNTER — Encounter: Payer: Self-pay | Admitting: Orthopaedic Surgery

## 2021-03-19 ENCOUNTER — Other Ambulatory Visit: Payer: Self-pay

## 2021-03-19 ENCOUNTER — Ambulatory Visit: Payer: Medicaid Other | Admitting: Orthopaedic Surgery

## 2021-03-19 DIAGNOSIS — G8929 Other chronic pain: Secondary | ICD-10-CM | POA: Diagnosis not present

## 2021-03-19 DIAGNOSIS — M25522 Pain in left elbow: Secondary | ICD-10-CM | POA: Diagnosis not present

## 2021-03-19 DIAGNOSIS — M5442 Lumbago with sciatica, left side: Secondary | ICD-10-CM

## 2021-03-19 NOTE — Progress Notes (Signed)
Office Visit Note   Patient: [name]           Date of Birth: Aug 13, 1961           MRN: [id number] Visit Date: 03/19/2021              Requested by: Riki Sheer, NP [address] [place],  [place] [address] PCP: Riki Sheer, NP   Assessment & Plan: Visit Diagnoses:  1. Chronic left-sided low back pain with left-sided sciatica   2. Pain in left elbow   3. OBESITY, MORBID     Plan: Mrs. Bree sustained an injury when she fell at a [employer] on 24 April.  She notes that she is doing much better and is nearly back to her baseline.  She has had chronic back pain and has been involved in a pain clinic.  She presently is on oxycodone and occasional morphine.  She is also seeing the chiropractor who has helped.  She notes that her left elbow pain has completely resolved.  She still has occasional stiffness in her neck but no referred pain to either upper extremity and she is not having any headaches.  She had excellent range of motion on exam today.  She had still occasionally has some pain into her right lower extremity but she notes its not much different from that which she was experiencing before her fall.  Neurologically she appears to be intact.  Overall she seems to be fine in reference to her fall at the [employer].  She is stabilized.  I will plan to see her back as needed but told her that she should feel free to call if she had any further problems.  She will follow-up with a chiropractor and the pain clinic  Follow-Up Instructions: Return if symptoms worsen or fail to improve.   Orders:  No orders of the defined types were placed in this encounter.  No orders of the defined types were placed in this encounter.     Procedures: No procedures performed   Clinical Data: No additional findings.   Subjective: Chief Complaint  Patient presents with  . Lower Back - Pain  Approximately 6 weeks status post a fall at the [employer] where  she injured her neck left elbow and low back.  Feeling much better relates that she is no longer using a walker but just a single-point cane.  She is being followed by the chiropractor and in her pain clinic.  She feels like she is almost back to baseline.  Not having any numbness or tingling into either upper extremity no longer having the pain in the right flank region  HPI  Review of Systems   Objective: Vital Signs: There were no vitals taken for this visit.  Physical Exam Constitutional:      Appearance: She is well-developed.  Eyes:     Pupils: Pupils are equal, round, and reactive to light.  Pulmonary:     Effort: Pulmonary effort is normal.  Skin:    General: Skin is warm and dry.  Neurological:     Mental Status: She is alert and oriented to person, place, and time.  Psychiatric:        Behavior: Behavior normal.     Ortho Exam awake alert and oriented x3.  Comfortable sitting.  Walks with the use of a single-point cane in her right hand.  Full range of motion of the cervical spine in flexion  extension and rotation without any referred pain to either upper extremity.  Left elbow without pain and normal range of motion.  Good grip and good release.  Straight leg raise negative bilaterally.  Some percussible tenderness of the lumbar spine.  No flank pain.  Motor exam intact.  No pain about either hip or knee  Specialty Comments:  No specialty comments available.  Imaging: No results found.   PMFS History: Patient Active Problem List   Diagnosis Date Noted  . Pain in left elbow 02/19/2021  . Chronic left-sided low back pain with left-sided sciatica 07/22/2017  . Thyroid nodule 04/16/2017  . Essential hypertension 03/13/2014  . Menometrorrhagia   . Dysmenorrhea   . Anemia   . Hyperlipidemia 05/23/2007  . OBESITY, MORBID 05/23/2007  . DYSFUNCTIONAL UTERINE BLEEDING - s/p LAVH 05/23/2007  . ROTATOR CUFF SYNDROME 05/23/2007  . KNEE PAIN, HX OF 05/23/2007   Past  Medical History:  Diagnosis Date  . Anemia   . Anxiety   . Arthritis   . Diabetes mellitus without complication ([place])    recent dx diet controlled  . DVT (deep venous thrombosis) ([facility])    remote - took coumadin  . Dysmenorrhea   . Dysrhythmia    irregular heart beat  . Fibroid   . GERD (gastroesophageal reflux disease)    hx of  . Gout   . Hypercholesteremia   . Hypertension 4 months  . Menometrorrhagia   . Morbid obesity ([place])   . Prediabetes   . Substance abuse ([place])     Family History  Problem Relation Age of Onset  . Diabetes Mother   . Hypertension Mother   . Diabetes Father   . Hypertension Father   . Cancer Father 37  . Heart disease Other        No family history  . Cancer Brother 65    Past Surgical History:  Procedure Laterality Date  . ABDOMINAL HYSTERECTOMY  2008  . BREAST BIOPSY Left 2013   benign  . BUNIONECTOMY Bilateral   . CHOLECYSTECTOMY    . COLONOSCOPY WITH PROPOFOL N/A 10/27/2018   Procedure: COLONOSCOPY WITH PROPOFOL;  Surgeon: Wonda Horner, MD;  [facility];  Service: Endoscopy;  Laterality: N/A;  . ECTOPIC PREGNANCY SURGERY  1980's  . MANDIBLE OSTEOTOMY Bilateral 07/03/2013   Procedure: BILATERAL TORI;  Surgeon: Gae Bon, DDS;  Location: [facility];  Service: Oral Surgery;  Laterality: Bilateral;  . NM MYOCAR PERF WALL MOTION  07/2012   lexiscan - normal pattern of perfusion, low risk  . ROTATOR CUFF REPAIR Bilateral 2009  . SLEEP STUDY  07/27/2012   AHI 4.8/hr  . TOOTH EXTRACTION N/A 07/03/2013   Procedure: DENTAL EXTRACTION # 20;  Surgeon: Gae Bon, DDS;  Location: [facility];  Service: Oral Surgery;  Laterality: N/A;  . TRANSTHORACIC ECHOCARDIOGRAM  07/2012   EF=>55%, mod conc LVH; LA mod dilated; trace MR; mild TR with normal RVSP; trace AV regurg  . TUBAL LIGATION     Social History   Occupational History    Employer: UNEMPLOYED    Comment: Disability  Tobacco Use  . Smoking status: Former Smoker    Packs/day:  0.10    Years: 6.00    Pack years: 0.60    Types: Cigarettes    Quit date: 02/14/2011    Years since quitting: 10.0  . Smokeless tobacco: Never Used  Vaping Use  . Vaping Use: Never used  Substance and Sexual Activity  .  Alcohol use: No    Comment: Previous ETOH abuse  . Drug use: No    Comment: Previous crack use   . Sexual activity: Never    Birth control/protection: Abstinence     Garald Balding, MD   Note - This record has been created using [employer].  Chart creation errors have been sought, but may not always  have been located. Such creation errors do not reflect on  the standard of medical care.

## 2021-04-18 ENCOUNTER — Other Ambulatory Visit: Payer: Self-pay | Admitting: *Deleted

## 2021-04-18 DIAGNOSIS — R768 Other specified abnormal immunological findings in serum: Secondary | ICD-10-CM

## 2021-04-21 LAB — CBC WITH DIFFERENTIAL/PLATELET
Absolute Monocytes: 413 cells/uL (ref 200–950)
Basophils Absolute: 22 cells/uL (ref 0–200)
Basophils Relative: 0.4 %
Eosinophils Absolute: 171 cells/uL (ref 15–500)
Eosinophils Relative: 3.1 %
HCT: 37.9 % (ref 35.0–45.0)
Hemoglobin: 12.1 g/dL (ref 11.7–15.5)
Lymphs Abs: 2354 cells/uL (ref 850–3900)
MCH: 28.8 pg (ref 27.0–33.0)
MCHC: 31.9 g/dL — ABNORMAL LOW (ref 32.0–36.0)
MCV: 90.2 fL (ref 80.0–100.0)
MPV: 10.3 fL (ref 7.5–12.5)
Monocytes Relative: 7.5 %
Neutro Abs: 2541 cells/uL (ref 1500–7800)
Neutrophils Relative %: 46.2 %
Platelets: 245 10*3/uL (ref 140–400)
RBC: 4.2 10*6/uL (ref 3.80–5.10)
RDW: 14.4 % (ref 11.0–15.0)
Total Lymphocyte: 42.8 %
WBC: 5.5 10*3/uL (ref 3.8–10.8)

## 2021-04-21 LAB — ANA: Anti Nuclear Antibody (ANA): NEGATIVE

## 2021-04-21 LAB — CARDIOLIPIN ANTIBODIES, IGG, IGM, IGA
Anticardiolipin IgA: 15.5 APL-U/mL
Anticardiolipin IgG: >112 GPL-U/mL — ABNORMAL HIGH
Anticardiolipin IgM: <2 MPL-U/mL

## 2021-04-21 LAB — COMPLETE METABOLIC PANEL WITH GFR
AG Ratio: 1.3 (calc) (ref 1.0–2.5)
ALT: 31 U/L — ABNORMAL HIGH (ref 6–29)
AST: 22 U/L (ref 10–35)
Albumin: 4 g/dL (ref 3.6–5.1)
Alkaline phosphatase (APISO): 58 U/L (ref 37–153)
BUN/Creatinine Ratio: 9 (calc) (ref 6–22)
BUN: 10 mg/dL (ref 7–25)
CO2: 32 mmol/L (ref 20–32)
Calcium: 9.3 mg/dL (ref 8.6–10.4)
Chloride: 105 mmol/L (ref 98–110)
Creat: 1.06 mg/dL — ABNORMAL HIGH (ref 0.50–0.99)
GFR, Est African American: 66 mL/min/{1.73_m2} (ref >=60)
GFR, Est Non African American: 57 mL/min/{1.73_m2} — ABNORMAL LOW (ref >=60)
Globulin: 3 g/dL (calc) (ref 1.9–3.7)
Glucose, Bld: 104 mg/dL — ABNORMAL HIGH (ref 65–99)
Potassium: 4.4 mmol/L (ref 3.5–5.3)
Sodium: 143 mmol/L (ref 135–146)
Total Bilirubin: 0.4 mg/dL (ref 0.2–1.2)
Total Protein: 7 g/dL (ref 6.1–8.1)

## 2021-04-21 LAB — C3 AND C4
C3 Complement: 155 mg/dL (ref 83–193)
C4 Complement: 22 mg/dL (ref 15–57)

## 2021-04-21 LAB — ANTI-DNA ANTIBODY, DOUBLE-STRANDED: ds DNA Ab: 14 IU/mL — ABNORMAL HIGH

## 2021-04-21 LAB — BETA-2 GLYCOPROTEIN ANTIBODIES
Beta-2 Glyco 1 IgA: 13.9 U/mL
Beta-2 Glyco 1 IgM: <2 U/mL
Beta-2 Glyco I IgG: 103.5 U/mL — ABNORMAL HIGH

## 2021-04-21 LAB — SEDIMENTATION RATE: Sed Rate: 19 mm/h (ref 0–30)

## 2021-04-21 NOTE — Progress Notes (Deleted)
Office Visit Note  Patient: [name]             Date of Birth: 08-06-1961           MRN: [id number]             PCP: Riki Sheer, NP Referring: Riki Sheer, NP Visit Date: 05/05/2021 Occupation: @GUAROCC @  Subjective:  No chief complaint on file.   History of Present Illness: [name] is a 60 y.o. female ***   Activities of Daily Living:  Patient reports morning stiffness for *** {minute/hour:19697}.   Patient {ACTIONS;DENIES/REPORTS:21021675::"Denies"} nocturnal pain.  Difficulty dressing/grooming: {ACTIONS;DENIES/REPORTS:21021675::"Denies"} Difficulty climbing stairs: {ACTIONS;DENIES/REPORTS:21021675::"Denies"} Difficulty getting out of chair: {ACTIONS;DENIES/REPORTS:21021675::"Denies"} Difficulty using hands for taps, buttons, cutlery, and/or writing: {ACTIONS;DENIES/REPORTS:21021675::"Denies"}  No Rheumatology ROS completed.   PMFS History:  Patient Active Problem List   Diagnosis Date Noted   Pain in left elbow 02/19/2021   Chronic left-sided low back pain with left-sided sciatica 07/22/2017   Thyroid nodule 04/16/2017   Essential hypertension 03/13/2014   Menometrorrhagia    Dysmenorrhea    Anemia    Hyperlipidemia 05/23/2007   OBESITY, MORBID 05/23/2007   DYSFUNCTIONAL UTERINE BLEEDING - s/p LAVH 05/23/2007   ROTATOR CUFF SYNDROME 05/23/2007   KNEE PAIN, HX OF 05/23/2007    Past Medical History:  Diagnosis Date   Anemia    Anxiety    Arthritis    Diabetes mellitus without complication (HCC)    recent dx diet controlled   DVT (deep venous thrombosis) (HCC)    remote - took coumadin   Dysmenorrhea    Dysrhythmia    irregular heart beat   Fibroid    GERD (gastroesophageal reflux disease)    hx of   Gout    Hypercholesteremia    Hypertension 4 months   Menometrorrhagia    Morbid obesity ([place])    Prediabetes    Substance abuse ([place])     Family History  Problem Relation Age of Onset   Diabetes Mother     Hypertension Mother    Diabetes Father    Hypertension Father    Cancer Father 1   Heart disease Other        No family history   Cancer Brother 76   Past Surgical History:  Procedure Laterality Date   ABDOMINAL HYSTERECTOMY  2008   BREAST BIOPSY Left 2013   benign   BUNIONECTOMY Bilateral    CHOLECYSTECTOMY     COLONOSCOPY WITH PROPOFOL N/A 10/27/2018   Procedure: COLONOSCOPY WITH PROPOFOL;  Surgeon: Wonda Horner, MD;  Location: [facility];  Service: Endoscopy;  Laterality: N/A;   ECTOPIC PREGNANCY SURGERY  1980's   MANDIBLE OSTEOTOMY Bilateral 07/03/2013   Procedure: BILATERAL TORI;  Surgeon: Gae Bon, DDS;  Location: [facility];  Service: Oral Surgery;  Laterality: Bilateral;   NM MYOCAR PERF WALL MOTION  07/2012   lexiscan - normal pattern of perfusion, low risk   ROTATOR CUFF REPAIR Bilateral 2009   SLEEP STUDY  07/27/2012   AHI 4.8/hr   TOOTH EXTRACTION N/A 07/03/2013   Procedure: DENTAL EXTRACTION # 20;  Surgeon: Gae Bon, DDS;  Location: [place];  Service: Oral Surgery;  Laterality: N/A;   TRANSTHORACIC ECHOCARDIOGRAM  07/2012   EF=>55%, mod conc LVH; LA mod dilated; trace MR; mild TR with normal RVSP; trace AV regurg   TUBAL LIGATION     Social History   Social History Narrative   Not on file   Immunization  History  Administered Date(s) Administered   PFIZER(Purple Top)SARS-COV-2 Vaccination 05/13/2020, 05/24/2020     Objective: Vital Signs: There were no vitals taken for this visit.   Physical Exam   Musculoskeletal Exam: ***  CDAI Exam: CDAI Score: -- Patient Global: --; Provider Global: -- Swollen: --; Tender: -- Joint Exam 05/05/2021   No joint exam has been documented for this visit   There is currently no information documented on the homunculus. Go to the Rheumatology activity and complete the homunculus joint exam.  Investigation: No additional findings.  Imaging: No results found.  Recent Labs: Lab Results  Component Value  Date   WBC 5.5 04/18/2021   HGB 12.1 04/18/2021   PLT 245 04/18/2021   NA 143 04/18/2021   K 4.4 04/18/2021   CL 105 04/18/2021   CO2 32 04/18/2021   GLUCOSE 104 (H) 04/18/2021   BUN 10 04/18/2021   CREATININE 1.06 (H) 04/18/2021   BILITOT 0.4 04/18/2021   ALKPHOS 47 02/02/2021   AST 22 04/18/2021   ALT 31 (H) 04/18/2021   PROT 7.0 04/18/2021   ALBUMIN 3.7 02/02/2021   CALCIUM 9.3 04/18/2021   GFRAA 66 04/18/2021    Speciality Comments: No specialty comments available.  Procedures:  No procedures performed Allergies: Buprenorphine hcl-naloxone hcl, Naloxone, and Dilaudid [hydromorphone hcl]   Assessment / Plan:     Visit Diagnoses: No diagnosis found.  Orders: No orders of the defined types were placed in this encounter.  No orders of the defined types were placed in this encounter.   Face-to-face time spent with patient was *** minutes. Greater than 50% of time was spent in counseling and coordination of care.  Follow-Up Instructions: No follow-ups on file.   Earnestine Mealing, CMA  Note - This record has been created using Editor, commissioning.  Chart creation errors have been sought, but may not always  have been located. Such creation errors do not reflect on  the standard of medical care.

## 2021-04-22 NOTE — Progress Notes (Signed)
ANA is negative, double-stranded DNA is positive,Complements are normal, beta-2 IgG is elevated, anticardiolipin IgG is elevated, sed rate is normal, CMP showed mild elevation of ALT and elevated creatinine.  CBC is normal.  I will discuss results at the follow-up visit.

## 2021-04-30 ENCOUNTER — Encounter (INDEPENDENT_AMBULATORY_CARE_PROVIDER_SITE_OTHER): Payer: Medicaid Other | Admitting: Ophthalmology

## 2021-05-02 NOTE — Progress Notes (Signed)
Office Visit Note  Patient: [name]             Date of Birth: January 31, 1961           MRN: [id number]             PCP: Riki Sheer, NP Referring: Riki Sheer, NP Visit Date: 05/06/2021 Occupation: _0 @  Subjective:  Discuss lab work   History of Present Illness: [name] is a 60 y.o. female with history of positive ANA and DDD. She presents today to discuss lab work obtained on 04/18/21.  Patient reports that she continues to have chronic pain in both knee joints and is followed by Dr. Durward Fortes.  She uses a cane to assist with ambulation.  She has ongoing symptoms of carpal tunnel in both hands and has been wearing carpal tunnel braces at night which improve her symptoms.  She denies any joint swelling at this time.  She denies any recent rashes or photosensitivity.  She has intermittent symptoms of Raynaud's but denies any digital ulcerations.  She has ongoing hair thinning.  She denies any swollen lymph nodes.  She has not had any oral or nasal ulcerations.  She has chronic dry mouth and uses Biotene products for symptomatic relief.  She denies any shortness of breath, pleuritic chest pain, palpitations. Patient reports that she had a DVT in 2015 and was treated with warfarin for 2 years.  She has been on aspirin 81 mg daily since then.  She has not had any recurrence of a DVT or PE.   Activities of Daily Living:  Patient reports morning stiffness for 3 hours.   Patient Denies nocturnal pain.  Difficulty dressing/grooming: Reports Difficulty climbing stairs: Reports Difficulty getting out of chair: Reports Difficulty using hands for taps, buttons, cutlery, and/or writing: Reports  Review of Systems  Constitutional:  Negative for fatigue.  HENT:  Positive for mouth dryness. Negative for mouth sores and nose dryness.   Eyes:  Negative for pain, visual disturbance and dryness.  Respiratory:  Negative for cough, hemoptysis, shortness of breath  and difficulty breathing.   Cardiovascular:  Negative for chest pain, palpitations, hypertension and swelling in legs/feet.  Gastrointestinal:  Negative for blood in stool, constipation and diarrhea.  Endocrine: Negative for excessive thirst and increased urination.  Genitourinary:  Negative for difficulty urinating and painful urination.  Musculoskeletal:  Positive for joint pain, gait problem, joint pain, muscle weakness, morning stiffness and muscle tenderness. Negative for joint swelling, myalgias and myalgias.  Skin:  Negative for color change, pallor, rash, hair loss, nodules/bumps, skin tightness, ulcers and sensitivity to sunlight.  Allergic/Immunologic: Negative for susceptible to infections.  Neurological:  Positive for numbness. Negative for dizziness, headaches and weakness.  Hematological:  Negative for bruising/bleeding tendency and swollen glands.  Psychiatric/Behavioral:  Positive for sleep disturbance. Negative for depressed mood. The patient is not nervous/anxious.    PMFS History:  Patient Active Problem List   Diagnosis Date Noted   Pain in left elbow 02/19/2021   Chronic left-sided low back pain with left-sided sciatica 07/22/2017   Thyroid nodule 04/16/2017   Essential hypertension 03/13/2014   Menometrorrhagia    Dysmenorrhea    Anemia    Hyperlipidemia 05/23/2007   OBESITY, MORBID 05/23/2007   DYSFUNCTIONAL UTERINE BLEEDING - s/p LAVH 05/23/2007   ROTATOR CUFF SYNDROME 05/23/2007   KNEE PAIN, HX OF 05/23/2007    Past Medical History:  Diagnosis Date   Anemia    Anxiety  Arthritis    Diabetes mellitus without complication ([place])    recent dx diet controlled   DVT (deep venous thrombosis) (HCC)    remote - took coumadin   Dysmenorrhea    Dysrhythmia    irregular heart beat   Fibroid    GERD (gastroesophageal reflux disease)    hx of   Gout    Hypercholesteremia    Hypertension 4 months   Menometrorrhagia    Morbid obesity (HCC)    Prediabetes     Substance abuse ([place])     Family History  Problem Relation Age of Onset   Diabetes Mother    Hypertension Mother    Diabetes Father    Hypertension Father    Cancer Father 32   Heart disease Other        No family history   Cancer Brother 72   Past Surgical History:  Procedure Laterality Date   ABDOMINAL HYSTERECTOMY  2008   BREAST BIOPSY Left 2013   benign   BUNIONECTOMY Bilateral    CHOLECYSTECTOMY     COLONOSCOPY WITH PROPOFOL N/A 10/27/2018   Procedure: COLONOSCOPY WITH PROPOFOL;  Surgeon: Wonda Horner, MD;  Location: [facility];  Service: Endoscopy;  Laterality: N/A;   ECTOPIC PREGNANCY SURGERY  1980's   MANDIBLE OSTEOTOMY Bilateral 07/03/2013   Procedure: BILATERAL TORI;  Surgeon: Gae Bon, DDS;  Location: [facility];  Service: Oral Surgery;  Laterality: Bilateral;   NM MYOCAR PERF WALL MOTION  07/2012   lexiscan - normal pattern of perfusion, low risk   ROTATOR CUFF REPAIR Bilateral 2009   SLEEP STUDY  07/27/2012   AHI 4.8/hr   TOOTH EXTRACTION N/A 07/03/2013   Procedure: DENTAL EXTRACTION # 20;  Surgeon: Gae Bon, DDS;  Location: [facility] [facility];  Service: Oral Surgery;  Laterality: N/A;   TRANSTHORACIC ECHOCARDIOGRAM  07/2012   EF=>55%, mod conc LVH; LA mod dilated; trace MR; mild TR with normal RVSP; trace AV regurg   TUBAL LIGATION     Social History   Social History Narrative   Not on file   Immunization History  Administered Date(s) Administered   PFIZER(Purple Top)SARS-COV-2 Vaccination 05/13/2020, 05/24/2020     Objective: Vital Signs: BP 104/67 (BP Location: Left Arm, Patient Position: Sitting, Cuff Size: Large)   Pulse 68   Resp 16   Ht 5' 6.5" (1.689 m)   Wt (!) 308 lb 6.4 oz (139.9 kg)   BMI 49.03 kg/m    Physical Exam Vitals and nursing note reviewed.  Constitutional:      Appearance: She is well-developed.  HENT:     Head: Normocephalic and atraumatic.  Eyes:     Conjunctiva/sclera: Conjunctivae normal.  Pulmonary:     Effort:  Pulmonary effort is normal.  Abdominal:     Palpations: Abdomen is soft.  Musculoskeletal:     Cervical back: Normal range of motion.  Lymphadenopathy:     Cervical: No cervical adenopathy.  Skin:    General: Skin is warm and dry.     Capillary Refill: Capillary refill takes less than 2 seconds.     Comments: No malar rash. No digital ulcerations or signs of sclerodactyly.   Neurological:     Mental Status: She is alert and oriented to person, place, and time.  Psychiatric:        Behavior: Behavior normal.     Musculoskeletal Exam: C-spine, thoracic spine, and lumbar spine good ROM.  No midline spinal tenderness or SI joint tenderness noted. Shoulder  joints, elbow joints, wrist joints, MCPs, PIPs, and DIPs good ROM with no synovitis.  Complete fist formation bilaterally.  Hip joints, knee joints, and ankle joints good ROM with no discomfort. No warmth or effusion of knee joints.  No tenderness or swelling of ankle joints.   CDAI Exam: CDAI Score: -- Patient Global: --; Provider Global: -- Swollen: --; Tender: -- Joint Exam 05/06/2021   No joint exam has been documented for this visit   There is currently no information documented on the homunculus. Go to the Rheumatology activity and complete the homunculus joint exam.  Investigation: No additional findings.  Imaging: No results found.  Recent Labs: Lab Results  Component Value Date   WBC 5.5 04/18/2021   HGB 12.1 04/18/2021   PLT 245 04/18/2021   NA 143 04/18/2021   K 4.4 04/18/2021   CL 105 04/18/2021   CO2 32 04/18/2021   GLUCOSE 104 (H) 04/18/2021   BUN 10 04/18/2021   CREATININE 1.06 (H) 04/18/2021   BILITOT 0.4 04/18/2021   ALKPHOS 47 02/02/2021   AST 22 04/18/2021   ALT 31 (H) 04/18/2021   PROT 7.0 04/18/2021   ALBUMIN 3.7 02/02/2021   CALCIUM 9.3 04/18/2021   GFRAA 66 04/18/2021    Speciality Comments: No specialty comments available.  Procedures:  No procedures performed Allergies:  Buprenorphine hcl-naloxone hcl, Naloxone, and Dilaudid [hydromorphone hcl]   Assessment / Plan:     Visit Diagnoses: Undifferentiated connective tissue disease ([place])-  07/31/20: ANA+, dsdNA 18, RNP, smith-, Ro-, La ab negative.  09/23/20: AVISE lupus results: equivocal-index 0.9.  CB-CAP: Ed4d+, ANA 1:160 speckled, Anti-B2 glycoprotein equivocal, Anti-cardiolipin IgG weak positive.   Lab work from 04/18/21 was reviewed today in the office: ANA negative, dsDNA 14, ESR 19, complements WNL, anticardiolipin IgG 112, beta-2 glycoprotein IgG 103.5. Results were discussed with Dr. Estanislado Pandy and the patient today.   She has not noticed any new or worsening symptoms since her last office visit on 11/04/20.  She has ongoing mouth dryness, arthralgias, and hair thinning.  Subjective complaint of raynaud's phenomenon.  No signs of digital ulcerations or sclerodactyly noted.  She has not had any recent rashes or photosensitivity.  She is not experiencing any SOB, palpitations, or pleuritic chest pain. No oral or nasal ulcerations. No cervical lymphadenopathy palpable.   Discussed that she does not currently meet criteria for systemic lupus but has several antibodies positive that raise the concern for an undifferentiated connective tissue disease.  Due to previously being diagnosed with DVT in 2015 and now having beta-2 GLP and anti-cardiolipin IgG positivity she will benefit from continuing on aspirin 81 mg daily.  Dr. Estanislado Pandy also recommends a referral to hematology to discuss restarting the patient on warfarin to prevent a future event.  We discussed different treatment options for undifferentiated connective tissue disease in detail.  Indications, contraindications, and potential side effects of plaquenil were discussed.  All questions were addressed and consent was obtained.   She will be starting on Plaquenil 200 mg 1 tablet by mouth twice daily. Prescription was sent the pharmacy. She was advised to notify us if  she cannot tolerate taking plaquenil. She will follow up in 8 weeks to assess her response.     - Plan: Ambulatory referral to Hematology / Oncology  Patient was counseled on the purpose, proper use, and adverse effects of hydroxychloroquine including nausea/diarrhea, skin rash, headaches, and sun sensitivity.  Discussed importance of annual eye exams while on hydroxychloroquine to monitor to ocular toxicity  and discussed importance of frequent laboratory monitoring.  Provided patient with eye exam form for baseline ophthalmologic exam.  Provided patient with educational materials on hydroxychloroquine and answered all questions.  Patient consented to hydroxychloroquine.  Will upload consent in the media tab.    Dose will be Plaquenil 200 mg twice daily.  Prescription pending lab results.  High risk medication use: She will be starting on plaquenil 200 mg 1 tablet by mouth twice daily.  Advised to schedule a baseline Plaquenil eye exam.  She was given a Plaquenil eye exam form to take with her to her appointment.  CBC and CMP were drawn on 04/18/2021 and results were reviewed today in the office.  She will return for lab work in 1 month, 3 months, then every 5 months to monitor for drug toxicity.  Standing orders for CBC and CMP will be placed today.  History of DVT (deep vein thrombosis) - 2015, treated with warfarin for 2 years.  She is currently on aspirin 81 mg 1 tablet daily. No recurrence of DVT or new events. Anticardiolipin IgG 112 and beta-2 glycoprotein IgG 103.5-discussed results with the patient today as well as with Dr. Estanislado Pandy.  Dr. Estanislado Pandy recommends referring the patient to hematology to discuss restarting on warfarin to prevent a future event.  - Plan: Ambulatory referral to Hematology / Oncology  Anticardiolipin antibody positive - Anticardiolipin IgG 112, beta-2 glycoprotein IgG 103.5. She was advised to remain on aspirin 81 mg daily.  Referral to hematology was placed today to  discuss possibly restarting on Warfarin.   Plan: Ambulatory referral to Hematology / Oncology  Bilateral carpal tunnel syndrome: She experiences intermittent symptoms of carpal tunnel syndrome bilaterally.  She has been wearing carpal tunnel braces at night which have improved her symptoms.  Chronic pain of both hips - She has chronic pain in both hips, right>left.  Followed by Dr. Durward Fortes.  She is using a cane to assist with ambulation.   Chronic pain of both knees: She continues to have chronic pain in both knee joints and continues to follow-up with Dr. Durward Fortes.  She has been using a cane to assist with ambulation.  She has good range of motion of both knees with no discomfort.  No warmth or effusion was noted on exam today.   DDD (degenerative disc disease), lumbar - Followed by Dr. Ernestina Patches.  She had radiofrequency ablation and epidural steroid injections in the past.  She has no midline spinal tenderness on exam.   Chronic pain syndrome - She [facility].  Other medical conditions are listed as follows:   History of hyperlipidemia  Essential hypertension: BP updated on 104/67.   History of anemia  Hepatic steatosis  Thyroid nodule  Female pattern hair loss    Orders: Orders Placed This Encounter  Procedures   Ambulatory referral to Hematology / Oncology    No orders of the defined types were placed in this encounter.     Follow-Up Instructions: Return in about 8 weeks (around 07/01/2021) for +ANA, DDD.   Ofilia Neas, PA-C  Note - This record has been created using Dragon software.  Chart creation errors have been sought, but may not always  have been located. Such creation errors do not reflect on  the standard of medical care.

## 2021-05-05 ENCOUNTER — Ambulatory Visit: Payer: Medicaid Other | Admitting: Rheumatology

## 2021-05-05 DIAGNOSIS — I10 Essential (primary) hypertension: Secondary | ICD-10-CM

## 2021-05-05 DIAGNOSIS — Z86718 Personal history of other venous thrombosis and embolism: Secondary | ICD-10-CM

## 2021-05-05 DIAGNOSIS — L658 Other specified nonscarring hair loss: Secondary | ICD-10-CM

## 2021-05-05 DIAGNOSIS — E041 Nontoxic single thyroid nodule: Secondary | ICD-10-CM

## 2021-05-05 DIAGNOSIS — G8929 Other chronic pain: Secondary | ICD-10-CM

## 2021-05-05 DIAGNOSIS — G5603 Carpal tunnel syndrome, bilateral upper limbs: Secondary | ICD-10-CM

## 2021-05-05 DIAGNOSIS — M5136 Other intervertebral disc degeneration, lumbar region: Secondary | ICD-10-CM

## 2021-05-05 DIAGNOSIS — R768 Other specified abnormal immunological findings in serum: Secondary | ICD-10-CM

## 2021-05-05 DIAGNOSIS — G894 Chronic pain syndrome: Secondary | ICD-10-CM

## 2021-05-05 DIAGNOSIS — K760 Fatty (change of) liver, not elsewhere classified: Secondary | ICD-10-CM

## 2021-05-05 DIAGNOSIS — Z8639 Personal history of other endocrine, nutritional and metabolic disease: Secondary | ICD-10-CM

## 2021-05-05 DIAGNOSIS — Z862 Personal history of diseases of the blood and blood-forming organs and certain disorders involving the immune mechanism: Secondary | ICD-10-CM

## 2021-05-06 ENCOUNTER — Other Ambulatory Visit: Payer: Self-pay

## 2021-05-06 ENCOUNTER — Ambulatory Visit: Payer: Medicaid Other | Admitting: Physician Assistant

## 2021-05-06 ENCOUNTER — Encounter: Payer: Self-pay | Admitting: Physician Assistant

## 2021-05-06 VITALS — BP 104/67 | HR 68 | Resp 16 | Ht 66.5 in | Wt 308.4 lb

## 2021-05-06 DIAGNOSIS — Z86718 Personal history of other venous thrombosis and embolism: Secondary | ICD-10-CM

## 2021-05-06 DIAGNOSIS — R768 Other specified abnormal immunological findings in serum: Secondary | ICD-10-CM

## 2021-05-06 DIAGNOSIS — K760 Fatty (change of) liver, not elsewhere classified: Secondary | ICD-10-CM

## 2021-05-06 DIAGNOSIS — R760 Raised antibody titer: Secondary | ICD-10-CM | POA: Diagnosis not present

## 2021-05-06 DIAGNOSIS — M359 Systemic involvement of connective tissue, unspecified: Secondary | ICD-10-CM | POA: Diagnosis not present

## 2021-05-06 DIAGNOSIS — Z79899 Other long term (current) drug therapy: Secondary | ICD-10-CM

## 2021-05-06 DIAGNOSIS — M25561 Pain in right knee: Secondary | ICD-10-CM

## 2021-05-06 DIAGNOSIS — I10 Essential (primary) hypertension: Secondary | ICD-10-CM

## 2021-05-06 DIAGNOSIS — G5603 Carpal tunnel syndrome, bilateral upper limbs: Secondary | ICD-10-CM

## 2021-05-06 DIAGNOSIS — Z862 Personal history of diseases of the blood and blood-forming organs and certain disorders involving the immune mechanism: Secondary | ICD-10-CM

## 2021-05-06 DIAGNOSIS — E041 Nontoxic single thyroid nodule: Secondary | ICD-10-CM

## 2021-05-06 DIAGNOSIS — L658 Other specified nonscarring hair loss: Secondary | ICD-10-CM

## 2021-05-06 DIAGNOSIS — M25562 Pain in left knee: Secondary | ICD-10-CM

## 2021-05-06 DIAGNOSIS — G8929 Other chronic pain: Secondary | ICD-10-CM

## 2021-05-06 DIAGNOSIS — M25551 Pain in right hip: Secondary | ICD-10-CM

## 2021-05-06 DIAGNOSIS — M25552 Pain in left hip: Secondary | ICD-10-CM

## 2021-05-06 DIAGNOSIS — M5136 Other intervertebral disc degeneration, lumbar region: Secondary | ICD-10-CM

## 2021-05-06 DIAGNOSIS — G894 Chronic pain syndrome: Secondary | ICD-10-CM

## 2021-05-06 DIAGNOSIS — Z8639 Personal history of other endocrine, nutritional and metabolic disease: Secondary | ICD-10-CM

## 2021-05-06 DIAGNOSIS — M51369 Other intervertebral disc degeneration, lumbar region without mention of lumbar back pain or lower extremity pain: Secondary | ICD-10-CM

## 2021-05-06 MED ORDER — HYDROXYCHLOROQUINE SULFATE 200 MG PO TABS: 200.0000 mg | ORAL_TABLET | Freq: Two times a day (BID) | ORAL | 0 refills | Status: DC

## 2021-05-06 NOTE — Patient Instructions (Signed)
Standing Labs We placed an order today for your standing lab work.   Please have your standing labs drawn in 1 month, 3 months, and every 5 months   If possible, please have your labs drawn 2 weeks prior to your appointment so that the provider can discuss your results at your appointment.  Please note that you may see your imaging and lab results in [place] before we have reviewed them. We may be awaiting multiple results to interpret others before contacting you. Please allow our office up to 72 hours to thoroughly review all of the results before contacting the office for clarification of your results.  We have open lab daily: Monday through Thursday from 1:30-4:30 PM and Friday from 1:30-4:00 PM at the office of Dr. Bo Merino, [facility].   Please be advised, all patients with office appointments requiring lab work will take precedent over walk-in lab work.  If possible, please come for your lab work on Monday and Friday afternoons, as you may experience shorter wait times. The office is located at [address] [place], [place], [place] [address] No appointment is necessary.   Labs are drawn by [facility]. Please bring your co-pay at the time of your lab draw.  You may receive a bill from [facility] for your lab work.  If you wish to have your labs drawn at another location, please call the office 24 hours in advance to send orders.  If you have any questions regarding directions or hours of operation,  please call [phone].   As a reminder, please drink plenty of water prior to coming for your lab work. Thanks!   Hydroxychloroquine tablets What is this medication? HYDROXYCHLOROQUINE (hye drox ee KLOR oh kwin) is used to treat rheumatoidarthritis and systemic lupus erythematosus. It is also used to treat malaria. This medicine may be used for other purposes; ask your health care provider orpharmacist if you have questions. COMMON BRAND NAME(S): Plaquenil,  Quineprox What should I tell my care team before I take this medication? They need to know if you have any of these conditions: diabetes eye disease, vision problems G6PD deficiency heart disease history of irregular heartbeat if you often drink alcohol kidney disease liver disease porphyria psoriasis an unusual or allergic reaction to chloroquine, hydroxychloroquine, other medicines, foods, dyes, or preservatives pregnant or trying to get pregnant breast-feeding How should I use this medication? Take this medicine by mouth with a glass of water. Take it as directed on the prescription label. Do not cut, crush or chew this medicine. Swallow the tablets whole. Take it with food. Do not take it more than directed. Take all of this medicine unless your health care provider tells you to stop it early.Keep taking it even if you think you are better. Take products with antacids in them at a different time of day than this medicine. Take this medicine 4 hours before or 4 hours after antacids. Talk toyour health care provider if you have questions. Talk to your pediatrician regarding the use of this medicine in children. Whilethis drug may be prescribed for selected conditions, precautions do apply. Overdosage: If you think you have taken too much of this medicine contact apoison control center or emergency room at once. NOTE: This medicine is only for you. Do not share this medicine with others. What if I miss a dose? If you miss a dose, take it as soon as you can. If it is almost time for yournext dose, take only that dose.  Do not take double or extra doses. What may interact with this medication? Do not take this medicine with any of the following medications: cisapride dronedarone pimozide thioridazine This medicine may also interact with the following medications: ampicillin antacids cimetidine cyclosporine digoxin kaolin medicines for diabetes, like insulin, glipizide,  glyburide medicines for seizures like carbamazepine, phenobarbital, phenytoin mefloquine methotrexate other medicines that prolong the QT interval (cause an abnormal heart rhythm) praziquantel This list may not describe all possible interactions. Give your health care provider a list of all the medicines, herbs, non-prescription drugs, or dietary supplements you use. Also tell them if you smoke, drink alcohol, or use illegaldrugs. Some items may interact with your medicine. What should I watch for while using this medication? Visit your health care provider for regular checks on your progress. Tell your health care provider if your symptoms do not start to get better or if they getworse. You may need blood work done while you are taking this medicine. If you take other medicines that can affect heart rhythm, you may need more testing. Talkto your health care provider if you have questions. Your vision may be tested before and during use of this medicine. Tell yourhealth care provider right away if you have any change in your eyesight. This medicine may cause serious skin reactions. They can happen weeks to months after starting the medicine. Contact your health care provider right away if you notice fevers or flu-like symptoms with a rash. The rash may be red or purple and then turn into blisters or peeling of the skin. Or, you might notice a red rash with swelling of the face, lips or lymph nodes in your neck or underyour arms. If you or your family notice any changes in your behavior, such as new or worsening depression, thoughts of harming yourself, anxiety, or other unusual or disturbing thoughts, or memory loss, call your health care provider rightaway. What side effects may I notice from receiving this medication? Side effects that you should report to your doctor or health care professionalas soon as possible: allergic reactions (skin rash, itching or hives; swelling of the face, lips, or  tongue) changes in vision decreased hearing, ringing in the ears heartbeat rhythm changes (trouble breathing; chest pain; dizziness; fast, irregular heartbeat; feeling faint or lightheaded, falls) liver injury (dark yellow or brown urine; general ill feeling or flu-like symptoms; loss of appetite, right upper belly pain; unusually weak or tired, yellowing of the eyes or skin) low blood sugar (feeling anxious; confusion; dizziness; increased hunger; unusually weak or tired; increased sweating; shakiness; cold, clammy skin; irritable; headache; blurred vision; fast heartbeat; loss of consciousness) low red blood cell counts (trouble breathing; feeling faint; lightheaded, falls; unusually weak or tired) muscle weakness pain, tingling, numbness in the hands or feet rash, fever, and swollen lymph nodes redness, blistering, peeling or loosening of the skin, including inside the mouth suicidal thoughts, mood changes uncontrollable head, mouth, neck, arm, or leg movements unusual bruising or bleeding Side effects that usually do not require medical attention (report to yourdoctor or health care professional if they continue or are bothersome): diarrhea hair loss irritable This list may not describe all possible side effects. Call your doctor for medical advice about side effects. You may report side effects to FDA at1-800-FDA-1088. Where should I keep my medication? Keep out of the reach of children and pets. Store at room temperature up to 30 degrees C (86 degrees F). Protect fromlight. Get rid of any unused medicine after the  expiration date. To get rid of medicines that are no longer needed or have expired: Take the medicine to a medicine take-back program. Check with your pharmacy or law enforcement to find a location. If you cannot return the medicine, check the label or package insert to see if the medicine should be thrown out in the garbage or flushed down the toilet. If you are not sure, ask  your health care provider. If it is safe to put it in the trash, empty the medicine out of the container. Mix the medicine with cat litter, dirt, coffee grounds, or other unwanted substance. Seal the mixture in a bag or container. Put it in the trash. NOTE: This sheet is a summary. It may not cover all possible information. If you have questions about this medicine, talk to your doctor, pharmacist, orhealth care provider.  2022 Elsevier/Gold Standard (2020-03-18 15:07:49)

## 2021-05-06 NOTE — Progress Notes (Signed)
Pharmacy Note  Subjective: Patient presents today to [facility] for follow up office visit.   Patient seen by the pharmacist for counseling on hydroxychloroquine for  positive ANA  Objective: CMP     Component Value Date/Time   NA 143 04/18/2021 0905   K 4.4 04/18/2021 0905   CL 105 04/18/2021 0905   CO2 32 04/18/2021 0905   GLUCOSE 104 (H) 04/18/2021 0905   BUN 10 04/18/2021 0905   CREATININE 1.06 (H) 04/18/2021 0905   CALCIUM 9.3 04/18/2021 0905   PROT 7.0 04/18/2021 0905   ALBUMIN 3.7 02/02/2021 1434   AST 22 04/18/2021 0905   ALT 31 (H) 04/18/2021 0905   ALKPHOS 47 02/02/2021 1434   BILITOT 0.4 04/18/2021 0905   GFRNONAA 57 (L) 04/18/2021 0905   GFRAA 66 04/18/2021 0905    CBC    Component Value Date/Time   WBC 5.5 04/18/2021 0905   RBC 4.20 04/18/2021 0905   HGB 12.1 04/18/2021 0905   HCT 37.9 04/18/2021 0905   PLT 245 04/18/2021 0905   MCV 90.2 04/18/2021 0905   MCH 28.8 04/18/2021 0905   MCHC 31.9 (L) 04/18/2021 0905   RDW 14.4 04/18/2021 0905   LYMPHSABS 2,354 04/18/2021 0905   MONOABS 0.8 06/19/2013 1057   EOSABS 171 04/18/2021 0905   BASOSABS 22 04/18/2021 0905    Assessment/Plan: Patient was counseled on the purpose, proper use, and adverse effects of hydroxychloroquine including nausea/diarrhea, skin rash, headaches, and sun sensitivity.  Discussed importance of annual eye exams while on hydroxychloroquine to monitor to ocular toxicity and discussed importance of frequent laboratory monitoring.  Provided patient with eye exam form for baseline ophthalmologic exam and standing lab instructions.  Provided patient with educational materials on hydroxychloroquine and answered all questions.  Patient consented to hydroxychloroquine.  Will upload consent in the media tab.    She has appointment with eye doc today but is unsure if they ophthalmologist or optometrist. She will take the eye exam form to her appointment that is this week to see if they  are able to complete this exam. She also sees a provider for her diabetic retinopathy who she has been advised will likely be able to complete this exam otherwise. She verbalized understanding.  She takes daily low-dose aspirin.  Dose will be Plaquenil 200 mg twice daily based on weight.  Prescription [facility] [address].  Knox Saliva, PharmD, MPH, BCPS Clinical Pharmacist (Rheumatology and Pulmonology)

## 2021-05-07 ENCOUNTER — Telehealth: Payer: Self-pay | Admitting: Physician Assistant

## 2021-05-07 NOTE — Telephone Encounter (Signed)
Received a new hem referral from Dr. Estanislado Pandy for hx of dvt/anticardiolipin antibody positive. [name] has been cld and scheduled to see [name] on 8/9 at 9am. Pt aware to arrive 20 minutes early.

## 2021-05-20 ENCOUNTER — Inpatient Hospital Stay: Payer: Medicaid Other

## 2021-05-20 ENCOUNTER — Inpatient Hospital Stay: Payer: Medicaid Other | Attending: Physician Assistant | Admitting: Physician Assistant

## 2021-05-20 ENCOUNTER — Encounter: Payer: Self-pay | Admitting: Physician Assistant

## 2021-05-20 ENCOUNTER — Other Ambulatory Visit: Payer: Self-pay

## 2021-05-20 DIAGNOSIS — R760 Raised antibody titer: Secondary | ICD-10-CM | POA: Insufficient documentation

## 2021-05-20 DIAGNOSIS — Z87891 Personal history of nicotine dependence: Secondary | ICD-10-CM | POA: Insufficient documentation

## 2021-05-20 DIAGNOSIS — Z86718 Personal history of other venous thrombosis and embolism: Secondary | ICD-10-CM | POA: Diagnosis not present

## 2021-05-20 DIAGNOSIS — Z7901 Long term (current) use of anticoagulants: Secondary | ICD-10-CM | POA: Diagnosis not present

## 2021-05-20 DIAGNOSIS — Z801 Family history of malignant neoplasm of trachea, bronchus and lung: Secondary | ICD-10-CM | POA: Insufficient documentation

## 2021-05-20 LAB — CBC WITH DIFFERENTIAL (CANCER CENTER ONLY)
Abs Immature Granulocytes: 0.02 10*3/uL (ref 0.00–0.07)
Basophils Absolute: 0 10*3/uL (ref 0.0–0.1)
Basophils Relative: 1 %
Eosinophils Absolute: 0.2 10*3/uL (ref 0.0–0.5)
Eosinophils Relative: 3 %
HCT: 36 % (ref 36.0–46.0)
Hemoglobin: 12.4 g/dL (ref 12.0–15.0)
Immature Granulocytes: 0 %
Lymphocytes Relative: 36 %
Lymphs Abs: 2.3 10*3/uL (ref 0.7–4.0)
MCH: 30.4 pg (ref 26.0–34.0)
MCHC: 34.4 g/dL (ref 30.0–36.0)
MCV: 88.2 fL (ref 80.0–100.0)
Monocytes Absolute: 0.6 10*3/uL (ref 0.1–1.0)
Monocytes Relative: 9 %
Neutro Abs: 3.2 10*3/uL (ref 1.7–7.7)
Neutrophils Relative %: 51 %
Platelet Count: 251 10*3/uL (ref 150–400)
RBC: 4.08 MIL/uL (ref 3.87–5.11)
RDW: 13.5 % (ref 11.5–15.5)
WBC Count: 6.3 10*3/uL (ref 4.0–10.5)
nRBC: 0 % (ref 0.0–0.2)

## 2021-05-20 LAB — CMP (CANCER CENTER ONLY)
ALT: 59 U/L — ABNORMAL HIGH (ref 0–44)
AST: 32 U/L (ref 15–41)
Albumin: 3.9 g/dL (ref 3.5–5.0)
Alkaline Phosphatase: 60 U/L (ref 38–126)
Anion gap: 8 (ref 5–15)
BUN: 11 mg/dL (ref 6–20)
CO2: 32 mmol/L (ref 22–32)
Calcium: 9.6 mg/dL (ref 8.9–10.3)
Chloride: 105 mmol/L (ref 98–111)
Creatinine: 1.09 mg/dL — ABNORMAL HIGH (ref 0.44–1.00)
GFR, Estimated: 58 mL/min — ABNORMAL LOW (ref >60)
Glucose, Bld: 91 mg/dL (ref 70–99)
Potassium: 4.1 mmol/L (ref 3.5–5.1)
Sodium: 145 mmol/L (ref 135–145)
Total Bilirubin: 0.3 mg/dL (ref 0.3–1.2)
Total Protein: 7.8 g/dL (ref 6.5–8.1)

## 2021-05-20 NOTE — Progress Notes (Signed)
[place] Telephone:(336) [phone]   Fax:(336) [place] NOTE  Patient Care Team: Riki Sheer, NP as PCP - General (Nurse Practitioner) Terance Ice, MD (Inactive) as Referring Physician (Cardiology)  Hematological/Oncological History 1) 11/29/2010: Left Leg Doppler US after patient presented with pain and swelling in left lower extremity. Findings revealed non occlusive acute DVT involving the posterior tibial and popliteal veins of the left lower extremity. Treated with lovenox bridge and then transitioned to coumadin. Patient reports taking coumadin for two years.   2) 04/18/2021: Anticardiolipin IgG >112.0, Beta-2 Glycoprotein IgG 103.5  3) 05/20/2021: Establish care with Dede Query PA-C  CHIEF COMPLAINTS/PURPOSE OF CONSULTATION:  "Elevated anticardiolipin and beta-2 glycoprotein antibodies "  HISTORY OF PRESENTING ILLNESS:  [name] 60 y.o. female with medical history significant for anxiety arthritis, dysrhythmia, fibroids status post hysterectomy, hypertension hyperlipidemia and obesity.  Patient is unaccompanied for this visit.   On exam today, Ms. Nicoll reports her energy levels are relatively stable.  She is able to complete her ADLs on her own.  Patient uses a cane to ambulate.  Patient has a good appetite without any dietary restrictions.  She denies any nausea, vomiting or abdominal pain.  She recently started Plaquenil that because of acid reflux which improved after taking Pepto-Bismol.  She has regular bowel movements without any diarrhea or constipation.  She denies easy bruising or signs of bleeding.  This includes hematochezia, melena, hemoptysis, epistaxis, hematuria or gingival bleeding.  She denies any fevers, chills, night sweats, shortness of breath, chest pain or cough.  No complaints.  Rest of the 10 point ROS is below.  MEDICAL HISTORY:  Past Medical History:  Diagnosis Date   Anemia    Anxiety     Arthritis    Diabetes mellitus without complication ([place])    recent dx diet controlled   DVT (deep venous thrombosis) (H[facility])    remote - took coumadin   Dysmenorrhea    Dysrhythmia    irregular heart beat   Fibroid    GERD (gastroesophageal reflux disease)    hx of   Gout    Hypercholesteremia    Hypertension 4 months   Menometrorrhagia    Morbid obesity ([place])    Prediabetes    Substance abuse ([place])     SURGICAL HISTORY: Past Surgical History:  Procedure Laterality Date   ABDOMINAL HYSTERECTOMY  2008   BREAST BIOPSY Left 2013   benign   BUNIONECTOMY Bilateral    CHOLECYSTECTOMY     COLONOSCOPY WITH PROPOFOL N/A 10/27/2018   Procedure: COLONOSCOPY WITH PROPOFOL;  Surgeon: Wonda Horner, MD;  Location: [facility];  Service: Endoscopy;  Laterality: N/A;   ECTOPIC PREGNANCY SURGERY  1980's   MANDIBLE OSTEOTOMY Bilateral 07/03/2013   Procedure: BILATERAL TORI;  Surgeon: Gae Bon, DDS;  Location: [facility];  Service: Oral Surgery;  Laterality: Bilateral;   NM MYOCAR PERF WALL MOTION  07/2012   lexiscan - normal pattern of perfusion, low risk   ROTATOR CUFF REPAIR Bilateral 2009   SLEEP STUDY  07/27/2012   AHI 4.8/hr   TOOTH EXTRACTION N/A 07/03/2013   Procedure: DENTAL EXTRACTION # 20;  Surgeon: Gae Bon, DDS;  Location: [facility];  Service: Oral Surgery;  Laterality: N/A;   TRANSTHORACIC ECHOCARDIOGRAM  07/2012   EF=>55%, mod conc LVH; LA mod dilated; trace MR; mild TR with normal RVSP; trace AV regurg   TUBAL LIGATION      SOCIAL HISTORY: Social History  Socioeconomic History   Marital status: Single    Spouse name: Not on file   Number of children: 6   Years of education: 80   Highest education level: Not on file  Occupational History    Employer: UNEMPLOYED    Comment: Disability  Tobacco Use   Smoking status: Former    Packs/day: 0.10    Years: 6.00    Pack years: 0.60    Types: Cigarettes    Quit date: 02/14/2011    Years since quitting: 10.2    Smokeless tobacco: Never  Vaping Use   Vaping Use: Never used  Substance and Sexual Activity   Alcohol use: No    Comment: Previous ETOH abuse   Drug use: No    Comment: Previous crack use    Sexual activity: Never    Birth control/protection: Abstinence  Other Topics Concern   Not on file  Social History Narrative   Not on file   Social Determinants of Health   Financial Resource Strain: Not on file  Food Insecurity: Not on file  Transportation Needs: Not on file  Physical Activity: Not on file  Stress: Not on file  Social Connections: Not on file  Intimate Partner Violence: Not on file    FAMILY HISTORY: Family History  Problem Relation Age of Onset   Diabetes Mother    Hypertension Mother    Cancer Mother    Deep vein thrombosis Mother        and PE   Diabetes Father    Hypertension Father    Cancer Father 11   Throat cancer Brother 55   Pulmonary embolism Daughter    Heart disease Other        No family history    ALLERGIES:  is allergic to buprenorphine hcl-naloxone hcl, naloxone, and dilaudid [hydromorphone hcl].  MEDICATIONS:  Current Outpatient Medications  Medication Sig Dispense Refill   allopurinol (ZYLOPRIM) 100 MG tablet Take 100 mg by mouth daily.     aspirin 81 MG chewable tablet Chew 81 mg by mouth daily.     atorvastatin (LIPITOR) 40 MG tablet Take 40 mg by mouth daily.     Blood Glucose Monitoring Suppl (ACCU-CHEK GUIDE ME) w/Device KIT      buPROPion (WELLBUTRIN XL) 300 MG 24 hr tablet Take 300 mg by mouth at bedtime.     cholecalciferol (VITAMIN D3) 25 MCG (1000 UT) tablet Take 2,000 Units by mouth daily.     [facility] MAGNESIUM OXIDE 250 MG TABS Take 1 tablet by mouth daily.     cyclobenzaprine (FLEXERIL) 10 MG tablet Take 10 mg by mouth 2 (two) times daily as needed for muscle spasms.      cyclobenzaprine (FLEXERIL) 5 MG tablet Take 5 mg by mouth 2 (two) times daily.     diclofenac sodium (VOLTAREN) 1 % GEL Apply 4 g topically 4 (four) times  daily. 100 g 0   Diethylpropion HCl CR 75 MG TB24 Take 1 tablet by mouth daily.     furosemide (LASIX) 20 MG tablet Take 20 mg by mouth daily.     gabapentin (NEURONTIN) 400 MG capsule Take 400 mg by mouth 3 (three) times daily.     glucose blood test strip TEST GLUCOSE TWICE A DAY     lidocaine (XYLOCAINE) 5 % ointment Apply 1 application topically as needed for moderate pain.     loratadine (CLARITIN) 10 MG tablet Take 10 mg by mouth daily.     MELATONIN PO Take  by mouth at bedtime as needed.     meloxicam (MOBIC) 15 MG tablet Take 15 mg by mouth daily.      metoprolol succinate (TOPROL-XL) 50 MG 24 hr tablet Take 50 mg by mouth daily. Take with or immediately following a meal.     morphine (MS CONTIN) 15 MG 12 hr tablet Take 15 mg by mouth 2 (two) times daily.     naloxegol oxalate (MOVANTIK) 25 MG TABS tablet Take 25 mg by mouth daily.      NARCAN 4 MG/0.1ML LIQD nasal spray kit SMARTSIG:1 Spray(s) Both Nares Once PRN     oxyCODONE-acetaminophen (PERCOCET) 10-325 MG tablet Take 1 tablet by mouth 4 (four) times daily.      OZEMPIC, 0.25 OR 0.5 MG/DOSE, 2 MG/1.5ML SOPN Inject into the skin.     polyethylene glycol (MIRALAX / GLYCOLAX) packet Take 17 g by mouth daily as needed for moderate constipation.     potassium chloride SA (K-DUR,KLOR-CON) 20 MEQ tablet Take 20 mEq by mouth daily.     ROGAINE MENS EXTRA STRENGTH 5 % FOAM Apply 1 application topically daily.     Vitamin D, Ergocalciferol, (DRISDOL) 1.25 MG (50000 UNIT) CAPS capsule Take 50,000 Units by mouth once a week.     gabapentin (NEURONTIN) 300 MG capsule Take 300 mg by mouth 3 (three) times daily.     hydroxychloroquine (PLAQUENIL) 200 MG tablet Take 1 tablet (200 mg total) by mouth 2 (two) times daily. (Patient not taking: Reported on 05/20/2021) 180 tablet 0   Morphine Sulfate ER 30 MG T12A Take 30 mg by mouth every 12 (twelve) hours.     No current facility-administered medications for this visit.    REVIEW OF SYSTEMS:    Constitutional: ( - ) fevers, ( - )  chills , ( - ) night sweats Eyes: ( - ) blurriness of vision, ( - ) double vision, ( - ) watery eyes Ears, nose, mouth, throat, and face: ( - ) mucositis, ( - ) sore throat Respiratory: ( - ) cough, ( - ) dyspnea, ( - ) wheezes Cardiovascular: ( - ) palpitation, ( - ) chest discomfort, ( - ) lower extremity swelling Gastrointestinal:  ( - ) nausea, ( - ) heartburn, ( - ) change in bowel habits Skin: ( - ) abnormal skin rashes Lymphatics: ( - ) new lymphadenopathy, ( - ) easy bruising Neurological: ( - ) numbness, ( - ) tingling, ( - ) new weaknesses Behavioral/Psych: ( - ) mood change, ( - ) new changes  All other systems were reviewed with the patient and are negative.  PHYSICAL EXAMINATION: ECOG PERFORMANCE STATUS: 1 - Symptomatic but completely ambulatory  Vitals:   05/20/21 0818  BP: (!) 111/58  Pulse: 82  Resp: 17  Temp: 98.3 F (36.8 C)  SpO2: 96%   Filed Weights   05/20/21 0818  Weight: (!) 305 lb 14.4 oz (138.8 kg)    GENERAL: well appearing female in NAD  SKIN: skin color, texture, turgor are normal, no rashes or significant lesions EYES: conjunctiva are pink and non-injected, sclera clear OROPHARYNX: no exudate, no erythema; lips, buccal mucosa, and tongue normal  NECK: supple, non-tender LYMPH:  no palpable lymphadenopathy in the cervical, supraclavicular lymph nodes.  LUNGS: clear to auscultation and percussion with normal breathing effort HEART: regular rate & rhythm and no murmurs and no lower extremity edema ABDOMEN: soft, non-tender, non-distended, normal bowel sounds Musculoskeletal: no cyanosis of digits and no clubbing  PSYCH: alert &  oriented x 3, fluent speech NEURO: no focal motor/sensory deficits  LABORATORY DATA:  I have reviewed the data as listed CBC Latest Ref Rng & Units 04/18/2021 02/02/2021 12/26/2017  WBC 3.8 - 10.8 Thousand/uL 5.5 6.8 -  Hemoglobin 11.7 - 15.5 g/dL 12.1 12.4 13.9  Hematocrit 35.0 - 45.0  % 37.9 37.0 41.0  Platelets 140 - 400 Thousand/uL 245 254 -    CMP Latest Ref Rng & Units 04/18/2021 02/02/2021 12/26/2017  Glucose 65 - 99 mg/dL 104(H) 94 115(H)  BUN 7 - 25 mg/dL 10 12 5(L)  Creatinine 0.50 - 0.99 mg/dL 1.06(H) 0.95 0.70  Sodium 135 - 146 mmol/L 143 140 143  Potassium 3.5 - 5.3 mmol/L 4.4 4.4 4.1  Chloride 98 - 110 mmol/L 105 109 104  CO2 20 - 32 mmol/L 32 26 -  Calcium 8.6 - 10.4 mg/dL 9.3 8.2(L) -  Total Protein 6.1 - 8.1 g/dL 7.0 7.4 -  Total Bilirubin 0.2 - 1.2 mg/dL 0.4 0.7 -  Alkaline Phos 38 - 126 U/L - 47 -  AST 10 - 35 U/L 22 46(H) -  ALT 6 - 29 U/L 31(H) 32 -   ASSESSMENT & PLAN [name] is a 60 y.o. female who presents for evaluation for elevated Anticardiolipin and Beta-2 Glycoprotein antibodies. I reviewed lab results from 04/18/2021 that showed elevated beta-2 glycoprotein IgG at 103.5 and anticardiolipin IgG >112.0.   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 Discussed that with antiphospholipid antibody syndrome and with history of prior VTE, the recommendation would be indefinite anticoagulation with coumadin. Patient wlil proceed with labs today to check CBC, CMP and Lupus anticoagulant panel. To confirm diagnosis, patient will need to return in 12 weeks and repeat antibody levels. It is reasonable to initiate anticoagulation at this time and patient is agreeable to do so. I will send a prescription for Lovenox 1 mg/kg x 7 days to bridge with coumadin.   #Positive antiphospholipid antibodies: -Labs from 04/18/2021 showed elevated beta-2 glycoprotein IgG at 103.5 and anticardiolipin IgG >112.0.  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 -Labs today to check CBC, CMP and lupus anticoagulant panel -Start anticoagulation with coumadin 5 mg once daily. Bridge with Lovenox 1 mg/kg BID x 7 days.  -Need to refer to coumadin clinic. We will reach out to  patient's PCP for assistance. If unable to get scheduled for PCP, then we will refer patient to  [facility] cardiology. -RTC in 12 weeks with repeat labs.   Orders Placed This Encounter  Procedures   CBC with Differential ([facility] Only)    Standing Status:   Future    Standing Expiration Date:   05/20/2022   CMP ([place] only)    Standing Status:   Future    Standing Expiration Date:   05/20/2022   Lupus anticoagulant panel*    Standing Status:   Future    Standing Expiration Date:   05/20/2022    All questions were answered. The patient knows to call the clinic with any problems, questions or concerns.  I have spent a total of 60 minutes minutes of face-to-face and non-face-to-face time, preparing to see the patient, obtaining and/or reviewing separately obtained history, performing a medically appropriate examination, counseling and educating the patient, ordering medications/tests, referring and communicating with other health care professionals, documenting clinical information in the electronic health record, and care coordination.   Dede Query, PA-C Department  of Hematology/Oncology [place] [facility] Phone: [phone]

## 2021-05-21 DIAGNOSIS — R760 Raised antibody titer: Secondary | ICD-10-CM | POA: Insufficient documentation

## 2021-05-21 MED ORDER — ENOXAPARIN SODIUM 150 MG/ML IJ SOSY: 135.0000 mg | PREFILLED_SYRINGE | Freq: Two times a day (BID) | INTRAMUSCULAR | 0 refills | Status: DC

## 2021-05-21 MED ORDER — WARFARIN SODIUM 2.5 MG PO TABS: 2.5000 mg | ORAL_TABLET | Freq: Every day | ORAL | 3 refills | Status: DC

## 2021-05-22 ENCOUNTER — Telehealth: Payer: Self-pay | Admitting: Physician Assistant

## 2021-05-22 NOTE — Telephone Encounter (Signed)
Scheduled appt per 8/10 sch msg. Pt aware.  

## 2021-05-23 ENCOUNTER — Other Ambulatory Visit: Payer: Self-pay

## 2021-05-23 ENCOUNTER — Inpatient Hospital Stay: Payer: Medicaid Other

## 2021-05-23 ENCOUNTER — Telehealth: Payer: Self-pay | Admitting: Physician Assistant

## 2021-05-23 DIAGNOSIS — R760 Raised antibody titer: Secondary | ICD-10-CM

## 2021-05-23 LAB — LUPUS ANTICOAGULANT PANEL
DRVVT: 42.4 s (ref 0.0–47.0)
PTT Lupus Anticoagulant: 34.9 s (ref 0.0–51.9)

## 2021-05-23 LAB — PROTIME-INR
INR: 1 (ref 0.8–1.2)
Prothrombin Time: 13.2 seconds (ref 11.4–15.2)

## 2021-05-26 ENCOUNTER — Telehealth: Payer: Self-pay | Admitting: *Deleted

## 2021-05-26 NOTE — Telephone Encounter (Signed)
Received call from G[place] at [facility].  She states that pt's PCP will NOT manage her coumadin or PT/INR. They will refer pt back to Dr. Mardene Speak, cardiologist @ [facility]  Pt called and made her aware not to start her coumadin until she has been seen by Cardiologist. She voiced understanding . She has gotten a call from Dr. Raul Del office and she will see him on Thursday, 05/29/21  Advised to call back if that does not work out the way she expects it to.  Pt voiced understanding.

## 2021-05-27 ENCOUNTER — Encounter (INDEPENDENT_AMBULATORY_CARE_PROVIDER_SITE_OTHER): Payer: Self-pay | Admitting: Ophthalmology

## 2021-05-27 ENCOUNTER — Other Ambulatory Visit: Payer: Self-pay

## 2021-05-27 ENCOUNTER — Ambulatory Visit (INDEPENDENT_AMBULATORY_CARE_PROVIDER_SITE_OTHER): Payer: Medicaid Other | Admitting: Ophthalmology

## 2021-05-27 DIAGNOSIS — H2513 Age-related nuclear cataract, bilateral: Secondary | ICD-10-CM | POA: Insufficient documentation

## 2021-05-27 DIAGNOSIS — E119 Type 2 diabetes mellitus without complications: Secondary | ICD-10-CM | POA: Insufficient documentation

## 2021-05-27 DIAGNOSIS — H43823 Vitreomacular adhesion, bilateral: Secondary | ICD-10-CM | POA: Insufficient documentation

## 2021-05-27 NOTE — Assessment & Plan Note (Signed)

## 2021-05-27 NOTE — Assessment & Plan Note (Signed)

## 2021-05-27 NOTE — Progress Notes (Signed)
05/27/2021     CHIEF COMPLAINT Patient presents for Retina Follow Up   HISTORY OF PRESENT ILLNESS: [name] is a 60 y.o. female who presents to the clinic today for:   HPI     Retina Follow Up           Diagnosis: Diabetic Retinopathy   Laterality: both eyes   Onset: 2 years ago   Duration: 2 years   Course: stable         Comments   2 yr FU OCT OU OD flashes of light twice a day during normal daily routine. FOL ongoing for 5 months.  The flashes of light tend to be horizontal in nature will central vision in the right eye.  Denies floaters. Pt. states her vision has changed but she recently got new glasses. She hasn't picked them up yet.  Type 2 Diabetic, 5 yrs.  A1C: 6.0 controlled diet BS: 106 this morning      Last edited by Edmon Crape, MD on 05/27/2021 10:09 AM.      Referring physician: Olivia Canter, MD [address] [place],  [place] [address]  HISTORICAL INFORMATION:   Selected notes from the MEDICAL RECORD NUMBER    Lab Results  Component Value Date   HGBA1C 6.4 (H) 06/19/2013     CURRENT MEDICATIONS: No current outpatient medications on file. (Ophthalmic Drugs)   No current facility-administered medications for this visit. (Ophthalmic Drugs)   Current Outpatient Medications (Other)  Medication Sig   allopurinol (ZYLOPRIM) 100 MG tablet Take 100 mg by mouth daily.   aspirin 81 MG chewable tablet Chew 81 mg by mouth daily.   atorvastatin (LIPITOR) 40 MG tablet Take 40 mg by mouth daily.   Blood Glucose Monitoring Suppl (ACCU-CHEK GUIDE ME) w/Device KIT    buPROPion (WELLBUTRIN XL) 300 MG 24 hr tablet Take 300 mg by mouth at bedtime.   cholecalciferol (VITAMIN D3) 25 MCG (1000 UT) tablet Take 2,000 Units by mouth daily.   [employer] MAGNESIUM OXIDE 250 MG TABS Take 1 tablet by mouth daily.   cyclobenzaprine (FLEXERIL) 10 MG tablet Take 10 mg by mouth 2 (two) times daily as needed for muscle spasms.    cyclobenzaprine  (FLEXERIL) 5 MG tablet Take 5 mg by mouth 2 (two) times daily.   diclofenac sodium (VOLTAREN) 1 % GEL Apply 4 g topically 4 (four) times daily.   Diethylpropion HCl CR 75 MG TB24 Take 1 tablet by mouth daily.   enoxaparin (LOVENOX) 150 MG/ML injection Inject 0.9 mLs (135 mg total) into the skin every 12 (twelve) hours for 7 days.   furosemide (LASIX) 20 MG tablet Take 20 mg by mouth daily.   gabapentin (NEURONTIN) 300 MG capsule Take 300 mg by mouth 3 (three) times daily.   gabapentin (NEURONTIN) 400 MG capsule Take 400 mg by mouth 3 (three) times daily.   glucose blood test strip TEST GLUCOSE TWICE A DAY   hydroxychloroquine (PLAQUENIL) 200 MG tablet Take 1 tablet (200 mg total) by mouth 2 (two) times daily. (Patient not taking: Reported on 05/20/2021)   lidocaine (XYLOCAINE) 5 % ointment Apply 1 application topically as needed for moderate pain.   loratadine (CLARITIN) 10 MG tablet Take 10 mg by mouth daily.   MELATONIN PO Take by mouth at bedtime as needed.   meloxicam (MOBIC) 15 MG tablet Take 15 mg by mouth daily.    metoprolol succinate (TOPROL-XL) 50 MG 24 hr tablet Take 50  mg by mouth daily. Take with or immediately following a meal.   morphine (MS CONTIN) 15 MG 12 hr tablet Take 15 mg by mouth 2 (two) times daily.   Morphine Sulfate ER 30 MG T12A Take 30 mg by mouth every 12 (twelve) hours.   naloxegol oxalate (MOVANTIK) 25 MG TABS tablet Take 25 mg by mouth daily.    NARCAN 4 MG/0.1ML LIQD nasal spray kit SMARTSIG:1 Spray(s) Both Nares Once PRN   oxyCODONE-acetaminophen (PERCOCET) 10-325 MG tablet Take 1 tablet by mouth 4 (four) times daily.    OZEMPIC, 0.25 OR 0.5 MG/DOSE, 2 MG/1.5ML SOPN Inject into the skin.   polyethylene glycol (MIRALAX / GLYCOLAX) packet Take 17 g by mouth daily as needed for moderate constipation.   potassium chloride SA (K-DUR,KLOR-CON) 20 MEQ tablet Take 20 mEq by mouth daily.   ROGAINE MENS EXTRA STRENGTH 5 % FOAM Apply 1 application topically daily.    Vitamin D, Ergocalciferol, (DRISDOL) 1.25 MG (50000 UNIT) CAPS capsule Take 50,000 Units by mouth once a week.   warfarin (COUMADIN) 2.5 MG tablet Take 1 tablet (2.5 mg total) by mouth daily.   No current facility-administered medications for this visit. (Other)      REVIEW OF SYSTEMS:    ALLERGIES Allergies  Allergen Reactions   Buprenorphine Hcl-Naloxone Hcl Hives and Shortness Of Breath   Naloxone Hives and Shortness Of Breath   Dilaudid [Hydromorphone Hcl] Hives    PAST MEDICAL HISTORY Past Medical History:  Diagnosis Date   Anemia    Anxiety    Arthritis    Diabetes mellitus without complication ([place])    recent dx diet controlled   DVT (deep venous thrombosis) (HCC)    remote - took coumadin   Dysmenorrhea    Dysrhythmia    irregular heart beat   Fibroid    GERD (gastroesophageal reflux disease)    hx of   Gout    Hypercholesteremia    Hypertension 4 months   Menometrorrhagia    Morbid obesity ([place])    Prediabetes    Substance abuse ([place])    Past Surgical History:  Procedure Laterality Date   ABDOMINAL HYSTERECTOMY  2008   BREAST BIOPSY Left 2013   benign   BUNIONECTOMY Bilateral    CHOLECYSTECTOMY     COLONOSCOPY WITH PROPOFOL N/A 10/27/2018   Procedure: COLONOSCOPY WITH PROPOFOL;  Surgeon: Wonda Horner, MD;  Location: [facility];  Service: Endoscopy;  Laterality: N/A;   ECTOPIC PREGNANCY SURGERY  1980's   MANDIBLE OSTEOTOMY Bilateral 07/03/2013   Procedure: BILATERAL TORI;  Surgeon: Gae Bon, DDS;  Location: [facility];  Service: Oral Surgery;  Laterality: Bilateral;   NM MYOCAR PERF WALL MOTION  07/2012   lexiscan - normal pattern of perfusion, low risk   ROTATOR CUFF REPAIR Bilateral 2009   SLEEP STUDY  07/27/2012   AHI 4.8/hr   TOOTH EXTRACTION N/A 07/03/2013   Procedure: DENTAL EXTRACTION # 20;  Surgeon: Gae Bon, DDS;  Location: [facility];  Service: Oral Surgery;  Laterality: N/A;   TRANSTHORACIC ECHOCARDIOGRAM  07/2012   EF=>55%, mod  conc LVH; LA mod dilated; trace MR; mild TR with normal RVSP; trace AV regurg   TUBAL LIGATION      FAMILY HISTORY Family History  Problem Relation Age of Onset   Diabetes Mother    Hypertension Mother    Cancer Mother    Deep vein thrombosis Mother        and PE   Diabetes Father  Hypertension Father    Cancer Father 20   Throat cancer Brother 17   Pulmonary embolism Daughter    Heart disease Other        No family history    SOCIAL HISTORY Social History   Tobacco Use   Smoking status: Former    Packs/day: 0.10    Years: 6.00    Pack years: 0.60    Types: Cigarettes    Quit date: 02/14/2011    Years since quitting: 10.2   Smokeless tobacco: Never  Vaping Use   Vaping Use: Never used  Substance Use Topics   Alcohol use: No    Comment: Previous ETOH abuse   Drug use: No    Comment: Previous crack use          OPHTHALMIC EXAM:  Base Eye Exam     Visual Acuity (Snellen - Linear)       Right Left   Dist cc 20/30 20/40   Dist ph cc 20/25 -1 20/20 -1         Tonometry (Tonopen, 9:26 AM)       Right Left   Pressure 21 23  Squeezing eyes and pulling away        Pupils       Pupils Dark Light Shape React APD   Right PERRL 5 4 Round Brisk None   Left PERRL 5 4 Round Brisk None         Visual Fields (Counting fingers)       Left Right    Full Full         Extraocular Movement       Right Left    Full Full         Neuro/Psych     Oriented x3: Yes   Mood/Affect: Normal         Dilation     Both eyes: 1.0% Mydriacyl, 2.5% Phenylephrine @ 9:29 AM           Slit Lamp and Fundus Exam     External Exam       Right Left   External Normal Normal         Slit Lamp Exam       Right Left   Lids/Lashes Normal Normal   Conjunctiva/Sclera White and quiet White and quiet   Cornea Clear Clear   Anterior Chamber Deep and quiet Deep and quiet   Iris Round and reactive Round and reactive   Lens 1+ Nuclear sclerosis 1+  Nuclear sclerosis   Anterior Vitreous Normal Normal         Fundus Exam       Right Left   Posterior Vitreous Normal Normal   Disc Normal Normal   C/D Ratio 0.15 0.15   Macula Normal Normal   Vessels Normal, no DR Normal, no DR   Periphery Normal Normal            IMAGING AND PROCEDURES  Imaging and Procedures for 05/27/21  OCT, Retina - OU - Both Eyes       Right Eye Quality was good. Scan locations included subfoveal. Central Foveal Thickness: 260. Progression has been stable. Findings include vitreomacular adhesion , normal foveal contour.   Left Eye Quality was good. Scan locations included subfoveal. Central Foveal Thickness: 269. Progression has been stable. Findings include vitreomacular adhesion , normal foveal contour.   Notes Early posterior vitreous detachment may be developing, OD  ASSESSMENT/PLAN:  Nuclear sclerotic cataract of both eyes The nature of cataract was discussed with the patient as well as the elective nature of surgery. The patient was reassured that surgery at a later date does not put the patient at risk for a worse outcome. It was emphasized that the need for surgery is dictated by the patient's quality of life as influenced by the cataract. Patient was instructed to maintain close follow up with their general eye care doctor.  Diabetes mellitus without complication ([place]) The patient has diabetes without any evidence of retinopathy. The patient advised to maintain good blood glucose control, excellent blood pressure control, and favorable levels of cholesterol, low density lipoprotein, and high density lipoproteins. Follow up in 1 year was recommended. Explained that fluctuations in visual acuity , or "out of focus", may result from large variations of blood sugar control.  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 Vitreomacular adhesion of both eyes  H43.823 OCT, Retina - OU - Both Eyes    2. Nuclear sclerotic cataract of both eyes  H25.13     3. Diabetes mellitus without complication (HCC)  X91.4       1.  Just started on Plaquenil therapy no signs of Plaquenil toxicity at this early stage  2.  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  Ophthalmic Meds Ordered this visit:  No orders of the defined types were placed in this encounter.      Return in about 2 years (around 05/28/2023) for DILATE OU, COLOR FP, OCT.  There are no Patient Instructions on file for this visit.   Explained the diagnoses, plan, and follow up with the patient and they expressed understanding.  Patient expressed understanding of the importance of proper follow up care.   Clent Demark Itai Barbian M.D. Diseases & Surgery of the Retina and Vitreous [facility] 05/27/21     Abbreviations: M myopia (nearsighted); A astigmatism; H hyperopia (farsighted); P presbyopia; Mrx spectacle prescription;  CTL contact lenses; OD right eye; OS left eye; OU both eyes  XT exotropia; ET esotropia; PEK punctate epithelial keratitis; PEE punctate epithelial erosions; DES dry eye syndrome; MGD meibomian gland dysfunction; ATs artificial tears; PFAT's preservative free artificial tears; [place] nuclear sclerotic cataract; PSC posterior subcapsular cataract; ERM epi-retinal membrane; PVD posterior vitreous detachment; RD retinal detachment; DM diabetes mellitus; DR diabetic retinopathy; NPDR non-proliferative diabetic retinopathy; PDR proliferative diabetic retinopathy; CSME clinically significant macular edema; DME diabetic macular edema; dbh dot blot hemorrhages; CWS cotton wool spot; POAG primary open angle glaucoma; C/D cup-to-disc ratio; HVF humphrey visual field; GVF  goldmann visual field; OCT optical coherence tomography; IOP intraocular pressure; BRVO Branch retinal vein occlusion; CRVO central retinal vein occlusion; CRAO central retinal artery occlusion; BRAO branch retinal artery occlusion; RT retinal tear; SB scleral buckle; PPV pars plana vitrectomy; VH Vitreous hemorrhage; PRP panretinal laser photocoagulation; IVK intravitreal kenalog; VMT vitreomacular traction; MH Macular hole;  NVD neovascularization of the disc; NVE neovascularization elsewhere; AREDS age related eye disease study; ARMD age related macular degeneration; POAG primary open angle glaucoma; EBMD epithelial/anterior basement membrane dystrophy; ACIOL anterior chamber intraocular lens; IOL intraocular lens; PCIOL posterior chamber intraocular lens; Phaco/IOL phacoemulsification with intraocular lens placement; [place]  photorefractive keratectomy; LASIK laser assisted in situ keratomileusis; HTN hypertension; DM diabetes mellitus; COPD chronic obstructive pulmonary disease

## 2021-05-27 NOTE — Assessment & Plan Note (Signed)
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

## 2021-05-28 NOTE — Telephone Encounter (Signed)
error 

## 2021-06-02 ENCOUNTER — Telehealth: Payer: Self-pay

## 2021-06-02 NOTE — Telephone Encounter (Signed)
We can discuss other treatment options at her follow up visit.   Please clarify if she has seen the hematologist as recommended.

## 2021-06-02 NOTE — Telephone Encounter (Signed)
Patient advised we can discuss other treatment options at her follow up visit on 07/01/2021.    Patient states she has seen the hematologist as recommended.

## 2021-06-02 NOTE — Telephone Encounter (Signed)
Patient called stating she has decided not to take her Hydroxychloroquine medication anymore.  Patient states she took her last tablets yesterday, 06/01/21.  Patient states her cardiologist and eye doctor don't recommend that she take the medication.

## 2021-06-02 NOTE — Telephone Encounter (Signed)
Attempted to contact the patient and left message for patient to call the office.  

## 2021-06-03 NOTE — Telephone Encounter (Signed)
Reviewed hematology note from 05/20/21.  She will be started on warfarin after bridge therapy with lovenox.  She was referred to the warfarin clinic and cardiology.

## 2021-06-09 ENCOUNTER — Telehealth: Payer: Self-pay | Admitting: Physician Assistant

## 2021-06-09 NOTE — Telephone Encounter (Signed)
I tried to call [name] to confirm if her cardiologist, Dr. Brigitte Pulse, would be able to manage her coumadin dosages. I have asked her not to start coumadin until she was established with a coumadin clinic. I left a voicemail to return my call. I called the patient's daughter, [name], who said that patient is out of town. I gave the cancer center's number so patient can call us back.

## 2021-06-10 ENCOUNTER — Telehealth: Payer: Self-pay | Admitting: Physician Assistant

## 2021-06-10 NOTE — Telephone Encounter (Signed)
I spoke to patient's cardiologist, Dr. Mardene Speak regarding our recommendations to start coumadin in the setting of elevated antiphospholipid antibodies and history of DVT. Dr. Brigitte Pulse said that patient was hesitant to start coumadin but he will reach out to her to further discuss our recommendations. He will follow up with [place] if patient decides not to go on coumadin.

## 2021-06-17 NOTE — Progress Notes (Signed)
Office Visit Note  Patient: [name]             Date of Birth: [birth year]           MRN: [id number]             PCP: Riki Sheer, NP Referring: Riki Sheer, NP Visit Date: 07/01/2021 Occupation: '@GUAROCC' @  Subjective:  Discuss treatment options   History of Present Illness: [name] is a 60 y.o. female with history of undifferentiated connective tissue disease. She was started on PLQ after her last office visit but discontinued per recommendations of her ophthalmologist and cardiologist per patient.  [place]    Activities of Daily Living:  Patient reports morning stiffness for 5-10 minutes.   Patient Reports nocturnal pain.  Difficulty dressing/grooming: Denies Difficulty climbing stairs: Reports Difficulty getting out of chair: Reports Difficulty using hands for taps, buttons, cutlery, and/or writing: Reports  Review of Systems  Constitutional:  Positive for fatigue.  HENT:  Positive for mouth dryness. Negative for mouth sores and nose dryness.   Eyes:  Negative for pain, itching and dryness.  Respiratory:  Negative for shortness of breath and difficulty breathing.   Cardiovascular:  Negative for chest pain and palpitations.  Gastrointestinal:  Negative for blood in stool, constipation and diarrhea.  Endocrine: Negative for increased urination.  Genitourinary:  Negative for difficulty urinating.  Musculoskeletal:  Positive for joint pain, joint pain and morning stiffness. Negative for joint swelling, myalgias, muscle tenderness and myalgias.  Skin:  Negative for color change, rash and redness.  Allergic/Immunologic: Negative for susceptible to infections.  Neurological:  Positive for numbness. Negative for dizziness, headaches, memory loss and weakness.  Hematological:  Negative for bruising/bleeding tendency.  Psychiatric/Behavioral:  Negative for confusion.    PMFS History:  Patient Active Problem List   Diagnosis Date Noted    Nuclear sclerotic cataract of both eyes 05/27/2021   Diabetes mellitus without complication ([place]) 83/15/1761   Vitreomacular adhesion of both eyes 05/27/2021   Antiphospholipid antibody positive 05/21/2021   Pain in left elbow 02/19/2021   Chronic left-sided low back pain with left-sided sciatica 07/22/2017   Thyroid nodule 04/16/2017   Essential hypertension 03/13/2014   Menometrorrhagia    Dysmenorrhea    Anemia    Hyperlipidemia 05/23/2007   OBESITY, MORBID 05/23/2007   DYSFUNCTIONAL UTERINE BLEEDING - s/p LAVH 05/23/2007   ROTATOR CUFF SYNDROME 05/23/2007   KNEE PAIN, HX OF 05/23/2007    Past Medical History:  Diagnosis Date   Anemia    Anxiety    Arthritis    Diabetes mellitus without complication ([place])    recent dx diet controlled   DVT (deep venous thrombosis) ([facility])    remote - took coumadin   Dysmenorrhea    Dysrhythmia    irregular heart beat   Fibroid    GERD (gastroesophageal reflux disease)    hx of   Gout    Hypercholesteremia    Hypertension 4 months   Menometrorrhagia    Morbid obesity ([place])    Prediabetes    Substance abuse ([place])     Family History  Problem Relation Age of Onset   Diabetes Mother    Hypertension Mother    Cancer Mother    Deep vein thrombosis Mother        and PE   Diabetes Father    Hypertension Father    Cancer Father 76   Throat cancer Brother 109   Pulmonary embolism Daughter  Heart disease Other        No family history   Past Surgical History:  Procedure Laterality Date   ABDOMINAL HYSTERECTOMY  2008   BREAST BIOPSY Left 2013   benign   BUNIONECTOMY Bilateral    CHOLECYSTECTOMY     COLONOSCOPY WITH PROPOFOL N/A 10/27/2018   Procedure: COLONOSCOPY WITH PROPOFOL;  Surgeon: Wonda Horner, MD;  Location: [facility];  Service: Endoscopy;  Laterality: N/A;   ECTOPIC PREGNANCY SURGERY  1980's   MANDIBLE OSTEOTOMY Bilateral 07/03/2013   Procedure: BILATERAL TORI;  Surgeon: Gae Bon, DDS;  Location: [place];   Service: Oral Surgery;  Laterality: Bilateral;   NM MYOCAR PERF WALL MOTION  07/2012   lexiscan - normal pattern of perfusion, low risk   ROTATOR CUFF REPAIR Bilateral 2009   SLEEP STUDY  07/27/2012   AHI 4.8/hr   TOOTH EXTRACTION N/A 07/03/2013   Procedure: DENTAL EXTRACTION # 20;  Surgeon: Gae Bon, DDS;  Location: [place];  Service: Oral Surgery;  Laterality: N/A;   TRANSTHORACIC ECHOCARDIOGRAM  07/2012   EF=>55%, mod conc LVH; LA mod dilated; trace MR; mild TR with normal RVSP; trace AV regurg   TUBAL LIGATION     Social History   Social History Narrative   Not on file   Immunization History  Administered Date(s) Administered   PFIZER(Purple Top)SARS-COV-2 Vaccination 05/13/2020, 05/24/2020     Objective: Vital Signs: BP 102/69 (BP Location: Left Arm, Patient Position: Sitting, Cuff Size: Large)   Pulse 68   Ht '5\' 6"'  (1.676 m)   Wt (!) 308 lb 12.8 oz (140.1 kg)   BMI 49.84 kg/m    Physical Exam Vitals and nursing note reviewed.  Constitutional:      Appearance: She is well-developed.  HENT:     Head: Normocephalic and atraumatic.  Eyes:     Conjunctiva/sclera: Conjunctivae normal.  Cardiovascular:     Rate and Rhythm: Normal rate and regular rhythm.     Heart sounds: Normal heart sounds.  Pulmonary:     Effort: Pulmonary effort is normal.     Breath sounds: Normal breath sounds.  Abdominal:     General: Bowel sounds are normal.     Palpations: Abdomen is soft.  Musculoskeletal:     Cervical back: Normal range of motion.  Lymphadenopathy:     Cervical: No cervical adenopathy.  Skin:    General: Skin is warm and dry.     Capillary Refill: Capillary refill takes less than 2 seconds.  Neurological:     Mental Status: She is alert and oriented to person, place, and time.  Psychiatric:        Behavior: Behavior normal.     Musculoskeletal Exam: C-spine has good range of motion with no discomfort.  Shoulder joints have good range of motion with no  discomfort.  Elbow joints, wrist joints, MCPs, PIPs, DIPs have good range of motion with no synovitis.  She was able to make a complete fist bilaterally.  Knee joints have good range of motion with no warmth or effusion.  Ankle joints have good range of motion with no tenderness or joint swelling.  CDAI Exam: CDAI Score: -- Patient Global: --; Provider Global: -- Swollen: --; Tender: -- Joint Exam 07/01/2021   No joint exam has been documented for this visit   There is currently no information documented on the homunculus. Go to the Rheumatology activity and complete the homunculus joint exam.  Investigation: No additional findings.  Imaging:  No results found.  Recent Labs: Lab Results  Component Value Date   WBC 6.3 05/20/2021   HGB 12.4 05/20/2021   PLT 251 05/20/2021   NA 145 05/20/2021   K 4.1 05/20/2021   CL 105 05/20/2021   CO2 32 05/20/2021   GLUCOSE 91 05/20/2021   BUN 11 05/20/2021   CREATININE 1.09 (H) 05/20/2021   BILITOT 0.3 05/20/2021   ALKPHOS 60 05/20/2021   AST 32 05/20/2021   ALT 59 (H) 05/20/2021   PROT 7.8 05/20/2021   ALBUMIN 3.9 05/20/2021   CALCIUM 9.6 05/20/2021   GFRAA 66 04/18/2021    Speciality Comments: No specialty comments available.  Procedures:  No procedures performed Allergies: Buprenorphine hcl-naloxone hcl, Naloxone, and Dilaudid [hydromorphone hcl]   Assessment / Plan:     Visit Diagnoses: Undifferentiated connective tissue disease ([place]) - 09/23/20: AVISE lupus results: equivocal-index 0.9.  CB-CAP: Ed4d+, ANA 1:160 speckled, Anti-B2 glycoprotein equivocal, Anti-cardiolipin IgG weak positive. 04/18/21 was reviewed today in the office: ANA negative, dsDNA 14, ESR 19, complements WNL, anticardiolipin IgG 112, beta-2 glycoprotein IgG 103.  She does not meet clinical criteria for systemic lupus erythematosus.  She continues to have ongoing fatigue, myalgias, arthralgias, and morning stiffness but is not exhibiting any other symptoms of  autoimmune disease at this time.  No synovitis was noted on examination today.  At the patient's last office visit with [place] on 05/06/2021 she was started on Plaquenil 200 mg 1 tablet by mouth twice daily.   She was evaluated by Dr. Zadie Rhine on 05/27/2021, and his office visit note was reviewed today in the office: No signs of Plaquenil toxicity at this early stage were noted. The patient called our office on 06/02/2021 stating that she did not want to continue taking Plaquenil and stated that her cardiologist and eye doctor do not recommend use of Plaquenil.  There is mention of discontinuing the use of Plaquenil in Dr. Dahlia Bailiff OV note.  She has discontinued the use of Plaquenil and does not plan to restart. I placed a referral to hematology on 05/06/2021 due to history of DVT (2012-tx w/ warfarin x4yr as well as having positive antiphospholipid antibodies: Elevated beta-2 glycoprotein IgG at 1-3.5 anticardiolipin IgG >112.   Her initial consultation was on 05/20/2021 and the office visit note was reviewed today in the office.  The patient was sent in a prescription for Lovenox 1 mg/kg x 7 days to bridge with Coumadin.  She was advised to start Coumadin 5 mg once daily and was referred to the Coumadin clinic.  She was also referred to cardiology on 05/21/21. She has not started taking lovenox or warfarin and does not plan to.  Discussed that she is at high risk for future blood clots due to history of DVT as well as positive antiphospholipid antibodies.  She voiced understanding but does not want to initiate therapy at this time. Discussed the current recommendation to continue on Plaquenil and initiate warfarin as prescribed but she has declined both medications.  We discussed other treatment options for management of autoimmune disease but she does not want to start on any new medications at this time.  She is not a good candidate for Imuran due for medication reaction with allopurinol.  She does not want to take any  immunosuppressive agents. Discussed the patients case with Dr. DEstanislado Pandytoday.  She is in agreement that she should take PLQ and warfarin as discussed.   She was advised to notify [place] she develops any new  or worsening symptoms.  High risk medication use -Patient discontinued Plaquenil due to being apprehensive of potential side effects.  She does not plan to restart Plaquenil at this time.  Discussed other treatment options.  She is not a good candidate for Imuran due to the medication interaction with allopurinol.  She does not want to take any immunosuppressive agents at this time.  Anticardiolipin antibody positive: history of DVT (2012-tx w/ warfarin x48yr as well as having positive antiphospholipid antibodies: Elevated beta-2 glycoprotein IgG at 1-3.5 anticardiolipin IgG >112.  Referred to hematology on 05/06/2021.  She had initial consultation on 05/20/2021 at which time they plan to initiate Lovenox to bridge with Coumadin.  She was referred to the Coumadin clinic but has not initiated therapy.  She does not want to take Lovenox, warfarin, or Plaquenil at this time.  She has declined our current recommendations.  Discussed that she has had higher risk for future clots and she voiced understanding.  According to the patient she plans on remaining active and continuing on aspirin 81 mg daily.  History of DVT (deep vein thrombosis) - 2015, treated with warfarin for 2 years.  She is currently on aspirin 81 mg 1 tablet daily.  Patient reports that she is active and does not want to take warfarin as recommended by her hematologist or Dr. DEstanislado Pandyor myself.   Bilateral carpal tunnel syndrome: She continues to have intermittent paresthesias in both hands.  Chronic pain of both hips - She has chronic pain in both hips, right>left.  Followed by Dr. WDurward Fortes   Chronic pain of both knees - Follow-up with Dr. WDurward Fortes  She is good range of motion of both knee joints with no warmth or effusion.  DDD  (degenerative disc disease), lumbar - Followed by Dr. NErnestina Patches  She had radiofrequency ablation and epidural steroid injections in the past.   Chronic pain syndrome [facility]  Essential hypertension: Blood pressure was 102/69 today in the office.  Other medical conditions are listed as follows:  History of hyperlipidemia  Hepatic steatosis  Thyroid nodule  History of anemia  Female pattern hair loss  Orders: No orders of the defined types were placed in this encounter.  No orders of the defined types were placed in this encounter.     Follow-Up Instructions: Return if symptoms worsen or fail to improve.   TOfilia Neas PA-C  Note - This record has been created using Dragon software.  Chart creation errors have been sought, but may not always  have been located. Such creation errors do not reflect on  the standard of medical care.

## 2021-07-01 ENCOUNTER — Ambulatory Visit: Payer: Medicaid Other | Admitting: Physician Assistant

## 2021-07-01 ENCOUNTER — Encounter: Payer: Self-pay | Admitting: Physician Assistant

## 2021-07-01 ENCOUNTER — Other Ambulatory Visit: Payer: Self-pay

## 2021-07-01 VITALS — BP 102/69 | HR 68 | Ht 66.0 in | Wt 308.8 lb

## 2021-07-01 DIAGNOSIS — M51369 Other intervertebral disc degeneration, lumbar region without mention of lumbar back pain or lower extremity pain: Secondary | ICD-10-CM

## 2021-07-01 DIAGNOSIS — M25562 Pain in left knee: Secondary | ICD-10-CM

## 2021-07-01 DIAGNOSIS — R760 Raised antibody titer: Secondary | ICD-10-CM

## 2021-07-01 DIAGNOSIS — Z86718 Personal history of other venous thrombosis and embolism: Secondary | ICD-10-CM

## 2021-07-01 DIAGNOSIS — Z862 Personal history of diseases of the blood and blood-forming organs and certain disorders involving the immune mechanism: Secondary | ICD-10-CM

## 2021-07-01 DIAGNOSIS — L658 Other specified nonscarring hair loss: Secondary | ICD-10-CM

## 2021-07-01 DIAGNOSIS — I10 Essential (primary) hypertension: Secondary | ICD-10-CM

## 2021-07-01 DIAGNOSIS — K760 Fatty (change of) liver, not elsewhere classified: Secondary | ICD-10-CM

## 2021-07-01 DIAGNOSIS — Z79899 Other long term (current) drug therapy: Secondary | ICD-10-CM

## 2021-07-01 DIAGNOSIS — M359 Systemic involvement of connective tissue, unspecified: Secondary | ICD-10-CM | POA: Diagnosis not present

## 2021-07-01 DIAGNOSIS — G894 Chronic pain syndrome: Secondary | ICD-10-CM

## 2021-07-01 DIAGNOSIS — M25551 Pain in right hip: Secondary | ICD-10-CM

## 2021-07-01 DIAGNOSIS — M5136 Other intervertebral disc degeneration, lumbar region: Secondary | ICD-10-CM

## 2021-07-01 DIAGNOSIS — M25561 Pain in right knee: Secondary | ICD-10-CM

## 2021-07-01 DIAGNOSIS — M25552 Pain in left hip: Secondary | ICD-10-CM

## 2021-07-01 DIAGNOSIS — Z8639 Personal history of other endocrine, nutritional and metabolic disease: Secondary | ICD-10-CM

## 2021-07-01 DIAGNOSIS — E041 Nontoxic single thyroid nodule: Secondary | ICD-10-CM

## 2021-07-01 DIAGNOSIS — G5603 Carpal tunnel syndrome, bilateral upper limbs: Secondary | ICD-10-CM

## 2021-07-01 DIAGNOSIS — G8929 Other chronic pain: Secondary | ICD-10-CM

## 2021-08-02 ENCOUNTER — Other Ambulatory Visit: Payer: Self-pay | Admitting: Physician Assistant

## 2021-08-02 DIAGNOSIS — M359 Systemic involvement of connective tissue, unspecified: Secondary | ICD-10-CM

## 2021-08-12 ENCOUNTER — Telehealth: Payer: Self-pay | Admitting: Physician Assistant

## 2021-08-12 NOTE — Telephone Encounter (Signed)
Rescheduled 11/02 appointment to 11/11 due to provider pal, patient has been called and voicemail was left.

## 2021-08-13 ENCOUNTER — Ambulatory Visit: Payer: Medicaid Other | Admitting: Physician Assistant

## 2021-08-13 ENCOUNTER — Other Ambulatory Visit: Payer: Medicaid Other

## 2021-08-22 ENCOUNTER — Inpatient Hospital Stay: Payer: Medicaid Other

## 2021-08-22 ENCOUNTER — Inpatient Hospital Stay: Payer: Medicaid Other | Admitting: Physician Assistant

## 2021-08-30 ENCOUNTER — Other Ambulatory Visit: Payer: Self-pay | Admitting: Physician Assistant

## 2021-08-30 DIAGNOSIS — M359 Systemic involvement of connective tissue, unspecified: Secondary | ICD-10-CM

## 2021-09-01 NOTE — Telephone Encounter (Signed)
Per office note on 07/01/2021: Patient discontinued Plaquenil due to being apprehensive of potential side effects.  She does not plan to restart Plaquenil at this time.

## 2021-09-03 ENCOUNTER — Encounter: Payer: Self-pay | Admitting: Podiatry

## 2021-09-03 ENCOUNTER — Other Ambulatory Visit: Payer: Self-pay

## 2021-09-03 ENCOUNTER — Ambulatory Visit: Payer: Medicaid Other | Admitting: Podiatry

## 2021-09-03 DIAGNOSIS — M722 Plantar fascial fibromatosis: Secondary | ICD-10-CM

## 2021-09-03 DIAGNOSIS — M2140 Flat foot [pes planus] (acquired), unspecified foot: Secondary | ICD-10-CM | POA: Insufficient documentation

## 2021-09-03 DIAGNOSIS — M2141 Flat foot [pes planus] (acquired), right foot: Secondary | ICD-10-CM

## 2021-09-03 DIAGNOSIS — M2142 Flat foot [pes planus] (acquired), left foot: Secondary | ICD-10-CM | POA: Diagnosis not present

## 2021-09-03 NOTE — Progress Notes (Signed)
This patient presents to the office for diabetic evaluation.  Patient denies any pain or discomfort to the feet.  No drainage noted.  Vascular  Dorsalis pedis are palpable  B/L. Posterior tibial pulses are weakly palpable. Capillary return  WNL.  Temperature gradient is  WNL.  Skin turgor  WNL  Sensorium  Senn Weinstein monofilament wire  WNL. Normal tactile sensation.  Nail Exam  Patient has normal nails with no evidence of bacterial or fungal infection.  Orthopedic  Exam  Muscle tone and muscle strength  WNL.  No limitations of motion feet  B/L.  No crepitus or joint effusion noted.  Hammer toe second left foot. Bony prominences are unremarkable. Patient has numbness and tingling through her feet  B/L.  Skin  No open lesions.  Normal skin texture and turgor.     Diabetes with no complication   Plantar fascitis  B/L  IE.  Diabetic foot exam performed.  Discussed better shoes and arch supports for her plantar fasciitis.  RTC 1 year.  Gardiner Barefoot DPM

## 2021-09-05 ENCOUNTER — Inpatient Hospital Stay (HOSPITAL_BASED_OUTPATIENT_CLINIC_OR_DEPARTMENT_OTHER): Payer: Medicaid Other | Admitting: Physician Assistant

## 2021-09-05 ENCOUNTER — Other Ambulatory Visit: Payer: Self-pay

## 2021-09-05 ENCOUNTER — Inpatient Hospital Stay: Payer: Medicaid Other | Attending: Physician Assistant

## 2021-09-05 ENCOUNTER — Telehealth: Payer: Self-pay | Admitting: Physician Assistant

## 2021-09-05 VITALS — BP 116/75 | HR 63 | Temp 97.1°F | Resp 17 | Wt 318.1 lb

## 2021-09-05 DIAGNOSIS — Z7982 Long term (current) use of aspirin: Secondary | ICD-10-CM | POA: Diagnosis not present

## 2021-09-05 DIAGNOSIS — R768 Other specified abnormal immunological findings in serum: Secondary | ICD-10-CM | POA: Insufficient documentation

## 2021-09-05 DIAGNOSIS — R760 Raised antibody titer: Secondary | ICD-10-CM

## 2021-09-05 DIAGNOSIS — Z87891 Personal history of nicotine dependence: Secondary | ICD-10-CM | POA: Diagnosis not present

## 2021-09-05 DIAGNOSIS — Z86718 Personal history of other venous thrombosis and embolism: Secondary | ICD-10-CM | POA: Insufficient documentation

## 2021-09-05 DIAGNOSIS — Z79899 Other long term (current) drug therapy: Secondary | ICD-10-CM | POA: Insufficient documentation

## 2021-09-05 DIAGNOSIS — Z7901 Long term (current) use of anticoagulants: Secondary | ICD-10-CM | POA: Diagnosis not present

## 2021-09-05 LAB — CBC WITH DIFFERENTIAL (CANCER CENTER ONLY)
Abs Immature Granulocytes: 0.01 10*3/uL (ref 0.00–0.07)
Basophils Absolute: 0 10*3/uL (ref 0.0–0.1)
Basophils Relative: 0 %
Eosinophils Absolute: 0.2 10*3/uL (ref 0.0–0.5)
Eosinophils Relative: 4 %
HCT: 34.9 % — ABNORMAL LOW (ref 36.0–46.0)
Hemoglobin: 11.5 g/dL — ABNORMAL LOW (ref 12.0–15.0)
Immature Granulocytes: 0 %
Lymphocytes Relative: 44 %
Lymphs Abs: 2.2 10*3/uL (ref 0.7–4.0)
MCH: 29.2 pg (ref 26.0–34.0)
MCHC: 33 g/dL (ref 30.0–36.0)
MCV: 88.6 fL (ref 80.0–100.0)
Monocytes Absolute: 0.5 10*3/uL (ref 0.1–1.0)
Monocytes Relative: 11 %
Neutro Abs: 2 10*3/uL (ref 1.7–7.7)
Neutrophils Relative %: 41 %
Platelet Count: 261 10*3/uL (ref 150–400)
RBC: 3.94 MIL/uL (ref 3.87–5.11)
RDW: 13.4 % (ref 11.5–15.5)
WBC Count: 4.9 10*3/uL (ref 4.0–10.5)
nRBC: 0 % (ref 0.0–0.2)

## 2021-09-05 LAB — CMP (CANCER CENTER ONLY)
ALT: 26 U/L (ref 0–44)
AST: 22 U/L (ref 15–41)
Albumin: 3.4 g/dL — ABNORMAL LOW (ref 3.5–5.0)
Alkaline Phosphatase: 53 U/L (ref 38–126)
Anion gap: 8 (ref 5–15)
BUN: 8 mg/dL (ref 8–23)
CO2: 30 mmol/L (ref 22–32)
Calcium: 8.9 mg/dL (ref 8.9–10.3)
Chloride: 107 mmol/L (ref 98–111)
Creatinine: 0.86 mg/dL (ref 0.44–1.00)
GFR, Estimated: >60 mL/min (ref >60)
Glucose, Bld: 117 mg/dL — ABNORMAL HIGH (ref 70–99)
Potassium: 4 mmol/L (ref 3.5–5.1)
Sodium: 145 mmol/L (ref 135–145)
Total Bilirubin: 0.3 mg/dL (ref 0.3–1.2)
Total Protein: 7 g/dL (ref 6.5–8.1)

## 2021-09-05 NOTE — Progress Notes (Signed)
[place] [phone]) [phone]   [phone]  PROGRESS NOTE  Patient Care Team: Riki Sheer, NP as PCP - General (Nurse Practitioner) Terance Ice, MD (Inactive) as Referring Physician (Cardiology)  Hematological/Oncological History 1) 11/29/2010: Left Leg Doppler US after patient presented with pain and swelling in left lower extremity. Findings revealed non occlusive acute DVT involving the posterior tibial and popliteal veins of the left lower extremity. Treated with lovenox bridge and then transitioned to coumadin. Patient reports taking coumadin for two years.   2) 04/18/2021: Anticardiolipin IgG >112.0, Beta-2 Glycoprotein IgG 103.5  3) 05/20/2021: Establish care with Dede Query PA-C  CHIEF COMPLAINTS/PURPOSE OF CONSULTATION:  "Elevated anticardiolipin and beta-2 glycoprotein antibodies "  HISTORY OF PRESENTING ILLNESS:  [name] 60 y.o. female returns for follow-up for elevated antiphospholipid antibodies. She is unaccompanied for this visit. She was last seen on 05/20/2021 without any changes to her health in the interim.   On exam today, [name] reports that her energy levels are fairly stable.  She is less active due to diffuse joint pain but continues to complete all her ADLs on her own.  She has a good appetite and notes that she has gained approximately 10 pounds in the last 2 months.  She denies any nausea, vomiting or abdominal pain.  Her bowel habits are fairly stable without any diarrhea or constipation.  She denies easy bruising or signs of bleeding.  She denies any fevers, chills, night sweats, shortness of breath, chest pain, peripheral edema, neuropathy or other skin changes.  She has no other complaints. Rest of the 10 point ROS is below.  MEDICAL HISTORY:  Past Medical History:  Diagnosis Date   Anemia    Anxiety    Arthritis    Diabetes mellitus without complication ([place])    recent dx diet controlled   DVT  (deep venous thrombosis) ([facility])    remote - took coumadin   Dysmenorrhea    Dysrhythmia    irregular heart beat   Fibroid    GERD (gastroesophageal reflux disease)    hx of   Gout    Hypercholesteremia    Hypertension 4 months   Menometrorrhagia    Morbid obesity ([place])    Prediabetes    Substance abuse ([place])     SURGICAL HISTORY: Past Surgical History:  Procedure Laterality Date   ABDOMINAL HYSTERECTOMY  2008   BREAST BIOPSY Left 2013   benign   BUNIONECTOMY Bilateral    CHOLECYSTECTOMY     COLONOSCOPY WITH PROPOFOL N/A 10/27/2018   Procedure: COLONOSCOPY WITH PROPOFOL;  Surgeon: Wonda Horner, MD;  Location: [facility];  Service: Endoscopy;  Laterality: N/A;   ECTOPIC PREGNANCY SURGERY  1980's   MANDIBLE OSTEOTOMY Bilateral 07/03/2013   Procedure: BILATERAL TORI;  Surgeon: Gae Bon, DDS;  Location: [facility];  Service: Oral Surgery;  Laterality: Bilateral;   NM MYOCAR PERF WALL MOTION  07/2012   lexiscan - normal pattern of perfusion, low risk   ROTATOR CUFF REPAIR Bilateral 2009   SLEEP STUDY  07/27/2012   AHI 4.8/hr   TOOTH EXTRACTION N/A 07/03/2013   Procedure: DENTAL EXTRACTION # 20;  Surgeon: Gae Bon, DDS;  Location: [facility];  Service: Oral Surgery;  Laterality: N/A;   TRANSTHORACIC ECHOCARDIOGRAM  07/2012   EF=>55%, mod conc LVH; LA mod dilated; trace MR; mild TR with normal RVSP; trace AV regurg   TUBAL LIGATION      SOCIAL HISTORY: Social History  Socioeconomic History   Marital status: Single    Spouse name: Not on file   Number of children: 6   Years of education: 66   Highest education level: Not on file  Occupational History    Employer: UNEMPLOYED    Comment: Disability  Tobacco Use   Smoking status: Former    Packs/day: 0.10    Years: 6.00    Pack years: 0.60    Types: Cigarettes    Quit date: 02/14/2011    Years since quitting: 10.5   Smokeless tobacco: Never  Vaping Use   Vaping Use: Never used  Substance and Sexual Activity    Alcohol use: No    Comment: Previous ETOH abuse   Drug use: No    Comment: Previous crack use    Sexual activity: Never    Birth control/protection: Abstinence  Other Topics Concern   Not on file  Social History Narrative   Not on file   Social Determinants of Health   Financial Resource Strain: Not on file  Food Insecurity: Not on file  Transportation Needs: Not on file  Physical Activity: Not on file  Stress: Not on file  Social Connections: Not on file  Intimate Partner Violence: Not on file    FAMILY HISTORY: Family History  Problem Relation Age of Onset   Diabetes Mother    Hypertension Mother    Cancer Mother    Deep vein thrombosis Mother        and PE   Diabetes Father    Hypertension Father    Cancer Father 98   Throat cancer Brother 11   Pulmonary embolism Daughter    Heart disease Other        No family history    ALLERGIES:  is allergic to buprenorphine hcl-naloxone hcl, naloxone, and dilaudid [hydromorphone hcl].  MEDICATIONS:  Current Outpatient Medications  Medication Sig Dispense Refill   allopurinol (ZYLOPRIM) 100 MG tablet Take 100 mg by mouth daily.     aspirin 81 MG chewable tablet Chew 81 mg by mouth daily.     atorvastatin (LIPITOR) 40 MG tablet Take 40 mg by mouth daily.     Blood Glucose Monitoring Suppl (ACCU-CHEK GUIDE ME) w/Device KIT      buPROPion (WELLBUTRIN XL) 300 MG 24 hr tablet Take 300 mg by mouth at bedtime.     cholecalciferol (VITAMIN D3) 25 MCG (1000 UT) tablet Take 2,000 Units by mouth daily.     [employer] MAGNESIUM OXIDE 250 MG TABS Take 1 tablet by mouth daily.     cyclobenzaprine (FLEXERIL) 10 MG tablet Take 10 mg by mouth 2 (two) times daily as needed for muscle spasms.      cyclobenzaprine (FLEXERIL) 5 MG tablet Take 5 mg by mouth 2 (two) times daily.     diclofenac sodium (VOLTAREN) 1 % GEL Apply 4 g topically 4 (four) times daily. (Patient not taking: Reported on 07/01/2021) 100 g 0   Diethylpropion HCl CR 75 MG TB24 Take 1  tablet by mouth daily.     enoxaparin (LOVENOX) 150 MG/ML injection Inject 0.9 mLs (135 mg total) into the skin every 12 (twelve) hours for 7 days. (Patient not taking: Reported on 07/01/2021) 14 mL 0   furosemide (LASIX) 20 MG tablet Take 20 mg by mouth daily.     gabapentin (NEURONTIN) 300 MG capsule Take 300 mg by mouth 3 (three) times daily. (Patient not taking: Reported on 07/01/2021)     gabapentin (NEURONTIN) 400 MG capsule  Take 400 mg by mouth 3 (three) times daily.     glucose blood test strip TEST GLUCOSE TWICE A DAY     hydroxychloroquine (PLAQUENIL) 200 MG tablet Take 1 tablet (200 mg total) by mouth 2 (two) times daily. 180 tablet 0   lidocaine (XYLOCAINE) 5 % ointment Apply 1 application topically as needed for moderate pain.     loratadine (CLARITIN) 10 MG tablet Take 10 mg by mouth daily.     MELATONIN PO Take by mouth at bedtime as needed.     meloxicam (MOBIC) 15 MG tablet Take 15 mg by mouth daily.      metoprolol succinate (TOPROL-XL) 50 MG 24 hr tablet Take 50 mg by mouth daily. Take with or immediately following a meal.     morphine (MS CONTIN) 15 MG 12 hr tablet Take 15 mg by mouth 2 (two) times daily.     Morphine Sulfate ER 30 MG T12A Take 30 mg by mouth every 12 (twelve) hours. (Patient not taking: Reported on 07/01/2021)     naloxegol oxalate (MOVANTIK) 25 MG TABS tablet Take 25 mg by mouth daily.      NARCAN 4 MG/0.1ML LIQD nasal spray kit SMARTSIG:1 Spray(s) Both Nares Once PRN     oxyCODONE-acetaminophen (PERCOCET) 10-325 MG tablet Take 1 tablet by mouth 4 (four) times daily.      OZEMPIC, 0.25 OR 0.5 MG/DOSE, 2 MG/1.5ML SOPN Inject into the skin.     polyethylene glycol (MIRALAX / GLYCOLAX) packet Take 17 g by mouth daily as needed for moderate constipation.     potassium chloride SA (K-DUR,KLOR-CON) 20 MEQ tablet Take 20 mEq by mouth daily.     ROGAINE MENS EXTRA STRENGTH 5 % FOAM Apply 1 application topically daily. (Patient not taking: Reported on 07/01/2021)      Vitamin D, Ergocalciferol, (DRISDOL) 1.25 MG (50000 UNIT) CAPS capsule Take 50,000 Units by mouth once a week.     warfarin (COUMADIN) 2.5 MG tablet Take 1 tablet (2.5 mg total) by mouth daily. (Patient not taking: Reported on 07/01/2021) 30 tablet 3   No current facility-administered medications for this visit.    REVIEW OF SYSTEMS:   Constitutional: ( - ) fevers, ( - )  chills , ( - ) night sweats Eyes: ( - ) blurriness of vision, ( - ) double vision, ( - ) watery eyes Ears, nose, mouth, throat, and face: ( - ) mucositis, ( - ) sore throat Respiratory: ( - ) cough, ( - ) dyspnea, ( - ) wheezes Cardiovascular: ( - ) palpitation, ( - ) chest discomfort, ( - ) lower extremity swelling Gastrointestinal:  ( - ) nausea, ( - ) heartburn, ( - ) change in bowel habits Skin: ( - ) abnormal skin rashes Lymphatics: ( - ) new lymphadenopathy, ( - ) easy bruising Neurological: ( - ) numbness, ( - ) tingling, ( - ) new weaknesses Behavioral/Psych: ( - ) mood change, ( - ) new changes  All other systems were reviewed with the patient and are negative.  PHYSICAL EXAMINATION: ECOG PERFORMANCE STATUS: 1 - Symptomatic but completely ambulatory  Vitals:   09/05/21 0838  BP: 116/75  Pulse: 63  Resp: 17  Temp: (!) 97.1 F (36.2 C)  SpO2: 95%   Filed Weights   09/05/21 0838  Weight: (!) 318 lb 1.6 oz (144.3 kg)    GENERAL: well appearing female in NAD  SKIN: skin color, texture, turgor are normal, no rashes or significant lesions EYES:  conjunctiva are pink and non-injected, sclera clear OROPHARYNX: no exudate, no erythema; lips, buccal mucosa, and tongue normal  LUNGS: clear to auscultation and percussion with normal breathing effort HEART: regular rate & rhythm and no murmurs and no lower extremity edema ABDOMEN: soft, non-tender, non-distended, normal bowel sounds Musculoskeletal: no cyanosis of digits and no clubbing  PSYCH: alert & oriented x 3, fluent speech NEURO: no focal motor/sensory  deficits  LABORATORY DATA:  I have reviewed the data as listed CBC Latest Ref Rng & Units 09/05/2021 05/20/2021 04/18/2021  WBC 4.0 - 10.5 K/uL 4.9 6.3 5.5  Hemoglobin 12.0 - 15.0 g/dL 11.5(L) 12.4 12.1  Hematocrit 36.0 - 46.0 % 34.9(L) 36.0 37.9  Platelets 150 - 400 K/uL 261 251 245    CMP Latest Ref Rng & Units 09/05/2021 05/20/2021 04/18/2021  Glucose 70 - 99 mg/dL 117(H) 91 104(H)  BUN 8 - 23 mg/dL '8 11 10  ' Creatinine 0.44 - 1.00 mg/dL 0.86 1.09(H) 1.06(H)  Sodium 135 - 145 mmol/L 145 145 143  Potassium 3.5 - 5.1 mmol/L 4.0 4.1 4.4  Chloride 98 - 111 mmol/L 107 105 105  CO2 22 - 32 mmol/L 30 32 32  Calcium 8.9 - 10.3 mg/dL 8.9 9.6 9.3  Total Protein 6.5 - 8.1 g/dL 7.0 7.8 7.0  Total Bilirubin 0.3 - 1.2 mg/dL 0.3 0.3 0.4  Alkaline Phos 38 - 126 U/L 53 60 -  AST 15 - 41 U/L 22 32 22  ALT 0 - 44 U/L 26 59(H) 31(H)   ASSESSMENT & PLAN [name] is a 60 y.o. female returns for a follow up for elevated Anticardiolipin and Beta-2 Glycoprotein antibodies.   #Positive antiphospholipid antibodies: -Labs from 04/18/2021 showed elevated beta-2 glycoprotein IgG at 103.5 and anticardiolipin IgG >112.0.  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 -Recommended to start anticoagulation with warfarin at last visit due to elevated antibody levels but patient declined and is currently on ASA 81 mg BID.  --Labs today included CBC and CMP, both were reviewed requiring no intervention at this time. Lupus anticoagulant, cardiolipin antibodies and beta-2-glycoprotein antibodies are pending.  --If antibody levels are elevated and she meets criteria for APS, we recommend initiating warfarin 5 mg with Lovenox bridge.  --RTC in 3 months with labs  No orders of the defined types were placed in this encounter.   All questions were answered. The patient knows to call the clinic with any problems, questions or concerns.  I have spent a total of 25 minutes minutes of face-to-face and  non-face-to-face time, preparing to see the patient, performing a medically appropriate examination, counseling and educating the patient, documenting clinical information in the electronic health record, and care coordination.   Dede Query, PA-C Department of Hematology/Oncology [facility] Phone: [phone]

## 2021-09-05 NOTE — Telephone Encounter (Signed)
Schedule 11/25 los, pt has been called and confirmed appt

## 2021-09-07 LAB — LUPUS ANTICOAGULANT PANEL
DRVVT: 38 s (ref 0.0–47.0)
PTT Lupus Anticoagulant: 37 s (ref 0.0–51.9)

## 2021-09-07 LAB — BETA-2-GLYCOPROTEIN I ABS, IGG/M/A
Beta-2 Glyco I IgG: 33 GPI IgG units — ABNORMAL HIGH (ref 0–20)
Beta-2-Glycoprotein I IgA: <9 GPI IgA units (ref 0–25)
Beta-2-Glycoprotein I IgM: <9 GPI IgM units (ref 0–32)

## 2021-09-09 LAB — CARDIOLIPIN ANTIBODIES, IGG, IGM, IGA
Anticardiolipin IgA: 15 APL U/mL — ABNORMAL HIGH (ref 0–11)
Anticardiolipin IgG: 45 GPL U/mL — ABNORMAL HIGH (ref 0–14)
Anticardiolipin IgM: 17 MPL U/mL — ABNORMAL HIGH (ref 0–12)

## 2021-09-12 ENCOUNTER — Telehealth: Payer: Self-pay | Admitting: Physician Assistant

## 2021-09-12 NOTE — Telephone Encounter (Signed)
I called Ms. [name] to review the lab results from 09/05/2021.  Although anticardiolipin antibodies and beta-2 glycoprotein antibodies improved from 4 months ago, levels are still elevated and meet criteria for antiphospholipid syndrome.  With patient's history of DVT, we recommend anticoagulation with Coumadin.  Patient has declined our recommendation and would like to continue on aspirin 81 mg once daily.  Patient acknowledges that she is at higher risk of developing a VTE.  She will follow-up with [place] if she changes her mind.  She is scheduled to see [place] back in 6 months for a follow-up visit.  Patient expressed understanding and satisfaction with the plan provided.

## 2021-09-22 ENCOUNTER — Other Ambulatory Visit: Payer: Self-pay | Admitting: Physician Assistant

## 2021-09-22 DIAGNOSIS — M359 Systemic involvement of connective tissue, unspecified: Secondary | ICD-10-CM

## 2021-09-22 NOTE — Telephone Encounter (Signed)
Patient discontinued Plaquenil due to being apprehensive of potential side effects.  She does not plan to restart Plaquenil at this time

## 2021-10-13 ENCOUNTER — Other Ambulatory Visit: Payer: Self-pay | Admitting: Physician Assistant

## 2021-10-13 DIAGNOSIS — M359 Systemic involvement of connective tissue, unspecified: Secondary | ICD-10-CM

## 2021-11-26 ENCOUNTER — Telehealth: Payer: Self-pay | Admitting: Physical Medicine and Rehabilitation

## 2021-11-26 NOTE — Telephone Encounter (Signed)
Patient called. She would like an appointment with Dr. Ernestina Patches. Her call back number is [phone]

## 2021-12-02 ENCOUNTER — Encounter: Payer: Self-pay | Admitting: Physical Medicine and Rehabilitation

## 2021-12-02 ENCOUNTER — Other Ambulatory Visit: Payer: Self-pay

## 2021-12-02 ENCOUNTER — Ambulatory Visit: Payer: Medicaid Other | Admitting: Physical Medicine and Rehabilitation

## 2021-12-02 VITALS — BP 134/80 | HR 88

## 2021-12-02 DIAGNOSIS — R1032 Left lower quadrant pain: Secondary | ICD-10-CM | POA: Diagnosis not present

## 2021-12-02 DIAGNOSIS — M47816 Spondylosis without myelopathy or radiculopathy, lumbar region: Secondary | ICD-10-CM

## 2021-12-02 DIAGNOSIS — G8929 Other chronic pain: Secondary | ICD-10-CM

## 2021-12-02 DIAGNOSIS — M545 Low back pain, unspecified: Secondary | ICD-10-CM | POA: Diagnosis not present

## 2021-12-02 MED ORDER — DIAZEPAM 5 MG PO TABS: ORAL_TABLET | ORAL | 0 refills | Status: DC

## 2021-12-02 NOTE — Progress Notes (Signed)
[name] [place] - 61 y.o. female MRN [id number]  Date of birth: 1961/06/10  Office Visit Note: Visit Date: 12/02/2021 PCP: Riki Sheer, NP Referred by: Riki Sheer, NP  Subjective: Chief Complaint  Patient presents with   Lower Back - Pain   Left Hip - Pain   HPI: [name] is a 61 y.o. female who comes in today Chronic, worsening and severe bilateral lower back pain radiating to left groin. Patient reports pain has been chronic for several years. Patient reports most severe pain is left groin region. Patient reports pain is exacerbated by movement, activity, putting on pants and getting into her car. Patient describes pain as constant and sharp in nature, currently rates pain as 8 out of 10. Patient reports some relief of pain with use of TENS unit and medications. Patient currently being treated for chronic pain at [facility], taking Percocet 10mg  180 tablets per month prescribed by Andy Gauss, FNP. Patient is also currently attending formal physical therapy at [facility] and reports some pain relief with these treatments. Patients bilateral hip xray images from 2022 show mild degenerative changes to both hips. Patients lumbar MRI from 2018 exhibits moderate facet arthropathy at L3-L4 and moderate to severe facet arthropathy at L4-L5. No high grade spinal canal stenosis noted. Patient does have history of bilateral radiofrequency ablation many years ago by Dr. Magnus Sinning and reports some relief of pain with this procedure. Patient did have left L5-S1 interlaminar epidural steroid injection in 2020 and reports good pain relief, however we have not seen her in the office since this procedure. Patient is currently using cane to assist with ambulation and prevent falls. Patient denies focal weakness, numbness and tingling.  She is typically followed by Dr. Joni Fears from an orthopedic  standpoint.     Review of Systems  Musculoskeletal:  Positive for back pain and joint pain.  Neurological:  Negative for tingling, sensory change, focal weakness and weakness.  All other systems reviewed and are negative. Otherwise per HPI.  Assessment & Plan: Visit Diagnoses:    ICD-10-CM   1. Chronic bilateral low back pain without sciatica  M54.50 MR LUMBAR SPINE WO CONTRAST   G89.29     2. Facet hypertrophy of lumbar region  M47.816 MR LUMBAR SPINE WO CONTRAST    3. Groin pain, chronic, left  R10.32 Ambulatory referral to Physical Medicine Rehab   G89.29     4. OBESITY, MORBID  E66.01        Plan: Findings:  Chronic, worsening and severe bilateral lower back pain radiating to left groin region. Left groin pain continues to be the most severe for her, she continues to have excruciating and debilitating pain despite good conservative therapies such as formal physical therapy, use of TENS unit and medications. Patients clinical presentation is consistent with both left hip intra-articular pathology and facet mediated pain. We believe the next step is to perform a diagnostic and hopefully therapeutic left intra-articular hip injection under fluoroscopic guidance. We also placed an order for new lumbar MRI imaging today. Patient did voice concerns about MRI and requested an open machine and medication to help her relax. I did place prescription today for pre-procedure Valium. If patient continues to have lower back pain we would consider repeating radiofrequency ablation if warranted. Patient encouraged to continue with formal physical therapy as tolerated. Patient instructed to use cane to assist with ambulation and prevent falls. No red flag symptoms noted  upon exam today.    Meds & Orders:  Meds ordered this encounter  Medications   diazepam (VALIUM) 5 MG tablet    Sig: Take one tablet by mouth with food one hour prior to procedure. May repeat 30 minutes prior if needed.     Dispense:  2 tablet    Refill:  0    Order Specific Question:   Supervising Provider    Answer:   Magnus Sinning [[id number]]    Orders Placed This Encounter  Procedures   MR LUMBAR SPINE WO CONTRAST   Ambulatory referral to Physical Medicine Rehab    Follow-up: Return for Left intra-articular hip injection.   Procedures: No procedures performed      Clinical History: MRI LUMBAR SPINE WITHOUT CONTRAST   TECHNIQUE: Multiplanar, multisequence MR imaging of the lumbar spine was performed. No intravenous contrast was administered.   COMPARISON:  Lumbar spine radiograph July 22, 2017 and MRI of lumbar spine December 11, 2011   FINDINGS: SEGMENTATION: For the purposes of this report, the last well-formed intervertebral disc will be reported as L5-S1.   ALIGNMENT: Maintained lumbar lordosis. No malalignment.   VERTEBRAE:Vertebral bodies are intact. Intervertebral discs demonstrate normal morphology, mild desiccation lower lumbar discs. RIGHT L4-5 facet bone marrow edema. Mild chronic discogenic endplate changes W10-X3 and L1-2. No suspicious bone marrow signal.   CONUS MEDULLARIS: Conus medullaris terminates at L1-2 and demonstrates normal morphology and signal characteristics. Cauda equina is normal.   PARASPINAL AND SOFT TISSUES: Included prevertebral and paraspinal soft tissues are nonacute.   DISC LEVELS:   T12-L1: Small broad-based disc bulge without canal stenosis or neural foraminal narrowing.   L1-2: Small broad-based disc bulge asymmetric the RIGHT. No canal stenosis. Mild RIGHT neural foraminal narrowing.   L2-3: No disc bulge, canal stenosis nor neural foraminal narrowing. Mild to moderate facet arthropathy and ligamentum flavum redundancy.   L3-4: No disc bulge, canal stenosis nor neural foraminal narrowing. Moderate facet arthropathy and ligamentum flavum redundancy   L4-5: Annular bulging, RIGHT annular fissure. Moderate to severe facet arthropathy and  ligamentum flavum redundancy without canal stenosis. Mild widening of the LEFT facet with effusion. Mild bilateral neural foraminal narrowing.   L5-S1: Small broad-based disc bulge. Mild RIGHT, moderate to severe LEFT facet arthropathy without canal stenosis. Moderate LEFT neural foraminal narrowing.   IMPRESSION: 1. Progressed degenerative change of the lower lumbar spine with superimposed RIGHT L4-5 facet acute reactive changes. 2. No canal stenosis. L4-5 L5-S1 neural foraminal narrowing: Moderate on the LEFT at L5-S1.     Electronically Signed   By: Elon Alas M.D.   On: 07/28/2017 19:55   She reports that she quit smoking about 10 years ago. Her smoking use included cigarettes. She has a 0.60 pack-year smoking history. She has never used smokeless tobacco. No results for input(s): HGBA1C, LABURIC in the last 8760 hours.  Objective:  VS:  HT:     WT:    BMI:      BP:134/80   HR:88bpm   TEMP: ( )   RESP:  Physical Exam Vitals and nursing note reviewed.  HENT:     Head: Normocephalic and atraumatic.     Right Ear: External ear normal.     Left Ear: External ear normal.     Nose: Nose normal.     Mouth/Throat:     Mouth: Mucous membranes are moist.  Eyes:     Extraocular Movements: Extraocular movements intact.  Cardiovascular:     Rate and Rhythm:  Normal rate.     Pulses: Normal pulses.  Pulmonary:     Effort: Pulmonary effort is normal.  Abdominal:     General: Abdomen is flat. There is no distension.  Musculoskeletal:        General: Tenderness present.     Cervical back: Normal range of motion.     Comments: Pt is slow to rise from seated position to standing. Concordant low back pain with facet loading, lumbar spine extension and rotation. Strong distal strength without clonus, no pain upon palpation of greater trochanters. Pain noted to internal/external rotation of left hip. Sensation intact bilaterally. Ambulates with cane, gait unsteady.   Skin:     General: Skin is warm and dry.     Capillary Refill: Capillary refill takes less than 2 seconds.  Neurological:     Mental Status: She is alert and oriented to person, place, and time.     Gait: Gait abnormal.  Psychiatric:        Mood and Affect: Mood normal.        Behavior: Behavior normal.    Ortho Exam  Imaging: No results found.  Past Medical/Family/Surgical/Social History: Medications & Allergies reviewed per EMR, new medications updated. Patient Active Problem List   Diagnosis Date Noted   Plantar fasciitis of right foot 09/03/2021   Pes planus 09/03/2021   Nuclear sclerotic cataract of both eyes 05/27/2021   Diabetes mellitus without complication ([place]) 62/83/1517   Vitreomacular adhesion of both eyes 05/27/2021   Antiphospholipid antibody positive 05/21/2021   Pain in left elbow 02/19/2021   Chronic left-sided low back pain with left-sided sciatica 07/22/2017   Thyroid nodule 04/16/2017   Essential hypertension 03/13/2014   Menometrorrhagia    Dysmenorrhea    Anemia    Hyperlipidemia 05/23/2007   OBESITY, MORBID 05/23/2007   DYSFUNCTIONAL UTERINE BLEEDING - s/p LAVH 05/23/2007   ROTATOR CUFF SYNDROME 05/23/2007   KNEE PAIN, HX OF 05/23/2007   Past Medical History:  Diagnosis Date   Anemia    Anxiety    Arthritis    Diabetes mellitus without complication (HCC)    recent dx diet controlled   DVT (deep venous thrombosis) (HCC)    remote - took coumadin   Dysmenorrhea    Dysrhythmia    irregular heart beat   Fibroid    GERD (gastroesophageal reflux disease)    hx of   Gout    Hypercholesteremia    Hypertension 4 months   Menometrorrhagia    Morbid obesity ([place])    Prediabetes    Substance abuse ([place])    Family History  Problem Relation Age of Onset   Diabetes Mother    Hypertension Mother    Cancer Mother    Deep vein thrombosis Mother        and PE   Diabetes Father    Hypertension Father    Cancer Father 61   Throat cancer Brother 67    Pulmonary embolism Daughter    Heart disease Other        No family history   Past Surgical History:  Procedure Laterality Date   ABDOMINAL HYSTERECTOMY  2008   BREAST BIOPSY Left 2013   benign   BUNIONECTOMY Bilateral    CHOLECYSTECTOMY     COLONOSCOPY WITH PROPOFOL N/A 10/27/2018   Procedure: COLONOSCOPY WITH PROPOFOL;  Surgeon: Wonda Horner, MD;  Location: [facility];  Service: Endoscopy;  Laterality: N/A;   ECTOPIC PREGNANCY SURGERY  1980's   MANDIBLE OSTEOTOMY Bilateral  07/03/2013   Procedure: BILATERAL TORI;  Surgeon: Gae Bon, DDS;  Location: [facility];  Service: Oral Surgery;  Laterality: Bilateral;   NM MYOCAR PERF WALL MOTION  07/2012   lexiscan - normal pattern of perfusion, low risk   ROTATOR CUFF REPAIR Bilateral 2009   SLEEP STUDY  07/27/2012   AHI 4.8/hr   TOOTH EXTRACTION N/A 07/03/2013   Procedure: DENTAL EXTRACTION # 20;  Surgeon: Gae Bon, DDS;  Location: [facility];  Service: Oral Surgery;  Laterality: N/A;   TRANSTHORACIC ECHOCARDIOGRAM  07/2012   EF=>55%, mod conc LVH; LA mod dilated; trace MR; mild TR with normal RVSP; trace AV regurg   TUBAL LIGATION     Social History   Occupational History    Employer: UNEMPLOYED    Comment: Disability  Tobacco Use   Smoking status: Former    Packs/day: 0.10    Years: 6.00    Pack years: 0.60    Types: Cigarettes    Quit date: 02/14/2011    Years since quitting: 10.8   Smokeless tobacco: Never  Vaping Use   Vaping Use: Never used  Substance and Sexual Activity   Alcohol use: No    Comment: Previous ETOH abuse   Drug use: No    Comment: Previous crack use    Sexual activity: Never    Birth control/protection: Abstinence

## 2021-12-02 NOTE — Progress Notes (Signed)
Pt state lower back pain that travels to her left hip and groin. Pt state walking ans sitting makes the pain worse. Pt state she takes pain meds and uses heat to help ease her pain.  Numeric Pain Rating Scale and Functional Assessment Average Pain 8 Pain Right Now 6 My pain is intermittent, constant, sharp, dull, stabbing, and aching Pain is worse with: walking and sitting Pain improves with: rest, heat/ice, and medication   In the last MONTH (on 0-10 scale) has pain interfered with the following?  1. General activity like being  able to carry out your everyday physical activities such as walking, climbing stairs, carrying groceries, or moving a chair?  Rating(7)  2. Relation with others like being able to carry out your usual social activities and roles such as  activities at home, at work and in your community. Rating(8)  3. Enjoyment of life such that you have  been bothered by emotional problems such as feeling anxious, depressed or irritable?  Rating(9)

## 2021-12-07 ENCOUNTER — Other Ambulatory Visit: Payer: Self-pay | Admitting: Hematology and Oncology

## 2021-12-07 DIAGNOSIS — R760 Raised antibody titer: Secondary | ICD-10-CM

## 2021-12-08 ENCOUNTER — Inpatient Hospital Stay (HOSPITAL_BASED_OUTPATIENT_CLINIC_OR_DEPARTMENT_OTHER): Payer: Medicaid Other | Admitting: Hematology and Oncology

## 2021-12-08 ENCOUNTER — Inpatient Hospital Stay: Payer: Medicaid Other | Attending: Physician Assistant

## 2021-12-08 ENCOUNTER — Other Ambulatory Visit: Payer: Self-pay

## 2021-12-08 VITALS — BP 124/60 | HR 64 | Temp 97.5°F | Resp 16 | Wt 318.0 lb

## 2021-12-08 DIAGNOSIS — Z7982 Long term (current) use of aspirin: Secondary | ICD-10-CM | POA: Diagnosis not present

## 2021-12-08 DIAGNOSIS — D6861 Antiphospholipid syndrome: Secondary | ICD-10-CM | POA: Diagnosis not present

## 2021-12-08 DIAGNOSIS — Z7901 Long term (current) use of anticoagulants: Secondary | ICD-10-CM | POA: Insufficient documentation

## 2021-12-08 DIAGNOSIS — Z87891 Personal history of nicotine dependence: Secondary | ICD-10-CM | POA: Insufficient documentation

## 2021-12-08 DIAGNOSIS — R760 Raised antibody titer: Secondary | ICD-10-CM

## 2021-12-08 DIAGNOSIS — Z79899 Other long term (current) drug therapy: Secondary | ICD-10-CM | POA: Insufficient documentation

## 2021-12-08 DIAGNOSIS — Z86718 Personal history of other venous thrombosis and embolism: Secondary | ICD-10-CM | POA: Diagnosis present

## 2021-12-08 LAB — CBC WITH DIFFERENTIAL (CANCER CENTER ONLY)
Abs Immature Granulocytes: 0.01 10*3/uL (ref 0.00–0.07)
Basophils Absolute: 0 10*3/uL (ref 0.0–0.1)
Basophils Relative: 1 %
Eosinophils Absolute: 0.3 10*3/uL (ref 0.0–0.5)
Eosinophils Relative: 4 %
HCT: 36.6 % (ref 36.0–46.0)
Hemoglobin: 12.3 g/dL (ref 12.0–15.0)
Immature Granulocytes: 0 %
Lymphocytes Relative: 42 %
Lymphs Abs: 2.7 10*3/uL (ref 0.7–4.0)
MCH: 29.3 pg (ref 26.0–34.0)
MCHC: 33.6 g/dL (ref 30.0–36.0)
MCV: 87.1 fL (ref 80.0–100.0)
Monocytes Absolute: 0.6 10*3/uL (ref 0.1–1.0)
Monocytes Relative: 9 %
Neutro Abs: 2.9 10*3/uL (ref 1.7–7.7)
Neutrophils Relative %: 44 %
Platelet Count: 257 10*3/uL (ref 150–400)
RBC: 4.2 MIL/uL (ref 3.87–5.11)
RDW: 14.1 % (ref 11.5–15.5)
WBC Count: 6.5 10*3/uL (ref 4.0–10.5)
nRBC: 0 % (ref 0.0–0.2)

## 2021-12-08 LAB — CMP (CANCER CENTER ONLY)
ALT: 18 U/L (ref 0–44)
AST: 19 U/L (ref 15–41)
Albumin: 4.1 g/dL (ref 3.5–5.0)
Alkaline Phosphatase: 57 U/L (ref 38–126)
Anion gap: 5 (ref 5–15)
BUN: 8 mg/dL (ref 8–23)
CO2: 31 mmol/L (ref 22–32)
Calcium: 9.5 mg/dL (ref 8.9–10.3)
Chloride: 107 mmol/L (ref 98–111)
Creatinine: 0.91 mg/dL (ref 0.44–1.00)
GFR, Estimated: >60 mL/min (ref >60)
Glucose, Bld: 110 mg/dL — ABNORMAL HIGH (ref 70–99)
Potassium: 4 mmol/L (ref 3.5–5.1)
Sodium: 143 mmol/L (ref 135–145)
Total Bilirubin: 0.3 mg/dL (ref 0.3–1.2)
Total Protein: 7.8 g/dL (ref 6.5–8.1)

## 2021-12-08 NOTE — Progress Notes (Signed)
[place] [phone]   [phone]  PROGRESS NOTE  Patient Care Team: Riki Sheer, NP as PCP - General (Nurse Practitioner) Terance Ice, MD (Inactive) as Referring Physician (Cardiology)  Hematological/Oncological History  #Elevated anticardiolipin and beta-2 glycoprotein antibodies # Antiphospholipid Antibody Syndrome  1) 11/29/2010: Left Leg Doppler US after patient presented with pain and swelling in left lower extremity. Findings revealed non occlusive acute DVT involving the posterior tibial and popliteal veins of the left lower extremity. Treated with lovenox bridge and then transitioned to coumadin. Patient reports taking coumadin for two years.   2) 04/18/2021: Anticardiolipin IgG >112.0, Beta-2 Glycoprotein IgG 103.5  3) 05/20/2021: Establish care with Dede Query PA-C   HISTORY OF PRESENTING ILLNESS:  [name] 61 y.o. female returns for follow-up for elevated antiphospholipid antibodies. She is unaccompanied for this visit. She was last seen on 09/05/2021 without any changes to her health in the interim.   On exam today, [name] reports she has been well in the interim since her last visit.  She is tolerating the baby aspirin twice daily well without any issues.  She notes he is not currently having any signs or symptoms concerning for recurrent VTE.  Overall she is at her baseline level of health.  She denies easy bruising or signs of bleeding.  She denies any fevers, chills, night sweats, shortness of breath, chest pain, peripheral edema, neuropathy or other skin changes.  She has no other complaints. Rest of the 10 point ROS is below.  MEDICAL HISTORY:  Past Medical History:  Diagnosis Date   Anemia    Anxiety    Arthritis    Diabetes mellitus without complication ([place])    recent dx diet controlled   DVT (deep venous thrombosis) ([facility])    remote - took coumadin   Dysmenorrhea    Dysrhythmia     irregular heart beat   Fibroid    GERD (gastroesophageal reflux disease)    hx of   Gout    Hypercholesteremia    Hypertension 4 months   Menometrorrhagia    Morbid obesity ([place])    Prediabetes    Substance abuse ([place])     SURGICAL HISTORY: Past Surgical History:  Procedure Laterality Date   ABDOMINAL HYSTERECTOMY  2008   BREAST BIOPSY Left 2013   benign   BUNIONECTOMY Bilateral    CHOLECYSTECTOMY     COLONOSCOPY WITH PROPOFOL N/A 10/27/2018   Procedure: COLONOSCOPY WITH PROPOFOL;  Surgeon: Wonda Horner, MD;  Location: [facility];  Service: Endoscopy;  Laterality: N/A;   ECTOPIC PREGNANCY SURGERY  1980's   MANDIBLE OSTEOTOMY Bilateral 07/03/2013   Procedure: BILATERAL TORI;  Surgeon: Gae Bon, DDS;  Location: [facility];  Service: Oral Surgery;  Laterality: Bilateral;   NM MYOCAR PERF WALL MOTION  07/2012   lexiscan - normal pattern of perfusion, low risk   ROTATOR CUFF REPAIR Bilateral 2009   SLEEP STUDY  07/27/2012   AHI 4.8/hr   TOOTH EXTRACTION N/A 07/03/2013   Procedure: DENTAL EXTRACTION # 20;  Surgeon: Gae Bon, DDS;  Location: [facility];  Service: Oral Surgery;  Laterality: N/A;   TRANSTHORACIC ECHOCARDIOGRAM  07/2012   EF=>55%, mod conc LVH; LA mod dilated; trace MR; mild TR with normal RVSP; trace AV regurg   TUBAL LIGATION      SOCIAL HISTORY: Social History   Socioeconomic History   Marital status: Single    Spouse name: Not on file   Number  of children: 6   Years of education: 11   Highest education level: Not on file  Occupational History    Employer: UNEMPLOYED    Comment: Disability  Tobacco Use   Smoking status: Former    Packs/day: 0.10    Years: 6.00    Pack years: 0.60    Types: Cigarettes    Quit date: 02/14/2011    Years since quitting: 10.8   Smokeless tobacco: Never  Vaping Use   Vaping Use: Never used  Substance and Sexual Activity   Alcohol use: No    Comment: Previous ETOH abuse   Drug use: No    Comment: Previous crack use     Sexual activity: Never    Birth control/protection: Abstinence  Other Topics Concern   Not on file  Social History Narrative   Not on file   Social Determinants of Health   Financial Resource Strain: Not on file  Food Insecurity: Not on file  Transportation Needs: Not on file  Physical Activity: Not on file  Stress: Not on file  Social Connections: Not on file  Intimate Partner Violence: Not on file    FAMILY HISTORY: Family History  Problem Relation Age of Onset   Diabetes Mother    Hypertension Mother    Cancer Mother    Deep vein thrombosis Mother        and PE   Diabetes Father    Hypertension Father    Cancer Father 4   Throat cancer Brother 32   Pulmonary embolism Daughter    Heart disease Other        No family history    ALLERGIES:  is allergic to buprenorphine hcl-naloxone hcl, naloxone, and dilaudid [hydromorphone hcl].  MEDICATIONS:  Current Outpatient Medications  Medication Sig Dispense Refill   allopurinol (ZYLOPRIM) 100 MG tablet Take 100 mg by mouth daily.     aspirin 81 MG chewable tablet Chew 81 mg by mouth daily.     atorvastatin (LIPITOR) 40 MG tablet Take 40 mg by mouth daily.     Blood Glucose Monitoring Suppl (ACCU-CHEK GUIDE ME) w/Device KIT      buPROPion (WELLBUTRIN XL) 300 MG 24 hr tablet Take 300 mg by mouth at bedtime.     cholecalciferol (VITAMIN D3) 25 MCG (1000 UT) tablet Take 2,000 Units by mouth daily.     [employer] MAGNESIUM OXIDE 250 MG TABS Take 1 tablet by mouth daily.     cyclobenzaprine (FLEXERIL) 10 MG tablet Take 10 mg by mouth 2 (two) times daily as needed for muscle spasms.      cyclobenzaprine (FLEXERIL) 5 MG tablet Take 5 mg by mouth 2 (two) times daily.     diazepam (VALIUM) 5 MG tablet Take one tablet by mouth with food one hour prior to procedure. May repeat 30 minutes prior if needed. 2 tablet 0   diclofenac sodium (VOLTAREN) 1 % GEL Apply 4 g topically 4 (four) times daily. 100 g 0   furosemide (LASIX) 20 MG tablet  Take 20 mg by mouth daily.     gabapentin (NEURONTIN) 400 MG capsule Take 400 mg by mouth 3 (three) times daily.     glucose blood test strip TEST GLUCOSE TWICE A DAY     lidocaine (XYLOCAINE) 5 % ointment Apply 1 application topically as needed for moderate pain.     loratadine (CLARITIN) 10 MG tablet Take 10 mg by mouth daily.     MELATONIN PO Take by mouth at bedtime as  needed.     meloxicam (MOBIC) 15 MG tablet Take 15 mg by mouth daily.      metoprolol succinate (TOPROL-XL) 50 MG 24 hr tablet Take 50 mg by mouth daily. Take with or immediately following a meal.     naloxegol oxalate (MOVANTIK) 25 MG TABS tablet Take 25 mg by mouth daily.      NARCAN 4 MG/0.1ML LIQD nasal spray kit SMARTSIG:1 Spray(s) Both Nares Once PRN     oxyCODONE-acetaminophen (PERCOCET) 10-325 MG tablet Take 1 tablet by mouth 4 (four) times daily.      OZEMPIC, 0.25 OR 0.5 MG/DOSE, 2 MG/1.5ML SOPN Inject into the skin.     polyethylene glycol (MIRALAX / GLYCOLAX) packet Take 17 g by mouth daily as needed for moderate constipation.     potassium chloride SA (K-DUR,KLOR-CON) 20 MEQ tablet Take 20 mEq by mouth daily.     ROGAINE MENS EXTRA STRENGTH 5 % FOAM Apply 1 application topically daily.     Vitamin D, Ergocalciferol, (DRISDOL) 1.25 MG (50000 UNIT) CAPS capsule Take 50,000 Units by mouth once a week.     No current facility-administered medications for this visit.    REVIEW OF SYSTEMS:   Constitutional: ( - ) fevers, ( - )  chills , ( - ) night sweats Eyes: ( - ) blurriness of vision, ( - ) double vision, ( - ) watery eyes Ears, nose, mouth, throat, and face: ( - ) mucositis, ( - ) sore throat Respiratory: ( - ) cough, ( - ) dyspnea, ( - ) wheezes Cardiovascular: ( - ) palpitation, ( - ) chest discomfort, ( - ) lower extremity swelling Gastrointestinal:  ( - ) nausea, ( - ) heartburn, ( - ) change in bowel habits Skin: ( - ) abnormal skin rashes Lymphatics: ( - ) new lymphadenopathy, ( - ) easy  bruising Neurological: ( - ) numbness, ( - ) tingling, ( - ) new weaknesses Behavioral/Psych: ( - ) mood change, ( - ) new changes  All other systems were reviewed with the patient and are negative.  PHYSICAL EXAMINATION: ECOG PERFORMANCE STATUS: 1 - Symptomatic but completely ambulatory  Vitals:   12/08/21 0842  BP: 124/60  Pulse: 64  Resp: 16  Temp: (!) 97.5 F (36.4 C)  SpO2: 96%    Filed Weights   12/08/21 0842  Weight: (!) 318 lb (144.2 kg)     GENERAL: well appearing female in NAD  SKIN: skin color, texture, turgor are normal, no rashes or significant lesions EYES: conjunctiva are pink and non-injected, sclera clear OROPHARYNX: no exudate, no erythema; lips, buccal mucosa, and tongue normal  LUNGS: clear to auscultation and percussion with normal breathing effort HEART: regular rate & rhythm and no murmurs and no lower extremity edema ABDOMEN: soft, non-tender, non-distended, normal bowel sounds Musculoskeletal: no cyanosis of digits and no clubbing  PSYCH: alert & oriented x 3, fluent speech NEURO: no focal motor/sensory deficits  LABORATORY DATA:  I have reviewed the data as listed CBC Latest Ref Rng & Units 12/08/2021 09/05/2021 05/20/2021  WBC 4.0 - 10.5 K/uL 6.5 4.9 6.3  Hemoglobin 12.0 - 15.0 g/dL 12.3 11.5(L) 12.4  Hematocrit 36.0 - 46.0 % 36.6 34.9(L) 36.0  Platelets 150 - 400 K/uL 257 261 251    CMP Latest Ref Rng & Units 09/05/2021 05/20/2021 04/18/2021  Glucose 70 - 99 mg/dL 117(H) 91 104(H)  BUN 8 - 23 mg/dL '8 11 10  ' Creatinine 0.44 - 1.00 mg/dL 0.86 1.09(H) 1.06(H)  Sodium 135 - 145 mmol/L 145 145 143  Potassium 3.5 - 5.1 mmol/L 4.0 4.1 4.4  Chloride 98 - 111 mmol/L 107 105 105  CO2 22 - 32 mmol/L 30 32 32  Calcium 8.9 - 10.3 mg/dL 8.9 9.6 9.3  Total Protein 6.5 - 8.1 g/dL 7.0 7.8 7.0  Total Bilirubin 0.3 - 1.2 mg/dL 0.3 0.3 0.4  Alkaline Phos 38 - 126 U/L 53 60 -  AST 15 - 41 U/L 22 32 22  ALT 0 - 44 U/L 26 59(H) 31(H)   ASSESSMENT &  PLAN [name] is a 61 y.o. female returns for a follow up for elevated Anticardiolipin and Beta-2 Glycoprotein antibodies.   After review the labs, review the records, discussion with the patient the findings most consistent with antiphospholipid antibody syndrome.  She has a history of VTE and positive antiphospholipid antibodies.  She meets the diagnostic criteria for APS.  The safest way forward to be for the patient to start Coumadin therapy to prevent future VTE.  At this time the patient declines and would prefer to avoid Coumadin therapy.  She notes that she would like to take baby aspirin twice daily for thromboprophylaxis.  Today I stressed to her the importance of anticoagulation therapy and antiphospholipid antibody syndrome.  I informed her that she runs the risk of recurrent VTE, pulmonary embolism, stroke, or heart attack if not on adequate anticoagulation therapy.  She voiced understanding of the risks and benefits and wished to proceed with aspirin alone.  She noted she would call our office that she changed her mind regarding anticoagulation therapy.  We will plan to see her back in approximately 6 months time in order to reevaluate.  #Positive antiphospholipid antibodies: -Labs from 04/18/2021 showed elevated beta-2 glycoprotein IgG at 103.5 and anticardiolipin IgG >112.0.  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 -Recommended to start anticoagulation with warfarin at last visit due to elevated antibody levels but patient declined and is currently on ASA 81 mg BID.  --Labs today included CBC and CMP, both were reviewed requiring no intervention at this time. Lupus anticoagulant, cardiolipin antibodies and beta-2-glycoprotein antibodies are pending.  --we recommend initiating warfarin 5 mg with Lovenox bridge.  Patient declines at this time.  If she were to change her mind we would be happy to see her back early and start this therapy. --RTC in 6 months with  labs  No orders of the defined types were placed in this encounter.  All questions were answered. The patient knows to call the clinic with any problems, questions or concerns.  I have spent a total of 30 minutes minutes of face-to-face and non-face-to-face time, preparing to see the patient, performing a medically appropriate examination, counseling and educating the patient, documenting clinical information in the electronic health record, and care coordination.   Ledell Peoples, MD Department of Hematology/Oncology [place] at [facility] Phone: [phone] Pager: [phone] Email: [email or web address]

## 2021-12-09 ENCOUNTER — Telehealth: Payer: Self-pay | Admitting: Hematology and Oncology

## 2021-12-09 NOTE — Telephone Encounter (Signed)
Scheduled per 2/27 los, pt has been called and confirmed  °

## 2021-12-19 ENCOUNTER — Ambulatory Visit
Admission: RE | Admit: 2021-12-19 | Discharge: 2021-12-19 | Disposition: A | Discharge status: Home / Self Care | Payer: Medicaid Other | Source: Ambulatory Visit | Attending: Physical Medicine and Rehabilitation | Admitting: Physical Medicine and Rehabilitation

## 2021-12-19 ENCOUNTER — Other Ambulatory Visit: Payer: Self-pay

## 2021-12-22 ENCOUNTER — Encounter: Payer: Self-pay | Admitting: Physical Medicine and Rehabilitation

## 2021-12-22 ENCOUNTER — Ambulatory Visit: Payer: Medicaid Other | Admitting: Physical Medicine and Rehabilitation

## 2021-12-22 ENCOUNTER — Ambulatory Visit: Payer: Self-pay

## 2021-12-22 ENCOUNTER — Other Ambulatory Visit: Payer: Self-pay

## 2021-12-22 DIAGNOSIS — M25552 Pain in left hip: Secondary | ICD-10-CM

## 2021-12-22 NOTE — Progress Notes (Signed)
Pt state left hip pain. Pt state walking, standing and laying down makes the pain worse. Pt state she takes pain meds, uses heat and a tens unit to help ease her pain. ? ?Numeric Pain Rating Scale and Functional Assessment ?Average Pain 4 ? ? ?In the last MONTH (on 0-10 scale) has pain interfered with the following? ? ?1. General activity like being  able to carry out your everyday physical activities such as walking, climbing stairs, carrying groceries, or moving a chair?  ?Rating(10) ? ? ?-BT, -Dye Allergies. ? ?

## 2021-12-22 NOTE — Progress Notes (Unsigned)
° °  [name] - 61 y.o. female MRN [id number]  Date of birth: Mar 03, 1961  Office Visit Note: Visit Date: 12/22/2021 PCP: Riki Sheer, NP Referred by: Riki Sheer, NP  Subjective: No chief complaint on file.  HPI:  [name] is a 61 y.o. female who comes in today at the request of Barnet Pall, FNP for planned Left anesthetic hip arthrogram with fluoroscopic guidance.  The patient has failed conservative care including home exercise, medications, time and activity modification.  This injection will be diagnostic and hopefully therapeutic.  Please see requesting physician notes for further details and justification.  ROS Otherwise per HPI.  Assessment & Plan: Visit Diagnoses: No diagnosis found.  Plan: No additional findings.   Meds & Orders: No orders of the defined types were placed in this encounter.  No orders of the defined types were placed in this encounter.   Follow-up: No follow-ups on file.   Procedures: Large Joint Inj: L hip joint on 12/22/2021 8:26 AM Indications: diagnostic evaluation and pain Details: 22 G 3.5 in needle, fluoroscopy-guided anterior approach  Arthrogram: No  Medications: 4 mL bupivacaine 0.25 %; 60 mg triamcinolone acetonide 40 MG/ML Outcome: tolerated well, no immediate complications  There was excellent flow of contrast producing a partial arthrogram of the hip. The patient did have relief of symptoms during the anesthetic phase of the injection. Procedure, treatment alternatives, risks and benefits explained, specific risks discussed. Consent was given by the patient. Immediately prior to procedure a time out was called to verify the correct patient, procedure, equipment, support staff and site/side marked as required. Patient was prepped and draped in the usual sterile fashion.         Clinical History: No specialty comments available.     Objective:  VS:  HT:     WT:    BMI:      BP:    HR: bpm   TEMP:  ( )   RESP:  Physical Exam   Imaging: No results found.

## 2021-12-27 MED ORDER — TRIAMCINOLONE ACETONIDE 40 MG/ML IJ SUSP
60.0000 mg | INTRAMUSCULAR | Status: AC | PRN
  Administered 2021-12-22: 60 mg via INTRA_ARTICULAR

## 2021-12-27 MED ORDER — BUPIVACAINE HCL 0.25 % IJ SOLN
4.0000 mL | INTRAMUSCULAR | Status: AC | PRN
  Administered 2021-12-22: 4 mL via INTRA_ARTICULAR

## 2021-12-29 ENCOUNTER — Other Ambulatory Visit: Payer: Self-pay | Admitting: Nurse Practitioner

## 2021-12-29 DIAGNOSIS — Z1231 Encounter for screening mammogram for malignant neoplasm of breast: Secondary | ICD-10-CM

## 2022-01-01 ENCOUNTER — Other Ambulatory Visit: Payer: Self-pay

## 2022-01-01 ENCOUNTER — Ambulatory Visit: Payer: Medicaid Other | Admitting: Physical Medicine and Rehabilitation

## 2022-01-01 ENCOUNTER — Encounter: Payer: Self-pay | Admitting: Physical Medicine and Rehabilitation

## 2022-01-01 DIAGNOSIS — M545 Low back pain, unspecified: Secondary | ICD-10-CM | POA: Diagnosis not present

## 2022-01-01 DIAGNOSIS — G8929 Other chronic pain: Secondary | ICD-10-CM

## 2022-01-01 DIAGNOSIS — G894 Chronic pain syndrome: Secondary | ICD-10-CM

## 2022-01-01 DIAGNOSIS — M47816 Spondylosis without myelopathy or radiculopathy, lumbar region: Secondary | ICD-10-CM | POA: Diagnosis not present

## 2022-01-01 DIAGNOSIS — M25552 Pain in left hip: Secondary | ICD-10-CM

## 2022-01-01 NOTE — Progress Notes (Signed)
[name] - 61 y.o. female MRN [id number]  Date of birth: April 22, 1961  Office Visit Note: Visit Date: 01/01/2022 PCP: Shanna Cisco, NP Referred by: Shanna Cisco, NP  Subjective: Chief Complaint  Patient presents with   Lower Back - Pain   Left Leg - Pain   HPI: [name] is a 61 y.o. female who comes in today for evaluation of chronic, worsening and severe bilateral lower back pain.  Patient reports pain has been ongoing for several years and is exacerbated by moving from a sitting to standing position, bending and activity.  Patient states her pain did worsen significantly after she sustained mechanical fall in grocery store in April 2022.  She describes her pain as a constant sore and stiff sensation, currently rates as 8 out of 10.  Patient reports some relief of pain with home exercise regimen, rest, TENS unit and medications. Patient has recently attended formal physical therapy at [facility] and reports some relief of pain with these treatments. Patients chronic pain is being managed at [facility] by Charlies Silvers, FNP and is currently prescribed 180 tablets of Percocet 10-325 mg per month.  Patient's recent lumbar MRI exhibits multilevel facet hypertrophy, most severe at L4-L5 and L5-S1. Right foraminal disc protrusion that could affect the exiting right L4 nerve at L4-L5, there is also mild spinal canal and bilateral subarticular narrowing at this level. No high grade spinal canal stenosis. Patient did have bilateral L4-L5 radiofrequency ablation in 2021 at [facility] and reports significant relief of pain with this procedure. Patient states her pain is a constant source of aggravation for her and does negatively impact her daily life. Patient currently using cane to assist with ambulation and prevent falls. Patient denies focal weakness, numbness and tingling. Patient denies recent trauma or falls.   Chronic  left hip/groin pain. Patient recently had left intra-articular hip injection performed in our office on 12/22/2021 and reports greater than 75% relief of pain with this procedure. Patient states she does have mild pain to left hip/groin, but is able to manage this discomfort at home. Patient states increased functional ability and feels that she is walking better. Patient has previously been evaluated by Dr. Norlene Campbell post fall in 2022 and for various other orthopedic complaints.      Review of Systems  Musculoskeletal:  Positive for back pain and joint pain.  Neurological:  Negative for tingling, sensory change, focal weakness and weakness.  All other systems reviewed and are negative. Otherwise per HPI.  Assessment & Plan: Visit Diagnoses:    ICD-10-CM   1. Chronic bilateral low back pain without sciatica  M54.50 Ambulatory referral to Physical Medicine Rehab   G89.29     2. Spondylosis without myelopathy or radiculopathy, lumbar region  M47.816 Ambulatory referral to Physical Medicine Rehab    3. Facet hypertrophy of lumbar region  M47.816 Ambulatory referral to Physical Medicine Rehab    4. Chronic pain syndrome  G89.4 Ambulatory referral to Physical Medicine Rehab    5. Morbid (severe) obesity due to excess calories (HCC)  E66.01 Ambulatory referral to Physical Medicine Rehab    6. Pain in left hip  M25.552 Ambulatory referral to Physical Medicine Rehab    Ambulatory referral to Orthopedic Surgery       Plan: Findings:  1. Chronic, worsening and severe bilateral axial back pain.  Patient continues to have excruciating and debilitating pain despite good conservative therapies such as formal  physical therapy, home exercise regimen, rest and use of medications.  Patient's clinical presentation and exam are consistent with facet mediated pain.  We believe the next step is to perform diagnostic and hopefully therapeutic bilateral L4-L5 and L5-S1 facet joint/medial branch blocks  under fluoroscopic guidance.  If patient gets good relief of pain with medial branch blocks we did discuss the possibility of longer sustained pain relief with radiofrequency ablation.  Patient had radiofrequency ablation in 2012 and did get significant relief of pain with this procedure. Patient is agreeable with plan of care and has no questions at this time. No red flag symptoms noted upon exam today.   2. Chronic left hip/groin pain. Significant and sustained improvement of pain, greater than 75% with recent left intra-articular injection performed in our office. We did speak with patient about repeating injection as needed. We do think formal evaluation by orthopedic specialist would be beneficial for her. I did place a referral to Dr. Glee Arvin today.    Meds & Orders: No orders of the defined types were placed in this encounter.   Orders Placed This Encounter  Procedures   Ambulatory referral to Physical Medicine Rehab   Ambulatory referral to Orthopedic Surgery    Follow-up: Return for Bilateral L4-L5 and L5-S1 facet joint/medial branch blocks.   Procedures: No procedures performed      Clinical History: MRI LUMBAR SPINE WITHOUT CONTRAST   TECHNIQUE: Multiplanar, multisequence MR imaging of the lumbar spine was performed. No intravenous contrast was administered.   COMPARISON:  Prior radiograph from a 02/02/2021 and MRI from 07/28/2017.   FINDINGS: Segmentation: Standard. Lowest well-formed disc space labeled the L5-S1 level.   Alignment: Exaggeration of the normal lumbar lordosis. Trace stepwise retrolisthesis of T12 on L1 and L1 on L2. Trace facet mediated anterolisthesis of L4 on L5.   Vertebrae: Vertebral body height maintained without acute or chronic fracture. Bone marrow signal intensity heterogeneous but overall within normal limits. No discrete or worrisome osseous lesions. No abnormal marrow edema.   Conus medullaris and cauda equina: Conus extends to the  L1-2 level. Conus and cauda equina appear normal.   Paraspinal and other soft tissues: Paraspinous soft tissues within normal limits. Few small benign appearing cyst noted about the visualized kidneys. Visualized visceral structures otherwise unremarkable.   Disc levels:   T11-12: Mild disc bulge with disc desiccation. Left greater than right facet hypertrophy. No significant spinal stenosis. Mild left foraminal narrowing. Right neural foramen remains patent.   T12-L1: Mild disc bulge with disc desiccation. No spinal stenosis. Foramina remain patent.   L1-2: Intervertebral disc space narrowing with diffuse disc bulge and disc desiccation. Superimposed right foraminal to extraforaminal disc protrusion (series 6, image 19). No spinal stenosis. Mild right L1 foraminal narrowing. Left neural foramen remains patent.   L2-3: Negative interspace. Mild facet and ligament flavum hypertrophy. No canal or foraminal stenosis.   L3-4: Negative interspace. Mild to moderate facet and ligament flavum hypertrophy. No spinal stenosis. Mild left greater than right L3 foraminal narrowing.   L4-5: Trace anterolisthesis. Mild disc bulge with disc desiccation. Superimposed small right foraminal disc protrusion closely approximates and/or contacts the exiting right L4 nerve root (series 6, image 37). Advanced bilateral facet arthrosis with ligament flavum hypertrophy. Resultant mild canal with moderate bilateral subarticular stenosis. Moderate bilateral L4 foraminal narrowing, worse on the right.   L5-S1: Disc desiccation with diffuse disc bulge. Mild reactive endplate spurring. Moderate left worse than right facet hypertrophy. No significant spinal stenosis. Moderate left  worse than right L5 foraminal stenosis.   IMPRESSION: 1. Multifactorial degenerative changes at L4-5 with resultant mild canal with moderate bilateral subarticular stenosis, with moderate bilateral L4 foraminal narrowing.  Superimposed right foraminal disc protrusion at this level could potentially affect the exiting right L4 nerve root. 2. Disc bulging with facet hypertrophy at L5-S1 with resultant moderate left worse than right L5 foraminal stenosis. 3. Additional mild noncompressive disc bulging with facet hypertrophy at L1-2 and L2-3 without significant stenosis or neural impingement.     Electronically Signed   By: Rise Mu M.D.   On: 12/20/2021 03:21   She reports that she quit smoking about 10 years ago. Her smoking use included cigarettes. She has a 0.60 pack-year smoking history. She has never used smokeless tobacco. No results for input(s): HGBA1C, LABURIC in the last 8760 hours.  Objective:  VS:  HT:    WT:   BMI:     BP:   HR: bpm  TEMP: ( )  RESP:  Physical Exam Vitals and nursing note reviewed.  HENT:     Head: Normocephalic and atraumatic.     Right Ear: External ear normal.     Left Ear: External ear normal.     Nose: Nose normal.     Mouth/Throat:     Mouth: Mucous membranes are moist.  Eyes:     Extraocular Movements: Extraocular movements intact.  Cardiovascular:     Rate and Rhythm: Normal rate.     Pulses: Normal pulses.  Pulmonary:     Effort: Pulmonary effort is normal.  Abdominal:     General: Abdomen is flat. There is no distension.  Musculoskeletal:        General: Tenderness present.     Cervical back: Normal range of motion.     Comments: Pt is slow to rises from seated position to standing. Concordant low back pain with facet loading, lumbar spine extension and rotation. Strong distal strength without clonus, no pain upon palpation of greater trochanters. No pain noted with internal/external rotation of left hip. Sensation intact bilaterally. Ambulates with cane, gait slow and unsteady.   Skin:    General: Skin is warm and dry.     Capillary Refill: Capillary refill takes less than 2 seconds.  Neurological:     Mental Status: She is alert and  oriented to person, place, and time.     Gait: Gait abnormal.  Psychiatric:        Mood and Affect: Mood normal.        Behavior: Behavior normal.    Ortho Exam  Imaging: No results found.  Past Medical/Family/Surgical/Social History: Medications & Allergies reviewed per EMR, new medications updated. Patient Active Problem List   Diagnosis Date Noted   Plantar fasciitis of right foot 09/03/2021   Pes planus 09/03/2021   Nuclear sclerotic cataract of both eyes 05/27/2021   Diabetes mellitus without complication (HCC) 05/27/2021   Vitreomacular adhesion of both eyes 05/27/2021   Antiphospholipid antibody positive 05/21/2021   Pain in left elbow 02/19/2021   Chronic left-sided low back pain with left-sided sciatica 07/22/2017   Thyroid nodule 04/16/2017   Essential hypertension 03/13/2014   Menometrorrhagia    Dysmenorrhea    Anemia    Hyperlipidemia 05/23/2007   OBESITY, MORBID 05/23/2007   DYSFUNCTIONAL UTERINE BLEEDING - s/p LAVH 05/23/2007   ROTATOR CUFF SYNDROME 05/23/2007   KNEE PAIN, HX OF 05/23/2007   Past Medical History:  Diagnosis Date   Anemia    Anxiety  Arthritis    Diabetes mellitus without complication (HCC)    recent dx diet controlled   DVT (deep venous thrombosis) (HCC)    remote - took coumadin   Dysmenorrhea    Dysrhythmia    irregular heart beat   Fibroid    GERD (gastroesophageal reflux disease)    hx of   Gout    Hypercholesteremia    Hypertension 4 months   Menometrorrhagia    Morbid obesity (HCC)    Prediabetes    Substance abuse (HCC)    Family History  Problem Relation Age of Onset   Diabetes Mother    Hypertension Mother    Cancer Mother    Deep vein thrombosis Mother        and PE   Diabetes Father    Hypertension Father    Cancer Father 17   Throat cancer Brother 64   Pulmonary embolism Daughter    Heart disease Other        No family history   Past Surgical History:  Procedure Laterality Date   ABDOMINAL  HYSTERECTOMY  2008   BREAST BIOPSY Left 2013   benign   BUNIONECTOMY Bilateral    CHOLECYSTECTOMY     COLONOSCOPY WITH PROPOFOL N/A 10/27/2018   Procedure: COLONOSCOPY WITH PROPOFOL;  Surgeon: Graylin Shiver, MD;  Location: [facility];  Service: Endoscopy;  Laterality: N/A;   ECTOPIC PREGNANCY SURGERY  1980's   MANDIBLE OSTEOTOMY Bilateral 07/03/2013   Procedure: BILATERAL TORI;  Surgeon: Georgia Lopes, DDS;  Location: [facility] OR;  Service: Oral Surgery;  Laterality: Bilateral;   NM MYOCAR PERF WALL MOTION  07/2012   lexiscan - normal pattern of perfusion, low risk   ROTATOR CUFF REPAIR Bilateral 2009   SLEEP STUDY  07/27/2012   AHI 4.8/hr   TOOTH EXTRACTION N/A 07/03/2013   Procedure: DENTAL EXTRACTION # 20;  Surgeon: Georgia Lopes, DDS;  Location: [facility] OR;  Service: Oral Surgery;  Laterality: N/A;   TRANSTHORACIC ECHOCARDIOGRAM  07/2012   EF=>55%, mod conc LVH; LA mod dilated; trace MR; mild TR with normal RVSP; trace AV regurg   TUBAL LIGATION     Social History   Occupational History    Employer: UNEMPLOYED    Comment: Disability  Tobacco Use   Smoking status: Former    Packs/day: 0.10    Years: 6.00    Pack years: 0.60    Types: Cigarettes    Quit date: 02/14/2011    Years since quitting: 10.8   Smokeless tobacco: Never  Vaping Use   Vaping Use: Never used  Substance and Sexual Activity   Alcohol use: No    Comment: Previous ETOH abuse   Drug use: No    Comment: Previous crack use    Sexual activity: Never    Birth control/protection: Abstinence

## 2022-01-01 NOTE — Progress Notes (Signed)
Pt state lower back pain that travels left leg. Pt state any movement makes the pain worse. Pt state she takes pain meds to help ease her pain. Pt here today for MRI review. ? ?Numeric Pain Rating Scale and Functional Assessment ?Average Pain 7 ?Pain Right Now 3 ?My pain is intermittent, sharp, dull, stabbing, and aching ?Pain is worse with: walking, bending, sitting, standing, and some activites ?Pain improves with: medication and injections ? ? ?In the last MONTH (on 0-10 scale) has pain interfered with the following? ? ?1. General activity like being  able to carry out your everyday physical activities such as walking, climbing stairs, carrying groceries, or moving a chair?  ?Rating(6) ? ?2. Relation with others like being able to carry out your usual social activities and roles such as  activities at home, at work and in your community. ?Rating(7) ? ?3. Enjoyment of life such that you have  been bothered by emotional problems such as feeling anxious, depressed or irritable?  ?Rating(8) ? ?

## 2022-01-08 ENCOUNTER — Encounter: Payer: Self-pay | Admitting: Orthopaedic Surgery

## 2022-01-08 ENCOUNTER — Ambulatory Visit (INDEPENDENT_AMBULATORY_CARE_PROVIDER_SITE_OTHER): Payer: Medicaid Other

## 2022-01-08 ENCOUNTER — Ambulatory Visit: Payer: Medicaid Other | Admitting: Orthopaedic Surgery

## 2022-01-08 VITALS — Ht 66.0 in | Wt 322.0 lb

## 2022-01-08 DIAGNOSIS — Z6841 Body Mass Index (BMI) 40.0 and over, adult: Secondary | ICD-10-CM

## 2022-01-08 DIAGNOSIS — M1612 Unilateral primary osteoarthritis, left hip: Secondary | ICD-10-CM

## 2022-01-08 NOTE — Progress Notes (Signed)
? ?Office Visit Note ?  ?Patient: [name]           ?Date of Birth: 09-05-1961           ?MRN: [id number] ?Visit Date: 01/08/2022 ?             ?Requested by: Lorine Bears, NP ?[address] ?[place],  [place] [address] ?PCP: Riki Sheer, NP ? ? ?Assessment & Plan: ?Visit Diagnoses:  ?1. Primary osteoarthritis of left hip   ?2. Morbid obesity ([place])   ?3. Body mass index 50.0-59.9, adult ([place])   ? ? ?Plan: Impression is left hip osteoarthritis.  The patient had good, but temporary relief following the cortisone injection.  We have further discussed left total hip arthroplasty for which she would like to proceed.  She is currently at a BMI of 51 and will need to get to 245 pounds in order to obtain a BMI of less than 40.  She would like referral to weight management.  She will follow-up with [place] once she has met her goal.  Call with concerns or questions in the meantime. ? ?The patient meets the AMA guidelines for Morbid (severe) obesity with a BMI > 40.0 and I have recommended weight loss. ? ?Follow-Up Instructions: Return if symptoms worsen or fail to improve.  ? ?Orders:  ?Orders Placed This Encounter  ?Procedures  ? XR HIP UNILAT W OR W/O PELVIS 2-3 VIEWS LEFT  ? Amb Ref to Medical Weight Management  ? ?No orders of the defined types were placed in this encounter. ? ? ? ? Procedures: ?No procedures performed ? ? ?Clinical Data: ?No additional findings. ? ? ?Subjective: ?Chief Complaint  ?Patient presents with  ? Left Hip - Pain  ? ? ?HPI patient is a pleasant 61 year old female who comes in today for left hip pain.  Majority of her pain is to the groin with occasional radiation to the lateral hip.  Is been ongoing for years but worsened after she sustained a fall about a year ago.  She describes a constant pain at rest with increased symptoms with hip flexion as well as when she is walking long distance.  She has pain at night as well.  She denies any paresthesias.  She is on chronic  oxycodone by pain management.  She underwent left hip intra-articular cortisone injection by Dr. Ernestina Patches on 12/22/2021 with great, but temporary relief. ? ?Review of Systems as detailed in HPI.  All others reviewed and are negative. ? ? ?Objective: ?Vital Signs: Ht '5\' 6"'  (1.676 m)   Wt (!) 322 lb (146.1 kg)   BMI 51.97 kg/m?  ? ?Physical Exam well-developed well-nourished female no acute distress.  Alert and oriented x3. ? ?Ortho Exam left hip exam shows a positive logroll and positive FADIR.  Positive [place].  She is neurovascular intact distally. ? ?Specialty Comments:  ?MRI LUMBAR SPINE WITHOUT CONTRAST ?  ?TECHNIQUE: ?Multiplanar, multisequence MR imaging of the lumbar spine was ?performed. No intravenous contrast was administered. ?  ?COMPARISON:  Prior radiograph from a 02/02/2021 and MRI from ?07/28/2017. ?  ?FINDINGS: ?Segmentation: Standard. Lowest well-formed disc space labeled the ?L5-S1 level. ?  ?Alignment: Exaggeration of the normal lumbar lordosis. Trace ?stepwise retrolisthesis of T12 on L1 and L1 on L2. Trace facet ?mediated anterolisthesis of L4 on L5. ?  ?Vertebrae: Vertebral body height maintained without acute or chronic ?fracture. Bone marrow signal intensity heterogeneous but overall ?within normal limits. No discrete or worrisome osseous lesions. No ?abnormal  marrow edema. ?  ?Conus medullaris and cauda equina: Conus extends to the L1-2 level. ?Conus and cauda equina appear normal. ?  ?Paraspinal and other soft tissues: Paraspinous soft tissues within ?normal limits. Few small benign appearing cyst noted about the ?visualized kidneys. Visualized visceral structures otherwise ?unremarkable. ?  ?Disc levels: ?  ?T11-12: Mild disc bulge with disc desiccation. Left greater than ?right facet hypertrophy. No significant spinal stenosis. Mild left ?foraminal narrowing. Right neural foramen remains patent. ?  ?T12-L1: Mild disc bulge with disc desiccation. No spinal stenosis. ?Foramina remain  patent. ?  ?L1-2: Intervertebral disc space narrowing with diffuse disc bulge ?and disc desiccation. Superimposed right foraminal to extraforaminal ?disc protrusion (series 6, image 19). No spinal stenosis. Mild right ?L1 foraminal narrowing. Left neural foramen remains patent. ?  ?L2-3: Negative interspace. Mild facet and ligament flavum ?hypertrophy. No canal or foraminal stenosis. ?  ?L3-4: Negative interspace. Mild to moderate facet and ligament ?flavum hypertrophy. No spinal stenosis. Mild left greater than right ?L3 foraminal narrowing. ?  ?L4-5: Trace anterolisthesis. Mild disc bulge with disc desiccation. ?Superimposed small right foraminal disc protrusion closely ?approximates and/or contacts the exiting right L4 nerve root (series ?6, image 37). Advanced bilateral facet arthrosis with ligament ?flavum hypertrophy. Resultant mild canal with moderate bilateral ?subarticular stenosis. Moderate bilateral L4 foraminal narrowing, ?worse on the right. ?  ?L5-S1: Disc desiccation with diffuse disc bulge. Mild reactive ?endplate spurring. Moderate left worse than right facet hypertrophy. ?No significant spinal stenosis. Moderate left worse than right L5 ?foraminal stenosis. ?  ?IMPRESSION: ?1. Multifactorial degenerative changes at L4-5 with resultant mild ?canal with moderate bilateral subarticular stenosis, with moderate ?bilateral L4 foraminal narrowing. Superimposed right foraminal disc ?protrusion at this level could potentially affect the exiting right ?L4 nerve root. ?2. Disc bulging with facet hypertrophy at L5-S1 with resultant ?moderate left worse than right L5 foraminal stenosis. ?3. Additional mild noncompressive disc bulging with facet ?hypertrophy at L1-2 and L2-3 without significant stenosis or neural ?impingement. ?  ?  ?Electronically Signed ?  By: Jeannine Boga M.D. ?  On: 12/20/2021 03:21 ? ?Imaging: ?XR HIP UNILAT W OR W/O PELVIS 2-3 VIEWS LEFT ? ?Result Date: 01/08/2022 ?Moderate  bilateral hip degenerative joint disease  ? ? ?PMFS History: ?Patient Active Problem List  ? Diagnosis Date Noted  ? Plantar fasciitis of right foot 09/03/2021  ? Pes planus 09/03/2021  ? Nuclear sclerotic cataract of both eyes 05/27/2021  ? Diabetes mellitus without complication ([place]) 31/51/7616  ? Vitreomacular adhesion of both eyes 05/27/2021  ? Antiphospholipid antibody positive 05/21/2021  ? Pain in left elbow 02/19/2021  ? Chronic left-sided low back pain with left-sided sciatica 07/22/2017  ? Thyroid nodule 04/16/2017  ? Essential hypertension 03/13/2014  ? Menometrorrhagia   ? Dysmenorrhea   ? Anemia   ? Hyperlipidemia 05/23/2007  ? OBESITY, MORBID 05/23/2007  ? DYSFUNCTIONAL UTERINE BLEEDING - s/p LAVH 05/23/2007  ? ROTATOR CUFF SYNDROME 05/23/2007  ? KNEE PAIN, HX OF 05/23/2007  ? ?Past Medical History:  ?Diagnosis Date  ? Anemia   ? Anxiety   ? Arthritis   ? Diabetes mellitus without complication ([place])   ? recent dx diet controlled  ? DVT (deep venous thrombosis) ([place])   ? remote - took coumadin  ? Dysmenorrhea   ? Dysrhythmia   ? irregular heart beat  ? Fibroid   ? GERD (gastroesophageal reflux disease)   ? hx of  ? Gout   ? Hypercholesteremia   ? Hypertension 4 months  ?  Menometrorrhagia   ? Morbid obesity ([place])   ? Prediabetes   ? Substance abuse ([place])   ?  ?Family History  ?Problem Relation Age of Onset  ? Diabetes Mother   ? Hypertension Mother   ? Cancer Mother   ? Deep vein thrombosis Mother   ?     and PE  ? Diabetes Father   ? Hypertension Father   ? Cancer Father 10  ? Throat cancer Brother 72  ? Pulmonary embolism Daughter   ? Heart disease Other   ?     No family history  ?  ?Past Surgical History:  ?Procedure Laterality Date  ? ABDOMINAL HYSTERECTOMY  2008  ? BREAST BIOPSY Left 2013  ? benign  ? BUNIONECTOMY Bilateral   ? CHOLECYSTECTOMY    ? COLONOSCOPY WITH PROPOFOL N/A 10/27/2018  ? Procedure: COLONOSCOPY WITH PROPOFOL;  Surgeon: Wonda Horner, MD;  Location: [facility];  Service:  Endoscopy;  Laterality: N/A;  ? ECTOPIC PREGNANCY SURGERY  1980's  ? MANDIBLE OSTEOTOMY Bilateral 07/03/2013  ? Procedure: BILATERAL TORI;  Surgeon: Gae Bon, DDS;  Location: [place];  Service: Oral Surgery;  Laterality: Bi

## 2022-01-28 ENCOUNTER — Ambulatory Visit
Admission: RE | Admit: 2022-01-28 | Discharge: 2022-01-28 | Disposition: A | Discharge status: Home / Self Care | Payer: Medicaid Other | Source: Ambulatory Visit | Attending: Nurse Practitioner | Admitting: Nurse Practitioner

## 2022-01-28 DIAGNOSIS — Z1231 Encounter for screening mammogram for malignant neoplasm of breast: Secondary | ICD-10-CM

## 2022-02-13 ENCOUNTER — Telehealth: Payer: Self-pay | Admitting: Physical Medicine and Rehabilitation

## 2022-02-13 NOTE — Telephone Encounter (Signed)
Patient returned call asked for a call back.   Ph # [phone] ?

## 2022-02-16 NOTE — Telephone Encounter (Signed)
Called number in pt chart and someone answered but did not claim to know a [name]. Send to me if pt calls back.  ?

## 2022-02-24 ENCOUNTER — Encounter: Payer: Self-pay | Admitting: Physical Medicine and Rehabilitation

## 2022-02-24 ENCOUNTER — Ambulatory Visit: Payer: Medicaid Other | Admitting: Physical Medicine and Rehabilitation

## 2022-02-24 DIAGNOSIS — Z6841 Body Mass Index (BMI) 40.0 and over, adult: Secondary | ICD-10-CM

## 2022-02-24 DIAGNOSIS — M47816 Spondylosis without myelopathy or radiculopathy, lumbar region: Secondary | ICD-10-CM | POA: Diagnosis not present

## 2022-02-24 DIAGNOSIS — M1612 Unilateral primary osteoarthritis, left hip: Secondary | ICD-10-CM

## 2022-02-24 NOTE — Progress Notes (Signed)
Numeric Pain Rating Scale and Functional Assessment Average Pain 3  Had hip injection. 75% better. Still having pain. What are options ?    In the last MONTH (on 0-10 scale) has pain interfered with the following?  1. General activity like being  able to carry out your everyday physical activities such as walking, climbing stairs, carrying groceries, or moving a chair?  Rating(2)

## 2022-02-24 NOTE — Progress Notes (Signed)
[name] - 61 y.o. female MRN [id number]  Date of birth: 1960-12-02  Office Visit Note: Visit Date: 02/24/2022 PCP: Riki Sheer, NP Referred by: Riki Sheer, NP  Subjective: Chief Complaint  Patient presents with   Left Hip - Pain   HPI: [name] is a 61 y.o. female who comes in today for evaluation of chronic, worsening and severe left sided hip/groin pain. Patient states pain has been ongoing for several months and is exacerbated by movement and activity. Patient states her pain worsened significantly after mechanical fall in grocery store in April 2022, she describes pain as constant sore and stiff sensation, currently rates as 8 out of 10. Patient reports some relief of pain with home exercise regimen, rest and medications. Patients chronic pain is being managed at [facility] by Andy Gauss, FNP and is currently prescribed 180 tablets of Percocet 10-325 mg per month. Recent left hip x-rays exhibit moderate bilateral degenerative joint disease. Patient had left intra-articular hip injection performed in our office on 12/22/2021 that provided significant relief of pain, greater than 75%, however relief was short lived. Patient is currently being managed by Dr. Eduard Roux, during recent office visit with him he discussed importance of weight loss with her, she does have referral pending to medical weight management. Dr. Erlinda Hong is hopeful that he can perform left total hip arthroplasty if she is able to get BMI under 40. Patient denies focal weakness, numbness and tingling. Patient denies recent trauma or falls.   Patient continues to have chronic bilateral lower back pain. I did place referral for bilateral L4-L5 and L5-S1 facet joint/medial branch blocks on 01/01/2022, we have been unable to reach patient to schedule, however she would like to hold on lumbar injections at this time. Patient feels chronic left hip pain is more severe at this time.    Review of Systems  Musculoskeletal:  Positive for back pain and joint pain.  Neurological:  Negative for tingling, sensory change, focal weakness and weakness.  All other systems reviewed and are negative. Otherwise per HPI.  Assessment & Plan: Visit Diagnoses:    ICD-10-CM   1. Primary osteoarthritis of left hip  M16.12     2. Morbid obesity ([place])  E66.01     3. Body mass index 50.0-59.9, adult (HCC)  Z68.43     4. Spondylosis without myelopathy or radiculopathy, lumbar region  M47.816        Plan: Findings:  Chronic, worsening and severe left sided hip/groin pain. Patient continues to have severe pain despite good conservative therapies such as home exercise regimen, rest and use of medications. Patients clinical presentation and exam are consistent with mechanical hip issue. Good relief with left intra-articular hip injection on 12/22/2021, we discussed repeating this injections infrequently as needed. Her best option at this time is to move forward with weight management and will hopefully be able to proceed with surgery. I did contact referral department in our office today and patient has been placed on waiting list for weight management, I was informed that the wait list is approximately 6 months out. Patient encouraged to remain active and perform home exercises as tolerated. No red flag symptoms noted upon exam.  Patient would like to hold on lumbar facet joint injections at this time, she feels left hip is more concerning issue. Patient informed to let us know if she changes her mind.    Meds & Orders: No orders of the defined types were  placed in this encounter.  No orders of the defined types were placed in this encounter.   Follow-up: Return if symptoms worsen or fail to improve.   Procedures: No procedures performed      Clinical History: MRI LUMBAR SPINE WITHOUT CONTRAST   TECHNIQUE: Multiplanar, multisequence MR imaging of the lumbar spine was performed. No  intravenous contrast was administered.   COMPARISON:  Prior radiograph from a 02/02/2021 and MRI from 07/28/2017.   FINDINGS: Segmentation: Standard. Lowest well-formed disc space labeled the L5-S1 level.   Alignment: Exaggeration of the normal lumbar lordosis. Trace stepwise retrolisthesis of T12 on L1 and L1 on L2. Trace facet mediated anterolisthesis of L4 on L5.   Vertebrae: Vertebral body height maintained without acute or chronic fracture. Bone marrow signal intensity heterogeneous but overall within normal limits. No discrete or worrisome osseous lesions. No abnormal marrow edema.   Conus medullaris and cauda equina: Conus extends to the L1-2 level. Conus and cauda equina appear normal.   Paraspinal and other soft tissues: Paraspinous soft tissues within normal limits. Few small benign appearing cyst noted about the visualized kidneys. Visualized visceral structures otherwise unremarkable.   Disc levels:   T11-12: Mild disc bulge with disc desiccation. Left greater than right facet hypertrophy. No significant spinal stenosis. Mild left foraminal narrowing. Right neural foramen remains patent.   T12-L1: Mild disc bulge with disc desiccation. No spinal stenosis. Foramina remain patent.   L1-2: Intervertebral disc space narrowing with diffuse disc bulge and disc desiccation. Superimposed right foraminal to extraforaminal disc protrusion (series 6, image 19). No spinal stenosis. Mild right L1 foraminal narrowing. Left neural foramen remains patent.   L2-3: Negative interspace. Mild facet and ligament flavum hypertrophy. No canal or foraminal stenosis.   L3-4: Negative interspace. Mild to moderate facet and ligament flavum hypertrophy. No spinal stenosis. Mild left greater than right L3 foraminal narrowing.   L4-5: Trace anterolisthesis. Mild disc bulge with disc desiccation. Superimposed small right foraminal disc protrusion closely approximates and/or contacts the  exiting right L4 nerve root (series 6, image 37). Advanced bilateral facet arthrosis with ligament flavum hypertrophy. Resultant mild canal with moderate bilateral subarticular stenosis. Moderate bilateral L4 foraminal narrowing, worse on the right.   L5-S1: Disc desiccation with diffuse disc bulge. Mild reactive endplate spurring. Moderate left worse than right facet hypertrophy. No significant spinal stenosis. Moderate left worse than right L5 foraminal stenosis.   IMPRESSION: 1. Multifactorial degenerative changes at L4-5 with resultant mild canal with moderate bilateral subarticular stenosis, with moderate bilateral L4 foraminal narrowing. Superimposed right foraminal disc protrusion at this level could potentially affect the exiting right L4 nerve root. 2. Disc bulging with facet hypertrophy at L5-S1 with resultant moderate left worse than right L5 foraminal stenosis. 3. Additional mild noncompressive disc bulging with facet hypertrophy at L1-2 and L2-3 without significant stenosis or neural impingement.     Electronically Signed   By: Jeannine Boga M.D.   On: 12/20/2021 03:21   She reports that she quit smoking about 11 years ago. Her smoking use included cigarettes. She has a 0.60 pack-year smoking history. She has never used smokeless tobacco. No results for input(s): HGBA1C, LABURIC in the last 8760 hours.  Objective:  VS:  HT:    WT:   BMI:     BP:   HR: bpm  TEMP: ( )  RESP:  Physical Exam Vitals and nursing note reviewed.  HENT:     Head: Normocephalic and atraumatic.     Right Ear:  External ear normal.     Left Ear: External ear normal.     Nose: Nose normal.     Mouth/Throat:     Mouth: Mucous membranes are moist.  Eyes:     Extraocular Movements: Extraocular movements intact.  Cardiovascular:     Rate and Rhythm: Normal rate.     Pulses: Normal pulses.  Pulmonary:     Effort: Pulmonary effort is normal.  Abdominal:     General: Abdomen is  flat. There is no distension.  Musculoskeletal:        General: Tenderness present.     Cervical back: Normal range of motion.     Comments: Pt is slow to rise from seated position to standing. Good lumbar range of motion. Strong distal strength without clonus, no pain upon palpation of greater trochanters. Pain noted with internal/external rotation of left hip. Sensation intact bilaterally. Ambulates with cane, gait slow and unsteady.  Skin:    General: Skin is warm and dry.     Capillary Refill: Capillary refill takes less than 2 seconds.  Neurological:     Mental Status: She is alert and oriented to person, place, and time.     Gait: Gait abnormal.  Psychiatric:        Mood and Affect: Mood normal.        Behavior: Behavior normal.    Ortho Exam  Imaging: No results found.  Past Medical/Family/Surgical/Social History: Medications & Allergies reviewed per EMR, new medications updated. Patient Active Problem List   Diagnosis Date Noted   Plantar fasciitis of right foot 09/03/2021   Pes planus 09/03/2021   Nuclear sclerotic cataract of both eyes 05/27/2021   Diabetes mellitus without complication ([place]) 20/94/7096   Vitreomacular adhesion of both eyes 05/27/2021   Antiphospholipid antibody positive 05/21/2021   Pain in left elbow 02/19/2021   Chronic left-sided low back pain with left-sided sciatica 07/22/2017   Thyroid nodule 04/16/2017   Essential hypertension 03/13/2014   Menometrorrhagia    Dysmenorrhea    Anemia    Hyperlipidemia 05/23/2007   OBESITY, MORBID 05/23/2007   DYSFUNCTIONAL UTERINE BLEEDING - s/p LAVH 05/23/2007   ROTATOR CUFF SYNDROME 05/23/2007   KNEE PAIN, HX OF 05/23/2007   Past Medical History:  Diagnosis Date   Anemia    Anxiety    Arthritis    Diabetes mellitus without complication ([place])    recent dx diet controlled   DVT (deep venous thrombosis) (HCC)    remote - took coumadin   Dysmenorrhea    Dysrhythmia    irregular heart beat   Fibroid     GERD (gastroesophageal reflux disease)    hx of   Gout    Hypercholesteremia    Hypertension 4 months   Menometrorrhagia    Morbid obesity ([place])    Prediabetes    Substance abuse ([place])    Family History  Problem Relation Age of Onset   Diabetes Mother    Hypertension Mother    Cancer Mother    Deep vein thrombosis Mother        and PE   Diabetes Father    Hypertension Father    Cancer Father 53   Throat cancer Brother 43   Pulmonary embolism Daughter    Heart disease Other        No family history   Past Surgical History:  Procedure Laterality Date   ABDOMINAL HYSTERECTOMY  2008   BREAST BIOPSY Left 2013   benign   BUNIONECTOMY Bilateral  CHOLECYSTECTOMY     COLONOSCOPY WITH PROPOFOL N/A 10/27/2018   Procedure: COLONOSCOPY WITH PROPOFOL;  Surgeon: Wonda Horner, MD;  Location: [facility];  Service: Endoscopy;  Laterality: N/A;   ECTOPIC PREGNANCY SURGERY  1980's   MANDIBLE OSTEOTOMY Bilateral 07/03/2013   Procedure: BILATERAL TORI;  Surgeon: Gae Bon, DDS;  Location: [place];  Service: Oral Surgery;  Laterality: Bilateral;   NM MYOCAR PERF WALL MOTION  07/2012   lexiscan - normal pattern of perfusion, low risk   ROTATOR CUFF REPAIR Bilateral 2009   SLEEP STUDY  07/27/2012   AHI 4.8/hr   TOOTH EXTRACTION N/A 07/03/2013   Procedure: DENTAL EXTRACTION # 20;  Surgeon: Gae Bon, DDS;  Location: [place];  Service: Oral Surgery;  Laterality: N/A;   TRANSTHORACIC ECHOCARDIOGRAM  07/2012   EF=>55%, mod conc LVH; LA mod dilated; trace MR; mild TR with normal RVSP; trace AV regurg   TUBAL LIGATION     Social History   Occupational History    Employer: UNEMPLOYED    Comment: Disability  Tobacco Use   Smoking status: Former    Packs/day: 0.10    Years: 6.00    Pack years: 0.60    Types: Cigarettes    Quit date: 02/14/2011    Years since quitting: 11.0   Smokeless tobacco: Never  Vaping Use   Vaping Use: Never used  Substance and Sexual Activity    Alcohol use: No    Comment: Previous ETOH abuse   Drug use: No    Comment: Previous crack use    Sexual activity: Never    Birth control/protection: Abstinence

## 2022-03-18 ENCOUNTER — Ambulatory Visit (INDEPENDENT_AMBULATORY_CARE_PROVIDER_SITE_OTHER): Payer: Medicaid Other

## 2022-03-18 ENCOUNTER — Ambulatory Visit: Payer: Medicaid Other | Admitting: Physician Assistant

## 2022-03-18 ENCOUNTER — Encounter: Payer: Self-pay | Admitting: Physician Assistant

## 2022-03-18 DIAGNOSIS — M25532 Pain in left wrist: Secondary | ICD-10-CM

## 2022-03-18 DIAGNOSIS — M79642 Pain in left hand: Secondary | ICD-10-CM

## 2022-03-18 DIAGNOSIS — G5603 Carpal tunnel syndrome, bilateral upper limbs: Secondary | ICD-10-CM

## 2022-03-18 NOTE — Progress Notes (Signed)
Office Visit Note   Patient: [name]           Date of Birth: [birth year]           MRN: [id number] Visit Date: 03/18/2022              Requested by: Riki Sheer, NP [place],  [place] [id number] PCP: Riki Sheer, NP  Left greater than right hand pain    HPI: Patient is a pleasant 61 year old woman with a long history of bilateral hand and wrist pain.  She denies any injuries and is right-handed.  She said she saw Dr. Durward Fortes many years ago for tendinitis in her thumbs.  I assume this was de Quervain's.  The pain she has been having is in her wrists and radiates into her hand and up her elbow.  She says that she has difficulty with her grip strength.  She has worn some braces in the past but they do not seem to help anymore.  She also says she does get some tingling in her fingers at times.  Assessment & Plan: Visit Diagnoses:  1. Pain in left wrist   2. Pain in left hand   3. Carpal tunnel syndrome, bilateral     Plan: I spoke with the patient and had some concerns that she may have carpal tunnel sister syndrome.  She said she was told this at one time but had not had any electrodiagnostic studies.  We will go forward with this today.  She will follow-up with Dr. Durward Fortes what she is done  Follow-Up Instructions:   Ortho Exam  Patient is alert, oriented, no adenopathy, well-dressed, normal affect, normal respiratory effort. Examination of her right.  Negative Tinel's sign.  Hand.  She has a palpable pulse brisk capillary refill.  She can oppose her thumb to all of her fingers and has fairly good strength.  On the left hand she does have some decreased strength with opposition of the thumb to the fifth finger.  Her sensation is intact today.  Negative Tinel's sign on the left side she was unable to maintain a Phalen's sign it was too painful to have her wrist flexed.  She had no erythema or redness.  No tenderness in either  elbow  Imaging: No results found. No images are attached to the encounter.  Labs: Lab Results  Component Value Date   HGBA1C 6.4 (H) 06/19/2013   HGBA1C 6.0 11/20/2009   ESRSEDRATE 19 04/18/2021   LABURIC 6.4 06/19/2013   REPTSTATUS 10/09/2012 FINAL 10/08/2012   CULT  10/08/2012    Multiple bacterial morphotypes present, none predominant. Suggest appropriate recollection if clinically indicated.     Lab Results  Component Value Date   ALBUMIN 4.1 12/08/2021   ALBUMIN 3.4 (L) 09/05/2021   ALBUMIN 3.9 05/20/2021    No results found for: MG No results found for: VD25OH  No results found for: PREALBUMIN    Latest Ref Rng & Units 12/08/2021    8:26 AM 09/05/2021    8:16 AM 05/20/2021    9:31 AM  CBC EXTENDED  WBC 4.0 - 10.5 K/uL 6.5   4.9   6.3    RBC 3.87 - 5.11 MIL/uL 4.20   3.94   4.08    Hemoglobin 12.0 - 15.0 g/dL 12.3   11.5   12.4    HCT 36.0 - 46.0 % 36.6   34.9   36.0    Platelets 150 - 400  K/uL 257   261   251    NEUT# 1.7 - 7.7 K/uL 2.9   2.0   3.2    Lymph# 0.7 - 4.0 K/uL 2.7   2.2   2.3       There is no height or weight on file to calculate BMI.  Orders:  Orders Placed This Encounter  Procedures   XR Wrist 2 Views Left   XR Hand Complete Left   Ambulatory referral to Physical Medicine Rehab   No orders of the defined types were placed in this encounter.    Procedures: No procedures performed  Clinical Data: No additional findings.  ROS:  All other systems negative, except as noted in the HPI. Review of Systems  Objective: Vital Signs: There were no vitals taken for this visit.  Specialty Comments:  MRI LUMBAR SPINE WITHOUT CONTRAST   TECHNIQUE: Multiplanar, multisequence MR imaging of the lumbar spine was performed. No intravenous contrast was administered.   COMPARISON:  Prior radiograph from a 02/02/2021 and MRI from 07/28/2017.   FINDINGS: Segmentation: Standard. Lowest well-formed disc space labeled the L5-S1 level.    Alignment: Exaggeration of the normal lumbar lordosis. Trace stepwise retrolisthesis of T12 on L1 and L1 on L2. Trace facet mediated anterolisthesis of L4 on L5.   Vertebrae: Vertebral body height maintained without acute or chronic fracture. Bone marrow signal intensity heterogeneous but overall within normal limits. No discrete or worrisome osseous lesions. No abnormal marrow edema.   Conus medullaris and cauda equina: Conus extends to the L1-2 level. Conus and cauda equina appear normal.   Paraspinal and other soft tissues: Paraspinous soft tissues within normal limits. Few small benign appearing cyst noted about the visualized kidneys. Visualized visceral structures otherwise unremarkable.   Disc levels:   T11-12: Mild disc bulge with disc desiccation. Left greater than right facet hypertrophy. No significant spinal stenosis. Mild left foraminal narrowing. Right neural foramen remains patent.   T12-L1: Mild disc bulge with disc desiccation. No spinal stenosis. Foramina remain patent.   L1-2: Intervertebral disc space narrowing with diffuse disc bulge and disc desiccation. Superimposed right foraminal to extraforaminal disc protrusion (series 6, image 19). No spinal stenosis. Mild right L1 foraminal narrowing. Left neural foramen remains patent.   L2-3: Negative interspace. Mild facet and ligament flavum hypertrophy. No canal or foraminal stenosis.   L3-4: Negative interspace. Mild to moderate facet and ligament flavum hypertrophy. No spinal stenosis. Mild left greater than right L3 foraminal narrowing.   L4-5: Trace anterolisthesis. Mild disc bulge with disc desiccation. Superimposed small right foraminal disc protrusion closely approximates and/or contacts the exiting right L4 nerve root (series 6, image 37). Advanced bilateral facet arthrosis with ligament flavum hypertrophy. Resultant mild canal with moderate bilateral subarticular stenosis. Moderate bilateral L4  foraminal narrowing, worse on the right.   L5-S1: Disc desiccation with diffuse disc bulge. Mild reactive endplate spurring. Moderate left worse than right facet hypertrophy. No significant spinal stenosis. Moderate left worse than right L5 foraminal stenosis.   IMPRESSION: 1. Multifactorial degenerative changes at L4-5 with resultant mild canal with moderate bilateral subarticular stenosis, with moderate bilateral L4 foraminal narrowing. Superimposed right foraminal disc protrusion at this level could potentially affect the exiting right L4 nerve root. 2. Disc bulging with facet hypertrophy at L5-S1 with resultant moderate left worse than right L5 foraminal stenosis. 3. Additional mild noncompressive disc bulging with facet hypertrophy at L1-2 and L2-3 without significant stenosis or neural impingement.     Electronically Signed  By: Jeannine Boga M.D.   On: 12/20/2021 03:21  PMFS History: Patient Active Problem List   Diagnosis Date Noted   Carpal tunnel syndrome, bilateral 03/18/2022   Plantar fasciitis of right foot 09/03/2021   Pes planus 09/03/2021   Nuclear sclerotic cataract of both eyes 05/27/2021   Diabetes mellitus without complication ([place]) 40/81/4481   Vitreomacular adhesion of both eyes 05/27/2021   Antiphospholipid antibody positive 05/21/2021   Pain in left elbow 02/19/2021   Chronic left-sided low back pain with left-sided sciatica 07/22/2017   Thyroid nodule 04/16/2017   Essential hypertension 03/13/2014   Menometrorrhagia    Dysmenorrhea    Anemia    Hyperlipidemia 05/23/2007   OBESITY, MORBID 05/23/2007   DYSFUNCTIONAL UTERINE BLEEDING - s/p LAVH 05/23/2007   ROTATOR CUFF SYNDROME 05/23/2007   KNEE PAIN, HX OF 05/23/2007   Past Medical History:  Diagnosis Date   Anemia    Anxiety    Arthritis    Diabetes mellitus without complication ([place])    recent dx diet controlled   DVT (deep venous thrombosis) (HCC)    remote - took coumadin    Dysmenorrhea    Dysrhythmia    irregular heart beat   Fibroid    GERD (gastroesophageal reflux disease)    hx of   Gout    Hypercholesteremia    Hypertension 4 months   Menometrorrhagia    Morbid obesity ([place])    Prediabetes    Substance abuse ([place])     Family History  Problem Relation Age of Onset   Diabetes Mother    Hypertension Mother    Cancer Mother    Deep vein thrombosis Mother        and PE   Diabetes Father    Hypertension Father    Cancer Father 38   Throat cancer Brother 64   Pulmonary embolism Daughter    Heart disease Other        No family history    Past Surgical History:  Procedure Laterality Date   ABDOMINAL HYSTERECTOMY  2008   BREAST BIOPSY Left 2013   benign   BUNIONECTOMY Bilateral    CHOLECYSTECTOMY     COLONOSCOPY WITH PROPOFOL N/A 10/27/2018   Procedure: COLONOSCOPY WITH PROPOFOL;  Surgeon: Wonda Horner, MD;  Location[facility];  Service: Endoscopy;  Laterality: N/A;   ECTOPIC PREGNANCY SURGERY  1980's   MANDIBLE OSTEOTOMY Bilateral 07/03/2013   Procedure: BILATERAL TORI;  Surgeon: Gae Bon, DDS;  Location: [facility];  Service: Oral Surgery;  Laterality: Bilateral;   NM MYOCAR PERF WALL MOTION  07/2012   lexiscan - normal pattern of perfusion, low risk   ROTATOR CUFF REPAIR Bilateral 2009   SLEEP STUDY  07/27/2012   AHI 4.8/hr   TOOTH EXTRACTION N/A 07/03/2013   Procedure: DENTAL EXTRACTION # 20;  Surgeon: Gae Bon, DDS;  Location: [facility];  Service: Oral Surgery;  Laterality: N/A;   TRANSTHORACIC ECHOCARDIOGRAM  07/2012   EF=>55%, mod conc LVH; LA mod dilated; trace MR; mild TR with normal RVSP; trace AV regurg   TUBAL LIGATION     Social History   Occupational History    Employer: UNEMPLOYED    Comment: Disability  Tobacco Use   Smoking status: Former    Packs/day: 0.10    Years: 6.00    Pack years: 0.60    Types: Cigarettes    Quit date: 02/14/2011    Years since quitting: 11.0   Smokeless tobacco: Never  Vaping  Use    Vaping Use: Never used  Substance and Sexual Activity   Alcohol use: No    Comment: Previous ETOH abuse   Drug use: No    Comment: Previous crack use    Sexual activity: Never    Birth control/protection: Abstinence

## 2022-04-21 ENCOUNTER — Ambulatory Visit: Payer: Medicaid Other | Admitting: Physical Medicine and Rehabilitation

## 2022-04-21 ENCOUNTER — Encounter: Payer: Self-pay | Admitting: Physical Medicine and Rehabilitation

## 2022-04-21 DIAGNOSIS — R202 Paresthesia of skin: Secondary | ICD-10-CM | POA: Diagnosis not present

## 2022-04-21 NOTE — Progress Notes (Signed)
Pt state both hand pain and numbness. Pt state she has numbness in both pink,ring and middle finger. Pt state she has drop things and it hard for her to holding and grip items. Pt state she takes pain meds to help with the pain. Pt state she right handed.  Numeric Pain Rating Scale and Functional Assessment Average Pain 4   In the last MONTH (on 0-10 scale) has pain interfered with the following?  1. General activity like being  able to carry out your everyday physical activities such as walking, climbing stairs, carrying groceries, or moving a chair?  Rating(10)   -BT, -Dye Allergies.

## 2022-04-24 ENCOUNTER — Encounter: Payer: Self-pay | Admitting: Physician Assistant

## 2022-04-24 ENCOUNTER — Ambulatory Visit: Payer: Medicaid Other | Admitting: Physician Assistant

## 2022-04-24 DIAGNOSIS — R202 Paresthesia of skin: Secondary | ICD-10-CM

## 2022-04-24 DIAGNOSIS — M25532 Pain in left wrist: Secondary | ICD-10-CM

## 2022-04-24 DIAGNOSIS — M79642 Pain in left hand: Secondary | ICD-10-CM

## 2022-04-24 MED ORDER — LIDOCAINE HCL 1 % IJ SOLN
0.5000 mL | INTRAMUSCULAR | Status: AC | PRN
  Administered 2022-04-24: .5 mL

## 2022-04-24 MED ORDER — METHYLPREDNISOLONE ACETATE 40 MG/ML IJ SUSP
20.0000 mg | INTRAMUSCULAR | Status: AC | PRN
  Administered 2022-04-24: 20 mg via INTRA_ARTICULAR

## 2022-04-24 NOTE — Procedures (Signed)
EMG & NCV Findings: Evaluation of the left median motor and the right median motor nerves showed decreased conduction velocity (Elbow-Wrist, L45, R45 m/s).  The left ulnar motor and the right ulnar motor nerves showed decreased conduction velocity (B Elbow-Wrist, L51, R50 m/s).  The left median (across palm) sensory and the right median (across palm) sensory nerves showed prolonged distal peak latency (Wrist, L4.4, R4.8 ms) and prolonged distal peak latency (Palm, L2.5, R2.9 ms).  The left ulnar sensory and the right ulnar sensory nerves showed prolonged distal peak latency (L4.1, R4.2 ms) and decreased conduction velocity (Wrist-5th Digit, L34, R33 m/s).  All remaining nerves (as indicated in the following tables) were within normal limits.  Left vs. Right side comparison data for the ulnar motor nerve indicates abnormal L-R velocity difference (A Elbow-B Elbow, 21 m/s).  All remaining left vs. right side differences were within normal limits.    All examined muscles (as indicated in the following table) showed no evidence of electrical instability.    Impression: The above electrodiagnostic study is ABNORMAL and reveals evidence most consistent with  peripheral neuropathy of bilateral upper extremities.   Recommendations: 1.  Follow-up with referring physician. 2.  Continue current management of symptoms.  ___________________________ Laurence Spates FAAPMR Board Certified, American Board of Physical Medicine and Rehabilitation    Nerve Conduction Studies Anti Sensory Summary Table   Stim Site NR Peak (ms) Norm Peak (ms) P-T Amp (V) Norm P-T Amp Site1 Site2 Delta-P (ms) Dist (cm) Vel (m/s) Norm Vel (m/s)  Left Median Acr Palm Anti Sensory (2nd Digit)  29.9C  Wrist    *4.4 <3.6 41.1 >10 Wrist Palm 1.9 0.0    Palm    *2.5 <2.0 40.7         Right Median Acr Palm Anti Sensory (2nd Digit)  29.3C  Wrist    *4.8 <3.6 29.5 >10 Wrist Palm 1.9 0.0    Palm    *2.9 <2.0 17.3         Left Radial Anti  Sensory (Base 1st Digit)  29.7C  Wrist    2.5 <3.1 38.8  Wrist Base 1st Digit 2.5 0.0    Right Radial Anti Sensory (Base 1st Digit)  30C  Wrist    2.7 <3.1 26.8  Wrist Base 1st Digit 2.7 0.0    Left Ulnar Anti Sensory (5th Digit)  29.9C  Wrist    *4.1 <3.7 23.3 >15.0 Wrist 5th Digit 4.1 14.0 *34 >38  Right Ulnar Anti Sensory (5th Digit)  29.4C  Wrist    *4.2 <3.7 29.1 >15.0 Wrist 5th Digit 4.2 14.0 *33 >38   Motor Summary Table   Stim Site NR Onset (ms) Norm Onset (ms) O-P Amp (mV) Norm O-P Amp Site1 Site2 Delta-0 (ms) Dist (cm) Vel (m/s) Norm Vel (m/s)  Left Median Motor (Abd Poll Brev)  29.7C  Wrist    3.8 <4.2 9.3 >5 Elbow Wrist 4.7 21.0 *45 >50  Elbow    8.5  8.6         Right Median Motor (Abd Poll Brev)  30.3C  Wrist    4.1 <4.2 7.9 >5 Elbow Wrist 4.9 22.0 *45 >50  Elbow    9.0  7.4         Left Ulnar Motor (Abd Dig Min)  29.5C  Wrist    3.3 <4.2 9.2 >3 B Elbow Wrist 4.1 21.0 *51 >53  B Elbow    7.4  10.0  A Elbow B Elbow 1.4 11.0 79 >  53  A Elbow    8.8  9.1         Right Ulnar Motor (Abd Dig Min)  30C  Wrist    3.1 <4.2 10.9 >3 B Elbow Wrist 4.6 23.0 *50 >53  B Elbow    7.7  10.6  A Elbow B Elbow 1.1 11.0 100 >53  A Elbow    8.8  11.8          EMG   Side Muscle Nerve Root Ins Act Fibs Psw Amp Dur Poly Recrt Int Fraser Din Comment  Right Abd Poll Brev Median C8-T1 Nml Nml Nml Nml Nml 0 Nml Nml   Right 1stDorInt Ulnar C8-T1 Nml Nml Nml Nml Nml 0 Nml Nml   Right PronatorTeres Median C6-7 Nml Nml Nml Nml Nml 0 Nml Nml   Right Biceps Musculocut C5-6 Nml Nml Nml Nml Nml 0 Nml Nml   Right Deltoid Axillary C5-6 Nml Nml Nml Nml Nml 0 Nml Nml     Nerve Conduction Studies Anti Sensory Left/Right Comparison   Stim Site L Lat (ms) R Lat (ms) L-R Lat (ms) L Amp (V) R Amp (V) L-R Amp (%) Site1 Site2 L Vel (m/s) R Vel (m/s) L-R Vel (m/s)  Median Acr Palm Anti Sensory (2nd Digit)  29.9C  Wrist *4.4 *4.8 0.4 41.1 29.5 28.2 Wrist Palm     Palm *2.5 *2.9 0.4 40.7 17.3 57.5        Radial Anti Sensory (Base 1st Digit)  29.7C  Wrist 2.5 2.7 0.2 38.8 26.8 30.9 Wrist Base 1st Digit     Ulnar Anti Sensory (5th Digit)  29.9C  Wrist *4.1 *4.2 0.1 23.3 29.1 19.9 Wrist 5th Digit *34 *33 1   Motor Left/Right Comparison   Stim Site L Lat (ms) R Lat (ms) L-R Lat (ms) L Amp (mV) R Amp (mV) L-R Amp (%) Site1 Site2 L Vel (m/s) R Vel (m/s) L-R Vel (m/s)  Median Motor (Abd Poll Brev)  29.7C  Wrist 3.8 4.1 0.3 9.3 7.9 15.1 Elbow Wrist *45 *45 0  Elbow 8.5 9.0 0.5 8.6 7.4 14.0       Ulnar Motor (Abd Dig Min)  29.5C  Wrist 3.3 3.1 0.2 9.2 10.9 15.6 B Elbow Wrist *51 *50 1  B Elbow 7.4 7.7 0.3 10.0 10.6 5.7 A Elbow B Elbow 79 100 *21  A Elbow 8.8 8.8 0.0 9.1 11.8 22.9          Waveforms:

## 2022-04-24 NOTE — Progress Notes (Signed)
Office Visit Note   Patient: [name]           Date of Birth: 1960/10/19           MRN: [id number] Visit Date: 04/24/2022              Requested by: Riki Sheer, NP [place],  [place] [address] PCP: Riki Sheer, NP  Chief Complaint  Patient presents with  . Left Wrist - Follow-up  . Right Wrist - Follow-up      HPI: Patient is a pleasant 61 year old woman who follows up today for her electrodiagnostic studies of her bilateral upper extremities.  She was having left greater than right wrist and hand numbness.  Without injury.  She is a diabetic.  She is also complaining of pain more significant in the left wrist.  She has had de Quervain's injections in the past which have been helpful  Assessment & Plan: Visit Diagnoses:  1. Paresthesia of skin   2. Pain in left wrist   3. Pain in left hand     Plan: I will go forward today and inject her first dorsal compartment on the left wrist.  She seems to have a lot of pain in this area..  She does have bilateral upper extremity abnormal electrodiagnostic studies for peripheral neuropathy.  Certainly this could be secondary to her diabetes she is diet controlled.  Would like her to follow-up with a neurologist for consultation we will follow-up with [place] in 3-week if the injection helped her she may cancel this appointment  Follow-Up Instructions:   Ortho Exam  Patient is alert, oriented, no adenopathy, well-dressed, normal affect, normal respiratory effort. Examination of her left wrist no redness no erythema she is tender over the first dorsal compartment but also with extension and flexion of the wrist.  Imaging: No results found. No images are attached to the encounter.  Labs: Lab Results  Component Value Date   HGBA1C 6.4 (H) 06/19/2013   HGBA1C 6.0 11/20/2009   ESRSEDRATE 19 04/18/2021   LABURIC 6.4 06/19/2013   REPTSTATUS 10/09/2012 FINAL 10/08/2012   CULT  10/08/2012    Multiple  bacterial morphotypes present, none predominant. Suggest appropriate recollection if clinically indicated.     Lab Results  Component Value Date   ALBUMIN 4.1 12/08/2021   ALBUMIN 3.4 (L) 09/05/2021   ALBUMIN 3.9 05/20/2021    No results found for: "MG" No results found for: "VD25OH"  No results found for: "PREALBUMIN"    Latest Ref Rng & Units 12/08/2021    8:26 AM 09/05/2021    8:16 AM 05/20/2021    9:31 AM  CBC EXTENDED  WBC 4.0 - 10.5 K/uL 6.5  4.9  6.3   RBC 3.87 - 5.11 MIL/uL 4.20  3.94  4.08   Hemoglobin 12.0 - 15.0 g/dL 12.3  11.5  12.4   HCT 36.0 - 46.0 % 36.6  34.9  36.0   Platelets 150 - 400 K/uL 257  261  251   NEUT# 1.7 - 7.7 K/uL 2.9  2.0  3.2   Lymph# 0.7 - 4.0 K/uL 2.7  2.2  2.3      There is no height or weight on file to calculate BMI.  Orders:  Orders Placed This Encounter  Procedures  . Ambulatory referral to Neurology   No orders of the defined types were placed in this encounter.    Procedures: Medium Joint Inj on 04/24/2022 11:26 AM Indications:  pain and diagnostic evaluation Details: 25 G 1.5 in needle, dorsal approach Medications: 0.5 mL lidocaine 1 %; 20 mg methylPREDNISolone acetate 40 MG/ML Outcome: tolerated well, no immediate complications  After obtaining verbal consent the area of the first dorsal compartment was prepped with alcohol and Betadine.  And successful injection Band-Aid was placed.  Good radial pulse fingers are warm pink following injection patient seemed to have some relief Procedure, treatment alternatives, risks and benefits explained, specific risks discussed. Consent was given by the patient. Immediately prior to procedure a time out was called to verify the correct patient, procedure, equipment, support staff and site/side marked as required. Patient was prepped and draped in the usual sterile fashion.    Clinical Data: No additional findings.  ROS:  All other systems negative, except as noted in the HPI. Review  of Systems  Objective: Vital Signs: There were no vitals taken for this visit.  Specialty Comments:  MRI LUMBAR SPINE WITHOUT CONTRAST   TECHNIQUE: Multiplanar, multisequence MR imaging of the lumbar spine was performed. No intravenous contrast was administered.   COMPARISON:  Prior radiograph from a 02/02/2021 and MRI from 07/28/2017.   FINDINGS: Segmentation: Standard. Lowest well-formed disc space labeled the L5-S1 level.   Alignment: Exaggeration of the normal lumbar lordosis. Trace stepwise retrolisthesis of T12 on L1 and L1 on L2. Trace facet mediated anterolisthesis of L4 on L5.   Vertebrae: Vertebral body height maintained without acute or chronic fracture. Bone marrow signal intensity heterogeneous but overall within normal limits. No discrete or worrisome osseous lesions. No abnormal marrow edema.   Conus medullaris and cauda equina: Conus extends to the L1-2 level. Conus and cauda equina appear normal.   Paraspinal and other soft tissues: Paraspinous soft tissues within normal limits. Few small benign appearing cyst noted about the visualized kidneys. Visualized visceral structures otherwise unremarkable.   Disc levels:   T11-12: Mild disc bulge with disc desiccation. Left greater than right facet hypertrophy. No significant spinal stenosis. Mild left foraminal narrowing. Right neural foramen remains patent.   T12-L1: Mild disc bulge with disc desiccation. No spinal stenosis. Foramina remain patent.   L1-2: Intervertebral disc space narrowing with diffuse disc bulge and disc desiccation. Superimposed right foraminal to extraforaminal disc protrusion (series 6, image 19). No spinal stenosis. Mild right L1 foraminal narrowing. Left neural foramen remains patent.   L2-3: Negative interspace. Mild facet and ligament flavum hypertrophy. No canal or foraminal stenosis.   L3-4: Negative interspace. Mild to moderate facet and ligament flavum hypertrophy. No  spinal stenosis. Mild left greater than right L3 foraminal narrowing.   L4-5: Trace anterolisthesis. Mild disc bulge with disc desiccation. Superimposed small right foraminal disc protrusion closely approximates and/or contacts the exiting right L4 nerve root (series 6, image 37). Advanced bilateral facet arthrosis with ligament flavum hypertrophy. Resultant mild canal with moderate bilateral subarticular stenosis. Moderate bilateral L4 foraminal narrowing, worse on the right.   L5-S1: Disc desiccation with diffuse disc bulge. Mild reactive endplate spurring. Moderate left worse than right facet hypertrophy. No significant spinal stenosis. Moderate left worse than right L5 foraminal stenosis.   IMPRESSION: 1. Multifactorial degenerative changes at L4-5 with resultant mild canal with moderate bilateral subarticular stenosis, with moderate bilateral L4 foraminal narrowing. Superimposed right foraminal disc protrusion at this level could potentially affect the exiting right L4 nerve root. 2. Disc bulging with facet hypertrophy at L5-S1 with resultant moderate left worse than right L5 foraminal stenosis. 3. Additional mild noncompressive disc bulging with facet hypertrophy at  L1-2 and L2-3 without significant stenosis or neural impingement.     Electronically Signed   By: Jeannine Boga M.D.   On: 12/20/2021 03:21  PMFS History: Patient Active Problem List   Diagnosis Date Noted  . Carpal tunnel syndrome, bilateral 03/18/2022  . Plantar fasciitis of right foot 09/03/2021  . Pes planus 09/03/2021  . Nuclear sclerotic cataract of both eyes 05/27/2021  . Diabetes mellitus without complication ([facility]) 00/17/4944  . Vitreomacular adhesion of both eyes 05/27/2021  . Antiphospholipid antibody positive 05/21/2021  . Pain in left elbow 02/19/2021  . Chronic left-sided low back pain with left-sided sciatica 07/22/2017  . Thyroid nodule 04/16/2017  . Essential hypertension  03/13/2014  . Menometrorrhagia   . Dysmenorrhea   . Anemia   . Hyperlipidemia 05/23/2007  . OBESITY, MORBID 05/23/2007  . DYSFUNCTIONAL UTERINE BLEEDING - s/p LAVH 05/23/2007  . ROTATOR CUFF SYNDROME 05/23/2007  . KNEE PAIN, HX OF 05/23/2007   Past Medical History:  Diagnosis Date  . Anemia   . Anxiety   . Arthritis   . Diabetes mellitus without complication ([facility])    recent dx diet controlled  . DVT (deep venous thrombosis) ([facility])    remote - took coumadin  . Dysmenorrhea   . Dysrhythmia    irregular heart beat  . Fibroid   . GERD (gastroesophageal reflux disease)    hx of  . Gout   . Hypercholesteremia   . Hypertension 4 months  . Menometrorrhagia   . Morbid obesity ([facility])   . Prediabetes   . Substance abuse ([facility])     Family History  Problem Relation Age of Onset  . Diabetes Mother   . Hypertension Mother   . Cancer Mother   . Deep vein thrombosis Mother        and PE  . Diabetes Father   . Hypertension Father   . Cancer Father 43  . Throat cancer Brother 12  . Pulmonary embolism Daughter   . Heart disease Other        No family history    Past Surgical History:  Procedure Laterality Date  . ABDOMINAL HYSTERECTOMY  2008  . BREAST BIOPSY Left 2013   benign  . BUNIONECTOMY Bilateral   . CHOLECYSTECTOMY    . COLONOSCOPY WITH PROPOFOL N/A 10/27/2018   Procedure: COLONOSCOPY WITH PROPOFOL;  Surgeon: Wonda Horner, MD;  Location: [facility];  Service: Endoscopy;  Laterality: N/A;  . ECTOPIC PREGNANCY SURGERY  1980's  . MANDIBLE OSTEOTOMY Bilateral 07/03/2013   Procedure: BILATERAL TORI;  Surgeon: Gae Bon, DDS;  Location: [facility];  Service: Oral Surgery;  Laterality: Bilateral;  . NM MYOCAR PERF WALL MOTION  07/2012   lexiscan - normal pattern of perfusion, low risk  . ROTATOR CUFF REPAIR Bilateral 2009  . SLEEP STUDY  07/27/2012   AHI 4.8/hr  . TOOTH EXTRACTION N/A 07/03/2013   Procedure: DENTAL EXTRACTION # 20;  Surgeon: Gae Bon, DDS;  Location:  [facility];  Service: Oral Surgery;  Laterality: N/A;  . TRANSTHORACIC ECHOCARDIOGRAM  07/2012   EF=>55%, mod conc LVH; LA mod dilated; trace MR; mild TR with normal RVSP; trace AV regurg  . TUBAL LIGATION     Social History   Occupational History    Employer: UNEMPLOYED    Comment: Disability  Tobacco Use  . Smoking status: Former    Packs/day: 0.10    Years: 6.00    Total pack years: 0.60    Types: Cigarettes  Quit date: 02/14/2011    Years since quitting: 11.1  . Smokeless tobacco: Never  Vaping Use  . Vaping Use: Never used  Substance and Sexual Activity  . Alcohol use: No    Comment: Previous ETOH abuse  . Drug use: No    Comment: Previous crack use   . Sexual activity: Never    Birth control/protection: Abstinence

## 2022-05-01 NOTE — Progress Notes (Signed)
[name] - 61 y.o. female MRN [id number]  Date of birth: 1961-07-03  Office Visit Note: Visit Date: 04/21/2022 PCP: Riki Sheer, NP Referred by: Riki Sheer, NP  Subjective: Chief Complaint  Patient presents with   Right Hand - Numbness, Pain   Left Hand - Numbness, Pain   HPI:  [name] is a 61 y.o. female who comes in today at the request of Long, PA-C for electrodiagnostic study of the Bilateral upper extremities.  Patient is Right hand dominant.  She reports chronic worsening severe hand pain with numbness and tingling particularly in the middle ring and fifth digits on both hands in a nondermatomal fashion.  She has no frank radicular symptoms.  No prior electrodiagnostic studies that we can review.  She does have a history of diabetes.   ROS Otherwise per HPI.  Assessment & Plan: Visit Diagnoses:    ICD-10-CM   1. Paresthesia of skin  R20.2 NCV with EMG (electromyography)      Plan: Impression: The above electrodiagnostic study is ABNORMAL and reveals evidence most consistent with  peripheral neuropathy of bilateral upper extremities.   Recommendations: 1.  Follow-up with referring physician. 2.  Continue current management of symptoms.  Meds & Orders: No orders of the defined types were placed in this encounter.   Orders Placed This Encounter  Procedures   NCV with EMG (electromyography)    Follow-up: Return in about 2 weeks (around 05/05/2022) for [place], PA-C.   Procedures: No procedures performed  EMG & NCV Findings: Evaluation of the left median motor and the right median motor nerves showed decreased conduction velocity (Elbow-Wrist, L45, R45 m/s).  The left ulnar motor and the right ulnar motor nerves showed decreased conduction velocity (B Elbow-Wrist, L51, R50 m/s).  The left median (across palm) sensory and the right median (across palm) sensory nerves showed prolonged distal peak latency  (Wrist, L4.4, R4.8 ms) and prolonged distal peak latency (Palm, L2.5, R2.9 ms).  The left ulnar sensory and the right ulnar sensory nerves showed prolonged distal peak latency (L4.1, R4.2 ms) and decreased conduction velocity (Wrist-5th Digit, L34, R33 m/s).  All remaining nerves (as indicated in the following tables) were within normal limits.  Left vs. Right side comparison data for the ulnar motor nerve indicates abnormal L-R velocity difference (A Elbow-B Elbow, 21 m/s).  All remaining left vs. right side differences were within normal limits.    All examined muscles (as indicated in the following table) showed no evidence of electrical instability.    Impression: The above electrodiagnostic study is ABNORMAL and reveals evidence most consistent with  peripheral neuropathy of bilateral upper extremities.   Recommendations: 1.  Follow-up with referring physician. 2.  Continue current management of symptoms.  ___________________________ Laurence Spates FAAPMR Board Certified, American Board of Physical Medicine and Rehabilitation    Nerve Conduction Studies Anti Sensory Summary Table   Stim Site NR Peak (ms) Norm Peak (ms) P-T Amp (V) Norm P-T Amp Site1 Site2 Delta-P (ms) Dist (cm) Vel (m/s) Norm Vel (m/s)  Left Median Acr Palm Anti Sensory (2nd Digit)  29.9C  Wrist    *4.4 <3.6 41.1 >10 Wrist Palm 1.9 0.0    Palm    *2.5 <2.0 40.7         Right Median Acr Palm Anti Sensory (2nd Digit)  29.3C  Wrist    *4.8 <3.6 29.5 >10 Wrist Palm 1.9 0.0    Palm    *  2.9 <2.0 17.3         Left Radial Anti Sensory (Base 1st Digit)  29.7C  Wrist    2.5 <3.1 38.8  Wrist Base 1st Digit 2.5 0.0    Right Radial Anti Sensory (Base 1st Digit)  30C  Wrist    2.7 <3.1 26.8  Wrist Base 1st Digit 2.7 0.0    Left Ulnar Anti Sensory (5th Digit)  29.9C  Wrist    *4.1 <3.7 23.3 >15.0 Wrist 5th Digit 4.1 14.0 *34 >38  Right Ulnar Anti Sensory (5th Digit)  29.4C  Wrist    *4.2 <3.7 29.1 >15.0 Wrist 5th Digit  4.2 14.0 *33 >38   Motor Summary Table   Stim Site NR Onset (ms) Norm Onset (ms) O-P Amp (mV) Norm O-P Amp Site1 Site2 Delta-0 (ms) Dist (cm) Vel (m/s) Norm Vel (m/s)  Left Median Motor (Abd Poll Brev)  29.7C  Wrist    3.8 <4.2 9.3 >5 Elbow Wrist 4.7 21.0 *45 >50  Elbow    8.5  8.6         Right Median Motor (Abd Poll Brev)  30.3C  Wrist    4.1 <4.2 7.9 >5 Elbow Wrist 4.9 22.0 *45 >50  Elbow    9.0  7.4         Left Ulnar Motor (Abd Dig Min)  29.5C  Wrist    3.3 <4.2 9.2 >3 B Elbow Wrist 4.1 21.0 *51 >53  B Elbow    7.4  10.0  A Elbow B Elbow 1.4 11.0 79 >53  A Elbow    8.8  9.1         Right Ulnar Motor (Abd Dig Min)  30C  Wrist    3.1 <4.2 10.9 >3 B Elbow Wrist 4.6 23.0 *50 >53  B Elbow    7.7  10.6  A Elbow B Elbow 1.1 11.0 100 >53  A Elbow    8.8  11.8          EMG   Side Muscle Nerve Root Ins Act Fibs Psw Amp Dur Poly Recrt Int Fraser Din Comment  Right Abd Poll Brev Median C8-T1 Nml Nml Nml Nml Nml 0 Nml Nml   Right 1stDorInt Ulnar C8-T1 Nml Nml Nml Nml Nml 0 Nml Nml   Right PronatorTeres Median C6-7 Nml Nml Nml Nml Nml 0 Nml Nml   Right Biceps Musculocut C5-6 Nml Nml Nml Nml Nml 0 Nml Nml   Right Deltoid Axillary C5-6 Nml Nml Nml Nml Nml 0 Nml Nml     Nerve Conduction Studies Anti Sensory Left/Right Comparison   Stim Site L Lat (ms) R Lat (ms) L-R Lat (ms) L Amp (V) R Amp (V) L-R Amp (%) Site1 Site2 L Vel (m/s) R Vel (m/s) L-R Vel (m/s)  Median Acr Palm Anti Sensory (2nd Digit)  29.9C  Wrist *4.4 *4.8 0.4 41.1 29.5 28.2 Wrist Palm     Palm *2.5 *2.9 0.4 40.7 17.3 57.5       Radial Anti Sensory (Base 1st Digit)  29.7C  Wrist 2.5 2.7 0.2 38.8 26.8 30.9 Wrist Base 1st Digit     Ulnar Anti Sensory (5th Digit)  29.9C  Wrist *4.1 *4.2 0.1 23.3 29.1 19.9 Wrist 5th Digit *34 *33 1   Motor Left/Right Comparison   Stim Site L Lat (ms) R Lat (ms) L-R Lat (ms) L Amp (mV) R Amp (mV) L-R Amp (%) Site1 Site2 L Vel (m/s) R Vel (m/s) L-R Vel (m/s)  Median  Motor (Abd Poll Brev)   29.7C  Wrist 3.8 4.1 0.3 9.3 7.9 15.1 Elbow Wrist *45 *45 0  Elbow 8.5 9.0 0.5 8.6 7.4 14.0       Ulnar Motor (Abd Dig Min)  29.5C  Wrist 3.3 3.1 0.2 9.2 10.9 15.6 B Elbow Wrist *51 *50 1  B Elbow 7.4 7.7 0.3 10.0 10.6 5.7 A Elbow B Elbow 79 100 *21  A Elbow 8.8 8.8 0.0 9.1 11.8 22.9          Waveforms:                      Clinical History: MRI LUMBAR SPINE WITHOUT CONTRAST   TECHNIQUE: Multiplanar, multisequence MR imaging of the lumbar spine was performed. No intravenous contrast was administered.   COMPARISON:  Prior radiograph from a 02/02/2021 and MRI from 07/28/2017.   FINDINGS: Segmentation: Standard. Lowest well-formed disc space labeled the L5-S1 level.   Alignment: Exaggeration of the normal lumbar lordosis. Trace stepwise retrolisthesis of T12 on L1 and L1 on L2. Trace facet mediated anterolisthesis of L4 on L5.   Vertebrae: Vertebral body height maintained without acute or chronic fracture. Bone marrow signal intensity heterogeneous but overall within normal limits. No discrete or worrisome osseous lesions. No abnormal marrow edema.   Conus medullaris and cauda equina: Conus extends to the L1-2 level. Conus and cauda equina appear normal.   Paraspinal and other soft tissues: Paraspinous soft tissues within normal limits. Few small benign appearing cyst noted about the visualized kidneys. Visualized visceral structures otherwise unremarkable.   Disc levels:   T11-12: Mild disc bulge with disc desiccation. Left greater than right facet hypertrophy. No significant spinal stenosis. Mild left foraminal narrowing. Right neural foramen remains patent.   T12-L1: Mild disc bulge with disc desiccation. No spinal stenosis. Foramina remain patent.   L1-2: Intervertebral disc space narrowing with diffuse disc bulge and disc desiccation. Superimposed right foraminal to extraforaminal disc protrusion (series 6, image 19). No spinal stenosis. Mild  right L1 foraminal narrowing. Left neural foramen remains patent.   L2-3: Negative interspace. Mild facet and ligament flavum hypertrophy. No canal or foraminal stenosis.   L3-4: Negative interspace. Mild to moderate facet and ligament flavum hypertrophy. No spinal stenosis. Mild left greater than right L3 foraminal narrowing.   L4-5: Trace anterolisthesis. Mild disc bulge with disc desiccation. Superimposed small right foraminal disc protrusion closely approximates and/or contacts the exiting right L4 nerve root (series 6, image 37). Advanced bilateral facet arthrosis with ligament flavum hypertrophy. Resultant mild canal with moderate bilateral subarticular stenosis. Moderate bilateral L4 foraminal narrowing, worse on the right.   L5-S1: Disc desiccation with diffuse disc bulge. Mild reactive endplate spurring. Moderate left worse than right facet hypertrophy. No significant spinal stenosis. Moderate left worse than right L5 foraminal stenosis.   IMPRESSION: 1. Multifactorial degenerative changes at L4-5 with resultant mild canal with moderate bilateral subarticular stenosis, with moderate bilateral L4 foraminal narrowing. Superimposed right foraminal disc protrusion at this level could potentially affect the exiting right L4 nerve root. 2. Disc bulging with facet hypertrophy at L5-S1 with resultant moderate left worse than right L5 foraminal stenosis. 3. Additional mild noncompressive disc bulging with facet hypertrophy at L1-2 and L2-3 without significant stenosis or neural impingement.     Electronically Signed   By: Jeannine Boga M.D.   On: 12/20/2021 03:21     Objective:  VS:  HT:    WT:   BMI:     BP:  HR: bpm  TEMP: ( )  RESP:  Physical Exam Musculoskeletal:        General: No swelling, tenderness or deformity.     Comments: Inspection reveals no atrophy of the bilateral APB or FDI or hand intrinsics. There is no swelling, color changes, allodynia  or dystrophic changes. There is 5 out of 5 strength in the bilateral wrist extension, finger abduction and long finger flexion. There is intact sensation to light touch in all dermatomal and peripheral nerve distributions. There is a negative Phalen's test bilaterally. There is a negative Hoffmann's test bilaterally.  Skin:    General: Skin is warm and dry.     Findings: No erythema or rash.  Neurological:     General: No focal deficit present.     Mental Status: She is alert and oriented to person, place, and time.     Motor: No weakness or abnormal muscle tone.     Coordination: Coordination normal.  Psychiatric:        Mood and Affect: Mood normal.        Behavior: Behavior normal.      Imaging: No results found.

## 2022-05-13 ENCOUNTER — Ambulatory Visit: Payer: Medicaid Other | Admitting: Orthopaedic Surgery

## 2022-05-13 ENCOUNTER — Encounter: Payer: Self-pay | Admitting: Orthopaedic Surgery

## 2022-05-13 DIAGNOSIS — M25532 Pain in left wrist: Secondary | ICD-10-CM

## 2022-05-13 DIAGNOSIS — G629 Polyneuropathy, unspecified: Secondary | ICD-10-CM | POA: Insufficient documentation

## 2022-05-13 DIAGNOSIS — G63 Polyneuropathy in diseases classified elsewhere: Secondary | ICD-10-CM | POA: Diagnosis not present

## 2022-05-13 NOTE — Progress Notes (Signed)
Office Visit Note   Patient: [name]           Date of Birth: 1961/01/27           MRN: [id number] Visit Date: 05/13/2022              Requested by: Riki Sheer, NP [address] [place],  [place] [address] PCP: Riki Sheer, NP   Assessment & Plan: Visit Diagnoses:  1. Pain in left wrist   2. Polyneuropathy associated with underlying disease [facility])     Plan: [name] continues to have a problem with her left wrist.  Dr. Ernestina Patches performed EMGs and nerve conduction studies consistent with a polyneuropathy.  She is diabetic but her recent hemoglobin A1c was less than 6 and she is now off of her metformin.  At 1 point she was taking gabapentin for burning and tingling in both of her feet but subsequently has discontinued the medicine.  She is having more trouble with her left than the right wrist and hand and does work a wrist splint.  Most of her pain seems to be at the radiocarpal joint.  There is some discomfort at the base of the thumb.  She had good capillary refill.  We will order an MRI scan of the wrist to evaluate further pathology  Follow-Up Instructions: Return After MRI scan left wrist.   Orders:  Orders Placed This Encounter  Procedures   MR Wrist Left w/o contrast   No orders of the defined types were placed in this encounter.     Procedures: No procedures performed   Clinical Data: No additional findings.   Subjective: Chief Complaint  Patient presents with   Left Wrist - Follow-up   Patient presents today for follow up of her left wrist pain. She states that she received an injection on 04-24-2022 and she states that she felt no relief of pain. She is currently wearing a wrist brace on her left wrist. Patient is currently using a cane which she uses with her right wrist. Oxycodone is being used and was prescribed through pain management.  Has a history of osteoarthritis right hip and being evaluated for possible hip replacement  after weight loss.  She is lost about 25 pounds  Review of Systems   Objective: Vital Signs: There were no vitals taken for this visit.  Physical Exam Constitutional:      Appearance: She is well-developed.  Eyes:     Pupils: Pupils are equal, round, and reactive to light.  Pulmonary:     Effort: Pulmonary effort is normal.  Skin:    General: Skin is warm and dry.  Neurological:     Mental Status: She is alert and oriented to person, place, and time.  Psychiatric:        Behavior: Behavior normal.     Ortho Exam left wrist with some discomfort is at the radiocarpal joint mostly dorsally with dorsiflexion and volar flexion of the wrist.  She did have a little discomfort over the median nerve but good capillary refill to the fingers and good opposition of thumb the little finger.  Some pain at the base of the thumb.  No evidence of de Quervain's today  Specialty Comments:  MRI LUMBAR SPINE WITHOUT CONTRAST   TECHNIQUE: Multiplanar, multisequence MR imaging of the lumbar spine was performed. No intravenous contrast was administered.   COMPARISON:  Prior radiograph from a 02/02/2021 and MRI from 07/28/2017.   FINDINGS: Segmentation:  Standard. Lowest well-formed disc space labeled the L5-S1 level.   Alignment: Exaggeration of the normal lumbar lordosis. Trace stepwise retrolisthesis of T12 on L1 and L1 on L2. Trace facet mediated anterolisthesis of L4 on L5.   Vertebrae: Vertebral body height maintained without acute or chronic fracture. Bone marrow signal intensity heterogeneous but overall within normal limits. No discrete or worrisome osseous lesions. No abnormal marrow edema.   Conus medullaris and cauda equina: Conus extends to the L1-2 level. Conus and cauda equina appear normal.   Paraspinal and other soft tissues: Paraspinous soft tissues within normal limits. Few small benign appearing cyst noted about the visualized kidneys. Visualized visceral structures  otherwise unremarkable.   Disc levels:   T11-12: Mild disc bulge with disc desiccation. Left greater than right facet hypertrophy. No significant spinal stenosis. Mild left foraminal narrowing. Right neural foramen remains patent.   T12-L1: Mild disc bulge with disc desiccation. No spinal stenosis. Foramina remain patent.   L1-2: Intervertebral disc space narrowing with diffuse disc bulge and disc desiccation. Superimposed right foraminal to extraforaminal disc protrusion (series 6, image 19). No spinal stenosis. Mild right L1 foraminal narrowing. Left neural foramen remains patent.   L2-3: Negative interspace. Mild facet and ligament flavum hypertrophy. No canal or foraminal stenosis.   L3-4: Negative interspace. Mild to moderate facet and ligament flavum hypertrophy. No spinal stenosis. Mild left greater than right L3 foraminal narrowing.   L4-5: Trace anterolisthesis. Mild disc bulge with disc desiccation. Superimposed small right foraminal disc protrusion closely approximates and/or contacts the exiting right L4 nerve root (series 6, image 37). Advanced bilateral facet arthrosis with ligament flavum hypertrophy. Resultant mild canal with moderate bilateral subarticular stenosis. Moderate bilateral L4 foraminal narrowing, worse on the right.   L5-S1: Disc desiccation with diffuse disc bulge. Mild reactive endplate spurring. Moderate left worse than right facet hypertrophy. No significant spinal stenosis. Moderate left worse than right L5 foraminal stenosis.   IMPRESSION: 1. Multifactorial degenerative changes at L4-5 with resultant mild canal with moderate bilateral subarticular stenosis, with moderate bilateral L4 foraminal narrowing. Superimposed right foraminal disc protrusion at this level could potentially affect the exiting right L4 nerve root. 2. Disc bulging with facet hypertrophy at L5-S1 with resultant moderate left worse than right L5 foraminal stenosis. 3.  Additional mild noncompressive disc bulging with facet hypertrophy at L1-2 and L2-3 without significant stenosis or neural impingement.     Electronically Signed   By: Jeannine Boga M.D.   On: 12/20/2021 03:21  Imaging: No results found.   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   Antiphospholipid antibody positive 05/21/2021   Pain in left elbow 02/19/2021   Chronic left-sided low back pain with left-sided sciatica 07/22/2017   Thyroid nodule 04/16/2017   Essential hypertension 03/13/2014   Menometrorrhagia    Dysmenorrhea    Anemia    Hyperlipidemia 05/23/2007   OBESITY, MORBID 05/23/2007   DYSFUNCTIONAL UTERINE BLEEDING - s/p LAVH 05/23/2007   ROTATOR CUFF SYNDROME 05/23/2007   KNEE PAIN, HX OF 05/23/2007   Past Medical History:  Diagnosis Date   Anemia    Anxiety    Arthritis  Diabetes mellitus without complication ([place])    recent dx diet controlled   DVT (deep venous thrombosis) ([facility])    remote - took coumadin   Dysmenorrhea    Dysrhythmia    irregular heart beat   Fibroid    GERD (gastroesophageal reflux disease)    hx of   Gout    Hypercholesteremia    Hypertension 4 months   Menometrorrhagia    Morbid obesity ([facility])    Prediabetes    Substance abuse ([place])     Family History  Problem Relation Age of Onset   Diabetes Mother    Hypertension Mother    Cancer Mother    Deep vein thrombosis Mother        and PE   Diabetes Father    Hypertension Father    Cancer Father 33   Throat cancer Brother 48   Pulmonary embolism Daughter    Heart disease Other        No family  history    Past Surgical History:  Procedure Laterality Date   ABDOMINAL HYSTERECTOMY  2008   BREAST BIOPSY Left 2013   benign   BUNIONECTOMY Bilateral    CHOLECYSTECTOMY     COLONOSCOPY WITH PROPOFOL N/A 10/27/2018   Procedure: COLONOSCOPY WITH PROPOFOL;  Surgeon: Wonda Horner, MD;  Location: [facility];  Service: Endoscopy;  Laterality: N/A;   ECTOPIC PREGNANCY SURGERY  1980's   MANDIBLE OSTEOTOMY Bilateral 07/03/2013   Procedure: BILATERAL TORI;  Surgeon: Gae Bon, DDS;  Location: [facility];  Service: Oral Surgery;  Laterality: Bilateral;   NM MYOCAR PERF WALL MOTION  07/2012   lexiscan - normal pattern of perfusion, low risk   ROTATOR CUFF REPAIR Bilateral 2009   SLEEP STUDY  07/27/2012   AHI 4.8/hr   TOOTH EXTRACTION N/A 07/03/2013   Procedure: DENTAL EXTRACTION # 20;  Surgeon: Gae Bon, DDS;  Location: [facility];  Service: Oral Surgery;  Laterality: N/A;   TRANSTHORACIC ECHOCARDIOGRAM  07/2012   EF=>55%, mod conc LVH; LA mod dilated; trace MR; mild TR with normal RVSP; trace AV regurg   TUBAL LIGATION     Social History   Occupational History    Employer: UNEMPLOYED    Comment: Disability  Tobacco Use   Smoking status: Former    Packs/day: 0.10    Years: 6.00    Total pack years: 0.60    Types: Cigarettes    Quit date: 02/14/2011    Years since quitting: 11.2   Smokeless tobacco: Never  Vaping Use   Vaping Use: Never used  Substance and Sexual Activity   Alcohol use: No    Comment: Previous ETOH abuse   Drug use: No    Comment: Previous crack use    Sexual activity: Never    Birth control/protection: Abstinence

## 2022-05-14 ENCOUNTER — Other Ambulatory Visit: Payer: Self-pay | Admitting: Orthopaedic Surgery

## 2022-05-14 DIAGNOSIS — G5603 Carpal tunnel syndrome, bilateral upper limbs: Secondary | ICD-10-CM

## 2022-05-14 MED ORDER — DIAZEPAM 5 MG PO TABS: ORAL_TABLET | ORAL | 0 refills | Status: DC

## 2022-05-14 NOTE — Telephone Encounter (Signed)
Please send in valium 10 mg prior to MRI

## 2022-05-14 NOTE — Telephone Encounter (Signed)
Pt states she needs a valium before her mri

## 2022-05-25 ENCOUNTER — Ambulatory Visit
Admission: RE | Admit: 2022-05-25 | Discharge: 2022-05-25 | Disposition: A | Discharge status: Home / Self Care | Payer: Medicaid Other | Source: Ambulatory Visit | Attending: Physician Assistant | Admitting: Physician Assistant

## 2022-05-25 DIAGNOSIS — M25532 Pain in left wrist: Secondary | ICD-10-CM

## 2022-06-03 ENCOUNTER — Encounter: Payer: Self-pay | Admitting: Orthopaedic Surgery

## 2022-06-03 ENCOUNTER — Ambulatory Visit: Payer: Medicaid Other | Admitting: Orthopaedic Surgery

## 2022-06-03 DIAGNOSIS — M25532 Pain in left wrist: Secondary | ICD-10-CM

## 2022-06-03 NOTE — Progress Notes (Signed)
Office Visit Note   Patient: [name]           Date of Birth: [birth year]           MRN: [id number] Visit Date: 06/03/2022              Requested by: Riki Sheer, NP [place],  [place] [address] PCP: Riki Sheer, NP   Assessment & Plan: Visit Diagnoses:  1. Pain in left wrist     Plan: [name] been experiencing chronic left wrist pain as previously outlined.  She has not done well with local cortisone injection about the wrist.  Accordingly I ordered an MRI scan.  The study was performed without contrast and demonstrated a complete tear of the body of the TFCC.  Carpal tunnel and Guyon's canal were normal.  There was partial-thickness cartilage loss of the scaphoid trapezial trapezoid joint.  Partial-thickness cartilage loss with severe subchondral bone marrow edema along the proximal ulnar aspect of the lunate.  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  Has been wearing the brace but really not much relief.  She is now beginning to experience similar problems with the right wrist.  I think at this point is worth having one of the hand surgeons evaluate her.  She agrees we will set up the appointment  Follow-Up Instructions: Return We will refer to the hand surgeons.   Orders:  Orders Placed This Encounter  Procedures   Ambulatory referral to Orthopedic Surgery   No orders of the defined types were placed in this encounter.     Procedures: No procedures performed   Clinical Data: No additional findings.   Subjective: Chief Complaint  Patient presents with   Left Wrist - Follow-up   Patient presents today to discuss the results of her MRI of her left wrist. She states that she has been wearing a wrist brace which is helpful when she is holding objects. Patient is currently ambulating with a cane and states that pain medication is being prescribed and  monitored through pain management.   Review of Systems   Objective: Vital Signs: There were no vitals taken for this visit.  Physical Exam Constitutional:      Appearance: She is well-developed.  Pulmonary:     Effort: Pulmonary effort is normal.  Skin:    General: Skin is warm and dry.  Neurological:     Mental Status: She is alert and oriented to person, place, and time.  Psychiatric:        Behavior: Behavior normal.     Ortho Exam right wrist is in a splint and does have some discomfort with flexion extension of the wrist with some pain in the area of the TFCC.  She had good capillary refill to her fingers.  The left wrist which has been the more symptomatic recently had similar discomfort.  There was no swelling of her digits or her wrist but did have tenderness at the radial carpal joint more towards the ulnar side.  No crepitation no pain with motion of the thumb.  Some very minimal tenderness along the distal radius and ulna.  Skin intact  Specialty Comments:  MRI LUMBAR SPINE WITHOUT CONTRAST   TECHNIQUE: Multiplanar, multisequence MR imaging of the lumbar spine was performed. No intravenous contrast was administered.   COMPARISON:  Prior radiograph from a 02/02/2021 and MRI from 07/28/2017.   FINDINGS: Segmentation:  Standard. Lowest well-formed disc space labeled the L5-S1 level.   Alignment: Exaggeration of the normal lumbar lordosis. Trace stepwise retrolisthesis of T12 on L1 and L1 on L2. Trace facet mediated anterolisthesis of L4 on L5.   Vertebrae: Vertebral body height maintained without acute or chronic fracture. Bone marrow signal intensity heterogeneous but overall within normal limits. No discrete or worrisome osseous lesions. No abnormal marrow edema.   Conus medullaris and cauda equina: Conus extends to the L1-2 level. Conus and cauda equina appear normal.   Paraspinal and other soft tissues: Paraspinous soft tissues within normal limits. Few  small benign appearing cyst noted about the visualized kidneys. Visualized visceral structures otherwise unremarkable.   Disc levels:   T11-12: Mild disc bulge with disc desiccation. Left greater than right facet hypertrophy. No significant spinal stenosis. Mild left foraminal narrowing. Right neural foramen remains patent.   T12-L1: Mild disc bulge with disc desiccation. No spinal stenosis. Foramina remain patent.   L1-2: Intervertebral disc space narrowing with diffuse disc bulge and disc desiccation. Superimposed right foraminal to extraforaminal disc protrusion (series 6, image 19). No spinal stenosis. Mild right L1 foraminal narrowing. Left neural foramen remains patent.   L2-3: Negative interspace. Mild facet and ligament flavum hypertrophy. No canal or foraminal stenosis.   L3-4: Negative interspace. Mild to moderate facet and ligament flavum hypertrophy. No spinal stenosis. Mild left greater than right L3 foraminal narrowing.   L4-5: Trace anterolisthesis. Mild disc bulge with disc desiccation. Superimposed small right foraminal disc protrusion closely approximates and/or contacts the exiting right L4 nerve root (series 6, image 37). Advanced bilateral facet arthrosis with ligament flavum hypertrophy. Resultant mild canal with moderate bilateral subarticular stenosis. Moderate bilateral L4 foraminal narrowing, worse on the right.   L5-S1: Disc desiccation with diffuse disc bulge. Mild reactive endplate spurring. Moderate left worse than right facet hypertrophy. No significant spinal stenosis. Moderate left worse than right L5 foraminal stenosis.   IMPRESSION: 1. Multifactorial degenerative changes at L4-5 with resultant mild canal with moderate bilateral subarticular stenosis, with moderate bilateral L4 foraminal narrowing. Superimposed right foraminal disc protrusion at this level could potentially affect the exiting right L4 nerve root. 2. Disc bulging with facet  hypertrophy at L5-S1 with resultant moderate left worse than right L5 foraminal stenosis. 3. Additional mild noncompressive disc bulging with facet hypertrophy at L1-2 and L2-3 without significant stenosis or neural impingement.     Electronically Signed   By: Jeannine Boga M.D.   On: 12/20/2021 03:21  Imaging: No results found.   PMFS History: Patient Active Problem List   Diagnosis Date Noted   Peripheral neuropathy 05/13/2022   Pain in left wrist 04/24/2022   Carpal tunnel syndrome, bilateral 03/18/2022   Plantar fasciitis of right foot 09/03/2021   Pes planus 09/03/2021   Nuclear sclerotic cataract of both eyes 05/27/2021   Diabetes mellitus without complication ([facility]) 78/29/5621   Vitreomacular adhesion of both eyes 05/27/2021   Antiphospholipid antibody positive 05/21/2021   Pain in left elbow 02/19/2021   Chronic left-sided low back pain with left-sided sciatica 07/22/2017   Thyroid nodule 04/16/2017   Essential hypertension 03/13/2014   Menometrorrhagia    Dysmenorrhea    Anemia    Hyperlipidemia 05/23/2007   OBESITY, MORBID 05/23/2007   DYSFUNCTIONAL UTERINE BLEEDING - s/p LAVH 05/23/2007   ROTATOR CUFF SYNDROME 05/23/2007   KNEE PAIN, HX OF 05/23/2007   Past Medical History:  Diagnosis Date   Anemia    Anxiety    Arthritis  Diabetes mellitus without complication ([facility])    recent dx diet controlled   DVT (deep venous thrombosis) (HCC)    remote - took coumadin   Dysmenorrhea    Dysrhythmia    irregular heart beat   Fibroid    GERD (gastroesophageal reflux disease)    hx of   Gout    Hypercholesteremia    Hypertension 4 months   Menometrorrhagia    Morbid obesity (HCC)    Prediabetes    Substance abuse ([facility])     Family History  Problem Relation Age of Onset   Diabetes Mother    Hypertension Mother    Cancer Mother    Deep vein thrombosis Mother        and PE   Diabetes Father    Hypertension Father    Cancer Father 97   Throat  cancer Brother 60   Pulmonary embolism Daughter    Heart disease Other        No family history    Past Surgical History:  Procedure Laterality Date   ABDOMINAL HYSTERECTOMY  2008   BREAST BIOPSY Left 2013   benign   BUNIONECTOMY Bilateral    CHOLECYSTECTOMY     COLONOSCOPY WITH PROPOFOL N/A 10/27/2018   Procedure: COLONOSCOPY WITH PROPOFOL;  Surgeon: Wonda Horner, MD;  Location: [facility];  Service: Endoscopy;  Laterality: N/A;   ECTOPIC PREGNANCY SURGERY  1980's   MANDIBLE OSTEOTOMY Bilateral 07/03/2013   Procedure: BILATERAL TORI;  Surgeon: Gae Bon, DDS;  Location: [facility];  Service: Oral Surgery;  Laterality: Bilateral;   NM MYOCAR PERF WALL MOTION  07/2012   lexiscan - normal pattern of perfusion, low risk   ROTATOR CUFF REPAIR Bilateral 2009   SLEEP STUDY  07/27/2012   AHI 4.8/hr   TOOTH EXTRACTION N/A 07/03/2013   Procedure: DENTAL EXTRACTION # 20;  Surgeon: Gae Bon, DDS;  Location: [facility];  Service: Oral Surgery;  Laterality: N/A;   TRANSTHORACIC ECHOCARDIOGRAM  07/2012   EF=>55%, mod conc LVH; LA mod dilated; trace MR; mild TR with normal RVSP; trace AV regurg   TUBAL LIGATION     Social History   Occupational History    Employer: UNEMPLOYED    Comment: Disability  Tobacco Use   Smoking status: Former    Packs/day: 0.10    Years: 6.00    Total pack years: 0.60    Types: Cigarettes    Quit date: 02/14/2011    Years since quitting: 11.3   Smokeless tobacco: Never  Vaping Use   Vaping Use: Never used  Substance and Sexual Activity   Alcohol use: No    Comment: Previous ETOH abuse   Drug use: No    Comment: Previous crack use    Sexual activity: Never    Birth control/protection: Abstinence

## 2022-06-04 ENCOUNTER — Other Ambulatory Visit: Payer: Self-pay | Admitting: Hematology and Oncology

## 2022-06-04 ENCOUNTER — Inpatient Hospital Stay: Payer: Medicaid Other | Attending: Hematology and Oncology

## 2022-06-04 ENCOUNTER — Other Ambulatory Visit: Payer: Self-pay

## 2022-06-04 ENCOUNTER — Inpatient Hospital Stay (HOSPITAL_BASED_OUTPATIENT_CLINIC_OR_DEPARTMENT_OTHER): Payer: Medicaid Other | Admitting: Hematology and Oncology

## 2022-06-04 VITALS — BP 114/71 | HR 58 | Temp 97.7°F | Resp 20 | Wt 292.3 lb

## 2022-06-04 DIAGNOSIS — Z86718 Personal history of other venous thrombosis and embolism: Secondary | ICD-10-CM | POA: Insufficient documentation

## 2022-06-04 DIAGNOSIS — R760 Raised antibody titer: Secondary | ICD-10-CM | POA: Diagnosis not present

## 2022-06-04 DIAGNOSIS — Z7901 Long term (current) use of anticoagulants: Secondary | ICD-10-CM | POA: Insufficient documentation

## 2022-06-04 LAB — CMP (CANCER CENTER ONLY)
ALT: 24 U/L (ref 0–44)
AST: 22 U/L (ref 15–41)
Albumin: 4 g/dL (ref 3.5–5.0)
Alkaline Phosphatase: 63 U/L (ref 38–126)
Anion gap: 2 — ABNORMAL LOW (ref 5–15)
BUN: 6 mg/dL — ABNORMAL LOW (ref 8–23)
CO2: 32 mmol/L (ref 22–32)
Calcium: 9.2 mg/dL (ref 8.9–10.3)
Chloride: 107 mmol/L (ref 98–111)
Creatinine: 0.77 mg/dL (ref 0.44–1.00)
GFR, Estimated: >60 mL/min (ref >60)
Glucose, Bld: 103 mg/dL — ABNORMAL HIGH (ref 70–99)
Potassium: 3.8 mmol/L (ref 3.5–5.1)
Sodium: 141 mmol/L (ref 135–145)
Total Bilirubin: 0.3 mg/dL (ref 0.3–1.2)
Total Protein: 7.2 g/dL (ref 6.5–8.1)

## 2022-06-04 LAB — CBC WITH DIFFERENTIAL (CANCER CENTER ONLY)
Abs Immature Granulocytes: 0.01 10*3/uL (ref 0.00–0.07)
Basophils Absolute: 0 10*3/uL (ref 0.0–0.1)
Basophils Relative: 1 %
Eosinophils Absolute: 0.2 10*3/uL (ref 0.0–0.5)
Eosinophils Relative: 4 %
HCT: 34.3 % — ABNORMAL LOW (ref 36.0–46.0)
Hemoglobin: 11.8 g/dL — ABNORMAL LOW (ref 12.0–15.0)
Immature Granulocytes: 0 %
Lymphocytes Relative: 37 %
Lymphs Abs: 2.1 10*3/uL (ref 0.7–4.0)
MCH: 29 pg (ref 26.0–34.0)
MCHC: 34.4 g/dL (ref 30.0–36.0)
MCV: 84.3 fL (ref 80.0–100.0)
Monocytes Absolute: 0.5 10*3/uL (ref 0.1–1.0)
Monocytes Relative: 9 %
Neutro Abs: 2.7 10*3/uL (ref 1.7–7.7)
Neutrophils Relative %: 49 %
Platelet Count: 259 10*3/uL (ref 150–400)
RBC: 4.07 MIL/uL (ref 3.87–5.11)
RDW: 14 % (ref 11.5–15.5)
WBC Count: 5.6 10*3/uL (ref 4.0–10.5)
nRBC: 0 % (ref 0.0–0.2)

## 2022-06-04 NOTE — Progress Notes (Signed)
[facility] Telephone:([phone]   Fax:(336) [phone]  PROGRESS NOTE  Patient Care Team: Riki Sheer, NP as PCP - General (Nurse Practitioner) Terance Ice, MD (Inactive) as Referring Physician (Cardiology)  Hematological/Oncological History  #Elevated anticardiolipin and beta-2 glycoprotein antibodies # Antiphospholipid Antibody Syndrome  1) 11/29/2010: Left Leg Doppler US after patient presented with pain and swelling in left lower extremity. Findings revealed non occlusive acute DVT involving the posterior tibial and popliteal veins of the left lower extremity. Treated with lovenox bridge and then transitioned to coumadin. Patient reports taking coumadin for two years.   2) 04/18/2021: Anticardiolipin IgG >112.0, Beta-2 Glycoprotein IgG 103.5  3) 05/20/2021: Establish care with Dede Query PA-C   HISTORY OF PRESENTING ILLNESS:  [name] 61 y.o. female returns for follow-up for elevated antiphospholipid antibodies. She is unaccompanied for this visit. She was last seen on 12/08/2021 without any changes to her health in the interim.   On exam today, Ms. Cervantez reports has been well overall in the interim since her last visit.  She reports that she did develop a torn ligament in her wrist and requires evaluation by hand specialist.  She has been intentionally losing weight and is down below 300 pounds.  She reports that she had a major change in her diet and has been avoiding fried food and drinks a protein shake once a day.  She has been eating oatmeal in the morning and has limited her diet.  She reports that she continues to take her baby aspirin twice daily and has not been having any issues with bleeding, bruising, or dark stools.  She has had no signs or symptoms concerning for recurrent VTE.  She denies any leg swelling, leg pain, chest pain, shortness of breath.  Overall she feels well and denies any fevers, chills, sweats, nausea, vomiting  or diarrhea.  Full 10 point ROS is listed below. MEDICAL HISTORY:  Past Medical History:  Diagnosis Date   Anemia    Anxiety    Arthritis    Diabetes mellitus without complication ([facility])    recent dx diet controlled   DVT (deep venous thrombosis) (H[facility])    remote - took coumadin   Dysmenorrhea    Dysrhythmia    irregular heart beat   Fibroid    GERD (gastroesophageal reflux disease)    hx of   Gout    Hypercholesteremia    Hypertension 4 months   Menometrorrhagia    Morbid obesity ([facility])    Prediabetes    Substance abuse ([facility])     SURGICAL HISTORY: Past Surgical History:  Procedure Laterality Date   ABDOMINAL HYSTERECTOMY  2008   BREAST BIOPSY Left 2013   benign   BUNIONECTOMY Bilateral    CHOLECYSTECTOMY     COLONOSCOPY WITH PROPOFOL N/A 10/27/2018   Procedure: COLONOSCOPY WITH PROPOFOL;  Surgeon: Wonda Horner, MD;  Location[facility];  Service: Endoscopy;  Laterality: N/A;   ECTOPIC PREGNANCY SURGERY  1980's   MANDIBLE OSTEOTOMY Bilateral 07/03/2013   Procedure: BILATERAL TORI;  Surgeon: Gae Bon, DDS;  Location: [facility];  Service: Oral Surgery;  Laterality: Bilateral;   NM MYOCAR PERF WALL MOTION  07/2012   lexiscan - normal pattern of perfusion, low risk   ROTATOR CUFF REPAIR Bilateral 2009   SLEEP STUDY  07/27/2012   AHI 4.8/hr   TOOTH EXTRACTION N/A 07/03/2013   Procedure: DENTAL EXTRACTION # 20;  Surgeon: Gae Bon, DDS;  Location: [facility];  Service:  Oral Surgery;  Laterality: N/A;   TRANSTHORACIC ECHOCARDIOGRAM  07/2012   EF=>55%, mod conc LVH; LA mod dilated; trace MR; mild TR with normal RVSP; trace AV regurg   TUBAL LIGATION      SOCIAL HISTORY: Social History   Socioeconomic History   Marital status: Single    Spouse name: Not on file   Number of children: 6   Years of education: 11   Highest education level: Not on file  Occupational History    Employer: UNEMPLOYED    Comment: Disability  Tobacco Use   Smoking status: Former     Packs/day: 0.10    Years: 6.00    Total pack years: 0.60    Types: Cigarettes    Quit date: 02/14/2011    Years since quitting: 11.3   Smokeless tobacco: Never  Vaping Use   Vaping Use: Never used  Substance and Sexual Activity   Alcohol use: No    Comment: Previous ETOH abuse   Drug use: No    Comment: Previous crack use    Sexual activity: Never    Birth control/protection: Abstinence  Other Topics Concern   Not on file  Social History Narrative   Not on file   Social Determinants of Health   Financial Resource Strain: Not on file  Food Insecurity: Not on file  Transportation Needs: Not on file  Physical Activity: Not on file  Stress: Not on file  Social Connections: Not on file  Intimate Partner Violence: Not on file    FAMILY HISTORY: Family History  Problem Relation Age of Onset   Diabetes Mother    Hypertension Mother    Cancer Mother    Deep vein thrombosis Mother        and PE   Diabetes Father    Hypertension Father    Cancer Father 72   Throat cancer Brother 89   Pulmonary embolism Daughter    Heart disease Other        No family history    ALLERGIES:  is allergic to buprenorphine hcl-naloxone hcl, naloxone, and dilaudid [hydromorphone hcl].  MEDICATIONS:  Current Outpatient Medications  Medication Sig Dispense Refill   allopurinol (ZYLOPRIM) 100 MG tablet Take 100 mg by mouth daily.     aspirin 81 MG chewable tablet Chew 81 mg by mouth daily.     atorvastatin (LIPITOR) 40 MG tablet Take 40 mg by mouth daily.     Blood Glucose Monitoring Suppl (ACCU-CHEK GUIDE ME) w/Device KIT      cholecalciferol (VITAMIN D3) 25 MCG (1000 UT) tablet Take 2,000 Units by mouth daily.     [employer] MAGNESIUM OXIDE 250 MG TABS Take 1 tablet by mouth daily.     cyclobenzaprine (FLEXERIL) 10 MG tablet Take 10 mg by mouth 2 (two) times daily as needed for muscle spasms.      diazepam (VALIUM) 5 MG tablet Take one tablet by mouth with food one hour prior to procedure. May  repeat 30 minutes prior if needed. 2 tablet 0   diclofenac sodium (VOLTAREN) 1 % GEL Apply 4 g topically 4 (four) times daily. 100 g 0   furosemide (LASIX) 20 MG tablet Take 20 mg by mouth daily.     gabapentin (NEURONTIN) 400 MG capsule Take 400 mg by mouth 3 (three) times daily.     glucose blood test strip TEST GLUCOSE TWICE A DAY     levothyroxine (SYNTHROID) 88 MCG tablet Take 88 mcg by mouth daily.  lidocaine (XYLOCAINE) 5 % ointment Apply 1 application topically as needed for moderate pain.     lisinopril (ZESTRIL) 5 MG tablet Take 5 mg by mouth daily.     loratadine (CLARITIN) 10 MG tablet Take 10 mg by mouth daily.     MELATONIN PO Take by mouth at bedtime as needed.     meloxicam (MOBIC) 15 MG tablet Take 15 mg by mouth daily.      metoprolol succinate (TOPROL-XL) 50 MG 24 hr tablet Take 50 mg by mouth daily. Take with or immediately following a meal.     naloxegol oxalate (MOVANTIK) 25 MG TABS tablet Take 25 mg by mouth daily.      NARCAN 4 MG/0.1ML LIQD nasal spray kit SMARTSIG:1 Spray(s) Both Nares Once PRN     oxyCODONE-acetaminophen (PERCOCET) 10-325 MG tablet Take 1 tablet by mouth 4 (four) times daily.      OZEMPIC, 0.25 OR 0.5 MG/DOSE, 2 MG/1.5ML SOPN Inject into the skin.     polyethylene glycol (MIRALAX / GLYCOLAX) packet Take 17 g by mouth daily as needed for moderate constipation.     potassium chloride SA (K-DUR,KLOR-CON) 20 MEQ tablet Take 20 mEq by mouth daily.     ROGAINE MENS EXTRA STRENGTH 5 % FOAM Apply 1 application topically daily.     Vitamin D, Ergocalciferol, (DRISDOL) 1.25 MG (50000 UNIT) CAPS capsule Take 50,000 Units by mouth once a week.     No current facility-administered medications for this visit.    REVIEW OF SYSTEMS:   Constitutional: ( - ) fevers, ( - )  chills , ( - ) night sweats Eyes: ( - ) blurriness of vision, ( - ) double vision, ( - ) watery eyes Ears, nose, mouth, throat, and face: ( - ) mucositis, ( - ) sore throat Respiratory: ( -  ) cough, ( - ) dyspnea, ( - ) wheezes Cardiovascular: ( - ) palpitation, ( - ) chest discomfort, ( - ) lower extremity swelling Gastrointestinal:  ( - ) nausea, ( - ) heartburn, ( - ) change in bowel habits Skin: ( - ) abnormal skin rashes Lymphatics: ( - ) new lymphadenopathy, ( - ) easy bruising Neurological: ( - ) numbness, ( - ) tingling, ( - ) new weaknesses Behavioral/Psych: ( - ) mood change, ( - ) new changes  All other systems were reviewed with the patient and are negative.  PHYSICAL EXAMINATION: ECOG PERFORMANCE STATUS: 1 - Symptomatic but completely ambulatory  Vitals:   06/04/22 0835  BP: 114/71  Pulse: (!) 58  Resp: 20  Temp: 97.7 F (36.5 C)  SpO2: 98%     Filed Weights   06/04/22 0835  Weight: 292 lb 4.8 oz (132.6 kg)      GENERAL: well appearing female in NAD  SKIN: skin color, texture, turgor are normal, no rashes or significant lesions EYES: conjunctiva are pink and non-injected, sclera clear OROPHARYNX: no exudate, no erythema; lips, buccal mucosa, and tongue normal  LUNGS: clear to auscultation and percussion with normal breathing effort HEART: regular rate & rhythm and no murmurs and no lower extremity edema ABDOMEN: soft, non-tender, non-distended, normal bowel sounds Musculoskeletal: no cyanosis of digits and no clubbing  PSYCH: alert & oriented x 3, fluent speech NEURO: no focal motor/sensory deficits  LABORATORY DATA:  I have reviewed the data as listed    Latest Ref Rng & Units 06/04/2022    8:21 AM 12/08/2021    8:26 AM 09/05/2021    8:16  AM  CBC  WBC 4.0 - 10.5 K/uL 5.6  6.5  4.9   Hemoglobin 12.0 - 15.0 g/dL 11.8  12.3  11.5   Hematocrit 36.0 - 46.0 % 34.3  36.6  34.9   Platelets 150 - 400 K/uL 259  257  261        Latest Ref Rng & Units 12/08/2021    8:26 AM 09/05/2021    8:16 AM 05/20/2021    9:31 AM  CMP  Glucose 70 - 99 mg/dL 110  117  91   BUN 8 - 23 mg/dL _0 Creatinine 0.44 - 1.00 mg/dL 0.91  0.86  1.09   Sodium  135 - 145 mmol/L 143  145  145   Potassium 3.5 - 5.1 mmol/L 4.0  4.0  4.1   Chloride 98 - 111 mmol/L 107  107  105   CO2 22 - 32 mmol/L 31  30  32   Calcium 8.9 - 10.3 mg/dL 9.5  8.9  9.6   Total Protein 6.5 - 8.1 g/dL 7.8  7.0  7.8   Total Bilirubin 0.3 - 1.2 mg/dL 0.3  0.3  0.3   Alkaline Phos 38 - 126 U/L 57  53  60   AST 15 - 41 U/L 19  22  32   ALT 0 - 44 U/L 18  26  59    ASSESSMENT & PLAN [name] is a 61 y.o. female returns for a follow up for elevated Anticardiolipin and Beta-2 Glycoprotein antibodies.   After review the labs, review the records, discussion with the patient the findings most consistent with antiphospholipid antibody syndrome.  She has a history of VTE and positive antiphospholipid antibodies.  She meets the diagnostic criteria for APS.  The safest way forward to be for the patient to start Coumadin therapy to prevent future VTE.  At this time the patient declines and would prefer to avoid Coumadin therapy.  She notes that she would like to take baby aspirin twice daily for thromboprophylaxis.  Today I stressed to her the importance of anticoagulation therapy and antiphospholipid antibody syndrome.  I informed her that she runs the risk of recurrent VTE, pulmonary embolism, stroke, or heart attack if not on adequate anticoagulation therapy.  She voiced understanding of the risks and benefits and wished to proceed with aspirin alone.  She noted she would call our office that she changed her mind regarding anticoagulation therapy.  We will plan to see her back in approximately 6 months time in order to reevaluate.  #Positive antiphospholipid antibodies: -Labs from 04/18/2021 showed elevated beta-2 glycoprotein IgG at 103.5 and anticardiolipin IgG >112.0.  Remained elevated on repeat testing 12 weeks later.  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 -Recommended to start anticoagulation with warfarin at last visit due to elevated antibody levels  but patient declined and is currently on ASA 81 mg BID.  --Labs today showed white blood cell count 5.6, hemoglobin 0.8, MCV 84.3, and platelets of 259. --we recommended initiating warfarin 5 mg with Lovenox bridge.  Patient declines at this time.  If she were to change her mind we would be happy to see her back early and start this therapy. --RTC in 6 months with labs (offered 12 month f/u but patient declined).   No orders of the defined types were placed in this encounter.  All questions were answered. The patient knows to call the clinic with  any problems, questions or concerns.  I have spent a total of 30 minutes minutes of face-to-face and non-face-to-face time, preparing to see the patient, performing a medically appropriate examination, counseling and educating the patient, documenting clinical information in the electronic health record, and care coordination.   Ledell Peoples, MD Department of Hematology/Oncology [place] [facility] Phone: [phone] Pager: [phone] Email: [email or web address]

## 2022-06-23 ENCOUNTER — Telehealth: Payer: Self-pay | Admitting: Physician Assistant

## 2022-06-23 NOTE — Telephone Encounter (Signed)
Pt was called and advised. She will keep her appt.

## 2022-06-23 NOTE — Telephone Encounter (Signed)
Patient left a message stating she has a consult scheduled for 06/30/22 with the Neurologist, and hand surgery with hand specialist scheduled for 07/22/22. Does she need to keep the Neurology appointment since it for the same problem? Please advise patient.

## 2022-06-23 NOTE — Telephone Encounter (Signed)
Please advise 

## 2022-06-30 ENCOUNTER — Ambulatory Visit: Payer: Medicaid Other | Admitting: Neurology

## 2022-06-30 ENCOUNTER — Encounter: Payer: Self-pay | Admitting: Neurology

## 2022-06-30 VITALS — BP 128/78 | HR 69 | Ht 66.0 in | Wt 287.0 lb

## 2022-06-30 DIAGNOSIS — R202 Paresthesia of skin: Secondary | ICD-10-CM | POA: Insufficient documentation

## 2022-06-30 NOTE — Progress Notes (Signed)
Chief Complaint  Patient presents with   New Patient (Initial Visit)    Rm 15 alone. Here for consult on worsening UE neuropathy. Pt reports sx on going now for 5 month worse on the left. She is scheduled for [facility] surgery next month.       ASSESSMENT AND PLAN  [name] is a 61 y.o. female   Bilateral wrist pain, finger paresthesia, left wrist pain,  EMG nerve conduction study for evaluation of upper extremity focal neuropathy  Undifferentiated connective tissue disorder, Antiphospholipid syndrome History of DVT  Refused anticoagulation and Plaquenil treatment, per rheumatologist and hematologist note   DIAGNOSTIC DATA (LABS, IMAGING, TESTING) - I reviewed patient records, labs, notes, testing and imaging myself where available.   MEDICAL HISTORY:  [name], is a 61 year old female seen in request by her primary care PA persons, Bevely Palmer, for evaluation of left wrist pain, finger paresthesia  I reviewed and summarized the referring note.PMHX HLD Gout Hypothyrodism HTN DM Obesity Hx of DVT  She spent most of the time taking care of her young grandchildren, great-grandchildren at home, since April 2023, she began to noticed bilateral hands numbness tingling, affecting her nondominant left hand more, numbness tingling, burning pain, left wrist discomfort,  Already evaluated by hand surgeon, planning left hand surgery on July 22, 2022, was referred for EMG nerve conduction study  She denies significant neck pain, does has bilateral rotator cuff, shoulder achy pain discomfort, she denies significant gait abnormality  Laboratory evaluation 2023, CBC showed hemoglobin of 11.8, normal CMP creatinine 0.77, In November 2022, anticardiolipin antibody was significantly elevated 45 in 2022, 2021 was 112, double-stranded DNA was mildly elevated 14, was seen by rheumatologist Dr. Estanislado Pandy in July 2022, given the diagnosis of undifferentiated connective  tissue disease, she was started on Plaquenil 200 mg daily since July 2022, but has stopped, does not want to start, last visit was in September 2022, rheumatologist have recommended anticoagulation and Plaquenil, she declined both,  Was also seen by hematologist for pain and swelling left lower extremity, history of left lower extremity DVT in 2012, was treated with Coumadin for 2 years, with her positive anticardiolipin antibody, consistent with antiphospholipid syndrome, she was recommended for anticoagulation, but patient declined, on aspirin 81 mg daily,  PHYSICAL EXAM:   Vitals:   06/30/22 0927  BP: 128/78  Pulse: 69  Weight: 287 lb (130.2 kg)  Height: '5\' 6"'  (1.676 m)   Not recorded     Body mass index is 46.32 kg/m.  PHYSICAL EXAMNIATION:  Gen: NAD, conversant, well nourised, well groomed                     Cardiovascular: Regular rate rhythm, no peripheral edema, warm, nontender. Eyes: Conjunctivae clear without exudates or hemorrhage Neck: Supple, no carotid bruits. Pulmonary: Clear to auscultation bilaterally   NEUROLOGICAL EXAM:  MENTAL STATUS: Speech/cognition: Awake, alert, oriented to history taking and casual conversation CRANIAL NERVES: CN II: Visual fields are full to confrontation. Pupils are round equal and briskly reactive to light. CN III, IV, VI: extraocular movement are normal. No ptosis. CN V: Facial sensation is intact to light touch CN VII: Face is symmetric with normal eye closure  CN VIII: Hearing is normal to causal conversation. CN IX, X: Phonation is normal. CN XI: Head turning and shoulder shrug are intact  MOTOR: No significant weakness, but examination is limited by wrist pain, severe bilateral Tinel signs  REFLEXES: Reflexes are 2+  and symmetric at the biceps, triceps, knees, and ankles. Plantar responses are flexor.  SENSORY: Intact to light touch, pinprick and vibratory sensation are intact in fingers and  toes.  COORDINATION: There is no trunk or limb dysmetria noted.  GAIT/STANCE: Need push-up to get up from seated position, cautious,  REVIEW OF SYSTEMS:  Full 14 system review of systems performed and notable only for as above All other review of systems were negative.   ALLERGIES: Allergies  Allergen Reactions   Buprenorphine Hcl-Naloxone Hcl Hives and Shortness Of Breath   Naloxone Hives and Shortness Of Breath   Dilaudid [Hydromorphone Hcl] Hives    HOME MEDICATIONS: Current Outpatient Medications  Medication Sig Dispense Refill   allopurinol (ZYLOPRIM) 100 MG tablet Take 100 mg by mouth daily.     aspirin 81 MG chewable tablet Chew 81 mg by mouth daily.     atorvastatin (LIPITOR) 40 MG tablet Take 40 mg by mouth daily.     Blood Glucose Monitoring Suppl (ACCU-CHEK GUIDE ME) w/Device KIT      cholecalciferol (VITAMIN D3) 25 MCG (1000 UT) tablet Take 2,000 Units by mouth daily.     cyclobenzaprine (FLEXERIL) 10 MG tablet Take 10 mg by mouth 2 (two) times daily as needed for muscle spasms.      diclofenac sodium (VOLTAREN) 1 % GEL Apply 4 g topically 4 (four) times daily. 100 g 0   furosemide (LASIX) 20 MG tablet Take 20 mg by mouth daily.     gabapentin (NEURONTIN) 400 MG capsule Take 400 mg by mouth 3 (three) times daily.     glucose blood test strip TEST GLUCOSE TWICE A DAY     levothyroxine (SYNTHROID) 88 MCG tablet Take 88 mcg by mouth daily.     lidocaine (XYLOCAINE) 5 % ointment Apply 1 application topically as needed for moderate pain.     lisinopril (ZESTRIL) 5 MG tablet Take 5 mg by mouth daily.     loratadine (CLARITIN) 10 MG tablet Take 10 mg by mouth daily.     MELATONIN PO Take by mouth at bedtime as needed.     metoprolol succinate (TOPROL-XL) 50 MG 24 hr tablet Take 50 mg by mouth daily. Take with or immediately following a meal.     naloxegol oxalate (MOVANTIK) 25 MG TABS tablet Take 25 mg by mouth daily.      NARCAN 4 MG/0.1ML LIQD nasal spray kit SMARTSIG:1  Spray(s) Both Nares Once PRN     oxyCODONE-acetaminophen (PERCOCET) 10-325 MG tablet Take 1 tablet by mouth 4 (four) times daily.      OZEMPIC, 0.25 OR 0.5 MG/DOSE, 2 MG/1.5ML SOPN Inject into the skin.     polyethylene glycol (MIRALAX / GLYCOLAX) packet Take 17 g by mouth daily as needed for moderate constipation.     potassium chloride SA (K-DUR,KLOR-CON) 20 MEQ tablet Take 20 mEq by mouth daily.     ROGAINE MENS EXTRA STRENGTH 5 % FOAM Apply 1 application topically daily.     Vitamin D, Ergocalciferol, (DRISDOL) 1.25 MG (50000 UNIT) CAPS capsule Take 50,000 Units by mouth once a week.     No current facility-administered medications for this visit.    PAST MEDICAL HISTORY: Past Medical History:  Diagnosis Date   Anemia    Anxiety    Arthritis    Diabetes mellitus without complication ([place])    recent dx diet controlled   DVT (deep venous thrombosis) ([place])    remote - took coumadin   Dysmenorrhea  Dysrhythmia    irregular heart beat   Fibroid    GERD (gastroesophageal reflux disease)    hx of   Gout    Hypercholesteremia    Hypertension 4 months   Menometrorrhagia    Morbid obesity ([place])    Prediabetes    Substance abuse ([place])     PAST SURGICAL HISTORY: Past Surgical History:  Procedure Laterality Date   ABDOMINAL HYSTERECTOMY  2008   BREAST BIOPSY Left 2013   benign   BUNIONECTOMY Bilateral    CHOLECYSTECTOMY     COLONOSCOPY WITH PROPOFOL N/A 10/27/2018   Procedure: COLONOSCOPY WITH PROPOFOL;  Surgeon: Wonda Horner, MD;  Location: [facility];  Service: Endoscopy;  Laterality: N/A;   ECTOPIC PREGNANCY SURGERY  1980's   MANDIBLE OSTEOTOMY Bilateral 07/03/2013   Procedure: BILATERAL TORI;  Surgeon: Gae Bon, DDS;  Location: [facility];  Service: Oral Surgery;  Laterality: Bilateral;   NM MYOCAR PERF WALL MOTION  07/2012   lexiscan - normal pattern of perfusion, low risk   ROTATOR CUFF REPAIR Bilateral 2009   SLEEP STUDY  07/27/2012   AHI 4.8/hr   TOOTH  EXTRACTION N/A 07/03/2013   Procedure: DENTAL EXTRACTION # 20;  Surgeon: Gae Bon, DDS;  Location: [facility];  Service: Oral Surgery;  Laterality: N/A;   TRANSTHORACIC ECHOCARDIOGRAM  07/2012   EF=>55%, mod conc LVH; LA mod dilated; trace MR; mild TR with normal RVSP; trace AV regurg   TUBAL LIGATION      FAMILY HISTORY: Family History  Problem Relation Age of Onset   Diabetes Mother    Hypertension Mother    Cancer Mother    Deep vein thrombosis Mother        and PE   Diabetes Father    Hypertension Father    Cancer Father 35   Throat cancer Brother 42   Pulmonary embolism Daughter    Heart disease Other        No family history    SOCIAL HISTORY: Social History   Socioeconomic History   Marital status: Single    Spouse name: Not on file   Number of children: 6   Years of education: 11   Highest education level: Not on file  Occupational History    Employer: UNEMPLOYED    Comment: Disability  Tobacco Use   Smoking status: Former    Packs/day: 0.10    Years: 6.00    Total pack years: 0.60    Types: Cigarettes    Quit date: 02/14/2011    Years since quitting: 11.3   Smokeless tobacco: Never  Vaping Use   Vaping Use: Never used  Substance and Sexual Activity   Alcohol use: No    Comment: Previous ETOH abuse   Drug use: No    Comment: Previous crack use    Sexual activity: Never    Birth control/protection: Abstinence  Other Topics Concern   Not on file  Social History Narrative   Not on file   Social Determinants of Health   Financial Resource Strain: Not on file  Food Insecurity: Not on file  Transportation Needs: Not on file  Physical Activity: Not on file  Stress: Not on file  Social Connections: Not on file  Intimate Partner Violence: Not on file      Marcial Pacas, M.D. Ph.D.  [facility] [address], [place], [place] [address] Ph: [phone] Fax: [phone]  CC:  Persons, Bevely Palmer, [place]  [place],  [employer] [id number]  Riki Sheer, NP

## 2022-07-10 ENCOUNTER — Ambulatory Visit: Payer: Medicaid Other | Admitting: Neurology

## 2022-07-10 ENCOUNTER — Encounter: Payer: Self-pay | Admitting: Neurology

## 2022-07-10 ENCOUNTER — Ambulatory Visit (INDEPENDENT_AMBULATORY_CARE_PROVIDER_SITE_OTHER): Payer: Medicaid Other | Admitting: Neurology

## 2022-07-10 VITALS — BP 112/65 | HR 66 | Ht 66.0 in | Wt 285.0 lb

## 2022-07-10 DIAGNOSIS — M25532 Pain in left wrist: Secondary | ICD-10-CM | POA: Diagnosis not present

## 2022-07-10 DIAGNOSIS — R202 Paresthesia of skin: Secondary | ICD-10-CM | POA: Diagnosis not present

## 2022-07-10 DIAGNOSIS — G5603 Carpal tunnel syndrome, bilateral upper limbs: Secondary | ICD-10-CM | POA: Diagnosis not present

## 2022-07-10 NOTE — Progress Notes (Signed)
No chief complaint on file.     ASSESSMENT AND PLAN  [name] is a 61 y.o. female   Bilateral wrist pain, finger paresthesia, left wrist pain,  EMG nerve conduction study today showed mild bilateral carpal tunnel syndromes, right slightly worse than left, with only mild sensory involvement.  But she has significant tenderness at the left wrist at the radial side, mild abnormal findings would not warrant carpal tunnel decompression surgery, the patient reported that she has other underlying issues with her left wrist, is under the care of orthopedic surgeon, consider left wrist surgery.  Undifferentiated connective tissue disorder, Antiphospholipid syndrome History of DVT  Refused anticoagulation and Plaquenil treatment, per rheumatologist and hematologist note   DIAGNOSTIC DATA (LABS, IMAGING, TESTING) - I reviewed patient records, labs, notes, testing and imaging myself where available.   MEDICAL HISTORY:  [name], is a 61 year old female seen in request by her primary care PA persons, Bevely Palmer, for evaluation of left wrist pain, finger paresthesia  I reviewed and summarized the referring note.PMHX HLD Gout Hypothyrodism HTN DM Obesity Hx of DVT  She spent most of the time taking care of her young grandchildren, great-grandchildren at home, since April 2023, she began to noticed bilateral hands numbness tingling, affecting her nondominant left hand more, numbness tingling, burning pain, left wrist discomfort,  Already evaluated by hand surgeon, planning left hand surgery on July 22, 2022, was referred for EMG nerve conduction study  She denies significant neck pain, does has bilateral rotator cuff, shoulder achy pain discomfort, she denies significant gait abnormality  Laboratory evaluation 2023, CBC showed hemoglobin of 11.8, normal CMP creatinine 0.77, In November 2022, anticardiolipin antibody was significantly elevated 45 in 2022, 2021  was 112, double-stranded DNA was mildly elevated 14, was seen by rheumatologist Dr. Estanislado Pandy in July 2022, given the diagnosis of undifferentiated connective tissue disease, she was started on Plaquenil 200 mg daily since July 2022, but has stopped, does not want to start, last visit was in September 2022, rheumatologist have recommended anticoagulation and Plaquenil, she declined both,  Was also seen by hematologist for pain and swelling left lower extremity, history of left lower extremity DVT in 2012, was treated with Coumadin for 2 years, with her positive anticardiolipin antibody, consistent with antiphospholipid syndrome, she was recommended for anticoagulation, but patient declined, on aspirin 81 mg daily,  Update July 10, 2022: She return for EMG nerve conduction study today, which showed mild bilateral carpal tunnel syndromes, right slightly worse than left, but she complains of significant left wrist pain, tenderness of left wrist radial side  PHYSICAL EXAM: Blood pressure 112/65, heart rate of 66,  PHYSICAL EXAMNIATION:  Gen: NAD, conversant, well nourised, well groomed         NEUROLOGICAL EXAM:  MENTAL STATUS: Speech/cognition: Awake, alert, oriented to history taking and casual conversation CRANIAL NERVES: CN II: Visual fields are full to confrontation. Pupils are round equal and briskly reactive to light. CN III, IV, VI: extraocular movement are normal. No ptosis. CN V: Facial sensation is intact to light touch CN VII: Face is symmetric with normal eye closure  CN VIII: Hearing is normal to causal conversation. CN IX, X: Phonation is normal. CN XI: Head turning and shoulder shrug are intact  MOTOR: No significant weakness, but examination is limited by wrist pain,  bilateral Tinel signs, left wrist radial side tenderness upon deep palpitation  REFLEXES: Reflexes are 1 and symmetric at the biceps, triceps, knees, and ankles. Plantar  responses are  flexor.  SENSORY: Intact to light touch, pinprick and vibratory sensation are intact in fingers and toes.  COORDINATION: There is no trunk or limb dysmetria noted.  GAIT/STANCE: Need push-up to get up from seated position, cautious,  REVIEW OF SYSTEMS:  Full 14 system review of systems performed and notable only for as above All other review of systems were negative.   ALLERGIES: Allergies  Allergen Reactions   Buprenorphine Hcl-Naloxone Hcl Hives and Shortness Of Breath   Naloxone Hives and Shortness Of Breath   Dilaudid [Hydromorphone Hcl] Hives    HOME MEDICATIONS: Current Outpatient Medications  Medication Sig Dispense Refill   allopurinol (ZYLOPRIM) 100 MG tablet Take 100 mg by mouth daily.     aspirin 81 MG chewable tablet Chew 81 mg by mouth daily.     atorvastatin (LIPITOR) 40 MG tablet Take 40 mg by mouth daily.     Blood Glucose Monitoring Suppl (ACCU-CHEK GUIDE ME) w/Device KIT      cholecalciferol (VITAMIN D3) 25 MCG (1000 UT) tablet Take 2,000 Units by mouth daily.     cyclobenzaprine (FLEXERIL) 10 MG tablet Take 10 mg by mouth 2 (two) times daily as needed for muscle spasms.      diclofenac sodium (VOLTAREN) 1 % GEL Apply 4 g topically 4 (four) times daily. 100 g 0   furosemide (LASIX) 20 MG tablet Take 20 mg by mouth daily.     gabapentin (NEURONTIN) 400 MG capsule Take 400 mg by mouth 3 (three) times daily.     glucose blood test strip TEST GLUCOSE TWICE A DAY     levothyroxine (SYNTHROID) 88 MCG tablet Take 88 mcg by mouth daily.     lidocaine (XYLOCAINE) 5 % ointment Apply 1 application topically as needed for moderate pain.     lisinopril (ZESTRIL) 5 MG tablet Take 5 mg by mouth daily.     loratadine (CLARITIN) 10 MG tablet Take 10 mg by mouth daily.     MELATONIN PO Take by mouth at bedtime as needed.     metoprolol succinate (TOPROL-XL) 50 MG 24 hr tablet Take 50 mg by mouth daily. Take with or immediately following a meal.     naloxegol oxalate  (MOVANTIK) 25 MG TABS tablet Take 25 mg by mouth daily.      NARCAN 4 MG/0.1ML LIQD nasal spray kit SMARTSIG:1 Spray(s) Both Nares Once PRN     oxyCODONE-acetaminophen (PERCOCET) 10-325 MG tablet Take 1 tablet by mouth 4 (four) times daily.      OZEMPIC, 0.25 OR 0.5 MG/DOSE, 2 MG/1.5ML SOPN Inject into the skin.     polyethylene glycol (MIRALAX / GLYCOLAX) packet Take 17 g by mouth daily as needed for moderate constipation.     potassium chloride SA (K-DUR,KLOR-CON) 20 MEQ tablet Take 20 mEq by mouth daily.     ROGAINE MENS EXTRA STRENGTH 5 % FOAM Apply 1 application topically daily.     Vitamin D, Ergocalciferol, (DRISDOL) 1.25 MG (50000 UNIT) CAPS capsule Take 50,000 Units by mouth once a week.     No current facility-administered medications for this visit.    PAST MEDICAL HISTORY: Past Medical History:  Diagnosis Date   Anemia    Anxiety    Arthritis    Diabetes mellitus without complication ([place])    recent dx diet controlled   DVT (deep venous thrombosis) ([place])    remote - took coumadin   Dysmenorrhea    Dysrhythmia    irregular heart beat  Fibroid    GERD (gastroesophageal reflux disease)    hx of   Gout    Hypercholesteremia    Hypertension 4 months   Menometrorrhagia    Morbid obesity ([place])    Prediabetes    Substance abuse ([place])     PAST SURGICAL HISTORY: Past Surgical History:  Procedure Laterality Date   ABDOMINAL HYSTERECTOMY  2008   BREAST BIOPSY Left 2013   benign   BUNIONECTOMY Bilateral    CHOLECYSTECTOMY     COLONOSCOPY WITH PROPOFOL N/A 10/27/2018   Procedure: COLONOSCOPY WITH PROPOFOL;  Surgeon: Wonda Horner, MD;  [facility];  Service: Endoscopy;  Laterality: N/A;   ECTOPIC PREGNANCY SURGERY  1980's   MANDIBLE OSTEOTOMY Bilateral 07/03/2013   Procedure: BILATERAL TORI;  Surgeon: Gae Bon, DDS;  Location: [facility];  Service: Oral Surgery;  Laterality: Bilateral;   NM MYOCAR PERF WALL MOTION  07/2012   lexiscan - normal pattern of  perfusion, low risk   ROTATOR CUFF REPAIR Bilateral 2009   SLEEP STUDY  07/27/2012   AHI 4.8/hr   TOOTH EXTRACTION N/A 07/03/2013   Procedure: DENTAL EXTRACTION # 20;  Surgeon: Gae Bon, DDS;  Location: [facility];  Service: Oral Surgery;  Laterality: N/A;   TRANSTHORACIC ECHOCARDIOGRAM  07/2012   EF=>55%, mod conc LVH; LA mod dilated; trace MR; mild TR with normal RVSP; trace AV regurg   TUBAL LIGATION      FAMILY HISTORY: Family History  Problem Relation Age of Onset   Diabetes Mother    Hypertension Mother    Cancer Mother    Deep vein thrombosis Mother        and PE   Diabetes Father    Hypertension Father    Cancer Father 11   Throat cancer Brother 58   Pulmonary embolism Daughter    Heart disease Other        No family history    SOCIAL HISTORY: Social History   Socioeconomic History   Marital status: Single    Spouse name: Not on file   Number of children: 6   Years of education: 11   Highest education level: Not on file  Occupational History    Employer: UNEMPLOYED    Comment: Disability  Tobacco Use   Smoking status: Former    Packs/day: 0.10    Years: 6.00    Total pack years: 0.60    Types: Cigarettes    Quit date: 02/14/2011    Years since quitting: 11.4   Smokeless tobacco: Never  Vaping Use   Vaping Use: Never used  Substance and Sexual Activity   Alcohol use: No    Comment: Previous ETOH abuse   Drug use: No    Comment: Previous crack use    Sexual activity: Never    Birth control/protection: Abstinence  Other Topics Concern   Not on file  Social History Narrative   Not on file   Social Determinants of Health   Financial Resource Strain: Not on file  Food Insecurity: Not on file  Transportation Needs: Not on file  Physical Activity: Not on file  Stress: Not on file  Social Connections: Not on file  Intimate Partner Violence: Not on file      Marcial Pacas, M.D. Ph.D.  [facility] [address], [place], [place] [address] Ph: [phone] Fax: [phone]  CC:  Riki Sheer, NP [place],  [place] [id number]  Riki Sheer,  NP

## 2022-07-12 NOTE — Progress Notes (Signed)
EMG report is under procedure 

## 2022-07-12 NOTE — Procedures (Signed)
Full Name: [name] Gender: Female MRN #: [id number] Date of Birth: 02-23-1961    Visit Date: 07/10/2022 11:22 Age: 61 Years Referring Physician: Dr. Marcial Pacas Height: 5 feet 6 inch History: 61 years old left-handed female, presenting with finger paresthesia, left wrist pain  Summary of the test: Bilateral median sensory response showed slightly increased peak latency, with well preserved snap amplitude,.  Bilateral ulnar sensory responses were normal.  Bilateral median, ulnar motor responses were normal  Electromyography:  Selected needle examinations of bilateral upper extremity muscles showed no significant abnormalities   Conclusion: This is a slight abnormal study.  There is evidence of mild bilateral median neuropathy across the wrist, consistent with mild bilateral carpal tunnel syndromes, right side slightly worse than left.  The mild abnormalities would not warrant decompression surgery through the wrist.  ------------------------------- Marcial Pacas, M.D. Ph.D.  [facility] [address] [place], [place] [address] Tel: [phone] Fax: [phone]  Verbal informed consent was obtained from the patient, patient was informed of potential risk of procedure, including bruising, bleeding, hematoma formation, infection, muscle weakness, muscle pain, numbness, among others.        [place]    Nerve / Sites Muscle Latency Ref. Amplitude Ref. Rel Amp Segments Distance Velocity Ref. Area    ms ms mV mV %  cm m/s m/s mVms  R Median - APB     Wrist APB 4.3 ?4.4 7.1 ?4.0 100 Wrist - APB 7   27.4     Upper arm APB 9.0  6.0  84.4 Upper arm - Wrist 22 47 ?49 21.7  L Median - APB     Wrist APB 3.8 ?4.4 9.2 ?4.0 100 Wrist - APB 7   37.1     Upper arm APB 8.4  7.3  79.9 Upper arm - Wrist 24 52 ?49 30.4  R Ulnar - ADM     Wrist ADM 3.0 ?3.3 6.5 ?6.0 100 Wrist - ADM 7   26.7     B.Elbow ADM 5.5  6.1  94.1 B.Elbow - Wrist 14 57 ?49 25.0      A.Elbow ADM 8.0  8.7  143 A.Elbow - B.Elbow 14 56 ?49 33.5  L Ulnar - ADM     Wrist ADM 3.5 ?3.3 8.6 ?6.0 100 Wrist - ADM 7   37.0     B.Elbow ADM 6.4  7.6  88.5 B.Elbow - Wrist 18 62 ?49 32.5     A.Elbow ADM 8.4  8.7  114 A.Elbow - B.Elbow 11 57 ?49 40.4             SNC    Nerve / Sites Rec. Site Peak Lat Ref.  Amp Ref. Segments Distance    ms ms V V  cm  R Median - Orthodromic (Dig II, Mid palm)     Dig II Wrist 3.8 ?3.4 10 ?10 Dig II - Wrist 13  L Median - Orthodromic (Dig II, Mid palm)     Dig II Wrist 3.5 ?3.4 12 ?10 Dig II - Wrist 13  R Ulnar - Orthodromic, (Dig V, Mid palm)     Dig V Wrist 2.9 ?3.1 9 ?5 Dig V - Wrist 11  L Ulnar - Orthodromic, (Dig V, Mid palm)     Dig V Wrist 2.9 ?3.1 16 ?5 Dig V - Wrist 11             F  Wave    Nerve  F Lat Ref.   ms ms  R Ulnar - ADM 30.9 ?32.0  L Ulnar - ADM 31.6 ?32.0         EMG Summary Table    Spontaneous MUAP Recruitment  Muscle IA Fib PSW Fasc Other Amp Dur. Poly Pattern  R. First dorsal interosseous Normal None None None _______ Normal Normal Normal Normal  R. Pronator teres Normal None None None _______ Normal Normal Normal Normal  R. Brachioradialis Normal None None None _______ Normal Normal Normal Normal  R. Biceps brachii Normal None None None _______ Normal Normal Normal Normal  R. Deltoid Normal None None None _______ Normal Normal Normal Normal  R. Triceps brachii Normal None None None _______ Normal Normal Normal Normal  L. First dorsal interosseous Normal None None None _______ Normal Normal Normal Normal  L. Pronator teres Normal None None None _______ Normal Normal Normal Normal  L. Brachioradialis Normal None None None _______ Normal Normal Normal Normal  L. Biceps brachii Normal None None None _______ Normal Normal Normal Normal  L. Deltoid Normal None None None _______ Normal Normal Normal Normal  L. Triceps brachii Normal None None None _______ Normal Normal Normal Normal

## 2022-07-14 ENCOUNTER — Encounter (INDEPENDENT_AMBULATORY_CARE_PROVIDER_SITE_OTHER): Payer: Self-pay | Admitting: Family Medicine

## 2022-07-14 ENCOUNTER — Ambulatory Visit (INDEPENDENT_AMBULATORY_CARE_PROVIDER_SITE_OTHER): Payer: Medicaid Other | Admitting: Family Medicine

## 2022-07-14 VITALS — BP 114/75 | HR 74 | Temp 98.2°F | Ht 66.0 in | Wt 282.0 lb

## 2022-07-14 DIAGNOSIS — Z6841 Body Mass Index (BMI) 40.0 and over, adult: Secondary | ICD-10-CM

## 2022-07-14 DIAGNOSIS — E119 Type 2 diabetes mellitus without complications: Secondary | ICD-10-CM

## 2022-07-14 DIAGNOSIS — Z0289 Encounter for other administrative examinations: Secondary | ICD-10-CM

## 2022-07-14 DIAGNOSIS — E1142 Type 2 diabetes mellitus with diabetic polyneuropathy: Secondary | ICD-10-CM

## 2022-07-14 DIAGNOSIS — M160 Bilateral primary osteoarthritis of hip: Secondary | ICD-10-CM

## 2022-07-14 DIAGNOSIS — I10 Essential (primary) hypertension: Secondary | ICD-10-CM

## 2022-07-14 NOTE — Progress Notes (Signed)
Office: [phone]  /  Fax: [phone]  Initial Visit  [name] was seen in clinic today to evaluate for obesity. She is interested in losing weight to improve overall health and reduce the risk of weight related complications.   She presents today to review program treatment options, initial physical assessment, and evaluation.     She is actively working on weight reduction in order to have Left followed by right total hip arthroplasty for DJD.  Exercise is limited due to pain.  She has seen some weight loss and improved satiety on Ozempic 0.5 mg Rayne weekly for T2DM.  Her max weight was 330 lb.  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    Past medical history includes:   Past Medical History:  Diagnosis Date   Anemia    Anxiety    Arthritis    Diabetes mellitus without complication ([place])    recent dx diet controlled   DVT (deep venous thrombosis) (HCC)    remote - took coumadin   Dysmenorrhea    Dysrhythmia    irregular heart beat   Fibroid    GERD (gastroesophageal reflux disease)    hx of   Gout    Hypercholesteremia    Hypertension 4 months   Menometrorrhagia    Morbid obesity (HCC)    Prediabetes    Substance abuse (HCC)      Objective:   BP 114/75   Pulse 74   Temp 98.2 F (36.8 C)   Ht '5\' 6"'$  (1.676 m)   Wt 282 lb (127.9 kg)   SpO2 98%   BMI 45.52 kg/m  She was weighed on the bioimpedance scale:  Body mass index is 45.52 kg/m.  General:  Alert, oriented and cooperative. Patient is in no acute distress.  Respiratory: Normal respiratory effort, no problems with respiration noted  Extremities: Normal range of motion.    Mental Status: Normal mood and affect. Normal behavior. Normal judgment and thought content.   Assessment and Plan:   Type II diabetes without Insulin use Class III obesity with BMI 45 HTN, primary DJD hips, bilateral     Obesity Treatment Plan:  She will work on garnering support from family and friends to begin  weight loss journey. Work on eliminating or reducing the presence of highly processed, calorie dense foods in the home. Complete provided nutritional and psychosocial assessment questionnaire.    Keelyn Monjaras [place] will follow up in the next 1-2 weeks to review the above steps and continue evaluation and treatment.  Obesity Education Performed Today:  She was weighed on the bioimpedance scale and results were discussed and documented in the synopsis.  We discussed obesity as a disease and the importance of a more detailed evaluation of all the factors contributing to the disease.  We discussed the importance of long term lifestyle changes which include nutrition, exercise and behavioral modifications as well as the importance of customizing this to her specific health and social needs.  We discussed the benefits of reaching a healthier weight to alleviate the symptoms of existing conditions and reduce the risks of the biomechanical, metabolic and psychological effects of obesity.  We discussed the goals of this program is to improve her overall health and not simply achieve a specific BMI.  Frequent visits are very important to patient success. I plan to see her every 2 weeks for the first 3 months and then evaluate the visit frequency after that time. I explained obesity is a life-long chronic  disease and long term treatments would be required. Medications to help her follow his eating plan may be offered as appropriate but are not required. All medication decisions will be made together after the initial workup is done and benefits and side effects are discussed in depth.  The clinic rules were reviewed including the late policy, cancellation policy, no show and program fees.  Soleia Badolato [place] appears to be in the action stage of change and states they are ready to start intensive lifestyle modifications and behavioral modifications.  45 minutes was spent today on this visit  including the above counseling, pre-visit chart review, and post-visit documentation.  Dell Ponto, DO

## 2022-07-29 ENCOUNTER — Encounter (INDEPENDENT_AMBULATORY_CARE_PROVIDER_SITE_OTHER): Payer: Self-pay | Admitting: Family Medicine

## 2022-07-29 ENCOUNTER — Ambulatory Visit (INDEPENDENT_AMBULATORY_CARE_PROVIDER_SITE_OTHER): Payer: Medicaid Other | Admitting: Family Medicine

## 2022-07-29 VITALS — BP 147/81 | HR 67 | Temp 97.8°F | Ht 66.0 in | Wt 280.0 lb

## 2022-07-29 DIAGNOSIS — E1169 Type 2 diabetes mellitus with other specified complication: Secondary | ICD-10-CM

## 2022-07-29 DIAGNOSIS — M1612 Unilateral primary osteoarthritis, left hip: Secondary | ICD-10-CM | POA: Insufficient documentation

## 2022-07-29 DIAGNOSIS — R5383 Other fatigue: Secondary | ICD-10-CM

## 2022-07-29 DIAGNOSIS — E7849 Other hyperlipidemia: Secondary | ICD-10-CM | POA: Insufficient documentation

## 2022-07-29 DIAGNOSIS — R0602 Shortness of breath: Secondary | ICD-10-CM

## 2022-07-29 DIAGNOSIS — E669 Obesity, unspecified: Secondary | ICD-10-CM

## 2022-07-29 DIAGNOSIS — M160 Bilateral primary osteoarthritis of hip: Secondary | ICD-10-CM

## 2022-07-29 DIAGNOSIS — Z6841 Body Mass Index (BMI) 40.0 and over, adult: Secondary | ICD-10-CM

## 2022-07-29 DIAGNOSIS — Z7985 Long-term (current) use of injectable non-insulin antidiabetic drugs: Secondary | ICD-10-CM

## 2022-07-29 DIAGNOSIS — I10 Essential (primary) hypertension: Secondary | ICD-10-CM

## 2022-07-29 DIAGNOSIS — Z1331 Encounter for screening for depression: Secondary | ICD-10-CM | POA: Diagnosis not present

## 2022-07-30 LAB — LIPID PANEL
Chol/HDL Ratio: 2.2 ratio (ref 0.0–4.4)
Cholesterol, Total: 129 mg/dL (ref 100–199)
HDL: 59 mg/dL (ref >39)
LDL Chol Calc (NIH): 53 mg/dL (ref 0–99)
Triglycerides: 92 mg/dL (ref 0–149)
VLDL Cholesterol Cal: 17 mg/dL (ref 5–40)

## 2022-07-30 LAB — INSULIN, RANDOM: INSULIN: 8.2 u[IU]/mL (ref 2.6–24.9)

## 2022-07-30 LAB — HEMOGLOBIN A1C
Est. average glucose Bld gHb Est-mCnc: 114 mg/dL
Hgb A1c MFr Bld: 5.6 % (ref 4.8–5.6)

## 2022-07-30 LAB — TSH: TSH: 0.589 u[IU]/mL (ref 0.450–4.500)

## 2022-07-30 LAB — VITAMIN D 25 HYDROXY (VIT D DEFICIENCY, FRACTURES): Vit D, 25-Hydroxy: 82.9 ng/mL (ref 30.0–100.0)

## 2022-07-30 LAB — VITAMIN B12: Vitamin B-12: 1733 pg/mL — ABNORMAL HIGH (ref 232–1245)

## 2022-07-30 LAB — T4, FREE: Free T4: 1.29 ng/dL (ref 0.82–1.77)

## 2022-07-30 LAB — FOLATE: Folate: >20 ng/mL (ref >3.0)

## 2022-08-10 NOTE — Progress Notes (Unsigned)
Chief Complaint:   [name] (MR# [id number]) is a 61 y.o. female who presents for evaluation and treatment of obesity and related comorbidities. Current BMI is Body mass index is 45.19 kg/m. [name] has been struggling with her weight for many years and has been unsuccessful in either losing weight, maintaining weight loss, or reaching her healthy weight goal.  She is motivated to work on weight loss.  She needs BMI to be <40 for bilateral hip replacement surgery  She has worked on weight loss several times.  She denies large portions. In the habit of eating ice cream every night.  She does some walking.  She has a treadmill.   [name] is currently in the action stage of change and ready to dedicate time achieving and maintaining a healthier weight. [name] is interested in becoming our patient and working on intensive lifestyle modifications including (but not limited to) diet and exercise for weight loss.  [name] habits were reviewed today and are as follows: her desired weight loss is 10 lbs, she started gaining weight after having her hysterectomy , her heaviest weight ever was 350 pounds, she has significant food cravings issues, she snacks frequently in the evenings, and she skips meals frequently.  Depression Screen [name]'s Food and Mood (modified PHQ-9) score was 3.     07/29/2022    8:56 AM  Depression screen PHQ 2/9  Decreased Interest 0  Down, Depressed, Hopeless 0  PHQ - 2 Score 0  Altered sleeping 1  Tired, decreased energy 1  Change in appetite 0  Feeling bad or failure about yourself  0  Trouble concentrating 1  Moving slowly or fidgety/restless 0  Suicidal thoughts 0  PHQ-9 Score 3  Difficult doing work/chores Not difficult at all   Subjective:   1. Other fatigue EKG and IC reviewed.   [name] admits to daytime somnolence and admits to waking up still tired. Patient has a history of symptoms of N/A. [name] generally gets 10  hours of sleep per night, and states that she has generally restful sleep. Snoring is present. Apneic episodes are not present. Epworth Sleepiness Score is 5.   2. SOBOE (shortness of breath on exertion) [name] notes increasing shortness of breath with exercising and seems to be worsening over time with weight gain. She notes getting out of breath sooner with activity than she used to. This has not gotten worse recently. [name] denies shortness of breath at rest or orthopnea.  3. Essential hypertension Blood pressure elevated today.  She is on Toprol  XL 50 mg daily.  Lisinopril 5 mg daily.  She denies chest pain.   4. Type 2 diabetes mellitus with other specified complication, unspecified whether long term insulin use ([place]) She is on Ozempic 0.5 mg once weekly, she is doing well on it without adverse side effects. She is checking her blood sugars at home.  She enjoys improved satiety from [place].    5. Osteoarthritis of both hips, unspecified osteoarthritis type Needs left hip followed by the right hip total knee replacement.  Exercise is still limited due to pain.  She is able to walk 30-45 minutes outdoor and on a treadmill.   6. Other hyperlipidemia She is on Lipitor 40 mg daily. Recent labs not available for review (done at [facility]).  She denies any myalgias.   Assessment/Plan:   1. Other fatigue Reviewed CBC and CMP from August.   [name] does feel that her weight is causing her  energy to be lower than it should be. Fatigue may be related to obesity, depression or many other causes. Labs will be ordered, and in the meanwhile, [name] will focus on self care including making healthy food choices, increasing physical activity and focusing on stress reduction.   Update labs today.   - EKG 12-Lead - VITAMIN D 25 Hydroxy (Vit-D Deficiency, Fractures) - TSH - T4, free - Lipid panel - Insulin, random - Hemoglobin A1c - Folate - Vitamin B12  2. SOBOE (shortness of  breath on exertion) [name] does feel that she gets out of breath more easily that she used to when she exercises. Girl's shortness of breath appears to be obesity related and exercise induced. She has agreed to work on weight loss and gradually increase exercise to treat her exercise induced shortness of breath. Will continue to monitor closely.  3. Essential hypertension Continue current blood pressure medications per PCP.  Look for Bioimpedance with weight loss.   - lisinopril (ZESTRIL) 5 MG tablet; Take 1 tablet by mouth daily.  4. Type 2 diabetes mellitus with other specified complication, unspecified whether long term insulin use ([name]) Check labs today.   - Insulin, random - Hemoglobin A1c  5. Osteoarthritis of both hips, unspecified osteoarthritis type Aim for 30 minutes of walking 5 times per week.  Begin BMI reduction for hip surgery.   6. Other hyperlipidemia Continue Lipitor 40 mg daily.  Check labs today.   - Lipid panel  7. Depression screening [name] had a negative depression screening. Depression is commonly associated with obesity and often results in emotional eating behaviors. We will monitor this closely and work on CBT to help improve the non-hunger eating patterns. Referral to Psychology may be required if no improvement is seen as she continues in our clinic.   8. Obesity,current BMI 45.3 [name] is currently in the action stage of change and her goal is to continue with weight loss efforts. I recommend [name] begin the structured treatment plan as follows:  She has agreed to the Category 3 Plan.  Exercise goals: All adults should avoid inactivity. Some physical activity is better than none, and adults who participate in any amount of physical activity gain some health benefits.   Behavioral modification strategies: increasing lean protein intake, increasing vegetables, increasing water intake, decreasing eating out, no skipping meals, meal planning and  cooking strategies, keeping healthy foods in the home, ways to avoid boredom eating, avoiding temptations, and decreasing junk food.  She was informed of the importance of frequent follow-up visits to maximize her success with intensive lifestyle modifications for her multiple health conditions. She was informed we would discuss her lab results at her next visit unless there is a critical issue that needs to be addressed sooner. Aarohi agreed to keep her next visit at the agreed upon time to discuss these results.  Objective:   Blood pressure (!) 147/81, pulse 67, temperature 97.8 F (36.6 C), height '5\' 6"'$  (1.676 m), weight 280 lb (127 kg), SpO2 99 %. Body mass index is 45.19 kg/m.  EKG: Normal sinus rhythm, rate 72 BPM.  Indirect Calorimeter completed today shows a VO2 of 300 and a REE of 2074.  Her calculated basal metabolic rate is 4481 thus her basal metabolic rate is worse than expected.  General: Cooperative, alert, well developed, in no acute distress. HEENT: Conjunctivae and lids unremarkable. Cardiovascular: Regular rhythm.  Lungs: Normal work of breathing. Neurologic: No focal deficits.   Lab Results  Component Value Date  CREATININE 0.77 06/04/2022   BUN 6 (L) 06/04/2022   NA 141 06/04/2022   K 3.8 06/04/2022   CL 107 06/04/2022   CO2 32 06/04/2022   Lab Results  Component Value Date   ALT 24 06/04/2022   AST 22 06/04/2022   ALKPHOS 63 06/04/2022   BILITOT 0.3 06/04/2022   Lab Results  Component Value Date   HGBA1C 5.6 07/29/2022   HGBA1C 6.4 (H) 06/19/2013   HGBA1C 6.0 11/20/2009   Lab Results  Component Value Date   INSULIN 8.2 07/29/2022   Lab Results  Component Value Date   TSH 0.589 07/29/2022   Lab Results  Component Value Date   CHOL 129 07/29/2022   HDL 59 07/29/2022   LDLCALC 53 07/29/2022   TRIG 92 07/29/2022   CHOLHDL 2.2 07/29/2022   Lab Results  Component Value Date   WBC 5.6 06/04/2022   HGB 11.8 (L) 06/04/2022   HCT 34.3 (L)  06/04/2022   MCV 84.3 06/04/2022   PLT 259 06/04/2022   No results found for: "IRON", "TIBC", "FERRITIN"  Attestation Statements:   Reviewed by clinician on day of visit: allergies, medications, problem list, medical history, surgical history, family history, social history, and previous encounter notes.  Time spent on visit including pre-visit chart review and post-visit charting and care was 45 minutes.   I, Davy Pique, am acting as Location manager for Loyal Gambler, DO.  I have reviewed the above documentation for accuracy and completeness, and I agree with the above. Dell Ponto, DO

## 2022-08-11 ENCOUNTER — Ambulatory Visit (INDEPENDENT_AMBULATORY_CARE_PROVIDER_SITE_OTHER): Payer: Medicaid Other | Admitting: Family Medicine

## 2022-08-11 ENCOUNTER — Encounter (INDEPENDENT_AMBULATORY_CARE_PROVIDER_SITE_OTHER): Payer: Self-pay | Admitting: Family Medicine

## 2022-08-11 VITALS — BP 114/77 | HR 75 | Temp 97.7°F | Ht 66.0 in | Wt 276.0 lb

## 2022-08-11 DIAGNOSIS — E1169 Type 2 diabetes mellitus with other specified complication: Secondary | ICD-10-CM | POA: Diagnosis not present

## 2022-08-11 DIAGNOSIS — M1612 Unilateral primary osteoarthritis, left hip: Secondary | ICD-10-CM | POA: Diagnosis not present

## 2022-08-11 DIAGNOSIS — E66813 Obesity, class 3: Secondary | ICD-10-CM

## 2022-08-11 DIAGNOSIS — E669 Obesity, unspecified: Secondary | ICD-10-CM

## 2022-08-11 DIAGNOSIS — E559 Vitamin D deficiency, unspecified: Secondary | ICD-10-CM | POA: Diagnosis not present

## 2022-08-11 DIAGNOSIS — Z7985 Long-term (current) use of injectable non-insulin antidiabetic drugs: Secondary | ICD-10-CM

## 2022-08-11 DIAGNOSIS — Z6841 Body Mass Index (BMI) 40.0 and over, adult: Secondary | ICD-10-CM

## 2022-08-24 NOTE — Progress Notes (Unsigned)
Chief Complaint:   OBESITY [name] is here to discuss her progress with her obesity treatment plan along with follow-up of her obesity related diagnoses. [name] is on the Category 3 Plan and states she is following her eating plan approximately 90% of the time. [name] states she is walking 5,000 steps per day.   Today's visit was #: 2 Starting weight: 280 lbs Starting date: 07/29/2022 Today's weight: 276 lbs Today's date: 08/11/2022 Total lbs lost to date: 4 lbs Total lbs lost since last in-office visit: 4 lbs  Interim History: Had a dental extraction and could not eat meat for a few days.  She denies meal skipping.  She has increased her protein, fruits and veggies intake.  She has a good support system. She drinks more water and doing more steps.  She denies hunger or cravings.  She seldom eats out.   Subjective:   1. Vitamin D deficiency Discussed labs with patient today. She is currently taking prescription vitamin D 50,000 IU each week. She denies nausea, vomiting or muscle weakness.  Vitamin D level 82.  2. Type 2 diabetes mellitus with other specified complication, unspecified whether long term insulin use ([place]) Discussed labs with patient today. She is on Ozempic 0.5 mg weekly for about 1 year.  A1c 5.6, denies nausea, vomiting, or GERD.    3. Osteoarthritis of left hip, unspecified osteoarthritis type Discussed labs with patient today. She is working on BMI reduction <40 for hip replacement surgery.  She is able to do short walks.   Assessment/Plan:   1. Vitamin D deficiency Lab printout provided for patient to bring to PCP visit in November. Change Vitamin D 50,000 IU to every 14 days.   2. Type 2 diabetes mellitus with other specified complication, unspecified whether long term insulin use (HCC) Continue low sugar diet, plan to increase Ozempic to 1 mg dose after completing her current 3 months prescription.   3. Osteoarthritis of left hip, unspecified  osteoarthritis type Continue to exercise as tolerated and BMI reduction for future hip replacement.   4. Obesity,current BMI 44.7 Reviewed progress on Bioimpedance, she lost 8 lbs of body fat in 4 weeks.   [name] is currently in the action stage of change. As such, her goal is to continue with weight loss efforts. She has agreed to the Category 3 Plan.   Exercise goals:  Track steps daily.   Behavioral modification strategies: increasing lean protein intake, increasing vegetables, increasing water intake, no skipping meals, meal planning and cooking strategies, keeping healthy foods in the home, and decreasing junk food.  [name] has agreed to follow-up with our clinic in 2-3 weeks. She was informed of the importance of frequent follow-up visits to maximize her success with intensive lifestyle modifications for her multiple health conditions.   Objective:   Blood pressure 114/77, pulse 75, temperature 97.7 F (36.5 C), height '5\' 6"'$  (1.676 m), weight 276 lb (125.2 kg), SpO2 98 %. Body mass index is 44.55 kg/m.  General: Cooperative, alert, well developed, in no acute distress. HEENT: Conjunctivae and lids unremarkable. Cardiovascular: Regular rhythm.  Lungs: Normal work of breathing. Neurologic: No focal deficits.   Lab Results  Component Value Date   CREATININE 0.77 06/04/2022   BUN 6 (L) 06/04/2022   NA 141 06/04/2022   K 3.8 06/04/2022   CL 107 06/04/2022   CO2 32 06/04/2022   Lab Results  Component Value Date   ALT 24 06/04/2022   AST 22 06/04/2022  ALKPHOS 63 06/04/2022   BILITOT 0.3 06/04/2022   Lab Results  Component Value Date   HGBA1C 5.6 07/29/2022   HGBA1C 6.4 (H) 06/19/2013   HGBA1C 6.0 11/20/2009   Lab Results  Component Value Date   INSULIN 8.2 07/29/2022   Lab Results  Component Value Date   TSH 0.589 07/29/2022   Lab Results  Component Value Date   CHOL 129 07/29/2022   HDL 59 07/29/2022   LDLCALC 53 07/29/2022   TRIG 92 07/29/2022    CHOLHDL 2.2 07/29/2022   Lab Results  Component Value Date   VD25OH 82.9 07/29/2022   Lab Results  Component Value Date   WBC 5.6 06/04/2022   HGB 11.8 (L) 06/04/2022   HCT 34.3 (L) 06/04/2022   MCV 84.3 06/04/2022   PLT 259 06/04/2022   No results found for: "IRON", "TIBC", "FERRITIN"  Attestation Statements:   Reviewed by clinician on day of visit: allergies, medications, problem list, medical history, surgical history, family history, social history, and previous encounter notes.  I, Davy Pique, am acting as Location manager for Loyal Gambler, DO.  I have reviewed the above documentation for accuracy and completeness, and I agree with the above. Dell Ponto, DO

## 2022-09-08 ENCOUNTER — Ambulatory Visit (INDEPENDENT_AMBULATORY_CARE_PROVIDER_SITE_OTHER): Payer: Medicaid Other | Admitting: Family Medicine

## 2022-09-08 ENCOUNTER — Encounter (INDEPENDENT_AMBULATORY_CARE_PROVIDER_SITE_OTHER): Payer: Self-pay | Admitting: Family Medicine

## 2022-09-08 VITALS — BP 117/80 | HR 71 | Temp 97.9°F | Ht 66.0 in | Wt 274.0 lb

## 2022-09-08 DIAGNOSIS — E669 Obesity, unspecified: Secondary | ICD-10-CM

## 2022-09-08 DIAGNOSIS — Z6841 Body Mass Index (BMI) 40.0 and over, adult: Secondary | ICD-10-CM

## 2022-09-08 DIAGNOSIS — E559 Vitamin D deficiency, unspecified: Secondary | ICD-10-CM | POA: Diagnosis not present

## 2022-09-08 DIAGNOSIS — E1169 Type 2 diabetes mellitus with other specified complication: Secondary | ICD-10-CM

## 2022-09-08 DIAGNOSIS — M1612 Unilateral primary osteoarthritis, left hip: Secondary | ICD-10-CM

## 2022-09-08 DIAGNOSIS — Z7985 Long-term (current) use of injectable non-insulin antidiabetic drugs: Secondary | ICD-10-CM

## 2022-09-17 NOTE — Progress Notes (Signed)
Chief Complaint:   OBESITY [name] is here to discuss her progress with her obesity treatment plan along with follow-up of her obesity related diagnoses. [name] is on the Category 3 Plan and states she is following her eating plan approximately 90% of the time. [name] states she is not exercising.  Today's visit was #: 3 Starting weight: 280 lbs Starting date: 07/29/2022 Today's weight: 274 lbs Today's date: 09/08/2022 Total lbs lost to date: 6 lbs Total lbs lost since last in-office visit: 2 lbs  Interim History: Left hip DJD has been limiting exercise.  She has been tracking daily steps.  Her highest was 7000 each day and her lowest was 1000 steps each day.  She has downloaded a workout app she denies hunger or cravings on the category 3 meal plan.  Subjective:   1. Osteoarthritis of left hip, unspecified osteoarthritis type DJD limits walking some days.  She is actively working to reduce BMI to less than 40 for left hip arthroplasty.  2. Type 2 diabetes mellitus with other specified complication, unspecified whether long term insulin use ([place]) Patient is taking Ozempic 0.5 mg weekly per PCP without adverse side effects.  Last A1c was 5.6.  3. Vitamin D deficiency She is currently taking prescription vitamin D 50,000 IU each week. She denies nausea, vomiting or muscle weakness.  Last vitamin D level was 82.9 on 07/29/2022.  Assessment/Plan:   1. Osteoarthritis of left hip, unspecified osteoarthritis type Continue activity as tolerated and continue her BMI reduction.  2. Type 2 diabetes mellitus with other specified complication, unspecified whether long term insulin use (HCC) Continue Ozempic 0.5 mg weekly per PCP along with a low sugar diet.  3. Vitamin D deficiency Changed prescription vitamin D 50,000 IUs to every 14 days.  Recheck level in February.  4. Obesity,current BMI 44.3 Adding home exercises to help increase exercise time.  [name] is currently in  the action stage of change. As such, her goal is to continue with weight loss efforts. She has agreed to the Category 3 Plan.   Exercise goals: All adults should avoid inactivity. Some physical activity is better than none, and adults who participate in any amount of physical activity gain some health benefits.  Behavioral modification strategies: increasing lean protein intake, increasing vegetables, increasing water intake, decreasing eating out, no skipping meals, meal planning and cooking strategies, keeping healthy foods in the home, and decreasing junk food.  [name] has agreed to follow-up with our clinic in 3-4 weeks. She was informed of the importance of frequent follow-up visits to maximize her success with intensive lifestyle modifications for her multiple health conditions.   Objective:   Blood pressure 117/80, pulse 71, temperature 97.9 F (36.6 C), height '5\' 6"'$  (1.676 m), weight 274 lb (124.3 kg), SpO2 98 %. Body mass index is 44.22 kg/m.  General: Cooperative, alert, well developed, in no acute distress. HEENT: Conjunctivae and lids unremarkable. Cardiovascular: Regular rhythm.  Lungs: Normal work of breathing. Neurologic: No focal deficits.   Lab Results  Component Value Date   CREATININE 0.77 06/04/2022   BUN 6 (L) 06/04/2022   NA 141 06/04/2022   K 3.8 06/04/2022   CL 107 06/04/2022   CO2 32 06/04/2022   Lab Results  Component Value Date   ALT 24 06/04/2022   AST 22 06/04/2022   ALKPHOS 63 06/04/2022   BILITOT 0.3 06/04/2022   Lab Results  Component Value Date   HGBA1C 5.6 07/29/2022   HGBA1C 6.4 (H)  06/19/2013   HGBA1C 6.0 11/20/2009   Lab Results  Component Value Date   INSULIN 8.2 07/29/2022   Lab Results  Component Value Date   TSH 0.589 07/29/2022   Lab Results  Component Value Date   CHOL 129 07/29/2022   HDL 59 07/29/2022   LDLCALC 53 07/29/2022   TRIG 92 07/29/2022   CHOLHDL 2.2 07/29/2022   Lab Results  Component Value Date    VD25OH 82.9 07/29/2022   Lab Results  Component Value Date   WBC 5.6 06/04/2022   HGB 11.8 (L) 06/04/2022   HCT 34.3 (L) 06/04/2022   MCV 84.3 06/04/2022   PLT 259 06/04/2022   No results found for: "IRON", "TIBC", "FERRITIN"  Attestation Statements:   Reviewed by clinician on day of visit: allergies, medications, problem list, medical history, surgical history, family history, social history, and previous encounter notes.  I, Davy Pique, am acting as Location manager for Loyal Gambler, DO.  I have reviewed the above documentation for accuracy and completeness, and I agree with the above. Dell Ponto, DO

## 2022-09-29 ENCOUNTER — Ambulatory Visit (INDEPENDENT_AMBULATORY_CARE_PROVIDER_SITE_OTHER): Payer: Medicaid Other | Admitting: Family Medicine

## 2022-10-19 ENCOUNTER — Encounter (INDEPENDENT_AMBULATORY_CARE_PROVIDER_SITE_OTHER): Payer: Self-pay | Admitting: Family Medicine

## 2022-10-19 ENCOUNTER — Ambulatory Visit (INDEPENDENT_AMBULATORY_CARE_PROVIDER_SITE_OTHER): Payer: Medicaid Other | Admitting: Family Medicine

## 2022-10-19 VITALS — BP 111/72 | HR 63 | Temp 98.1°F | Ht 66.0 in | Wt 270.0 lb

## 2022-10-19 DIAGNOSIS — E1169 Type 2 diabetes mellitus with other specified complication: Secondary | ICD-10-CM | POA: Diagnosis not present

## 2022-10-19 DIAGNOSIS — E668 Other obesity: Secondary | ICD-10-CM | POA: Diagnosis not present

## 2022-10-19 DIAGNOSIS — Z7985 Long-term (current) use of injectable non-insulin antidiabetic drugs: Secondary | ICD-10-CM

## 2022-10-19 DIAGNOSIS — Z6841 Body Mass Index (BMI) 40.0 and over, adult: Secondary | ICD-10-CM

## 2022-10-19 DIAGNOSIS — E65 Localized adiposity: Secondary | ICD-10-CM

## 2022-10-20 DIAGNOSIS — E65 Localized adiposity: Secondary | ICD-10-CM | POA: Insufficient documentation

## 2022-11-02 NOTE — Progress Notes (Signed)
Chief Complaint:   OBESITY [name] is here to discuss her progress with her obesity treatment plan along with follow-up of her obesity related diagnoses. [name] is on the Category 3 Plan and states she is following her eating plan approximately 75% of the time. [name] states she is walking 7,000-10,000 30 minutes 3 times per week.  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  Motivated to stay on track.  Patient gained 0.8 lb of muscle gained and lost 4.8 lb body fat.  Has a good support system.  Satiety improved on Ozempic.  Has a good support system.   Subjective:   1. Type 2 diabetes mellitus with other specified complication, without long-term current use of insulin (HCC) Patient is on Ozempic 0.5 mg weekly injection.  Has improved slightly with GI side effects.  Last A1c 07/29/2022, was 5.6.  2. Visceral Adiposity Visceral fat rating 17, down from 19.    Actively creating a calorie deficit for weight loss.  Assessment/Plan:   1. Type 2 diabetes mellitus with other specified complication, without long-term current use of insulin (HCC) Continue active plan for weight reduction and work on a low sugar diet with more consistent exercise.   2. Visceral Adiposity Continue to work on a low calorie and higher protein diet. Aiming for a visceral fat rating of <10.   3. Obesity,current BMI 43.6 Increase walking time.  Discussed indoor walking options for winter.   [name] is currently in the action stage of change. As such, her goal is to continue with weight loss efforts. She has agreed to the Category 3 Plan.   Exercise goals: All adults should avoid inactivity. Some physical activity is better than none, and adults who participate in  any amount of physical activity gain some health benefits.  Behavioral modification strategies: increasing lean protein intake, increasing water intake, decreasing eating out, no skipping meals, meal planning and cooking strategies, keeping healthy foods in the home, and planning for success.  [name] has agreed to follow-up with our clinic in 4 weeks. She was informed of the importance of frequent follow-up visits to maximize her success with intensive lifestyle modifications for her multiple health conditions.   Objective:   Blood pressure 111/72, pulse 63, temperature 98.1 F (36.7 C), height '5\' 6"'$  (1.676 m), weight 270 lb (122.5 kg), SpO2 99 %. Body mass index is 43.58 kg/m.  General: Cooperative, alert, well developed, in no acute distress. HEENT: Conjunctivae and lids unremarkable. Cardiovascular: Regular rhythm.  Lungs: Normal work of breathing. Neurologic: No focal deficits.   Lab Results  Component Value Date   CREATININE 0.77 06/04/2022   BUN 6 (L) 06/04/2022   NA 141 06/04/2022   K 3.8 06/04/2022   CL 107 06/04/2022   CO2 32 06/04/2022   Lab Results  Component Value Date   ALT 24 06/04/2022   AST 22 06/04/2022   ALKPHOS 63 06/04/2022   BILITOT 0.3 06/04/2022   Lab Results  Component Value Date   HGBA1C 5.6 07/29/2022   HGBA1C 6.4 (H) 06/19/2013   HGBA1C 6.0 11/20/2009   Lab Results  Component Value Date   INSULIN 8.2 07/29/2022   Lab Results  Component Value Date   TSH 0.589 07/29/2022   Lab  Results  Component Value Date   CHOL 129 07/29/2022   HDL 59 07/29/2022   LDLCALC 53 07/29/2022   TRIG 92 07/29/2022   CHOLHDL 2.2 07/29/2022   Lab Results  Component Value Date   VD25OH 82.9 07/29/2022   Lab Results  Component Value Date   WBC 5.6 06/04/2022   HGB 11.8 (L) 06/04/2022   HCT 34.3 (L) 06/04/2022   MCV 84.3 06/04/2022   PLT 259 06/04/2022   No results found for: "IRON", "TIBC", "FERRITIN"  Attestation Statements:   Reviewed by  clinician on day of visit: allergies, medications, problem list, medical history, surgical history, family history, social history, and previous encounter notes.  I, Davy Pique, am acting as Location manager for Loyal Gambler, DO.  I have reviewed the above documentation for accuracy and completeness, and I agree with the above. Dell Ponto, DO

## 2022-11-19 ENCOUNTER — Ambulatory Visit (INDEPENDENT_AMBULATORY_CARE_PROVIDER_SITE_OTHER): Payer: Medicaid Other | Admitting: Family Medicine

## 2022-11-19 ENCOUNTER — Encounter (INDEPENDENT_AMBULATORY_CARE_PROVIDER_SITE_OTHER): Payer: Self-pay | Admitting: Family Medicine

## 2022-11-19 VITALS — BP 102/67 | HR 63 | Temp 97.9°F | Ht 66.0 in | Wt 272.0 lb

## 2022-11-19 DIAGNOSIS — Z6841 Body Mass Index (BMI) 40.0 and over, adult: Secondary | ICD-10-CM

## 2022-11-19 DIAGNOSIS — Z7985 Long-term (current) use of injectable non-insulin antidiabetic drugs: Secondary | ICD-10-CM

## 2022-11-19 DIAGNOSIS — E1169 Type 2 diabetes mellitus with other specified complication: Secondary | ICD-10-CM | POA: Diagnosis not present

## 2022-11-19 DIAGNOSIS — I10 Essential (primary) hypertension: Secondary | ICD-10-CM | POA: Diagnosis not present

## 2022-12-04 ENCOUNTER — Ambulatory Visit: Payer: Medicaid Other | Admitting: Physician Assistant

## 2022-12-04 ENCOUNTER — Other Ambulatory Visit: Payer: Medicaid Other

## 2022-12-07 NOTE — Progress Notes (Signed)
Chief Complaint:   OBESITY [name] is here to discuss her progress with her obesity treatment plan along with follow-up of her obesity related diagnoses. [name] is on the Category 3 Plan and states she is following her eating plan approximately 75% of the time. [name] states she is not exercising.  Today's visit was #: 5 Starting weight: 280 LBS Starting date: 07/29/2022 Today's weight: 272 LBS Today's date: 11/19/2022 Total lbs lost to date: 8 LBS Total lbs lost since last in-office visit: +2 LBS  Interim History: Patient had to cancel her flight to the [place]  She has been babysitting her grandbaby with less time for self.  She is eating more grapes.  She is not exercising.  She plans to walk outside more.  She is getting protein and fiber with meals.  Some sugar cravings.  Patient has a good support system.  Subjective:   1. Type 2 diabetes mellitus with other specified complication, without long-term current use of insulin (HCC) Patient takes Ozempic 0.5 mg weekly.  Last A1c 5.1.  Patient denies GI upset, AM glucoses are in the 90s.  2. Essential hypertension Blood pressure low normal today.  Complains of fatigue.  Patient takes lisinopril 5 mg daily and Toprol XL 50 mg daily.  Patient has PCP visit in March.   Assessment/Plan:   1. Type 2 diabetes mellitus with other specified complication, without long-term current use of insulin (HCC) Continue Ozempic 0.5 mg weekly through PCP and prescribed diet.  2. Essential hypertension Talk to PCP about reducing blood pressure medication with healthy lifestyle changes and lower blood pressure readings.  3. OBESITY, MORBID  4. Obesity,current BMI 43.9 1.  Track steps daily, 10,000 steps per day. 2.  Reduce intake of high sugar fruits.  [name] is currently in the action stage of change. As such, her goal is to continue with weight loss efforts. She has agreed to the Category 3 Plan.   Exercise goals: All  adults should avoid inactivity. Some physical activity is better than none, and adults who participate in any amount of physical activity gain some health benefits.  Behavioral modification strategies: increasing lean protein intake, decreasing simple carbohydrates, increasing vegetables, increasing water intake, decreasing eating out, no skipping meals, meal planning and cooking strategies, keeping healthy foods in the home, and planning for success.  [name] has agreed to follow-up with our clinic in 4 weeks. She was informed of the importance of frequent follow-up visits to maximize her success with intensive lifestyle modifications for her multiple health conditions.   Objective:   Blood pressure 102/67, pulse 63, temperature 97.9 F (36.6 C), height '5\' 6"'$  (1.676 m), weight 272 lb (123.4 kg), SpO2 94 %. Body mass index is 43.9 kg/m.  General: Cooperative, alert, well developed, in no acute distress. HEENT: Conjunctivae and lids unremarkable. Cardiovascular: Regular rhythm.  Lungs: Normal work of breathing. Neurologic: No focal deficits.   Lab Results  Component Value Date   CREATININE 0.77 06/04/2022   BUN 6 (L) 06/04/2022   NA 141 06/04/2022   K 3.8 06/04/2022   CL 107 06/04/2022   CO2 32 06/04/2022   Lab Results  Component Value Date   ALT 24 06/04/2022   AST 22 06/04/2022   ALKPHOS 63 06/04/2022   BILITOT 0.3 06/04/2022   Lab Results  Component Value Date   HGBA1C 5.6 07/29/2022   HGBA1C 6.4 (H) 06/19/2013   HGBA1C 6.0 11/20/2009   Lab Results  Component Value Date  INSULIN 8.2 07/29/2022   Lab Results  Component Value Date   TSH 0.589 07/29/2022   Lab Results  Component Value Date   CHOL 129 07/29/2022   HDL 59 07/29/2022   LDLCALC 53 07/29/2022   TRIG 92 07/29/2022   CHOLHDL 2.2 07/29/2022   Lab Results  Component Value Date   VD25OH 82.9 07/29/2022   Lab Results  Component Value Date   WBC 5.6 06/04/2022   HGB 11.8 (L) 06/04/2022   HCT 34.3  (L) 06/04/2022   MCV 84.3 06/04/2022   PLT 259 06/04/2022   No results found for: "IRON", "TIBC", "FERRITIN"  Attestation Statements:   Reviewed by clinician on day of visit: allergies, medications, problem list, medical history, surgical history, family history, social history, and previous encounter notes.  I have personally spent 30 minutes total time today in preparation, patient care, nutritional counseling and documentation for this visit, including the following: review of clinical lab tests; review of medical tests/procedures/services.    I, Davy Pique, am acting as Location manager for Loyal Gambler, DO.  I have reviewed the above documentation for accuracy and completeness, and I agree with the above. Dell Ponto, DO

## 2022-12-15 ENCOUNTER — Ambulatory Visit (INDEPENDENT_AMBULATORY_CARE_PROVIDER_SITE_OTHER): Payer: Medicaid Other | Admitting: Family Medicine

## 2022-12-15 ENCOUNTER — Encounter (INDEPENDENT_AMBULATORY_CARE_PROVIDER_SITE_OTHER): Payer: Self-pay | Admitting: Family Medicine

## 2022-12-15 VITALS — BP 109/73 | HR 66 | Temp 98.8°F | Ht 66.0 in | Wt 269.0 lb

## 2022-12-15 DIAGNOSIS — E1169 Type 2 diabetes mellitus with other specified complication: Secondary | ICD-10-CM

## 2022-12-15 DIAGNOSIS — E65 Localized adiposity: Secondary | ICD-10-CM | POA: Diagnosis not present

## 2022-12-15 DIAGNOSIS — M160 Bilateral primary osteoarthritis of hip: Secondary | ICD-10-CM | POA: Diagnosis not present

## 2022-12-15 DIAGNOSIS — Z7985 Long-term (current) use of injectable non-insulin antidiabetic drugs: Secondary | ICD-10-CM

## 2022-12-15 DIAGNOSIS — Z6841 Body Mass Index (BMI) 40.0 and over, adult: Secondary | ICD-10-CM

## 2022-12-15 NOTE — Assessment & Plan Note (Signed)
Net weight loss 11 pounds in 4 months medically supervised weight management.  Obesity improving and reviewed bioimpedance results today. 24-hour verbal food recall discussed.  Aim for 100 to 110 g of dietary protein intake daily.

## 2022-12-15 NOTE — Assessment & Plan Note (Addendum)
Lab Results  Component Value Date   HGBA1C 5.6 07/29/2022   She is doing well on Ozempic 0.5 mg once weekly injection prescribed at her PCP.  She has been happy with her blood glucose readings.  She has reduced her intake of added sugar and refined carbohydrates.  She has seen a net weight loss of 11 pounds over the past 4 months of medically supervised weight management.  Continue prescribed dietary plan

## 2022-12-15 NOTE — Assessment & Plan Note (Signed)
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  She has been able to increase her steps to 8000/day.  She plans to meet back with orthopedics in the coming months to discuss hip replacement surgery.  She plans to do the left hip before the right hip.  Continue active plan for weight reduction in preparation for hip replacement surgery.

## 2022-12-15 NOTE — Progress Notes (Signed)
Office: [phone]  /  Fax: [phone]  WEIGHT SUMMARY AND BIOMETRICS  Vitals Temp: 98.8 F (37.1 C) BP: 109/73 Pulse Rate: 66 SpO2: 97 %   Anthropometric Measurements Height: '5\' 6"'$  (1.676 m) Weight: 269 lb (122 kg) BMI (Calculated): 43.44 Weight at Last Visit: 272lb Weight Lost Since Last Visit: 3lb Starting Weight: 280lb Total Weight Loss (lbs): 11 lb (4.99 kg)   Body Composition  Body Fat %: 49.7 % Fat Mass (lbs): 133.8 lbs Muscle Mass (lbs): 128.6 lbs Total Body Water (lbs): 90.2 lbs Visceral Fat Rating : 17   Other Clinical Data Fasting: no Labs: no Today's Visit #: 6 Starting Date: 07/29/22     HPI  Chief Complaint: OBESITY  [name] is here to discuss her progress with her obesity treatment plan. She is on the the Category 3 Plan and states she is following her eating plan approximately 50 % of the time. She states she is exercising 45 minutes 3 times per week.   Interval History:  Since last office visit she is down 3 lb She had a partial dental extraction ( lower ) 2 weeks ago She is tracking her steps and doing some home exercises; averaging 8,00 steps/ day (increased) Bilateral hip DJD is limiting sleep at movement.  Plans to get L THR first- seeing Dr Durward Fortes Feeling adequate satiety from Ozempic 0.5 mg She has lost 11 lb in the past 4 mos-lost 2 pounds of muscle mass Denies excess hunger or cravings  Reviewed high-protein/lower calorie/higher fiber soft food options due to recent dental extraction  Pharmacotherapy: Ozempic 0.5 mg (PCP)  PHYSICAL EXAM:  Blood pressure 109/73, pulse 66, temperature 98.8 F (37.1 C), height '5\' 6"'$  (1.676 m), weight 269 lb (122 kg), SpO2 97 %. Body mass index is 43.42 kg/m.  General: She is overweight, cooperative, alert, well developed, and in no acute distress. PSYCH: Has normal mood, affect and thought process.   Lungs: Normal breathing effort, no conversational dyspnea.  DIAGNOSTIC DATA  REVIEWED:  BMET    Component Value Date/Time   NA 141 06/04/2022 0821   K 3.8 06/04/2022 0821   CL 107 06/04/2022 0821   CO2 32 06/04/2022 0821   GLUCOSE 103 (H) 06/04/2022 0821   BUN 6 (L) 06/04/2022 0821   CREATININE 0.77 06/04/2022 0821   CREATININE 1.06 (H) 04/18/2021 0905   CALCIUM 9.2 06/04/2022 0821   GFRNONAA >60 06/04/2022 0821   GFRNONAA 57 (L) 04/18/2021 0905   GFRAA 66 04/18/2021 0905   Lab Results  Component Value Date   HGBA1C 5.6 07/29/2022   HGBA1C 6.0 11/20/2009   Lab Results  Component Value Date   INSULIN 8.2 07/29/2022   Lab Results  Component Value Date   TSH 0.589 07/29/2022   CBC    Component Value Date/Time   WBC 5.6 06/04/2022 0821   WBC 5.5 04/18/2021 0905   RBC 4.07 06/04/2022 0821   HGB 11.8 (L) 06/04/2022 0821   HCT 34.3 (L) 06/04/2022 0821   PLT 259 06/04/2022 0821   MCV 84.3 06/04/2022 0821   MCH 29.0 06/04/2022 0821   MCHC 34.4 06/04/2022 0821   RDW 14.0 06/04/2022 0821   Iron Studies No results found for: "IRON", "TIBC", "FERRITIN", "IRONPCTSAT" Lipid Panel     Component Value Date/Time   CHOL 129 07/29/2022 0930   TRIG 92 07/29/2022 0930   HDL 59 07/29/2022 0930   CHOLHDL 2.2 07/29/2022 0930   CHOLHDL 5.0 Ratio 10/07/2009 1913   VLDL 38 10/07/2009 1913  LDLCALC 53 07/29/2022 0930   Hepatic Function Panel     Component Value Date/Time   PROT 7.2 06/04/2022 0821   ALBUMIN 4.0 06/04/2022 0821   AST 22 06/04/2022 0821   ALT 24 06/04/2022 0821   ALKPHOS 63 06/04/2022 0821   BILITOT 0.3 06/04/2022 0821   BILIDIR 0.1 11/26/2010 2026   IBILI 0.2 11/26/2010 2026      Component Value Date/Time   TSH 0.589 07/29/2022 0930   Nutritional Lab Results  Component Value Date   VD25OH 82.9 07/29/2022     ASSESSMENT AND PLAN  TREATMENT PLAN FOR OBESITY:  Recommended Dietary Goals  Shamarah is currently in the action stage of change. As such, her goal is to continue weight management plan. She has agreed to the  Category 3 Plan.  Behavioral Intervention  We discussed the following Behavioral Modification Strategies today: increasing lean protein intake, increasing vegetables, increasing fiber rich foods, increasing water intake, work on meal planning and easy cooking plans, and decreasing eating out, consumption of processed foods, and making healthy choices when eating convenient foods.  Additional resources provided today: NA  Recommended Physical Activity Goals  Maudine has been advised to work up to 150 minutes of moderate intensity aerobic activity a week and strengthening exercises 2-3 times per week for cardiovascular health, weight loss maintenance and preservation of muscle mass.   She has agreed to increase physical activity in their day and reduce sedentary time (increase NEAT).    Pharmacotherapy We discussed various medication options to help [place] with her weight loss efforts and we both agreed to none.  ASSOCIATED CONDITIONS ADDRESSED TODAY  Type 2 diabetes mellitus with other specified complication, without long-term current use of insulin [facility]) Assessment & Plan: Lab Results  Component Value Date   HGBA1C 5.6 07/29/2022   She is doing well on Ozempic 0.5 mg once weekly injection prescribed at her PCP.  She has been happy with her blood glucose readings.  She has reduced her intake of added sugar and refined carbohydrates.  She has seen a net weight loss of 11 pounds over the past 4 months of medically supervised weight management.  Continue prescribed dietary plan   Visceral Adiposity  Primary osteoarthritis of both hips Assessment & Plan: Bilateral hip DJD has impaired exercise capacity.  She has been able to increase her steps to 8000/day.  She plans to meet back with orthopedics in the coming months to discuss hip replacement surgery.  She plans to do the left hip before the right hip.  Continue active plan for weight reduction in preparation for hip replacement  surgery.   OBESITY, MORBID Assessment & Plan: Net weight loss 11 pounds in 4 months medically supervised weight management.  Obesity improving and reviewed bioimpedance results today. 24-hour verbal food recall discussed.  Aim for 100 to 110 g of dietary protein intake daily.   BMI 40.0-44.9, adult ([place])      Return in about 4 weeks (around 01/12/2023).Marland Kitchen She was informed of the importance of frequent follow up visits to maximize her success with intensive lifestyle modifications for her multiple health conditions.   ATTESTASTION STATEMENTS:  Reviewed by clinician on day of visit: allergies, medications, problem list, medical history, surgical history, family history, social history, and previous encounter notes pertinent to obesity diagnosis.   I have personally spent 30 minutes total time today in preparation, patient care, nutritional counseling and documentation for this visit, including the following: review of clinical lab tests; review of medical tests/procedures/services.  Dell Ponto, DO

## 2022-12-25 ENCOUNTER — Other Ambulatory Visit: Payer: Self-pay

## 2022-12-25 ENCOUNTER — Inpatient Hospital Stay (HOSPITAL_BASED_OUTPATIENT_CLINIC_OR_DEPARTMENT_OTHER): Payer: Medicaid Other | Admitting: Physician Assistant

## 2022-12-25 ENCOUNTER — Other Ambulatory Visit: Payer: Self-pay | Admitting: Physician Assistant

## 2022-12-25 ENCOUNTER — Inpatient Hospital Stay: Payer: Medicaid Other | Attending: Physician Assistant

## 2022-12-25 VITALS — BP 109/94 | HR 65 | Temp 97.3°F | Resp 14 | Wt 268.3 lb

## 2022-12-25 DIAGNOSIS — Z79899 Other long term (current) drug therapy: Secondary | ICD-10-CM | POA: Insufficient documentation

## 2022-12-25 DIAGNOSIS — Z86718 Personal history of other venous thrombosis and embolism: Secondary | ICD-10-CM | POA: Insufficient documentation

## 2022-12-25 DIAGNOSIS — D6861 Antiphospholipid syndrome: Secondary | ICD-10-CM | POA: Insufficient documentation

## 2022-12-25 DIAGNOSIS — R760 Raised antibody titer: Secondary | ICD-10-CM | POA: Diagnosis not present

## 2022-12-25 DIAGNOSIS — Z7901 Long term (current) use of anticoagulants: Secondary | ICD-10-CM | POA: Diagnosis not present

## 2022-12-25 LAB — CMP (CANCER CENTER ONLY)
ALT: 24 U/L (ref 0–44)
AST: 22 U/L (ref 15–41)
Albumin: 4.1 g/dL (ref 3.5–5.0)
Alkaline Phosphatase: 68 U/L (ref 38–126)
Anion gap: 4 — ABNORMAL LOW (ref 5–15)
BUN: 15 mg/dL (ref 8–23)
CO2: 31 mmol/L (ref 22–32)
Calcium: 9.4 mg/dL (ref 8.9–10.3)
Chloride: 106 mmol/L (ref 98–111)
Creatinine: 1 mg/dL (ref 0.44–1.00)
GFR, Estimated: >60 mL/min (ref >60)
Glucose, Bld: 121 mg/dL — ABNORMAL HIGH (ref 70–99)
Potassium: 3.9 mmol/L (ref 3.5–5.1)
Sodium: 141 mmol/L (ref 135–145)
Total Bilirubin: 0.5 mg/dL (ref 0.3–1.2)
Total Protein: 7.8 g/dL (ref 6.5–8.1)

## 2022-12-25 LAB — CBC WITH DIFFERENTIAL (CANCER CENTER ONLY)
Abs Immature Granulocytes: 0.02 10*3/uL (ref 0.00–0.07)
Basophils Absolute: 0.1 10*3/uL (ref 0.0–0.1)
Basophils Relative: 1 %
Eosinophils Absolute: 0.2 10*3/uL (ref 0.0–0.5)
Eosinophils Relative: 3 %
HCT: 37.3 % (ref 36.0–46.0)
Hemoglobin: 12.8 g/dL (ref 12.0–15.0)
Immature Granulocytes: 0 %
Lymphocytes Relative: 46 %
Lymphs Abs: 3.3 10*3/uL (ref 0.7–4.0)
MCH: 29.5 pg (ref 26.0–34.0)
MCHC: 34.3 g/dL (ref 30.0–36.0)
MCV: 85.9 fL (ref 80.0–100.0)
Monocytes Absolute: 0.5 10*3/uL (ref 0.1–1.0)
Monocytes Relative: 7 %
Neutro Abs: 3.1 10*3/uL (ref 1.7–7.7)
Neutrophils Relative %: 43 %
Platelet Count: 268 10*3/uL (ref 150–400)
RBC: 4.34 MIL/uL (ref 3.87–5.11)
RDW: 13.4 % (ref 11.5–15.5)
WBC Count: 7.2 10*3/uL (ref 4.0–10.5)
nRBC: 0 % (ref 0.0–0.2)

## 2022-12-25 NOTE — Progress Notes (Signed)
[name] [phone]   [phone]  PROGRESS NOTE  Patient Care Team: Riki Sheer, NP as PCP - General (Nurse Practitioner) Terance Ice, MD (Inactive) as Referring Physician (Cardiology)  Hematological/Oncological History  #Elevated anticardiolipin and beta-2 glycoprotein antibodies # Antiphospholipid Antibody Syndrome  1) 11/29/2010: Left Leg Doppler US after patient presented with pain and swelling in left lower extremity. Findings revealed non occlusive acute DVT involving the posterior tibial and popliteal veins of the left lower extremity. Treated with lovenox bridge and then transitioned to coumadin. Patient reports taking coumadin for two years.   2) 04/18/2021: Anticardiolipin IgG >112.0, Beta-2 Glycoprotein IgG 103.5  3) 05/20/2021: Establish care with Dede Query PA-C  HISTORY OF PRESENTING ILLNESS:  [name] 62 y.o. female returns for follow-up for elevated antiphospholipid antibodies. She is unaccompanied for this visit. She was last seen on 06/04/2022 without any changes to her health in the interim.   On exam today, [name] is doing well and has no she is trying to stay active and exercise.  She lost 24 pounds since the last visit.  She denies any signs or symptoms of recurrent VTE.  This includes leg swelling, leg pain, chest pain or shortness of breath.  She adds that she is planning to undergo hip replacement in the near future.  Overall she feels well and denies any fevers, chills, sweats, nausea, vomiting or diarrhea.  Full 10 point ROS is listed below. MEDICAL HISTORY:  Past Medical History:  Diagnosis Date   Anemia    Anxiety    Arthritis    Back pain    Chest pain    Diabetes mellitus without complication ([place])    recent dx diet controlled   DVT (deep venous thrombosis) (HCC)    remote - took coumadin   Dysmenorrhea    Dysrhythmia    irregular heart beat   Edema    Fatty liver    Fibroid     GERD (gastroesophageal reflux disease)    hx of   Gout    Hypercholesteremia    Hypertension 4 months   Hypothyroid    Joint pain    Menometrorrhagia    Morbid obesity ([place])    Osteoarthritis    Palpitations    Prediabetes    Substance abuse ([place])    Vitamin D deficiency     SURGICAL HISTORY: Past Surgical History:  Procedure Laterality Date   ABDOMINAL HYSTERECTOMY  2008   BREAST BIOPSY Left 2013   benign   BUNIONECTOMY Bilateral    CHOLECYSTECTOMY     COLONOSCOPY WITH PROPOFOL N/A 10/27/2018   Procedure: COLONOSCOPY WITH PROPOFOL;  Surgeon: Wonda Horner, MD;  Location: [facility];  Service: Endoscopy;  Laterality: N/A;   ECTOPIC PREGNANCY SURGERY  1980's   MANDIBLE OSTEOTOMY Bilateral 07/03/2013   Procedure: BILATERAL TORI;  Surgeon: Gae Bon, DDS;  Location: [facility];  Service: Oral Surgery;  Laterality: Bilateral;   NM MYOCAR PERF WALL MOTION  07/2012   lexiscan - normal pattern of perfusion, low risk   ROTATOR CUFF REPAIR Bilateral 2009   SLEEP STUDY  07/27/2012   AHI 4.8/hr   TOOTH EXTRACTION N/A 07/03/2013   Procedure: DENTAL EXTRACTION # 20;  Surgeon: Gae Bon, DDS;  Location: [facility];  Service: Oral Surgery;  Laterality: N/A;   TRANSTHORACIC ECHOCARDIOGRAM  07/2012   EF=>55%, mod conc LVH; LA mod dilated; trace MR; mild TR with normal RVSP; trace AV regurg   TUBAL  LIGATION      SOCIAL HISTORY: Social History   Socioeconomic History   Marital status: Single    Spouse name: Not on file   Number of children: 6   Years of education: 11   Highest education level: Not on file  Occupational History    Employer: UNEMPLOYED    Comment: Disability  Tobacco Use   Smoking status: Former    Packs/day: 0.10    Years: 6.00    Additional pack years: 0.00    Total pack years: 0.60    Types: Cigarettes    Quit date: 02/14/2011    Years since quitting: 11.8   Smokeless tobacco: Never  Vaping Use   Vaping Use: Never used  Substance and Sexual Activity    Alcohol use: No    Comment: Previous ETOH abuse   Drug use: No    Comment: Previous crack use    Sexual activity: Never    Birth control/protection: Abstinence  Other Topics Concern   Not on file  Social History Narrative   Not on file   Social Determinants of Health   Financial Resource Strain: Not on file  Food Insecurity: Not on file  Transportation Needs: Not on file  Physical Activity: Not on file  Stress: Not on file  Social Connections: Not on file  Intimate Partner Violence: Not on file    FAMILY HISTORY: Family History  Problem Relation Age of Onset   Thyroid disease Mother    Hyperlipidemia Mother    Diabetes Mother    Hypertension Mother    Cancer Mother    Deep vein thrombosis Mother        and PE   Heart disease Mother    Kidney disease Mother    Diabetes Father    Hypertension Father    Cancer Father 68   Throat cancer Brother 6   Pulmonary embolism Daughter    Heart disease Other        No family history    ALLERGIES:  is allergic to buprenorphine hcl-naloxone hcl, naloxone, and dilaudid [hydromorphone hcl].  MEDICATIONS:  Current Outpatient Medications  Medication Sig Dispense Refill   allopurinol (ZYLOPRIM) 100 MG tablet Take 100 mg by mouth daily.     aspirin 81 MG chewable tablet Chew 81 mg by mouth daily.     atorvastatin (LIPITOR) 40 MG tablet Take 40 mg by mouth daily.     Blood Glucose Monitoring Suppl (ACCU-CHEK GUIDE ME) w/Device KIT      cholecalciferol (VITAMIN D3) 25 MCG (1000 UT) tablet Take 2,000 Units by mouth daily.     cyclobenzaprine (FLEXERIL) 10 MG tablet Take 10 mg by mouth 2 (two) times daily as needed for muscle spasms.      diclofenac sodium (VOLTAREN) 1 % GEL Apply 4 g topically 4 (four) times daily. 100 g 0   furosemide (LASIX) 20 MG tablet Take 20 mg by mouth daily.     glucose blood test strip TEST GLUCOSE TWICE A DAY     levothyroxine (SYNTHROID) 75 MCG tablet Take 75 mcg by mouth daily.     levothyroxine  (SYNTHROID) 75 MCG tablet Take 1 tablet by mouth daily.     lidocaine (XYLOCAINE) 5 % ointment Apply 1 application topically as needed for moderate pain.     lisinopril (ZESTRIL) 5 MG tablet Take 1 tablet by mouth daily.     MELATONIN PO Take by mouth at bedtime as needed.     metoprolol succinate (TOPROL-XL) 50  MG 24 hr tablet Take 50 mg by mouth daily. Take with or immediately following a meal.     naloxegol oxalate (MOVANTIK) 25 MG TABS tablet Take 25 mg by mouth daily.      NARCAN 4 MG/0.1ML LIQD nasal spray kit SMARTSIG:1 Spray(s) Both Nares Once PRN     oxyCODONE-acetaminophen (PERCOCET) 10-325 MG tablet Take 1 tablet by mouth 4 (four) times daily.      OZEMPIC, 0.25 OR 0.5 MG/DOSE, 2 MG/1.5ML SOPN Inject into the skin.     potassium chloride SA (K-DUR,KLOR-CON) 20 MEQ tablet Take 20 mEq by mouth daily.     Vitamin D, Ergocalciferol, (DRISDOL) 1.25 MG (50000 UNIT) CAPS capsule Take 50,000 Units by mouth every 14 (fourteen) days.     No current facility-administered medications for this visit.    REVIEW OF SYSTEMS:   Constitutional: ( - ) fevers, ( - )  chills , ( - ) night sweats Eyes: ( - ) blurriness of vision, ( - ) double vision, ( - ) watery eyes Ears, nose, mouth, throat, and face: ( - ) mucositis, ( - ) sore throat Respiratory: ( - ) cough, ( - ) dyspnea, ( - ) wheezes Cardiovascular: ( - ) palpitation, ( - ) chest discomfort, ( - ) lower extremity swelling Gastrointestinal:  ( - ) nausea, ( - ) heartburn, ( - ) change in bowel habits Skin: ( - ) abnormal skin rashes Lymphatics: ( - ) new lymphadenopathy, ( - ) easy bruising Neurological: ( - ) numbness, ( - ) tingling, ( - ) new weaknesses Behavioral/Psych: ( - ) mood change, ( - ) new changes  All other systems were reviewed with the patient and are negative.  PHYSICAL EXAMINATION: ECOG PERFORMANCE STATUS: 0 - Asymptomatic  Vitals:   12/25/22 0824  BP: (!) 109/94  Pulse: 65  Resp: 14  Temp: (!) 97.3 F (36.3 C)   SpO2: 99%     Filed Weights   12/25/22 0824  Weight: 268 lb 4.8 oz (121.7 kg)     GENERAL: well appearing female in NAD  SKIN: skin color, texture, turgor are normal, no rashes or significant lesions EYES: conjunctiva are pink and non-injected, sclera clear OROPHARYNX: no exudate, no erythema; lips, buccal mucosa, and tongue normal  LUNGS: clear to auscultation and percussion with normal breathing effort HEART: regular rate & rhythm and no murmurs and no lower extremity edema Musculoskeletal: no cyanosis of digits and no clubbing  PSYCH: alert & oriented x 3, fluent speech NEURO: no focal motor/sensory deficits  LABORATORY DATA:  I have reviewed the data as listed    Latest Ref Rng & Units 12/25/2022    7:58 AM 06/04/2022    8:21 AM 12/08/2021    8:26 AM  CBC  WBC 4.0 - 10.5 K/uL 7.2  5.6  6.5   Hemoglobin 12.0 - 15.0 g/dL 12.8  11.8  12.3   Hematocrit 36.0 - 46.0 % 37.3  34.3  36.6   Platelets 150 - 400 K/uL 268  259  257        Latest Ref Rng & Units 06/04/2022    8:21 AM 12/08/2021    8:26 AM 09/05/2021    8:16 AM  CMP  Glucose 70 - 99 mg/dL 103  110  117   BUN 8 - 23 mg/dL 6  8  8    Creatinine 0.44 - 1.00 mg/dL 0.77  0.91  0.86   Sodium 135 - 145 mmol/L 141  143  145   Potassium 3.5 - 5.1 mmol/L 3.8  4.0  4.0   Chloride 98 - 111 mmol/L 107  107  107   CO2 22 - 32 mmol/L 32  31  30   Calcium 8.9 - 10.3 mg/dL 9.2  9.5  8.9   Total Protein 6.5 - 8.1 g/dL 7.2  7.8  7.0   Total Bilirubin 0.3 - 1.2 mg/dL 0.3  0.3  0.3   Alkaline Phos 38 - 126 U/L 63  57  53   AST 15 - 41 U/L 22  19  22    ALT 0 - 44 U/L 24  18  26     ASSESSMENT & PLAN [name] is a 62 y.o. female returns for a follow up for elevated Anticardiolipin and Beta-2 Glycoprotein antibodies.   #Positive antiphospholipid antibodies: -Labs from 04/18/2021 showed elevated beta-2 glycoprotein IgG at 103.5 and anticardiolipin IgG >112.0.  Remained elevated on repeat testing 12 weeks later.  -History  of LE DVT in 2012 treated with coumadin x 2 years. -I reviewed the recommendations to initiate anticoagulation with warfarin due to elevated antibody levels but patient again declined and would like to continue on ASA 81 mg BID. If she were to change her mind we would be happy to see her back early and start this therapy. --Labs today showed white blood cell count 7.2, hemoglobin 12.8, MCV 85.9, and platelets of 268. -- We discussed the importance of postoperative anticoagulation with her upcoming hip surgery.  Patient's Caprini VTE score is 13 points which equates to a 10.7% VTE risk.  Recommendation will be 30 days of Lovenox therapy after surgery.  Patient will follow-up with [place] once her surgical date is finalized so we can send a prescription. --RTC in 1 year with labs   No orders of the defined types were placed in this encounter.  All questions were answered. The patient knows to call the clinic with any problems, questions or concerns.  I have spent a total of 30 minutes minutes of face-to-face and non-face-to-face time, preparing to see the patient, performing a medically appropriate examination, counseling and educating the patient, documenting clinical information in the electronic health record, and care coordination.   Dede Query PA-C Dept of Hematology and [place] at [facility] Phone: [phone]

## 2022-12-31 ENCOUNTER — Encounter: Payer: Self-pay | Admitting: Orthopaedic Surgery

## 2022-12-31 ENCOUNTER — Ambulatory Visit (INDEPENDENT_AMBULATORY_CARE_PROVIDER_SITE_OTHER): Payer: Medicaid Other | Admitting: Orthopaedic Surgery

## 2022-12-31 VITALS — Ht 66.5 in | Wt 274.0 lb

## 2022-12-31 DIAGNOSIS — Z6841 Body Mass Index (BMI) 40.0 and over, adult: Secondary | ICD-10-CM

## 2022-12-31 DIAGNOSIS — M1612 Unilateral primary osteoarthritis, left hip: Secondary | ICD-10-CM | POA: Diagnosis not present

## 2022-12-31 NOTE — Progress Notes (Addendum)
Office Visit Note   Patient: [name]           Date of Birth: 09/29/1961           MRN: [id number] Visit Date: 12/31/2022              Requested by: Riki Sheer, NP [place],   [id number] PCP: Riki Sheer, NP   Assessment & Plan: Visit Diagnoses:  1. Primary osteoarthritis of left hip   2. Body mass index 40.0-44.9, adult (HCC)     Plan: Impression is advanced left hip DJD.  Unfortunately she has not met her goal weight of 245 pounds which would put her BMI under 40.  She will continue make all efforts at weight loss.  Follow-up when she has achieved this goal.  Follow-Up Instructions: No follow-ups on file.   Orders:  No orders of the defined types were placed in this encounter.  No orders of the defined types were placed in this encounter.     Procedures: No procedures performed   Clinical Data: No additional findings.   Subjective: Chief Complaint  Patient presents with   Left Hip - Follow-up    HPI  [name] returns today for recheck of her left hip DJD and her weight.  She has been in the weight loss clinic and has been losing weight.  Her last reported weight was 269 pounds.  Review of Systems  Constitutional: Negative.   HENT: Negative.    Eyes: Negative.   Respiratory: Negative.    Cardiovascular: Negative.   Endocrine: Negative.   Musculoskeletal: Negative.   Neurological: Negative.   Hematological: Negative.   Psychiatric/Behavioral: Negative.    All other systems reviewed and are negative.    Objective: Vital Signs: Ht 5' 6.5" (1.689 m)   Wt 274 lb (124.3 kg)   BMI 43.56 kg/m   Physical Exam Vitals and nursing note reviewed.  Constitutional:      Appearance: She is well-developed.  HENT:     Head: Normocephalic and atraumatic.  Pulmonary:     Effort: Pulmonary effort is normal.  Abdominal:     Palpations: Abdomen is soft.  Musculoskeletal:     Cervical back: Neck supple.  Skin:     General: Skin is warm.     Capillary Refill: Capillary refill takes less than 2 seconds.  Neurological:     Mental Status: She is alert and oriented to person, place, and time.  Psychiatric:        Behavior: Behavior normal.        Thought Content: Thought content normal.        Judgment: Judgment normal.     Ortho Exam  Examination left hip is unchanged.  Severe pain with range of motion.  Antalgic gait.  Walks with a cane.  Specialty Comments:  MRI LUMBAR SPINE WITHOUT CONTRAST   TECHNIQUE: Multiplanar, multisequence MR imaging of the lumbar spine was performed. No intravenous contrast was administered.   COMPARISON:  Prior radiograph from a 02/02/2021 and MRI from 07/28/2017.   FINDINGS: Segmentation: Standard. Lowest well-formed disc space labeled the L5-S1 level.   Alignment: Exaggeration of the normal lumbar lordosis. Trace stepwise retrolisthesis of T12 on L1 and L1 on L2. Trace facet mediated anterolisthesis of L4 on L5.   Vertebrae: Vertebral body height maintained without acute or chronic fracture. Bone marrow signal intensity heterogeneous but overall within normal limits. No discrete or worrisome osseous lesions. No abnormal marrow edema.  Conus medullaris and cauda equina: Conus extends to the L1-2 level. Conus and cauda equina appear normal.   Paraspinal and other soft tissues: Paraspinous soft tissues within normal limits. Few small benign appearing cyst noted about the visualized kidneys. Visualized visceral structures otherwise unremarkable.   Disc levels:   T11-12: Mild disc bulge with disc desiccation. Left greater than right facet hypertrophy. No significant spinal stenosis. Mild left foraminal narrowing. Right neural foramen remains patent.   T12-L1: Mild disc bulge with disc desiccation. No spinal stenosis. Foramina remain patent.   L1-2: Intervertebral disc space narrowing with diffuse disc bulge and disc desiccation. Superimposed right  foraminal to extraforaminal disc protrusion (series 6, image 19). No spinal stenosis. Mild right L1 foraminal narrowing. Left neural foramen remains patent.   L2-3: Negative interspace. Mild facet and ligament flavum hypertrophy. No canal or foraminal stenosis.   L3-4: Negative interspace. Mild to moderate facet and ligament flavum hypertrophy. No spinal stenosis. Mild left greater than right L3 foraminal narrowing.   L4-5: Trace anterolisthesis. Mild disc bulge with disc desiccation. Superimposed small right foraminal disc protrusion closely approximates and/or contacts the exiting right L4 nerve root (series 6, image 37). Advanced bilateral facet arthrosis with ligament flavum hypertrophy. Resultant mild canal with moderate bilateral subarticular stenosis. Moderate bilateral L4 foraminal narrowing, worse on the right.   L5-S1: Disc desiccation with diffuse disc bulge. Mild reactive endplate spurring. Moderate left worse than right facet hypertrophy. No significant spinal stenosis. Moderate left worse than right L5 foraminal stenosis.   IMPRESSION: 1. Multifactorial degenerative changes at L4-5 with resultant mild canal with moderate bilateral subarticular stenosis, with moderate bilateral L4 foraminal narrowing. Superimposed right foraminal disc protrusion at this level could potentially affect the exiting right L4 nerve root. 2. Disc bulging with facet hypertrophy at L5-S1 with resultant moderate left worse than right L5 foraminal stenosis. 3. Additional mild noncompressive disc bulging with facet hypertrophy at L1-2 and L2-3 without significant stenosis or neural impingement.     Electronically Signed   By: Jeannine Boga M.D.   On: 12/20/2021 03:21  Imaging: No results found.   PMFS History: Patient Active Problem List   Diagnosis Date Noted   Primary osteoarthritis of both hips 12/15/2022   Visceral obesity 10/20/2022   Vitamin D deficiency 08/11/2022    Other fatigue 07/29/2022   SOBOE (shortness of breath on exertion) 07/29/2022   Type 2 diabetes mellitus with other specified complication ([place]) 123XX123   Osteoarthritis of left hip 07/29/2022   Other hyperlipidemia 07/29/2022   Depression screening 07/29/2022   Paresthesia 06/30/2022   Peripheral neuropathy 05/13/2022   Pain in left wrist 04/24/2022   Carpal tunnel syndrome, bilateral 03/18/2022   Plantar fasciitis of right foot 09/03/2021   Pes planus 09/03/2021   Nuclear sclerotic cataract of both eyes 05/27/2021   Diabetes mellitus without complication ([place]) XX123456   Vitreomacular adhesion of both eyes 05/27/2021   Antiphospholipid antibody positive 05/21/2021   Pain in left elbow 02/19/2021   Chronic left-sided low back pain with left-sided sciatica 07/22/2017   Thyroid nodule 04/16/2017   Essential hypertension 03/13/2014   Menometrorrhagia    Dysmenorrhea    Anemia    Hyperlipidemia 05/23/2007   OBESITY, MORBID 05/23/2007   DYSFUNCTIONAL UTERINE BLEEDING - s/p LAVH 05/23/2007   ROTATOR CUFF SYNDROME 05/23/2007   KNEE PAIN, HX OF 05/23/2007   Past Medical History:  Diagnosis Date   Anemia    Anxiety    Arthritis    Back pain  Chest pain    Diabetes mellitus without complication ([place])    recent dx diet controlled   DVT (deep venous thrombosis) (HCC)    remote - took coumadin   Dysmenorrhea    Dysrhythmia    irregular heart beat   Edema    Fatty liver    Fibroid    GERD (gastroesophageal reflux disease)    hx of   Gout    Hypercholesteremia    Hypertension 4 months   Hypothyroid    Joint pain    Menometrorrhagia    Morbid obesity ([place])    Osteoarthritis    Palpitations    Prediabetes    Substance abuse ([place])    Vitamin D deficiency     Family History  Problem Relation Age of Onset   Thyroid disease Mother    Hyperlipidemia Mother    Diabetes Mother    Hypertension Mother    Cancer Mother    Deep vein thrombosis Mother        and PE    Heart disease Mother    Kidney disease Mother    Diabetes Father    Hypertension Father    Cancer Father 21   Throat cancer Brother 34   Pulmonary embolism Daughter    Heart disease Other        No family history    Past Surgical History:  Procedure Laterality Date   ABDOMINAL HYSTERECTOMY  2008   BREAST BIOPSY Left 2013   benign   BUNIONECTOMY Bilateral    CHOLECYSTECTOMY     COLONOSCOPY WITH PROPOFOL N/A 10/27/2018   Procedure: COLONOSCOPY WITH PROPOFOL;  Surgeon: Wonda Horner, MD;  Location: [facility];  Service: Endoscopy;  Laterality: N/A;   ECTOPIC PREGNANCY SURGERY  1980's   MANDIBLE OSTEOTOMY Bilateral 07/03/2013   Procedure: BILATERAL TORI;  Surgeon: Gae Bon, DDS;  Location: [place];  Service: Oral Surgery;  Laterality: Bilateral;   NM MYOCAR PERF WALL MOTION  07/2012   lexiscan - normal pattern of perfusion, low risk   ROTATOR CUFF REPAIR Bilateral 2009   SLEEP STUDY  07/27/2012   AHI 4.8/hr   TOOTH EXTRACTION N/A 07/03/2013   Procedure: DENTAL EXTRACTION # 20;  Surgeon: Gae Bon, DDS;  Location: [place];  Service: Oral Surgery;  Laterality: N/A;   TRANSTHORACIC ECHOCARDIOGRAM  07/2012   EF=>55%, mod conc LVH; LA mod dilated; trace MR; mild TR with normal RVSP; trace AV regurg   TUBAL LIGATION     Social History   Occupational History    Employer: UNEMPLOYED    Comment: Disability  Tobacco Use   Smoking status: Former    Packs/day: 0.10    Years: 6.00    Additional pack years: 0.00    Total pack years: 0.60    Types: Cigarettes    Quit date: 02/14/2011    Years since quitting: 11.8   Smokeless tobacco: Never  Vaping Use   Vaping Use: Never used  Substance and Sexual Activity   Alcohol use: No    Comment: Previous ETOH abuse   Drug use: No    Comment: Previous crack use    Sexual activity: Never    Birth control/protection: Abstinence

## 2022-12-31 NOTE — Addendum Note (Signed)
Addended by: Azucena Cecil on: 12/31/2022 09:05 AM   Modules accepted: Level of Service

## 2023-01-06 ENCOUNTER — Encounter (INDEPENDENT_AMBULATORY_CARE_PROVIDER_SITE_OTHER): Payer: Self-pay

## 2023-01-06 ENCOUNTER — Encounter (INDEPENDENT_AMBULATORY_CARE_PROVIDER_SITE_OTHER): Payer: Medicaid Other | Admitting: Ophthalmology

## 2023-01-25 ENCOUNTER — Encounter (INDEPENDENT_AMBULATORY_CARE_PROVIDER_SITE_OTHER): Payer: Self-pay | Admitting: Family Medicine

## 2023-01-25 ENCOUNTER — Ambulatory Visit (INDEPENDENT_AMBULATORY_CARE_PROVIDER_SITE_OTHER): Payer: Medicaid Other | Admitting: Family Medicine

## 2023-01-25 VITALS — BP 122/80 | HR 71 | Temp 98.2°F | Ht 66.0 in | Wt 267.0 lb

## 2023-01-25 DIAGNOSIS — E1169 Type 2 diabetes mellitus with other specified complication: Secondary | ICD-10-CM | POA: Diagnosis not present

## 2023-01-25 DIAGNOSIS — Z6841 Body Mass Index (BMI) 40.0 and over, adult: Secondary | ICD-10-CM

## 2023-01-25 DIAGNOSIS — Z7985 Long-term (current) use of injectable non-insulin antidiabetic drugs: Secondary | ICD-10-CM

## 2023-01-25 DIAGNOSIS — M160 Bilateral primary osteoarthritis of hip: Secondary | ICD-10-CM

## 2023-01-25 NOTE — Assessment & Plan Note (Addendum)
Lab Results  Component Value Date   HGBA1C 5.6 07/29/2022   She is currently on Ozempic 0.5 mg weekly per her PCP  She is working to reduce her intake of added sugar and refined carbohydrates She has lost 4.6% TBW in 6 mos of medically supervised weight management  Continue Ozempic per PCP at current dose (had hypoglycemia on higher doses)

## 2023-01-25 NOTE — Progress Notes (Signed)
Office: [phone]  /  Fax: [phone]  WEIGHT SUMMARY AND BIOMETRICS  Starting Date: 07/29/22  Starting Weight: 280lb   Weight Lost Since Last Visit: 2lb   Vitals Temp: 98.2 F (36.8 C) BP: 122/80 Pulse Rate: 71 SpO2: 100 %   Body Composition  Body Fat %: 50.7 % Fat Mass (lbs): 135.8 lbs Muscle Mass (lbs): 125.4 lbs Total Body Water (lbs): 93 lbs Visceral Fat Rating : 17   HPI  Chief Complaint: OBESITY  [name] is here to discuss her progress with her obesity treatment plan. She is on the the Category 3 Plan and states she is following her eating plan approximately 50 % of the time. She states she is exercising 30 minutes 3 times per week.   Interval History:  Since last office visit she is down 2 lb She went through another dental surgery She has been drinking Atkins shakes, unsweetened applesauce. Progresso Light soup and greek yogurt.  She still can't chew gum She plans to use her treadmill at home and using resistance training at home Bilateral hip DJD - she plans to do L hip replacement before the R once her BMI is < 40   Pharmacotherapy: Ozempic 0.5 mg once weekly per PCP  PHYSICAL EXAM:  Blood pressure 122/80, pulse 71, temperature 98.2 F (36.8 C), height  (1.676 m), weight 267 lb (121.1 kg), SpO2 100 %. Body mass index is 43.09 kg/m.  General: She is overweight, cooperative, alert, well developed, and in no acute distress. PSYCH: Has normal mood, affect and thought process.   Lungs: Normal breathing effort, no conversational dyspnea. Using cane to ambulate  ASSESSMENT AND PLAN  TREATMENT PLAN FOR OBESITY:  Recommended Dietary Goals  [name] is currently in the action stage of change. As such, her goal is to continue weight management plan. She has agreed to keeping a food journal and adhering to recommended goals of 1500 calories and 100g of protein.  CHANGED FROM CAT3 MEAL PLAN  Behavioral Intervention  We discussed the  following Behavioral Modification Strategies today: increasing lean protein intake, increasing vegetables, increasing lower glycemic fruits, increasing fiber rich foods, avoiding skipping meals, increasing water intake, work on meal planning and preparation, work on tracking and journaling calories using tracking application, and reading food labels .  Additional resources provided today: NA  Recommended Physical Activity Goals  [name] has been advised to work up to 150 minutes of moderate intensity aerobic activity a week and strengthening exercises 2-3 times per week for cardiovascular health, weight loss maintenance and preservation of muscle mass.   She has agreed to Think about ways to increase physical activity  Pharmacotherapy changes for the treatment of obesity: NONE  ASSOCIATED CONDITIONS ADDRESSED TODAY  Type 2 diabetes mellitus with other specified complication, without long-term current use of insulin Assessment & Plan: Lab Results  Component Value Date   HGBA1C 5.6 07/29/2022   She is currently on Ozempic 0.5 mg weekly per her PCP  She is working to reduce her intake of added sugar and refined carbohydrates She has lost 4.6% TBW in 6 mos of medically supervised weight management  Continue Ozempic per PCP at current dose (had hypoglycemia on higher doses)   OBESITY, MORBID  BMI 40.0-44.9, adult  Primary osteoarthritis of both hips Assessment & Plan: Bilat hip DJD limits her walking time but she plans to add in shorter walks on her treadmill at home.  She is actively working on BMI reduction to have bilateral hip replacement surgery  done.  F/u with orthopedics Continue active plan for pre op BMI reduction Recommend walking 10 min 2-3 x a day       She was informed of the importance of frequent follow up visits to maximize her success with intensive lifestyle modifications for her multiple health conditions.   ATTESTASTION STATEMENTS:  Reviewed by  clinician on day of visit: allergies, medications, problem list, medical history, surgical history, family history, social history, and previous encounter notes pertinent to obesity diagnosis.   I have personally spent 30 minutes total time today in preparation, patient care, nutritional counseling and documentation for this visit, including the following: review of clinical lab tests; review of medical tests/procedures/services.      Glennis Brink, DO DABFM, DABOM [facility] [address] [place], [place] [address] [phone]

## 2023-01-25 NOTE — Assessment & Plan Note (Signed)
Bilat hip DJD limits her walking time but she plans to add in shorter walks on her treadmill at home.  She is actively working on BMI reduction to have bilateral hip replacement surgery done.  F/u with orthopedics Continue active plan for pre op BMI reduction Recommend walking 10 min 2-3 x a day

## 2023-02-16 ENCOUNTER — Other Ambulatory Visit (INDEPENDENT_AMBULATORY_CARE_PROVIDER_SITE_OTHER): Payer: Medicaid Other

## 2023-02-16 ENCOUNTER — Ambulatory Visit (INDEPENDENT_AMBULATORY_CARE_PROVIDER_SITE_OTHER): Payer: Medicaid Other | Admitting: Orthopaedic Surgery

## 2023-02-16 ENCOUNTER — Encounter: Payer: Self-pay | Admitting: Orthopaedic Surgery

## 2023-02-16 DIAGNOSIS — M1711 Unilateral primary osteoarthritis, right knee: Secondary | ICD-10-CM

## 2023-02-16 NOTE — Progress Notes (Signed)
Office Visit Note   Patient: [name]           Date of Birth: 06/17/1961           MRN: [id number] Visit Date: 02/16/2023              Requested by: Shanna Cisco, NP [address] [place],  [place] [address] PCP: Shanna Cisco, NP   Assessment & Plan: Visit Diagnoses:  1. Primary osteoarthritis of right knee     Plan: Impression is right knee OA flare that has almost completely resolved.  Had bone on bone DJD.  Cortisone shots have not been effective in the past and she is not interested at this time.    Follow-Up Instructions: No follow-ups on file.   Orders:  Orders Placed This Encounter  Procedures   XR KNEE 3 VIEW RIGHT   No orders of the defined types were placed in this encounter.     Procedures: No procedures performed   Clinical Data: No additional findings.   Subjective: Chief Complaint  Patient presents with   Right Knee - Pain    HPI [name] comes in today for evaluation of right knee pain for about a week and a half.  She was doing some exercises and felt pain.  Denies any specific injuries.  Feels like overall has gotten better since she has rested it. Review of Systems  Constitutional: Negative.   HENT: Negative.    Eyes: Negative.   Respiratory: Negative.    Cardiovascular: Negative.   Endocrine: Negative.   Musculoskeletal: Negative.   Neurological: Negative.   Hematological: Negative.   Psychiatric/Behavioral: Negative.    All other systems reviewed and are negative.    Objective: Vital Signs: There were no vitals taken for this visit.  Physical Exam Vitals and nursing note reviewed.  Constitutional:      Appearance: She is well-developed.  HENT:     Head: Atraumatic.     Nose: Nose normal.  Eyes:     Extraocular Movements: Extraocular movements intact.  Cardiovascular:     Pulses: Normal pulses.  Pulmonary:     Effort: Pulmonary effort is normal.  Abdominal:     Palpations: Abdomen is soft.   Musculoskeletal:     Cervical back: Neck supple.  Skin:    General: Skin is warm.     Capillary Refill: Capillary refill takes less than 2 seconds.  Neurological:     Mental Status: She is alert. Mental status is at baseline.  Psychiatric:        Behavior: Behavior normal.        Thought Content: Thought content normal.        Judgment: Judgment normal.     Ortho Exam Right knee exam shows trace effusion.  No significant joint line tenderness. Specialty Comments:  MRI LUMBAR SPINE WITHOUT CONTRAST   TECHNIQUE: Multiplanar, multisequence MR imaging of the lumbar spine was performed. No intravenous contrast was administered.   COMPARISON:  Prior radiograph from a 02/02/2021 and MRI from 07/28/2017.   FINDINGS: Segmentation: Standard. Lowest well-formed disc space labeled the L5-S1 level.   Alignment: Exaggeration of the normal lumbar lordosis. Trace stepwise retrolisthesis of T12 on L1 and L1 on L2. Trace facet mediated anterolisthesis of L4 on L5.   Vertebrae: Vertebral body height maintained without acute or chronic fracture. Bone marrow signal intensity heterogeneous but overall within normal limits. No discrete or worrisome osseous lesions. No abnormal marrow edema.   Conus  medullaris and cauda equina: Conus extends to the L1-2 level. Conus and cauda equina appear normal.   Paraspinal and other soft tissues: Paraspinous soft tissues within normal limits. Few small benign appearing cyst noted about the visualized kidneys. Visualized visceral structures otherwise unremarkable.   Disc levels:   T11-12: Mild disc bulge with disc desiccation. Left greater than right facet hypertrophy. No significant spinal stenosis. Mild left foraminal narrowing. Right neural foramen remains patent.   T12-L1: Mild disc bulge with disc desiccation. No spinal stenosis. Foramina remain patent.   L1-2: Intervertebral disc space narrowing with diffuse disc bulge and disc  desiccation. Superimposed right foraminal to extraforaminal disc protrusion (series 6, image 19). No spinal stenosis. Mild right L1 foraminal narrowing. Left neural foramen remains patent.   L2-3: Negative interspace. Mild facet and ligament flavum hypertrophy. No canal or foraminal stenosis.   L3-4: Negative interspace. Mild to moderate facet and ligament flavum hypertrophy. No spinal stenosis. Mild left greater than right L3 foraminal narrowing.   L4-5: Trace anterolisthesis. Mild disc bulge with disc desiccation. Superimposed small right foraminal disc protrusion closely approximates and/or contacts the exiting right L4 nerve root (series 6, image 37). Advanced bilateral facet arthrosis with ligament flavum hypertrophy. Resultant mild canal with moderate bilateral subarticular stenosis. Moderate bilateral L4 foraminal narrowing, worse on the right.   L5-S1: Disc desiccation with diffuse disc bulge. Mild reactive endplate spurring. Moderate left worse than right facet hypertrophy. No significant spinal stenosis. Moderate left worse than right L5 foraminal stenosis.   IMPRESSION: 1. Multifactorial degenerative changes at L4-5 with resultant mild canal with moderate bilateral subarticular stenosis, with moderate bilateral L4 foraminal narrowing. Superimposed right foraminal disc protrusion at this level could potentially affect the exiting right L4 nerve root. 2. Disc bulging with facet hypertrophy at L5-S1 with resultant moderate left worse than right L5 foraminal stenosis. 3. Additional mild noncompressive disc bulging with facet hypertrophy at L1-2 and L2-3 without significant stenosis or neural impingement.     Electronically Signed   By: Rise Mu M.D.   On: 12/20/2021 03:21  Imaging: No results found.   PMFS History: Patient Active Problem List   Diagnosis Date Noted   Primary osteoarthritis of both hips 12/15/2022   Visceral obesity 10/20/2022    Vitamin D deficiency 08/11/2022   Other fatigue 07/29/2022   SOBOE (shortness of breath on exertion) 07/29/2022   Type 2 diabetes mellitus with other specified complication (HCC) 07/29/2022   Osteoarthritis of left hip 07/29/2022   Other hyperlipidemia 07/29/2022   Depression screening 07/29/2022   Paresthesia 06/30/2022   Peripheral neuropathy 05/13/2022   Pain in left wrist 04/24/2022   Carpal tunnel syndrome, bilateral 03/18/2022   Plantar fasciitis of right foot 09/03/2021   Pes planus 09/03/2021   Nuclear sclerotic cataract of both eyes 05/27/2021   Diabetes mellitus without complication (HCC) 05/27/2021   Vitreomacular adhesion of both eyes 05/27/2021   Antiphospholipid antibody positive 05/21/2021   Pain in left elbow 02/19/2021   Chronic left-sided low back pain with left-sided sciatica 07/22/2017   Thyroid nodule 04/16/2017   Essential hypertension 03/13/2014   Menometrorrhagia    Dysmenorrhea    Anemia    Hyperlipidemia 05/23/2007   OBESITY, MORBID 05/23/2007   DYSFUNCTIONAL UTERINE BLEEDING - s/p LAVH 05/23/2007   ROTATOR CUFF SYNDROME 05/23/2007   KNEE PAIN, HX OF 05/23/2007   Past Medical History:  Diagnosis Date   Anemia    Anxiety    Arthritis    Back pain  Chest pain    Diabetes mellitus without complication (HCC)    recent dx diet controlled   DVT (deep venous thrombosis) (HCC)    remote - took coumadin   Dysmenorrhea    Dysrhythmia    irregular heart beat   Edema    Fatty liver    Fibroid    GERD (gastroesophageal reflux disease)    hx of   Gout    Hypercholesteremia    Hypertension 4 months   Hypothyroid    Joint pain    Menometrorrhagia    Morbid obesity (HCC)    Osteoarthritis    Palpitations    Prediabetes    Substance abuse (HCC)    Vitamin D deficiency     Family History  Problem Relation Age of Onset   Thyroid disease Mother    Hyperlipidemia Mother    Diabetes Mother    Hypertension Mother    Cancer Mother    Deep vein  thrombosis Mother        and PE   Heart disease Mother    Kidney disease Mother    Diabetes Father    Hypertension Father    Cancer Father 19   Throat cancer Brother 85   Pulmonary embolism Daughter    Heart disease Other        No family history    Past Surgical History:  Procedure Laterality Date   ABDOMINAL HYSTERECTOMY  2008   BREAST BIOPSY Left 2013   benign   BUNIONECTOMY Bilateral    CHOLECYSTECTOMY     COLONOSCOPY WITH PROPOFOL N/A 10/27/2018   Procedure: COLONOSCOPY WITH PROPOFOL;  Surgeon: Graylin Shiver, MD;  Location: [facility];  Service: Endoscopy;  Laterality: N/A;   ECTOPIC PREGNANCY SURGERY  1980's   MANDIBLE OSTEOTOMY Bilateral 07/03/2013   Procedure: BILATERAL TORI;  Surgeon: Georgia Lopes, DDS;  Location: [facility] OR;  Service: Oral Surgery;  Laterality: Bilateral;   NM MYOCAR PERF WALL MOTION  07/2012   lexiscan - normal pattern of perfusion, low risk   ROTATOR CUFF REPAIR Bilateral 2009   SLEEP STUDY  07/27/2012   AHI 4.8/hr   TOOTH EXTRACTION N/A 07/03/2013   Procedure: DENTAL EXTRACTION # 20;  Surgeon: Georgia Lopes, DDS;  Location: [facility] OR;  Service: Oral Surgery;  Laterality: N/A;   TRANSTHORACIC ECHOCARDIOGRAM  07/2012   EF=>55%, mod conc LVH; LA mod dilated; trace MR; mild TR with normal RVSP; trace AV regurg   TUBAL LIGATION     Social History   Occupational History    Employer: UNEMPLOYED    Comment: Disability  Tobacco Use   Smoking status: Former    Packs/day: 0.10    Years: 6.00    Additional pack years: 0.00    Total pack years: 0.60    Types: Cigarettes    Quit date: 02/14/2011    Years since quitting: 12.0   Smokeless tobacco: Never  Vaping Use   Vaping Use: Never used  Substance and Sexual Activity   Alcohol use: No    Comment: Previous ETOH abuse   Drug use: No    Comment: Previous crack use    Sexual activity: Never    Birth control/protection: Abstinence

## 2023-02-22 ENCOUNTER — Encounter (INDEPENDENT_AMBULATORY_CARE_PROVIDER_SITE_OTHER): Payer: Self-pay | Admitting: Family Medicine

## 2023-02-22 DIAGNOSIS — R7303 Prediabetes: Secondary | ICD-10-CM | POA: Insufficient documentation

## 2023-02-23 ENCOUNTER — Ambulatory Visit (INDEPENDENT_AMBULATORY_CARE_PROVIDER_SITE_OTHER): Payer: Medicaid Other | Admitting: Family Medicine

## 2023-02-23 ENCOUNTER — Encounter (INDEPENDENT_AMBULATORY_CARE_PROVIDER_SITE_OTHER): Payer: Self-pay | Admitting: Family Medicine

## 2023-02-23 VITALS — BP 110/73 | HR 82 | Temp 98.1°F | Ht 66.0 in | Wt 261.0 lb

## 2023-02-23 DIAGNOSIS — E65 Localized adiposity: Secondary | ICD-10-CM | POA: Diagnosis not present

## 2023-02-23 DIAGNOSIS — R7303 Prediabetes: Secondary | ICD-10-CM

## 2023-02-23 DIAGNOSIS — Z6841 Body Mass Index (BMI) 40.0 and over, adult: Secondary | ICD-10-CM

## 2023-02-23 DIAGNOSIS — M160 Bilateral primary osteoarthritis of hip: Secondary | ICD-10-CM

## 2023-02-23 NOTE — Assessment & Plan Note (Signed)
Improving.  Visceral fat rating has reduced from 19-16 in the past 6 months.  She is doing great enjoying seeing inches lost.  She is losing body fat and gaining muscle mass.  Continue working on reduced calorie diet focused on lean protein and fiber with meals, dietary logging and exercise as tolerated.

## 2023-02-23 NOTE — Assessment & Plan Note (Signed)
Reviewed patient's overall progress.  She has lost 19 pounds in the past 6 months of medically supervised weight management.  She feels good about her progress.  Reviewed her bioimpedance results.  She is gaining muscle mass and losing body fat appropriately.  She feels adequate satiety on her prescribed meal plan and use of Ozempic 0.5 mg once weekly injection per PCP.  She has a 6.7% total body weight loss in the past 6 months.  Continue prescribed dietary plan.  Follow-up in 4 weeks

## 2023-02-23 NOTE — Progress Notes (Signed)
Office: [phone]  /  Fax: [phone]  WEIGHT SUMMARY AND BIOMETRICS  Starting Date: 07/29/22  Starting Weight: 280 lb   Weight Lost Since Last Visit: 6 lb   Vitals Temp: 98.1 F (36.7 C) BP: 110/73 Pulse Rate: 82 SpO2: 99 %   Body Composition  Body Fat %: 47.4 % Fat Mass (lbs): 124 lbs Muscle Mass (lbs): 130.6 lbs Total Body Water (lbs): 93.8 lbs Visceral Fat Rating : 16     HPI  Chief Complaint: OBESITY  [name] is here to discuss her progress with her obesity treatment plan. She is on the the Category 3 Plan and states she is following her eating plan approximately 50 % of the time. She states she is exercising 30 minutes 2 times per week.   Interval History:  Since last office visit she is down 6 lb She has a net weight loss of 19 lb in the past 7 mos She went up in her muscle mass 5.2 lb and is down 11.8 lb of body fat She stayed on track during a recent beach trip She is still waiting for dentures and hasn't been able to chew much She is drinking more protein shakes and and eating greek yogurt She is limited with exercise due to bilat hip pain and L knee DJD pain [facility]   Pharmacotherapy: Ozempic 0.5 mg once weekly injection per PCP for prediabetes  PHYSICAL EXAM:  Blood pressure 110/73, pulse 82, temperature 98.1 F (36.7 C), height 5\' 6"  (1.676 m), weight 261 lb (118.4 kg), SpO2 99 %. Body mass index is 42.13 kg/m.  General: She is overweight, cooperative, alert, well developed, and in no acute distress. PSYCH: Has normal mood, affect and thought process.   Lungs: Normal breathing effort, no conversational dyspnea.   ASSESSMENT AND PLAN  TREATMENT PLAN FOR OBESITY:  Recommended Dietary Goals  [name] is currently in the action stage of change. As such, her goal is to continue weight management plan. She has agreed to the Category 3 Plan.  Behavioral Intervention  We discussed the following  Behavioral Modification Strategies today: increasing lean protein intake, decreasing simple carbohydrates , increasing vegetables, increasing lower glycemic fruits, increasing fiber rich foods, increasing water intake, reading food labels , keeping healthy foods at home, continue to practice mindfulness when eating, and planning for success.  Additional resources provided today: NA  Recommended Physical Activity Goals  [name] has been advised to work up to 150 minutes of moderate intensity aerobic activity a week and strengthening exercises 2-3 times per week for cardiovascular health, weight loss maintenance and preservation of muscle mass.   She has agreed to Think about ways to increase daily physical activity and overcoming barriers to exercise  Pharmacotherapy changes for the treatment of obesity: None  ASSOCIATED CONDITIONS ADDRESSED TODAY  Prediabetes Assessment & Plan: Chart reviewed.  A1c max appears to be 6.4 in the prediabetic range.  She has off-label been using Ozempic 0.5 mg once weekly injection by her PCP and responded well with improved satiety and weight reduction.  She is doing well with dietary changes, reducing added sugar and refined carbohydrates.  She has lost 19 pounds in the past 6 months of medically supervised weight management.  Exercise has been limited due to arthritis pain.  Lab Results  Component Value Date   HGBA1C 5.6 07/29/2022     Continue Ozempic 0.5 mg once weekly injection per PCP along with prescribed dietary change.  Recommend adding in pool exercise 2 days a week.   OBESITY, MORBID with starting BMI 45 Assessment & Plan: Reviewed patient's overall progress.  She has lost 19 pounds in the past 6 months of medically supervised weight management.  She feels good about her progress.  Reviewed her bioimpedance results.  She is gaining muscle mass and losing body fat appropriately.  She feels adequate satiety on her prescribed meal plan and use of  Ozempic 0.5 mg once weekly injection per PCP.  She has a 6.7% total body weight loss in the past 6 months.  Continue prescribed dietary plan.  Follow-up in 4 weeks   BMI 40.0-44.9, adult (HCC)  Primary osteoarthritis of both hips Assessment & Plan: Bilateral hip pain and now left knee pain have hindered her ability to walk for exercise.  She is actively been working on BMI reduction to have hip replacement surgery.  She plans to do the left hip first.  She does have access to the [facility] for water exercise.  Continue active plan for BMI reduction.  Recommend following up with orthopedic surgeon in August with plans to do left hip replacement after her cruise in September.  Recommend water exercise at the [facility] 2 to 3 days a week.   Central adiposity Assessment & Plan: Improving.  Visceral fat rating has reduced from 19-16 in the past 6 months.  She is doing great enjoying seeing inches lost.  She is losing body fat and gaining muscle mass.  Continue working on reduced calorie diet focused on lean protein and fiber with meals, dietary logging and exercise as tolerated.       She was informed of the importance of frequent follow up visits to maximize her success with intensive lifestyle modifications for her multiple health conditions.   ATTESTASTION STATEMENTS:  Reviewed by clinician on day of visit: allergies, medications, problem list, medical history, surgical history, family history, social history, and previous encounter notes pertinent to obesity diagnosis.   I have personally spent 30 minutes total time today in preparation, patient care, nutritional counseling and documentation for this visit, including the following: review of clinical lab tests; review of medical tests/procedures/services.      Glennis Brink, DO DABFM, DABOM [facility] [address] [place], [place] [address] [phone]

## 2023-02-23 NOTE — Assessment & Plan Note (Signed)
Bilateral hip pain and now left knee pain have hindered her ability to walk for exercise.  She is actively been working on BMI reduction to have hip replacement surgery.  She plans to do the left hip first.  She does have access to the [facility] for water exercise.  Continue active plan for BMI reduction.  Recommend following up with orthopedic surgeon in August with plans to do left hip replacement after her cruise in September.  Recommend water exercise at the [facility] 2 to 3 days a week.

## 2023-02-23 NOTE — Assessment & Plan Note (Addendum)
Chart reviewed.  A1c max appears to be 6.4 in the prediabetic range.  She has off-label been using Ozempic 0.5 mg once weekly injection by her PCP and responded well with improved satiety and weight reduction.  She is doing well with dietary changes, reducing added sugar and refined carbohydrates.  She has lost 19 pounds in the past 6 months of medically supervised weight management.  Exercise has been limited due to arthritis pain.  Lab Results  Component Value Date   HGBA1C 5.6 07/29/2022     Continue Ozempic 0.5 mg once weekly injection per PCP along with prescribed dietary change.  Recommend adding in pool exercise 2 days a week.

## 2023-03-15 ENCOUNTER — Other Ambulatory Visit: Payer: Self-pay | Admitting: Family Medicine

## 2023-03-15 DIAGNOSIS — Z1231 Encounter for screening mammogram for malignant neoplasm of breast: Secondary | ICD-10-CM

## 2023-03-17 ENCOUNTER — Ambulatory Visit
Admission: RE | Admit: 2023-03-17 | Discharge: 2023-03-17 | Disposition: A | Discharge status: Home / Self Care | Payer: Medicaid Other | Source: Ambulatory Visit | Attending: Family Medicine | Admitting: Family Medicine

## 2023-03-17 DIAGNOSIS — Z1231 Encounter for screening mammogram for malignant neoplasm of breast: Secondary | ICD-10-CM

## 2023-03-30 ENCOUNTER — Ambulatory Visit (INDEPENDENT_AMBULATORY_CARE_PROVIDER_SITE_OTHER): Payer: Medicaid Other | Admitting: Family Medicine

## 2023-04-20 ENCOUNTER — Ambulatory Visit (INDEPENDENT_AMBULATORY_CARE_PROVIDER_SITE_OTHER): Payer: Medicaid Other | Admitting: Family Medicine

## 2023-04-29 ENCOUNTER — Ambulatory Visit (INDEPENDENT_AMBULATORY_CARE_PROVIDER_SITE_OTHER): Payer: Medicaid Other | Admitting: Family Medicine

## 2023-04-29 ENCOUNTER — Encounter (INDEPENDENT_AMBULATORY_CARE_PROVIDER_SITE_OTHER): Payer: Self-pay | Admitting: Family Medicine

## 2023-04-29 VITALS — BP 98/65 | HR 64 | Temp 98.3°F | Ht 66.0 in | Wt 261.0 lb

## 2023-04-29 DIAGNOSIS — E559 Vitamin D deficiency, unspecified: Secondary | ICD-10-CM

## 2023-04-29 DIAGNOSIS — E65 Localized adiposity: Secondary | ICD-10-CM

## 2023-04-29 DIAGNOSIS — I152 Hypertension secondary to endocrine disorders: Secondary | ICD-10-CM

## 2023-04-29 DIAGNOSIS — Z6841 Body Mass Index (BMI) 40.0 and over, adult: Secondary | ICD-10-CM

## 2023-04-29 DIAGNOSIS — E1169 Type 2 diabetes mellitus with other specified complication: Secondary | ICD-10-CM | POA: Diagnosis not present

## 2023-04-29 DIAGNOSIS — Z7985 Long-term (current) use of injectable non-insulin antidiabetic drugs: Secondary | ICD-10-CM

## 2023-04-29 DIAGNOSIS — E669 Obesity, unspecified: Secondary | ICD-10-CM

## 2023-04-29 DIAGNOSIS — E1159 Type 2 diabetes mellitus with other circulatory complications: Secondary | ICD-10-CM

## 2023-04-29 MED ORDER — METOPROLOL SUCCINATE ER 25 MG PO TB24: 25.0000 mg | ORAL_TABLET | Freq: Every day | ORAL | 0 refills | Status: DC

## 2023-04-29 NOTE — Progress Notes (Signed)
[name], D.O.  ABFM, ABOM Specializing in Clinical Bariatric Medicine  Office located at: [address] [place], [place]  [address]     Assessment and Plan:   Medications Discontinued During This Encounter  Medication Reason   levothyroxine (SYNTHROID) 75 MCG tablet    metoprolol succinate (TOPROL-XL) 50 MG 24 hr tablet      Meds ordered this encounter  Medications   metoprolol succinate (TOPROL-XL) 25 MG 24 hr tablet    Sig: Take 1 tablet (25 mg total) by mouth daily.    Dispense:  30 tablet    Refill:  0     Type 2 diabetes mellitus with obesity (HCC) Assessment & Plan: Reports being diagnosed with diabetes 5 yrs ago at a [facility] when her A1c was 6.8. Her diabetes mellitus is currently being treated with off-label Ozempic 0.5 mg once weekly per her PCP. Reports being on this injectable for roughly 2 yrs now and notes that it helps with decreasing  hunger/cravings and food noise. She wishes to stay at the 0.5 mg dose.   Lab Results  Component Value Date   HGBA1C 5.6 07/29/2022   HGBA1C 6.4 (H) 06/19/2013   HGBA1C 6.0 11/20/2009   INSULIN 8.2 07/29/2022    Maintain with Ozempic at current dose. I encouraged pt to bring copy of  labs from [facility] to her next OV so that we can input the information into our system.  Continue her prudent nutritional plan that is low in simple carbohydrates, saturated fats and trans fats to goal of 5-10% weight loss to achieve significant health benefits.  Pt encouraged to continually advance exercise and cardiovascular fitness as tolerated throughout weight loss journey. Will continue to monitor condition alongside PCP/specialists as it relates to their weight loss journey    Hypertension associated with type 2 diabetes mellitus (HCC) Assessment & Plan: Her blood pressure is in the low normal range today. Pt denies any dizziness/lightheadedness. Endorses that her last blood pressure at home was 117/85-she  checks her bp every other day. Her HTN is being treated with Lasix 20 mg daily, Zestril 5 mg daily, and Toprol-XL 50 mg daily. Additionally, she reports not having any issues with irregular heart rate in the last 10 yrs.   Last 3 blood pressure readings in our office are as follows: BP Readings from Last 3 Encounters:  04/29/23 98/65  02/23/23 110/73  01/25/23 122/80   Decrease Toprol-XL to 25 mg daily. Continue with all other blood pressure medications at current doses. Pt endorses that she'll also be seeing her PCP next month to discuss her bp medication management. Ambulatory blood pressure monitoring encouraged.  Reminded patient that if they ever feel poorly in any way, to check their blood pressure and pulse as well.We will continue to monitor symptoms as they relate to the her weight loss journey.    Visceral Adiposity Assessment & Plan: Her Visceral Fat rating was 19 on 10/23 when starting the program. Current visceral fat rating: 16.  The visceral fat rating should be < 12 in a female and < 10 in a female.    Visceral adipose tissue is a hormonally active component of total body fat. This body composition phenotype is associated with medical disorders such as metabolic syndrome, cardiovascular disease and several malignancies including prostate, breast, and colorectal cancers. Goal: Lose 7-10% of weight via prudent nutritional plan and lifestyle changes.     Vitamin D deficiency Assessment & Plan:  Per  pt, she is taking Ergocalciferol 50K IU every other wk and is tolerating it well.   Lab Results  Component Value Date   VD25OH 82.9 07/29/2022   Pt advised to maintain with ERGO at current dose.Weight loss will likely improve availability of vitamin D, thus encouraged Aoi to continue with meal plan and their weight loss efforts to further improve this condition.  Thus, we will need to monitor levels regularly (every 3-4 mo on average) to keep levels within normal limits and  prevent over supplementation.   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 See Medical Weight Management Flowsheet for complete bioelectrical impedance results.  Condition is improving. Biometric data collected today, was reviewed with patient.   Since last office visit on 02/23/23 patient's  Muscle mass has increased by 3.8 lb. Fat mass has decreased by 3.8 lb. Total body water has decreased by 2.2 lb.  Counseling done on how various foods will affect these numbers and how to maximize success  Total lbs lost to date: 19  Total weight loss percentage to date: 6.79   Plan: Continue the Category 3 meal plan with breakfast and lunch options  Behavioral Intervention Additional resources provided today: patient declined Evidence-based interventions for health behavior change were utilized today including the discussion of self monitoring techniques, problem-solving barriers and SMART goal setting techniques.   Regarding patient's less desirable eating habits and patterns, we employed the technique of small changes.  Pt will specifically work on: increase walking to 30 minutes, 3 days a wk  for next visit.    Recommended Physical Activity Goals  Shilynn has been advised to slowly work up to 150 minutes of moderate intensity aerobic activity a week and strengthening exercises 2-3 times per week for cardiovascular health, weight loss maintenance and preservation of muscle mass.   She has agreed to Continue current level of physical activity   FOLLOW UP: Return in 3-4 wks. She was informed of the importance of frequent follow up visits to maximize her success with intensive lifestyle modifications for her multiple health conditions.  Subjective:   Chief complaint:  Obesity Eyleen is here to discuss her progress with her obesity treatment plan. She is on the Category 3 Plan with breakfast and lunch options and states she is following her eating plan approximately 50% of the time. She states she is walking 30 minutes 2 days per week.  Interval History:  Avyanna Spada is here for a follow up office visit. This is my first time meeting with [name]; she has been seeing Dr.Bowen. Sausha endorses being off track with her meal plan lately due to having her grand kids over and the heat. She expresses interest in being more diligent with her meal plan and increasing her exercise.   Pharmacotherapy for weight loss: She is currently taking  Ozempic 0.5 mg once weekly  for medical weight loss.  Denies side effects.    Review of Systems:  Pertinent positives were addressed with patient today.  Reviewed by clinician on day of visit: allergies, medications, problem list, medical history, surgical history, family history, social history, and previous encounter notes.  Weight Summary and Biometrics   Weight Lost Since Last Visit: 0  Weight Gained Since Last Visit: 0    Vitals Temp: 98.3 F (36.8 C) BP:  98/65 Pulse Rate: 64 SpO2: 98 %   Anthropometric Measurements Height: 5\' 6"  (1.676 m) Weight: 261 lb (118.4 kg) BMI (Calculated): 42.15 Weight at Last Visit: 261lb Weight Lost Since Last Visit: 0 Weight Gained Since Last Visit: 0 Starting Weight: 280lb Total Weight Loss (lbs): 19 lb (8.618 kg) Peak Weight: 330lb   Body Composition  Body Fat %: 45.9 % Fat Mass (lbs): 120.2 lbs Muscle Mass (lbs): 134.4 lbs Total Body Water (lbs): 91.6 lbs Visceral Fat Rating : 16   Other Clinical Data Fasting: no Labs: no Today's Visit #: 10 Starting Date: 08/08/22   Objective:   PHYSICAL EXAM: Blood pressure 98/65, pulse 64, temperature 98.3 F (36.8 C), height 5\' 6"  (1.676 m), weight 261 lb (118.4 kg), SpO2 98%. Body mass index is 42.13  kg/m.  General: Well Developed, well nourished, and in no acute distress.  HEENT: Normocephalic, atraumatic Skin: Warm and dry, cap RF less 2 sec, good turgor Chest:  Normal excursion, shape, no gross abn Respiratory: speaking in full sentences, no conversational dyspnea NeuroM-Sk: Ambulates w/o assistance, moves * 4 Psych: A and O *3, insight good, mood-full  DIAGNOSTIC DATA REVIEWED:  BMET    Component Value Date/Time   NA 141 12/25/2022 0758   K 3.9 12/25/2022 0758   CL 106 12/25/2022 0758   CO2 31 12/25/2022 0758   GLUCOSE 121 (H) 12/25/2022 0758   BUN 15 12/25/2022 0758   CREATININE 1.00 12/25/2022 0758   CREATININE 1.06 (H) 04/18/2021 0905   CALCIUM 9.4 12/25/2022 0758   GFRNONAA >60 12/25/2022 0758   GFRNONAA 57 (L) 04/18/2021 0905   GFRAA 66 04/18/2021 0905   Lab Results  Component Value Date   HGBA1C 5.6 07/29/2022   HGBA1C 6.0 11/20/2009   Lab Results  Component Value Date   INSULIN 8.2 07/29/2022   Lab Results  Component Value Date   TSH 0.589 07/29/2022   CBC    Component Value Date/Time   WBC 7.2 12/25/2022 0758   WBC 5.5 04/18/2021 0905   RBC 4.34 12/25/2022 0758   HGB 12.8 12/25/2022 0758   HCT 37.3 12/25/2022 0758   PLT 268 12/25/2022 0758   MCV 85.9 12/25/2022 0758   MCH 29.5 12/25/2022 0758   MCHC 34.3 12/25/2022 0758   RDW 13.4 12/25/2022 0758   Iron Studies No results found for: "IRON", "TIBC", "FERRITIN", "IRONPCTSAT" Lipid Panel     Component Value Date/Time   CHOL 129 07/29/2022 0930   TRIG 92 07/29/2022 0930   HDL 59 07/29/2022 0930   CHOLHDL 2.2 07/29/2022 0930   CHOLHDL 5.0 Ratio 10/07/2009 1913   VLDL 38 10/07/2009 1913   LDLCALC 53 07/29/2022 0930   Hepatic Function Panel     Component Value Date/Time   PROT 7.8 12/25/2022 0758   ALBUMIN 4.1 12/25/2022 0758   AST 22 12/25/2022 0758   ALT 24 12/25/2022 0758   ALKPHOS 68 12/25/2022 0758   BILITOT 0.5 12/25/2022 0758   BILIDIR 0.1 11/26/2010 2026   IBILI 0.2  11/26/2010 2026      Component Value Date/Time   TSH 0.589 07/29/2022 0930   Nutritional Lab Results  Component Value Date   VD25OH 82.9 07/29/2022    Attestations:   I, Special Puri , acting as a Stage manager for [employer], DO., have compiled all relevant documentation for today's office visit on behalf of Thomasene Lot, DO, while in the presence of [employer], DO.  I have reviewed the above documentation for accuracy and  completeness, and I agree with the above. [name], D.O.  The 21st Century Cures Act was signed into law in 2016 which includes the topic of electronic health records.  This provides immediate access to information in MyChart.  This includes consultation notes, operative notes, office notes, lab results and pathology reports.  If you have any questions about what you read please let us know at your next visit so we can discuss your concerns and take corrective action if need be.  We are right here with you.

## 2023-05-31 ENCOUNTER — Ambulatory Visit (INDEPENDENT_AMBULATORY_CARE_PROVIDER_SITE_OTHER): Payer: Medicaid Other | Admitting: Family Medicine

## 2023-06-03 ENCOUNTER — Emergency Department (HOSPITAL_COMMUNITY)
Admission: EM | Admit: 2023-06-03 | Discharge: 2023-06-03 | Disposition: A | Discharge status: Home / Self Care | Payer: Medicaid Other | Attending: Emergency Medicine | Admitting: Emergency Medicine

## 2023-06-03 ENCOUNTER — Encounter (HOSPITAL_COMMUNITY): Payer: Self-pay

## 2023-06-03 ENCOUNTER — Other Ambulatory Visit: Payer: Self-pay

## 2023-06-03 ENCOUNTER — Emergency Department (HOSPITAL_COMMUNITY): Payer: Medicaid Other

## 2023-06-03 DIAGNOSIS — Z79899 Other long term (current) drug therapy: Secondary | ICD-10-CM | POA: Insufficient documentation

## 2023-06-03 DIAGNOSIS — R0789 Other chest pain: Secondary | ICD-10-CM | POA: Insufficient documentation

## 2023-06-03 DIAGNOSIS — G8929 Other chronic pain: Secondary | ICD-10-CM | POA: Insufficient documentation

## 2023-06-03 DIAGNOSIS — R079 Chest pain, unspecified: Secondary | ICD-10-CM

## 2023-06-03 DIAGNOSIS — R0600 Dyspnea, unspecified: Secondary | ICD-10-CM | POA: Diagnosis not present

## 2023-06-03 DIAGNOSIS — Z7982 Long term (current) use of aspirin: Secondary | ICD-10-CM | POA: Diagnosis not present

## 2023-06-03 LAB — CBC
HCT: 36.8 % (ref 36.0–46.0)
Hemoglobin: 12.4 g/dL (ref 12.0–15.0)
MCH: 29.5 pg (ref 26.0–34.0)
MCHC: 33.7 g/dL (ref 30.0–36.0)
MCV: 87.6 fL (ref 80.0–100.0)
Platelets: 258 10*3/uL (ref 150–400)
RBC: 4.2 MIL/uL (ref 3.87–5.11)
RDW: 14.3 % (ref 11.5–15.5)
WBC: 5.5 10*3/uL (ref 4.0–10.5)
nRBC: 0 % (ref 0.0–0.2)

## 2023-06-03 LAB — I-STAT CHEM 8, ED
BUN: 13 mg/dL (ref 8–23)
Calcium, Ion: 1.17 mmol/L (ref 1.15–1.40)
Chloride: 104 mmol/L (ref 98–111)
Creatinine, Ser: 1 mg/dL (ref 0.44–1.00)
Glucose, Bld: 111 mg/dL — ABNORMAL HIGH (ref 70–99)
HCT: 35 % — ABNORMAL LOW (ref 36.0–46.0)
Hemoglobin: 11.9 g/dL — ABNORMAL LOW (ref 12.0–15.0)
Potassium: 3.7 mmol/L (ref 3.5–5.1)
Sodium: 142 mmol/L (ref 135–145)
TCO2: 25 mmol/L (ref 22–32)

## 2023-06-03 LAB — BASIC METABOLIC PANEL
Anion gap: 7 (ref 5–15)
BUN: 15 mg/dL (ref 8–23)
CO2: 27 mmol/L (ref 22–32)
Calcium: 9.2 mg/dL (ref 8.9–10.3)
Chloride: 104 mmol/L (ref 98–111)
Creatinine, Ser: 0.61 mg/dL (ref 0.44–1.00)
GFR, Estimated: >60 mL/min (ref >60)
Glucose, Bld: 116 mg/dL — ABNORMAL HIGH (ref 70–99)
Potassium: 3.8 mmol/L (ref 3.5–5.1)
Sodium: 138 mmol/L (ref 135–145)

## 2023-06-03 LAB — HEPATIC FUNCTION PANEL
ALT: 27 U/L (ref 0–44)
AST: 24 U/L (ref 15–41)
Albumin: 4 g/dL (ref 3.5–5.0)
Alkaline Phosphatase: 58 U/L (ref 38–126)
Bilirubin, Direct: <0.1 mg/dL (ref 0.0–0.2)
Total Bilirubin: 0.5 mg/dL (ref 0.3–1.2)
Total Protein: 7.7 g/dL (ref 6.5–8.1)

## 2023-06-03 LAB — TROPONIN I (HIGH SENSITIVITY)
Troponin I (High Sensitivity): 3 ng/L (ref <18)
Troponin I (High Sensitivity): 3 ng/L (ref <18)

## 2023-06-03 MED ORDER — IOHEXOL 350 MG/ML SOLN
80.0000 mL | Freq: Once | INTRAVENOUS | Status: AC | PRN
  Administered 2023-06-03: 80 mL via INTRAVENOUS

## 2023-06-03 MED ORDER — SODIUM CHLORIDE (PF) 0.9 % IJ SOLN
INTRAMUSCULAR | Status: AC
  Filled 2023-06-03: qty 50

## 2023-06-03 MED ORDER — OXYCODONE HCL 5 MG PO TABS
10.0000 mg | ORAL_TABLET | ORAL | Status: AC
  Administered 2023-06-03: 10 mg via ORAL
  Filled 2023-06-03: qty 2

## 2023-06-03 MED ORDER — MORPHINE SULFATE (PF) 4 MG/ML IV SOLN
6.0000 mg | Freq: Once | INTRAVENOUS | Status: AC
  Administered 2023-06-03: 6 mg via INTRAVENOUS
  Filled 2023-06-03: qty 2

## 2023-06-03 MED ORDER — MORPHINE SULFATE (PF) 4 MG/ML IV SOLN: 8.0000 mg | Freq: Once | INTRAVENOUS | Status: DC

## 2023-06-03 NOTE — ED Triage Notes (Signed)
Patient woke up with left sided chest pain that moves down her left arm. No nausea or vomiting. History of blood clot in her legs. Has been under more stress the last 5 days.

## 2023-06-03 NOTE — Discharge Instructions (Signed)
You were evaluated here in the emergency department for chest pain.  You were evaluated with a CT angiogram of the lungs which did not show any evidence of pulmonary embolism or other sources from your lung or great vessels You were evaluated here with EKG and heart enzymes which did not show any evidence of heart attack Please return if you are unable to control pain with your home pain medicine, have new or worsening symptoms at any time. Please call your doctor for recheck in the next 1 to 3 days

## 2023-06-03 NOTE — ED Provider Notes (Signed)
[place] EMERGENCY DEPARTMENT AT [facility] Provider Note   CSN: [id number] Arrival date & time: 06/03/23  1004     History  Chief Complaint  Patient presents with   Chest Pain    [name] is a 62 y.o. female.  HPI 62 year old female presents today complaining of sharp left-sided chest pain that began around 830 this morning with a deep breath.  She has not had similar symptoms in the past.  She has some dyspnea.  She reports history of DVT in the past that was thought to be provoked by mobility.  She is not taking any blood thinner at this time.  She is on oxycodone 10 mg 4 times a day for chronic pain. Pain is worse with palpation and deep breathing     Home Medications Prior to Admission medications   Medication Sig Start Date End Date Taking? Authorizing Provider  allopurinol (ZYLOPRIM) 100 MG tablet Take 100 mg by mouth daily.    [provider]  aspirin 81 MG chewable tablet Chew 81 mg by mouth daily.    [provider]  atorvastatin (LIPITOR) 40 MG tablet Take 40 mg by mouth daily.    [provider]  Blood Glucose Monitoring Suppl (ACCU-CHEK GUIDE ME) w/Device KIT  09/09/20   [provider]  cholecalciferol (VITAMIN D3) 25 MCG (1000 UT) tablet Take 2,000 Units by mouth daily.    [provider]  cyclobenzaprine (FLEXERIL) 10 MG tablet Take 10 mg by mouth 2 (two) times daily as needed for muscle spasms.     [provider]  diclofenac sodium (VOLTAREN) 1 % GEL Apply 4 g topically 4 (four) times daily. 12/22/17   Maczis, Elmer Sow, PA-C  furosemide (LASIX) 20 MG tablet Take 20 mg by mouth daily.    [provider]  glucose blood test strip TEST GLUCOSE TWICE A DAY 04/05/17   [provider]  levothyroxine (SYNTHROID) 75 MCG tablet Take 1 tablet by mouth daily. 07/17/22   [provider]  lidocaine (XYLOCAINE) 5 % ointment Apply 1 application topically as needed for  moderate pain.    [provider]  lisinopril (ZESTRIL) 5 MG tablet Take 1 tablet by mouth daily. 03/26/22   [provider]  MELATONIN PO Take by mouth at bedtime as needed.    [provider]  metoprolol succinate (TOPROL-XL) 25 MG 24 hr tablet Take 1 tablet (25 mg total) by mouth daily. 04/29/23   Opalski, Gavin Pound, DO  naloxegol oxalate (MOVANTIK) 25 MG TABS tablet Take 25 mg by mouth daily.  04/02/17   [provider]  NARCAN 4 MG/0.1ML LIQD nasal spray kit SMARTSIG:1 Spray(s) Both Nares Once PRN 04/08/20   [provider]  oxyCODONE-acetaminophen (PERCOCET) 10-325 MG tablet Take 1 tablet by mouth 4 (four) times daily.     [provider]  OZEMPIC, 0.25 OR 0.5 MG/DOSE, 2 MG/1.5ML SOPN Inject into the skin. 07/16/20   [provider]  potassium chloride SA (K-DUR,KLOR-CON) 20 MEQ tablet Take 20 mEq by mouth daily.    [provider]  Vitamin D, Ergocalciferol, (DRISDOL) 1.25 MG (50000 UNIT) CAPS capsule Take 50,000 Units by mouth every 14 (fourteen) days. 09/06/20   [provider]      Allergies    Buprenorphine hcl-naloxone hcl, Naloxone, and Dilaudid [hydromorphone hcl]    Review of Systems   Review of Systems  Physical Exam Updated Vital Signs BP 101/71   Pulse (!) 56  Temp 97.8 F (36.6 C) (Oral)   Resp 18   Ht 1.676 m (5\' 6" )   Wt 119.7 kg   SpO2 100%   BMI 42.61 kg/m  Physical Exam Vitals and nursing note reviewed.  Constitutional:      Appearance: She is obese. She is not ill-appearing.  HENT:     Head: Normocephalic.  Eyes:     Pupils: Pupils are equal, round, and reactive to light.  Cardiovascular:     Rate and Rhythm: Normal rate and regular rhythm.     Heart sounds: Normal heart sounds.     Comments: Tenderness to palpation on left anterior chest Pulmonary:     Effort: Pulmonary effort is normal.     Breath sounds: Normal breath sounds.  Abdominal:     General: Bowel sounds are  normal.     Palpations: Abdomen is soft.  Musculoskeletal:        General: Normal range of motion.     Cervical back: Normal range of motion.     Right lower leg: No tenderness. No edema.     Left lower leg: No tenderness. No edema.  Skin:    General: Skin is warm and dry.     Capillary Refill: Capillary refill takes less than 2 seconds.  Neurological:     General: No focal deficit present.     Mental Status: She is alert.     ED Results / Procedures / Treatments   Labs (all labs ordered are listed, but only abnormal results are displayed) Labs Reviewed  BASIC METABOLIC PANEL - Abnormal; Notable for the following components:      Result Value   Glucose, Bld 116 (*)    All other components within normal limits  I-STAT CHEM 8, ED - Abnormal; Notable for the following components:   Glucose, Bld 111 (*)    Hemoglobin 11.9 (*)    HCT 35.0 (*)    All other components within normal limits  CBC  HEPATIC FUNCTION PANEL  TROPONIN I (HIGH SENSITIVITY)  TROPONIN I (HIGH SENSITIVITY)    EKG EKG Interpretation Date/Time:  Thursday June 03 2023 10:09:50 EDT Ventricular Rate:  67 PR Interval:  55 QRS Duration:  90 QT Interval:  389 QTC Calculation: 411 R Axis:   -14  Text Interpretation: Sinus rhythm Short PR interval Low voltage, precordial leads Abnormal R-wave progression, early transition Confirmed by Margarita Grizzle [phone]) on 06/03/2023 1:44:00 PM  Radiology CT Angio Chest PE W and/or Wo Contrast  Result Date: 06/03/2023 CLINICAL DATA:  Left-sided chest pain radiating down the left arm. History of DVT. EXAM: CT ANGIOGRAPHY CHEST WITH CONTRAST TECHNIQUE: Multidetector CT imaging of the chest was performed using the standard protocol during bolus administration of intravenous contrast. Multiplanar CT image reconstructions and MIPs were obtained to evaluate the vascular anatomy. RADIATION DOSE REDUCTION: This exam was performed according to the departmental dose-optimization  program which includes automated exposure control, adjustment of the mA and/or kV according to patient size and/or use of iterative reconstruction technique. CONTRAST:  80mL OMNIPAQUE IOHEXOL 350 MG/ML SOLN COMPARISON:  Chest x-Cylie Dor from same day. CT chest dated September 26, 2014. FINDINGS: Cardiovascular: Satisfactory opacification of the pulmonary arteries to the segmental level. No evidence of pulmonary embolism. Normal heart size. No pericardial effusion. Mediastinum/Nodes: No enlarged mediastinal, hilar, or axillary lymph nodes. Thyroid gland, trachea, and esophagus demonstrate no significant findings. Lungs/Pleura: No focal consolidation, pleural effusion, or pneumothorax. Upper Abdomen: No acute abnormality. Musculoskeletal: No chest wall abnormality.  No acute or significant osseous findings. Review of the MIP images confirms the above findings. IMPRESSION: 1. No evidence of pulmonary embolism. No acute intrathoracic process. Electronically Signed   By: Obie Dredge M.D.   On: 06/03/2023 11:58   DG Chest Port 1 View  Result Date: 06/03/2023 CLINICAL DATA:  Left-sided chest pain radiating into the left arm EXAM: PORTABLE CHEST 1 VIEW COMPARISON:  Chest radiograph dated 09/07/2014 FINDINGS: Normal lung volumes. No focal consolidations. No pleural effusion or pneumothorax. The heart size and mediastinal contours are within normal limits. No acute osseous abnormality. IMPRESSION: No active disease. Electronically Signed   By: Agustin Cree M.D.   On: 06/03/2023 10:45    Procedures Procedures    Medications Ordered in ED Medications  oxyCODONE (Oxy IR/ROXICODONE) immediate release tablet 10 mg (has no administration in time range)  iohexol (OMNIPAQUE) 350 MG/ML injection 80 mL (80 mLs Intravenous Contrast Given 06/03/23 1052)  morphine (PF) 4 MG/ML injection 6 mg (6 mg Intravenous Given 06/03/23 1334)    ED Course/ Medical Decision Making/ A&P Clinical Course as of 06/03/23 1437  Thu Jun 03, 2023   1218 CTA reviewed interpreted and no evidence of pulmonary embolism or other acute intrathoracic process on my evaluation and radiologist interpretation concurs [DR]  1219 CBC reviewed interpreted within normal limits [DR]  1219 Metabolic panel reviewed interpreted within normal limits [DR]  1219 Troponin I is 3 and will not need repeat as last pain was yesterday [DR]    Clinical Course User Index [DR] Margarita Grizzle, MD                                 Medical Decision Making Amount and/or Complexity of Data Reviewed Labs: ordered. Radiology: ordered.  Risk Prescription drug management.   62 year old female presents today complaining of sharp left-sided chest pain. Patient evaluated here with EKG, troponin, CTA.  Patient high risk for PE due to history of DVT.  Patient seen and evaluated for chest pain.  Differential diagnosis of serious/life threatening causes of chest pain includes ACS, other diseases of the heart such as myocarditis or pericarditis, lung etiologies such as infection or pneumothorax, diseases of the great vessels such as aortic dissection or AAA, pulmonary embolism, or GI sources such as cholecystitis or other upper abdominal causes. Doubt ACS- heart score documented, EKG reviewed, Given the timing of pain to ER presentation,  delta troponin* was 0 with #1 3, #2 3so doubt NSTEMI troponin and repeat troponin obtained and WNL Doubt myocarditis/pericarditis/tamponade based on history, review of ekg and labs Doubt aortic dissection based on history and review of imaging Doubt intrinsic lung causes such as pneumonia or pneumothorax, based on history, physical exam, and studies obtained. Doubt PE based on history, physical exam, and PERC Doubt acute GI etiology requiring intervention based on history, physical exam and labs. Patient appears stable for discharge. Return precautions and need for follow up discussed and patient voices understanding  Chest pain- ? Chest wall  - as per above no definitive acute etiology identified Stable anemia Chronic pain        Final Clinical Impression(s) / ED Diagnoses Final diagnoses:  Chest pain, unspecified type    Rx / DC Orders ED Discharge Orders     None         Margarita Grizzle, MD 06/03/23 1438

## 2023-07-12 ENCOUNTER — Ambulatory Visit (INDEPENDENT_AMBULATORY_CARE_PROVIDER_SITE_OTHER): Payer: Medicaid Other | Admitting: Family Medicine

## 2023-07-12 ENCOUNTER — Encounter (INDEPENDENT_AMBULATORY_CARE_PROVIDER_SITE_OTHER): Payer: Self-pay | Admitting: Family Medicine

## 2023-07-12 VITALS — BP 119/72 | HR 72 | Temp 98.4°F | Ht 66.0 in | Wt 264.0 lb

## 2023-07-12 DIAGNOSIS — Z6841 Body Mass Index (BMI) 40.0 and over, adult: Secondary | ICD-10-CM

## 2023-07-12 DIAGNOSIS — E559 Vitamin D deficiency, unspecified: Secondary | ICD-10-CM

## 2023-07-12 DIAGNOSIS — Z7985 Long-term (current) use of injectable non-insulin antidiabetic drugs: Secondary | ICD-10-CM

## 2023-07-12 DIAGNOSIS — E1159 Type 2 diabetes mellitus with other circulatory complications: Secondary | ICD-10-CM

## 2023-07-12 DIAGNOSIS — I152 Hypertension secondary to endocrine disorders: Secondary | ICD-10-CM | POA: Diagnosis not present

## 2023-07-12 DIAGNOSIS — E1169 Type 2 diabetes mellitus with other specified complication: Secondary | ICD-10-CM | POA: Diagnosis not present

## 2023-07-12 NOTE — Progress Notes (Signed)
[name], D.O.  ABFM, ABOM Specializing in Clinical Bariatric Medicine  Office located at: [address] [place], [place]  [address]     Assessment and Plan:  Vitamin D deficiency Assessment: Condition is At goal.. Pt level is within the recommended range and was last checked in October, 2023. She continue with ERGO once weekly and tolerates this medication well.  Lab Results  Component Value Date   VD25OH 82.9 07/29/2022   Plan:- Continue with Ergocalciferol 50K IU weekly and she was encouraged to continue to take the medicine until told otherwise.  She denies need for refill.   - We will continue to monitor levels to keep levels within normal limits and prevent over supplementation.   Type 2 diabetes mellitus with obesity (HCC) Assessment: Condition is Not at goal.. This is being treated with Ozempic once weekly. She endorses having no hunger and cravings on this and will even skip lunch if she is not hungry. She denies any N/V/D or adverse side effects. Lab Results  Component Value Date   HGBA1C 5.6 07/29/2022   HGBA1C 6.4 (H) 06/19/2013   HGBA1C 6.0 11/20/2009   INSULIN 8.2 07/29/2022    Plan: - Continue with Ozempic 0.5mg  as directed. She denies need for refill.   - Hypoglycemia prevention discussed with the patient.  Eat on a regular basis- no skipping or going long periods without eating.      - Pt went to [facility] and had labs done by her PCP, I informed her to bring in a hard copy of her lab results and will complete any missing labs next OV.   - Importance of f/up with PCP and all other specialists, as scheduled, was stressed to the patient today    Hypertension associated with type 2 diabetes mellitus (HCC) Assessment: Condition is Controlled.. This is being well controlled with Toprol, Lasix, and Lisinopril and tolerates this without difficulty. She has decreased her Toprol to half a tablet and has averaged a BP of 116/74. Her BP today  is well controlled. She denies any light-headiness and dizziness. Last 3 blood pressure readings in our office are as follows: BP Readings from Last 3 Encounters:  07/12/23 119/72  06/03/23 101/71  04/29/23 98/65    Plan: - Continue with Toprol XL, Lasix, and Lisinopril as directed.   - Shirleen Schirmer Mortellaro BP is 119/72 at goal today.   Lifestyle changes such as following our low salt, heart healthy meal plan and engaging in a regular exercise program discussed   - Avoid buying foods that are: processed, frozen, or prepackaged to avoid excess salt.  - Blood pressure monitoring encouraged.   - We will continue to monitor closely alongside PCP/ specialists.  Pt reminded to also f/up with those individuals as instructed by them.    TREATMENT PLAN FOR OBESITY: BMI 40.0-44.9, adult (HCC) - current BMI 42.63 OBESITY, MORBID with starting BMI 45 Assessment:  [name] is here to discuss her progress with her obesity treatment plan along with follow-up of her obesity related diagnoses. See Medical Weight Management Flowsheet for complete bioelectrical impedance results.  Condition is not optimized. Biometric data collected today, was reviewed with patient.   Since last office visit on 04/29/2023 patient's  Muscle mass has decreased by 4.4lb. Fat mass has increased by 7.6lb. Total body water has increased by 1.4lb.  Counseling done on how various foods will affect these numbers and how to maximize success  Total lbs lost to date:  16 Total weight loss percentage to date: 5.71%  Plan: - Latifa will work on healthier eating habits and try to follow the Category 3 nutritional plan with B and L options  best they can.   - I informed her to eat everything on plan and stop eating microwavable meals.   Behavioral Intervention Additional resources provided today: category 3 meal plan information, Recipes, breakfast options, and lunch options Evidence-based interventions for health  behavior change were utilized today including the discussion of self monitoring techniques, problem-solving barriers and SMART goal setting techniques.   Regarding patient's less desirable eating habits and patterns, we employed the technique of small changes.  Pt will specifically work on: Continue to follow the meal plan and bring a hard copy of labs for next visit.     She has agreed to Continue current level of physical activity    FOLLOW UP: Return in about 4 weeks (around 08/09/2023).  She was informed of the importance of frequent follow up visits to maximize her success with intensive lifestyle modifications for her multiple health conditions.  Subjective:   Chief complaint: Obesity Areli is here to discuss her progress with her obesity treatment plan. She is on the the Category 3 Plan with B and L options and states she is following her eating plan approximately 50% of the time. She states she is walking 30 minutes 2-3 days per week.  Interval History:  [name] is here for a follow up office visit.     Since last office visit:  She was last seen on 04/29/2023. She has been dealing with family situations. She has returned from a cruise and notes walking a lot and drinking a lot of water during this time. She states that the food on her trip was not good and consumed a lot of fruit. She endorses skipping some meals occasionally and eating microwavable meals.   Pharmacotherapy for weight loss: She is currently taking  Ozempic  for medical weight loss.  Denies side effects.    Review of Systems:  Pertinent positives were addressed with patient today.  Reviewed by clinician on day of visit: allergies, medications, problem list, medical history, surgical history, family history, social history, and previous encounter notes.  Weight Summary and Biometrics   Weight Lost Since Last Visit: 0lb  Weight Gained Since Last Visit: 3lb   Vitals Temp: 98.4 F (36.9  C) BP: 119/72 Pulse Rate: 72 SpO2: 99 %   Anthropometric Measurements Height: 5\' 6"  (1.676 m) Weight: 264 lb (119.7 kg) BMI (Calculated): 42.63 Weight at Last Visit: 261lb Weight Lost Since Last Visit: 0lb Weight Gained Since Last Visit: 3lb Starting Weight: 280lb Total Weight Loss (lbs): 16 lb (7.258 kg) Peak Weight: 330   Body Composition  Body Fat %: 48.3 % Fat Mass (lbs): 127.8 lbs Muscle Mass (lbs): 130 lbs Total Body Water (lbs): 93 lbs Visceral Fat Rating : 17   Other Clinical Data Fasting: no Labs: no Today's Visit #: 11 Starting Date: 08/08/22     Objective:   PHYSICAL EXAM: Blood pressure 119/72, pulse 72, temperature 98.4 F (36.9 C), height 5\' 6"  (1.676 m), weight 264 lb (119.7 kg), SpO2 99%. Body mass index is 42.61 kg/m.  General: Well Developed, well nourished, and in no acute distress.  HEENT: Normocephalic, atraumatic Skin: Warm and dry, cap RF less 2 sec, good turgor Chest:  Normal excursion, shape, no gross abn Respiratory: speaking in full sentences, no conversational dyspnea NeuroM-Sk: Ambulates w/o assistance, moves *  4 Psych: A and O *3, insight good, mood-full  DIAGNOSTIC DATA REVIEWED:  BMET    Component Value Date/Time   NA 142 06/03/2023 1030   K 3.7 06/03/2023 1030   CL 104 06/03/2023 1030   CO2 27 06/03/2023 1019   GLUCOSE 111 (H) 06/03/2023 1030   BUN 13 06/03/2023 1030   CREATININE 1.00 06/03/2023 1030   CREATININE 1.00 12/25/2022 0758   CREATININE 1.06 (H) 04/18/2021 0905   CALCIUM 9.2 06/03/2023 1019   GFRNONAA >60 06/03/2023 1019   GFRNONAA >60 12/25/2022 0758   GFRNONAA 57 (L) 04/18/2021 0905   GFRAA 66 04/18/2021 0905   Lab Results  Component Value Date   HGBA1C 5.6 07/29/2022   HGBA1C 6.0 11/20/2009   Lab Results  Component Value Date   INSULIN 8.2 07/29/2022   Lab Results  Component Value Date   TSH 0.589 07/29/2022   CBC    Component Value Date/Time   WBC 5.5 06/03/2023 1019   RBC 4.20  06/03/2023 1019   HGB 11.9 (L) 06/03/2023 1030   HGB 12.8 12/25/2022 0758   HCT 35.0 (L) 06/03/2023 1030   PLT 258 06/03/2023 1019   PLT 268 12/25/2022 0758   MCV 87.6 06/03/2023 1019   MCH 29.5 06/03/2023 1019   MCHC 33.7 06/03/2023 1019   RDW 14.3 06/03/2023 1019   Iron Studies No results found for: "IRON", "TIBC", "FERRITIN", "IRONPCTSAT" Lipid Panel     Component Value Date/Time   CHOL 129 07/29/2022 0930   TRIG 92 07/29/2022 0930   HDL 59 07/29/2022 0930   CHOLHDL 2.2 07/29/2022 0930   CHOLHDL 5.0 Ratio 10/07/2009 1913   VLDL 38 10/07/2009 1913   LDLCALC 53 07/29/2022 0930   Hepatic Function Panel     Component Value Date/Time   PROT 7.7 06/03/2023 1019   ALBUMIN 4.0 06/03/2023 1019   AST 24 06/03/2023 1019   AST 22 12/25/2022 0758   ALT 27 06/03/2023 1019   ALT 24 12/25/2022 0758   ALKPHOS 58 06/03/2023 1019   BILITOT 0.5 06/03/2023 1019   BILITOT 0.5 12/25/2022 0758   BILIDIR <0.1 06/03/2023 1019   IBILI NOT CALCULATED 06/03/2023 1019      Component Value Date/Time   TSH 0.589 07/29/2022 0930   Nutritional Lab Results  Component Value Date   VD25OH 82.9 07/29/2022    Attestations:   This encounter took 30 total minutes of time including any pre-visit and post-visit time spent on this date of service, including taking a thorough history, reviewing any labs and/or imaging, reviewing prior notes, as well as documenting in the electronic health record on the date of service. Over 50% of that time was in direct face-to-face counseling and coordinating care for the patient today  I, Clinical biochemist, acting as a Stage manager for [employer], DO., have compiled all relevant documentation for today's office visit on behalf of Thomasene Lot, DO, while in the presence of [employer], DO.  I have reviewed the above documentation for accuracy and completeness, and I agree with the above. [name], D.O.  The 21st Century Cures Act was signed  into law in 2016 which includes the topic of electronic health records.  This provides immediate access to information in MyChart.  This includes consultation notes, operative notes, office notes, lab results and pathology reports.  If you have any questions about what you read please let us know at your next visit so we can discuss your concerns and take corrective action  if need be.  We are right here with you.

## 2023-07-22 LAB — COMPREHENSIVE METABOLIC PANEL
Albumin: 4 (ref 3.5–5.0)
Calcium: 9.1 (ref 8.7–10.7)
Globulin: 3

## 2023-07-22 LAB — LIPID PANEL
Cholesterol: 133 (ref 0–200)
HDL: 56 (ref 35–70)
LDL Cholesterol: 63
LDl/HDL Ratio: 14
Triglycerides: 72 (ref 40–160)

## 2023-07-22 LAB — PROTEIN / CREATININE RATIO, URINE
Albumin, U: <3
Creatinine, Urine: 70.3

## 2023-07-22 LAB — TSH
A1c: 5.3
Albumin, Urine POC: 3
Albumin/Creatinine Ratio, Urine, POC: 4
Creatinine, POC: 70.3 mg/dL
EGFR: 84
TSH: 1.79 (ref 0.41–5.90)

## 2023-07-22 LAB — HEPATIC FUNCTION PANEL
ALT: 58 U/L — AB (ref 7–35)
AST: 28 (ref 13–35)
Alkaline Phosphatase: 58 (ref 25–125)
Bilirubin, Total: 0.5

## 2023-07-22 LAB — BASIC METABOLIC PANEL
BUN: 11 (ref 4–21)
CO2: 32 — AB (ref 13–22)
Chloride: 106 (ref 99–108)
Creatinine: 0.8 (ref 0.5–1.1)
Glucose: 79
Potassium: 4.7 meq/L (ref 3.5–5.1)
Sodium: 143 (ref 137–147)

## 2023-07-22 LAB — HEMOGLOBIN A1C: Hemoglobin A1C: 5.3

## 2023-07-22 LAB — CBC AND DIFFERENTIAL
HCT: 36 (ref 36–46)
Hemoglobin: 11.8 — AB (ref 12.0–16.0)
Platelets: 294 10*3/uL (ref 150–400)
WBC: 5

## 2023-07-22 LAB — HM HIV SCREENING LAB: HM HIV Screening: NEGATIVE

## 2023-07-22 LAB — CBC: RBC: 4.05 (ref 3.87–5.11)

## 2023-07-22 LAB — MICROALBUMIN / CREATININE URINE RATIO: Microalb Creat Ratio: <4

## 2023-07-23 ENCOUNTER — Ambulatory Visit: Payer: Medicaid Other | Admitting: Orthopaedic Surgery

## 2023-07-23 VITALS — Ht 66.5 in | Wt 265.0 lb

## 2023-07-23 DIAGNOSIS — M1612 Unilateral primary osteoarthritis, left hip: Secondary | ICD-10-CM

## 2023-07-23 DIAGNOSIS — Z6841 Body Mass Index (BMI) 40.0 and over, adult: Secondary | ICD-10-CM | POA: Diagnosis not present

## 2023-07-23 NOTE — Progress Notes (Signed)
Office Visit Note   Patient: [name]           Date of Birth: [birth year]           MRN: [id number] Visit Date: 07/23/2023              Requested by: Shanna Cisco, NP [address] [place],  [place] [address] PCP: Shanna Cisco, NP   Assessment & Plan: Visit Diagnoses:  1. Primary osteoarthritis of left hip   2. Body mass index 40.0-44.9, adult (HCC)     Plan: Impression is advanced left hip osteoarthritis.  Today, we discussed various treatment options to include total hip arthroplasty as cortisone injections have not provided relief in the past.  Unfortunately, she has a BMI of 42.13.  She will need to get to a weight of 250 pounds or less in order to attain a BMI of less than 40 to safely proceed with surgery.  She understands and agrees.  She will follow-up with [place] once she has reached this goal.  Follow-Up Instructions: Return if symptoms worsen or fail to improve.   Orders:  No orders of the defined types were placed in this encounter.  No orders of the defined types were placed in this encounter.     Procedures: No procedures performed   Clinical Data: No additional findings.   Subjective: Chief Complaint  Patient presents with   Left Hip - Pain    HPI patient is a pleasant 62 year old female who comes in today with continued left hip pain.  History of advanced osteoarthritis.  She describes pain is constant which is located in the groin and anterior thigh.  Symptoms are worse with movement.  She does take oxycodone for which she gets from pain management.  She has tried intra-articular cortisone injections to the left hip without relief.  She has been in weight management and has lost a few pounds.  Review of Systems as detailed in HPI.  All others reviewed and are negative.   Objective: Vital Signs: Ht 5' 6.5" (1.689 m)   Wt 265 lb (120.2 kg)   BMI 42.13 kg/m   Physical Exam well-developed well-nourished female no acute distress.   Alert and oriented x 3.  Ortho Exam left hip exam: Pain with logroll and FADIR testing.  Pain with Stinchfield.  She is neurovascularly intact distally.  Specialty Comments:  MRI LUMBAR SPINE WITHOUT CONTRAST   TECHNIQUE: Multiplanar, multisequence MR imaging of the lumbar spine was performed. No intravenous contrast was administered.   COMPARISON:  Prior radiograph from a 02/02/2021 and MRI from 07/28/2017.   FINDINGS: Segmentation: Standard. Lowest well-formed disc space labeled the L5-S1 level.   Alignment: Exaggeration of the normal lumbar lordosis. Trace stepwise retrolisthesis of T12 on L1 and L1 on L2. Trace facet mediated anterolisthesis of L4 on L5.   Vertebrae: Vertebral body height maintained without acute or chronic fracture. Bone marrow signal intensity heterogeneous but overall within normal limits. No discrete or worrisome osseous lesions. No abnormal marrow edema.   Conus medullaris and cauda equina: Conus extends to the L1-2 level. Conus and cauda equina appear normal.   Paraspinal and other soft tissues: Paraspinous soft tissues within normal limits. Few small benign appearing cyst noted about the visualized kidneys. Visualized visceral structures otherwise unremarkable.   Disc levels:   T11-12: Mild disc bulge with disc desiccation. Left greater than right facet hypertrophy. No significant spinal stenosis. Mild left foraminal narrowing. Right neural foramen remains patent.  T12-L1: Mild disc bulge with disc desiccation. No spinal stenosis. Foramina remain patent.   L1-2: Intervertebral disc space narrowing with diffuse disc bulge and disc desiccation. Superimposed right foraminal to extraforaminal disc protrusion (series 6, image 19). No spinal stenosis. Mild right L1 foraminal narrowing. Left neural foramen remains patent.   L2-3: Negative interspace. Mild facet and ligament flavum hypertrophy. No canal or foraminal stenosis.   L3-4: Negative  interspace. Mild to moderate facet and ligament flavum hypertrophy. No spinal stenosis. Mild left greater than right L3 foraminal narrowing.   L4-5: Trace anterolisthesis. Mild disc bulge with disc desiccation. Superimposed small right foraminal disc protrusion closely approximates and/or contacts the exiting right L4 nerve root (series 6, image 37). Advanced bilateral facet arthrosis with ligament flavum hypertrophy. Resultant mild canal with moderate bilateral subarticular stenosis. Moderate bilateral L4 foraminal narrowing, worse on the right.   L5-S1: Disc desiccation with diffuse disc bulge. Mild reactive endplate spurring. Moderate left worse than right facet hypertrophy. No significant spinal stenosis. Moderate left worse than right L5 foraminal stenosis.   IMPRESSION: 1. Multifactorial degenerative changes at L4-5 with resultant mild canal with moderate bilateral subarticular stenosis, with moderate bilateral L4 foraminal narrowing. Superimposed right foraminal disc protrusion at this level could potentially affect the exiting right L4 nerve root. 2. Disc bulging with facet hypertrophy at L5-S1 with resultant moderate left worse than right L5 foraminal stenosis. 3. Additional mild noncompressive disc bulging with facet hypertrophy at L1-2 and L2-3 without significant stenosis or neural impingement.     Electronically Signed   By: Rise Mu M.D.   On: 12/20/2021 03:21  Imaging: No results found.   PMFS History: Patient Active Problem List   Diagnosis Date Noted   Prediabetes 02/22/2023   Primary osteoarthritis of both hips 12/15/2022   Central adiposity 10/20/2022   Vitamin D deficiency 08/11/2022   Other fatigue 07/29/2022   SOBOE (shortness of breath on exertion) 07/29/2022   Osteoarthritis of left hip 07/29/2022   Other hyperlipidemia 07/29/2022   Depression screening 07/29/2022   Paresthesia 06/30/2022   Peripheral neuropathy 05/13/2022   Pain  in left wrist 04/24/2022   Carpal tunnel syndrome, bilateral 03/18/2022   Plantar fasciitis of right foot 09/03/2021   Pes planus 09/03/2021   Nuclear sclerotic cataract of both eyes 05/27/2021   Diabetes mellitus without complication (HCC) 05/27/2021   Vitreomacular adhesion of both eyes 05/27/2021   Antiphospholipid antibody positive 05/21/2021   Pain in left elbow 02/19/2021   Chronic left-sided low back pain with left-sided sciatica 07/22/2017   Thyroid nodule 04/16/2017   Essential hypertension 03/13/2014   Menometrorrhagia    Dysmenorrhea    Anemia    Hyperlipidemia 05/23/2007   OBESITY, MORBID with starting BMI 45 05/23/2007   DYSFUNCTIONAL UTERINE BLEEDING - s/p LAVH 05/23/2007   ROTATOR CUFF SYNDROME 05/23/2007   KNEE PAIN, HX OF 05/23/2007   Past Medical History:  Diagnosis Date   Anemia    Anxiety    Arthritis    Back pain    Chest pain    Diabetes mellitus without complication (HCC)    recent dx diet controlled   DVT (deep venous thrombosis) (HCC)    remote - took coumadin   Dysmenorrhea    Dysrhythmia    irregular heart beat   Edema    Fatty liver    Fibroid    GERD (gastroesophageal reflux disease)    hx of   Gout    Hypercholesteremia    Hypertension 4 months  Hypothyroid    Joint pain    Menometrorrhagia    Morbid obesity (HCC)    Osteoarthritis    Palpitations    Prediabetes    Substance abuse (HCC)    Vitamin D deficiency     Family History  Problem Relation Age of Onset   Thyroid disease Mother    Hyperlipidemia Mother    Diabetes Mother    Hypertension Mother    Cancer Mother    Deep vein thrombosis Mother        and PE   Heart disease Mother    Kidney disease Mother    Diabetes Father    Hypertension Father    Cancer Father 13   Throat cancer Brother 38   Pulmonary embolism Daughter    Heart disease Other        No family history    Past Surgical History:  Procedure Laterality Date   ABDOMINAL HYSTERECTOMY  2008   BREAST  BIOPSY Left 2013   benign   BUNIONECTOMY Bilateral    CHOLECYSTECTOMY     COLONOSCOPY WITH PROPOFOL N/A 10/27/2018   Procedure: COLONOSCOPY WITH PROPOFOL;  Surgeon: Graylin Shiver, MD;  Location: [facility];  Service: Endoscopy;  Laterality: N/A;   ECTOPIC PREGNANCY SURGERY  1980's   MANDIBLE OSTEOTOMY Bilateral 07/03/2013   Procedure: BILATERAL TORI;  Surgeon: Georgia Lopes, DDS;  Location: [facility] OR;  Service: Oral Surgery;  Laterality: Bilateral;   NM MYOCAR PERF WALL MOTION  07/2012   lexiscan - normal pattern of perfusion, low risk   ROTATOR CUFF REPAIR Bilateral 2009   SLEEP STUDY  07/27/2012   AHI 4.8/hr   TOOTH EXTRACTION N/A 07/03/2013   Procedure: DENTAL EXTRACTION # 20;  Surgeon: Georgia Lopes, DDS;  Location: [facility] OR;  Service: Oral Surgery;  Laterality: N/A;   TRANSTHORACIC ECHOCARDIOGRAM  07/2012   EF=>55%, mod conc LVH; LA mod dilated; trace MR; mild TR with normal RVSP; trace AV regurg   TUBAL LIGATION     Social History   Occupational History    Employer: UNEMPLOYED    Comment: Disability  Tobacco Use   Smoking status: Former    Current packs/day: 0.00    Average packs/day: 0.1 packs/day for 6.0 years (0.6 ttl pk-yrs)    Types: Cigarettes    Start date: 02/13/2005    Quit date: 02/14/2011    Years since quitting: 12.4   Smokeless tobacco: Never  Vaping Use   Vaping status: Never Used  Substance and Sexual Activity   Alcohol use: No    Comment: Previous ETOH abuse   Drug use: No    Comment: Previous crack use    Sexual activity: Never    Birth control/protection: Abstinence

## 2023-08-09 ENCOUNTER — Telehealth (INDEPENDENT_AMBULATORY_CARE_PROVIDER_SITE_OTHER): Payer: Self-pay

## 2023-08-09 ENCOUNTER — Encounter (INDEPENDENT_AMBULATORY_CARE_PROVIDER_SITE_OTHER): Payer: Self-pay

## 2023-08-09 ENCOUNTER — Ambulatory Visit (INDEPENDENT_AMBULATORY_CARE_PROVIDER_SITE_OTHER): Payer: Medicaid Other | Admitting: Family Medicine

## 2023-08-09 ENCOUNTER — Telehealth (INDEPENDENT_AMBULATORY_CARE_PROVIDER_SITE_OTHER): Payer: Self-pay | Admitting: Family Medicine

## 2023-08-09 ENCOUNTER — Encounter (INDEPENDENT_AMBULATORY_CARE_PROVIDER_SITE_OTHER): Payer: Self-pay | Admitting: Family Medicine

## 2023-08-09 VITALS — BP 116/73 | HR 72 | Temp 98.8°F | Ht 66.0 in | Wt 266.0 lb

## 2023-08-09 DIAGNOSIS — E669 Obesity, unspecified: Secondary | ICD-10-CM

## 2023-08-09 DIAGNOSIS — E559 Vitamin D deficiency, unspecified: Secondary | ICD-10-CM | POA: Diagnosis not present

## 2023-08-09 DIAGNOSIS — I152 Hypertension secondary to endocrine disorders: Secondary | ICD-10-CM | POA: Diagnosis not present

## 2023-08-09 DIAGNOSIS — Z7985 Long-term (current) use of injectable non-insulin antidiabetic drugs: Secondary | ICD-10-CM

## 2023-08-09 DIAGNOSIS — Z6841 Body Mass Index (BMI) 40.0 and over, adult: Secondary | ICD-10-CM

## 2023-08-09 DIAGNOSIS — E1169 Type 2 diabetes mellitus with other specified complication: Secondary | ICD-10-CM | POA: Diagnosis not present

## 2023-08-09 DIAGNOSIS — E1159 Type 2 diabetes mellitus with other circulatory complications: Secondary | ICD-10-CM

## 2023-08-09 MED ORDER — SEMAGLUTIDE (1 MG/DOSE) 4 MG/3ML ~~LOC~~ SOPN: 1.0000 mg | PEN_INJECTOR | SUBCUTANEOUS | 0 refills | Status: DC

## 2023-08-09 NOTE — Telephone Encounter (Signed)
Called pt. Message said unable to complete call. Left patient Mychart message

## 2023-08-09 NOTE — Telephone Encounter (Signed)
Pt wants her Ozempic changed back to 0.5 instead of 1.0. Pt states that her blood sugar would go too low with 1.0. Please call the patient at [phone]. [facility].

## 2023-08-09 NOTE — Progress Notes (Signed)
[name], D.O.  ABFM, ABOM Specializing in Clinical Bariatric Medicine  Office located at: [address] [place], [place]  [address]     Assessment and Plan:  Draw vitamin D level next OV.  Medications Discontinued During This Encounter  Medication Reason   OZEMPIC, 0.25 OR 0.5 MG/DOSE, 2 MG/1.5ML SOPN     Meds ordered this encounter  Medications   Semaglutide, 1 MG/DOSE, 4 MG/3ML SOPN    Sig: Inject 1 mg as directed once a week.    Dispense:  3 mL    Refill:  0    Type 2 diabetes mellitus with obesity (HCC) Assessment: Condition is Not at goal.. She continues Ozempic without difficulty. She endorses having some sugar cravings only but combats this with eating Yasso bars. Pt A1c level went from 5.6 to 5.3 as of 07/08/2023. She does not check her blood sugar at. Her TSH, T4, free T4, fasting lipid profile, CBC, and CMP were normal on 07/08/2023.  Lab Results  Component Value Date   HGBA1C 5.6 07/29/2022   HGBA1C 6.4 (H) 06/19/2013   HGBA1C 6.0 11/20/2009   INSULIN 8.2 07/29/2022    Plan: -  Increase Ozempic to 1mg  as directed. I explained the risks and benefits of increasing this dosage and the potential side effects if she eats off plan.   - I intensive lifestyle modification including diet, exercise and weight loss are the first line of treatment for diabetes. We extensively discussed the importance of decreasing simple carbs, increasing proteins and how certain foods they eat will affect their blood sugars  - Recheck labs in 3 months if not done at Endo provider / PCP.   Labs were reviewed with patient today and education provided on them. We discussed how the foods patient eats may influence these laboratory findings.  All of the patient's questions about them were answered    Vitamin D deficiency Assessment: Condition is Controlled.. Pt vitamin D has been controlled since last year. Patient reports good compliance and tolerance of taking Ergocalciferol once  biweekly.  Lab Results  Component Value Date   VD25OH 82.9 07/29/2022   Plan: - Continue with ERGO 50K lU as directed and she was encouraged to continue to take the medicine until told otherwise.  She denies need for refill.   - Since her PCP did not do vitamin D labs we will recheck next her vitamin D levels next OV.   - pt's questions and concerns regarding this condition addressed.    Hypertension associated with type 2 diabetes mellitus (HCC) Assessment: Condition is Controlled.. She monitors her BP at home. Pt notes since decreasing her Metoprolol her BP has been controlled. She continues Lasix, Lisinopirl, and Metoprolol. Pt denies any adverse side effects.  Last 3 blood pressure readings in our office are as follows: BP Readings from Last 3 Encounters:  08/09/23 116/73  07/12/23 119/72  06/03/23 101/71   Plan:- Continue with Lasix, Lisinopirl, and Metoprolol.  - [name] BP is 11/673 at goal today.   - Continue to avoid buying and eating processed, prepackages, and frozen foods high in sodium.   - Ambulatory blood pressure monitoring encouraged.  - We will continue to monitor closely alongside PCP/ specialists.  Pt reminded to also f/up with those individuals as instructed by them.   - We will continue to monitor symptoms as they relate to the her weight loss journey.   TREATMENT PLAN FOR OBESITY: BMI 40.0-44.9, adult (HCC) - current BMI  42.95 OBESITY, MORBID with starting BMI 45 Assessment:  [name] is here to discuss her progress with her obesity treatment plan along with follow-up of her obesity related diagnoses. See Medical Weight Management Flowsheet for complete bioelectrical impedance results.  Condition is not optimized. Biometric data collected today, was reviewed with patient.   Since last office visit on 07/12/2023 patient's  Muscle mass has decreased by 6.2lb. Fat mass has increased by 8.4lb. Total body water has increased by  1lb.  Counseling done on how various foods will affect these numbers and how to maximize success  Total lbs lost to date: 14 Total weight loss percentage to date: 5.00%   Plan: - Continue to follow the Category 3 Plan with B and L options and with 8-10oz at dinner   - I recommend NYX and Halo top ice cream.   Behavioral Intervention Additional resources provided today: category 3 meal plan information Evidence-based interventions for health behavior change were utilized today including the discussion of self monitoring techniques, problem-solving barriers and SMART goal setting techniques.   Regarding patient's less desirable eating habits and patterns, we employed the technique of small changes.  Pt will specifically work on: weigh her protein and measure her veggies for next visit.     She has agreed to Continue current level of physical activity  and Think about enjoyable ways to increase daily physical activity and overcoming barriers to exercise   FOLLOW UP: Return in about 4 weeks (around 09/06/2023).  She was informed of the importance of frequent follow up visits to maximize her success with intensive lifestyle modifications for her multiple health conditions.  Subjective:   Chief complaint: Obesity [name] is here to discuss her progress with her obesity treatment plan. She is on the Category 3 Plan with B and L options and states she is following her eating plan approximately 90% of the time. She states she is walking 30-45 minutes 4 days per week.  Interval History:  [name] is here for a follow up office visit.     Since last office visit:  She informed me that she has been having hip pain and her doctor recommended her receiving an injection to help her arthritic pain. She declined this as she states it doesn't work and would prefer to loss weight to help decrease pain. She endorses eating healthy sweet alternatives such as Yasso bars. She endorses eating off  plan occasionally when her grandchildren come over. She notes not getting enough protein.   We reviewed her meal plan and all questions were answered.   Pharmacotherapy for weight loss: She is currently taking  Ozempic  for medical weight loss.  Denies side effects.    Review of Systems:  Pertinent positives were addressed with patient today.  Reviewed by clinician on day of visit: allergies, medications, problem list, medical history, surgical history, family history, social history, and previous encounter notes.  Weight Summary and Biometrics   Weight Lost Since Last Visit: 0lb  Weight Gained Since Last Visit: 2lb   Vitals Temp: 98.8 F (37.1 C) BP: 116/73 Pulse Rate: 72 SpO2: 99 %   Anthropometric Measurements Height: 5\' 6"  (1.676 m) Weight: 266 lb (120.7 kg) BMI (Calculated): 42.95 Weight at Last Visit: 264lb Weight Lost Since Last Visit: 0lb Weight Gained Since Last Visit: 2lb Starting Weight: 280lb Total Weight Loss (lbs): 14 lb (6.35 kg) Peak Weight: 330lb   Body Composition  Body Fat %: 51.1 % Fat Mass (lbs): 136.2 lbs  Muscle Mass (lbs): 123.8 lbs Total Body Water (lbs): 94 lbs Visceral Fat Rating : 18   Other Clinical Data Fasting: no Labs: no Today's Visit #: 12 Starting Date: 08/08/22     Objective:   PHYSICAL EXAM: Blood pressure 116/73, pulse 72, temperature 98.8 F (37.1 C), height 5\' 6"  (1.676 m), weight 266 lb (120.7 kg), SpO2 99%. Body mass index is 42.93 kg/m.  General: Well Developed, well nourished, and in no acute distress.  HEENT: Normocephalic, atraumatic Skin: Warm and dry, cap RF less 2 sec, good turgor Chest:  Normal excursion, shape, no gross abn Respiratory: speaking in full sentences, no conversational dyspnea NeuroM-Sk: Ambulates w/o assistance, moves * 4 Psych: A and O *3, insight good, mood-full  DIAGNOSTIC DATA REVIEWED:  BMET    Component Value Date/Time   NA 142 06/03/2023 1030   K 3.7 06/03/2023 1030   CL  104 06/03/2023 1030   CO2 27 06/03/2023 1019   GLUCOSE 111 (H) 06/03/2023 1030   BUN 13 06/03/2023 1030   CREATININE 1.00 06/03/2023 1030   CREATININE 1.00 12/25/2022 0758   CREATININE 1.06 (H) 04/18/2021 0905   CALCIUM 9.2 06/03/2023 1019   GFRNONAA >60 06/03/2023 1019   GFRNONAA >60 12/25/2022 0758   GFRNONAA 57 (L) 04/18/2021 0905   GFRAA 66 04/18/2021 0905   Lab Results  Component Value Date   HGBA1C 5.6 07/29/2022   HGBA1C 6.0 11/20/2009   Lab Results  Component Value Date   INSULIN 8.2 07/29/2022   Lab Results  Component Value Date   TSH 0.589 07/29/2022   CBC    Component Value Date/Time   WBC 5.5 06/03/2023 1019   RBC 4.20 06/03/2023 1019   HGB 11.9 (L) 06/03/2023 1030   HGB 12.8 12/25/2022 0758   HCT 35.0 (L) 06/03/2023 1030   PLT 258 06/03/2023 1019   PLT 268 12/25/2022 0758   MCV 87.6 06/03/2023 1019   MCH 29.5 06/03/2023 1019   MCHC 33.7 06/03/2023 1019   RDW 14.3 06/03/2023 1019   Iron Studies No results found for: "IRON", "TIBC", "FERRITIN", "IRONPCTSAT" Lipid Panel     Component Value Date/Time   CHOL 129 07/29/2022 0930   TRIG 92 07/29/2022 0930   HDL 59 07/29/2022 0930   CHOLHDL 2.2 07/29/2022 0930   CHOLHDL 5.0 Ratio 10/07/2009 1913   VLDL 38 10/07/2009 1913   LDLCALC 53 07/29/2022 0930   Hepatic Function Panel     Component Value Date/Time   PROT 7.7 06/03/2023 1019   ALBUMIN 4.0 06/03/2023 1019   AST 24 06/03/2023 1019   AST 22 12/25/2022 0758   ALT 27 06/03/2023 1019   ALT 24 12/25/2022 0758   ALKPHOS 58 06/03/2023 1019   BILITOT 0.5 06/03/2023 1019   BILITOT 0.5 12/25/2022 0758   BILIDIR <0.1 06/03/2023 1019   IBILI NOT CALCULATED 06/03/2023 1019      Component Value Date/Time   TSH 0.589 07/29/2022 0930   Nutritional Lab Results  Component Value Date   VD25OH 82.9 07/29/2022    Attestations:   I, Clinical biochemist, acting as a Stage manager for [employer], DO., have compiled all relevant documentation for  today's office visit on behalf of Thomasene Lot, DO, while in the presence of [employer], DO.  I have reviewed the above documentation for accuracy and completeness, and I agree with the above. [name], D.O.  The 21st Century Cures Act was signed into law in 2016 which includes the topic of electronic health  records.  This provides immediate access to information in MyChart.  This includes consultation notes, operative notes, office notes, lab results and pathology reports.  If you have any questions about what you read please let us know at your next visit so we can discuss your concerns and take corrective action if need be.  We are right here with you.

## 2023-08-09 NOTE — Telephone Encounter (Signed)
Spoke to patient informed that Dr Sharee Holster stated that Ozempic should not have an effect on her blood glucose readings. Pt understood and will begin the 1mg  dosage. If she were to have any fluctuations in blood glucose readings patient will inform us.

## 2023-08-25 ENCOUNTER — Telehealth: Payer: Self-pay | Admitting: Physical Medicine and Rehabilitation

## 2023-08-25 NOTE — Telephone Encounter (Signed)
Patient call and needs to get an appointment for her left shoulder. CB#[phone]

## 2023-08-31 ENCOUNTER — Other Ambulatory Visit (INDEPENDENT_AMBULATORY_CARE_PROVIDER_SITE_OTHER): Payer: Medicaid Other

## 2023-08-31 ENCOUNTER — Ambulatory Visit: Payer: Medicaid Other | Admitting: Physician Assistant

## 2023-08-31 ENCOUNTER — Encounter: Payer: Self-pay | Admitting: Physician Assistant

## 2023-08-31 DIAGNOSIS — M25512 Pain in left shoulder: Secondary | ICD-10-CM

## 2023-08-31 MED ORDER — BUPIVACAINE HCL 0.25 % IJ SOLN
2.0000 mL | INTRAMUSCULAR | Status: AC | PRN
Start: 2023-08-31 — End: 2023-08-31
  Administered 2023-08-31: 2 mL via INTRA_ARTICULAR

## 2023-08-31 MED ORDER — LIDOCAINE HCL 2 % IJ SOLN
2.0000 mL | INTRAMUSCULAR | Status: AC | PRN
Start: 2023-08-31 — End: 2023-08-31
  Administered 2023-08-31: 2 mL

## 2023-08-31 MED ORDER — METHYLPREDNISOLONE ACETATE 40 MG/ML IJ SUSP
40.0000 mg | INTRAMUSCULAR | Status: AC | PRN
Start: 2023-08-31 — End: 2023-08-31
  Administered 2023-08-31: 40 mg via INTRA_ARTICULAR

## 2023-08-31 NOTE — Progress Notes (Signed)
Office Visit Note   Patient: [name]           Date of Birth: Dec 04, 1960           MRN: [id number] Visit Date: 08/31/2023              Requested by: Shanna Cisco, NP [address] [place],  [place] [address] PCP: Shanna Cisco, NP   Assessment & Plan: Visit Diagnoses:  1. Acute pain of left shoulder     Plan: Impression is left shoulder pain.  At this point, not concerned for rotator cuff tear based on her clinical exam and lack of injury.  We have discussed trying a cortisone injection into the subacromial space for which she would like to proceed.  If her symptoms do not improve over the next several weeks we will go ahead and order an MR arthrogram to assess her rotator cuff.  Otherwise, follow-up as needed.  Follow-Up Instructions: Return if symptoms worsen or fail to improve.   Orders:  Orders Placed This Encounter  Procedures   Large Joint Inj   XR Shoulder Left   No orders of the defined types were placed in this encounter.     Procedures: Large Joint Inj: L subacromial bursa on 08/31/2023 2:14 PM Indications: pain Details: 22 G needle Medications: 2 mL lidocaine 2 %; 2 mL bupivacaine 0.25 %; 40 mg methylPREDNISolone acetate 40 MG/ML Outcome: tolerated well, no immediate complications Patient was prepped and draped in the usual sterile fashion.       Clinical Data: No additional findings.   Subjective: Chief Complaint  Patient presents with   Left Shoulder - Pain    HPI patient is a pleasant 62-year-old female who comes in today with left shoulder pain for the past 3 weeks.  She denies any injury or change in activity.  The pain is to the posterior shoulder with some radiation into the deltoid.  Symptoms are constant but worse with shoulder abduction and internal rotation.  She has been taking chronic Percocet tens but has added Tylenol without improvement of symptoms.  She denies any paresthesias or weakness to the left upper  extremity.  She is status post left shoulder rotator cuff repair by Dr. Cleophas Dunker several years ago.  No issues until recently.  Review of Systems as detailed in HPI.  All others reviewed and are negative.   Objective: Vital Signs: There were no vitals taken for this visit.  Physical Exam well-developed and well-nourished female in no acute distress.  Alert and oriented x 3.  Ortho Exam left shoulder exam: Near full forward flexion.  90 degrees of abduction.  45 degrees of external rotation.  Internal rotation to T12.  Markedly positive empty can test with 4 out of 5 strength.  No pain with speeds or O'Brien's testing.  Negative bearhug.  Mild tenderness to the [facility] joint.  She is neurovascularly intact distally.  Specialty Comments:  MRI LUMBAR SPINE WITHOUT CONTRAST   TECHNIQUE: Multiplanar, multisequence MR imaging of the lumbar spine was performed. No intravenous contrast was administered.   COMPARISON:  Prior radiograph from a 02/02/2021 and MRI from 07/28/2017.   FINDINGS: Segmentation: Standard. Lowest well-formed disc space labeled the L5-S1 level.   Alignment: Exaggeration of the normal lumbar lordosis. Trace stepwise retrolisthesis of T12 on L1 and L1 on L2. Trace facet mediated anterolisthesis of L4 on L5.   Vertebrae: Vertebral body height maintained without acute or chronic fracture. Bone marrow signal intensity  heterogeneous but overall within normal limits. No discrete or worrisome osseous lesions. No abnormal marrow edema.   Conus medullaris and cauda equina: Conus extends to the L1-2 level. Conus and cauda equina appear normal.   Paraspinal and other soft tissues: Paraspinous soft tissues within normal limits. Few small benign appearing cyst noted about the visualized kidneys. Visualized visceral structures otherwise unremarkable.   Disc levels:   T11-12: Mild disc bulge with disc desiccation. Left greater than right facet hypertrophy. No significant  spinal stenosis. Mild left foraminal narrowing. Right neural foramen remains patent.   T12-L1: Mild disc bulge with disc desiccation. No spinal stenosis. Foramina remain patent.   L1-2: Intervertebral disc space narrowing with diffuse disc bulge and disc desiccation. Superimposed right foraminal to extraforaminal disc protrusion (series 6, image 19). No spinal stenosis. Mild right L1 foraminal narrowing. Left neural foramen remains patent.   L2-3: Negative interspace. Mild facet and ligament flavum hypertrophy. No canal or foraminal stenosis.   L3-4: Negative interspace. Mild to moderate facet and ligament flavum hypertrophy. No spinal stenosis. Mild left greater than right L3 foraminal narrowing.   L4-5: Trace anterolisthesis. Mild disc bulge with disc desiccation. Superimposed small right foraminal disc protrusion closely approximates and/or contacts the exiting right L4 nerve root (series 6, image 37). Advanced bilateral facet arthrosis with ligament flavum hypertrophy. Resultant mild canal with moderate bilateral subarticular stenosis. Moderate bilateral L4 foraminal narrowing, worse on the right.   L5-S1: Disc desiccation with diffuse disc bulge. Mild reactive endplate spurring. Moderate left worse than right facet hypertrophy. No significant spinal stenosis. Moderate left worse than right L5 foraminal stenosis.   IMPRESSION: 1. Multifactorial degenerative changes at L4-5 with resultant mild canal with moderate bilateral subarticular stenosis, with moderate bilateral L4 foraminal narrowing. Superimposed right foraminal disc protrusion at this level could potentially affect the exiting right L4 nerve root. 2. Disc bulging with facet hypertrophy at L5-S1 with resultant moderate left worse than right L5 foraminal stenosis. 3. Additional mild noncompressive disc bulging with facet hypertrophy at L1-2 and L2-3 without significant stenosis or neural impingement.      Electronically Signed   By: Rise Mu M.D.   On: 12/20/2021 03:21  Imaging: XR Shoulder Left  Result Date: 08/31/2023 Osteophyte formation to the [facility] joint.      PMFS History: Patient Active Problem List   Diagnosis Date Noted   Prediabetes 02/22/2023   Primary osteoarthritis of both hips 12/15/2022   Central adiposity 10/20/2022   Vitamin D deficiency 08/11/2022   Other fatigue 07/29/2022   SOBOE (shortness of breath on exertion) 07/29/2022   Osteoarthritis of left hip 07/29/2022   Other hyperlipidemia 07/29/2022   Depression screening 07/29/2022   Paresthesia 06/30/2022   Peripheral neuropathy 05/13/2022   Pain in left wrist 04/24/2022   Carpal tunnel syndrome, bilateral 03/18/2022   Plantar fasciitis of right foot 09/03/2021   Pes planus 09/03/2021   Nuclear sclerotic cataract of both eyes 05/27/2021   Diabetes mellitus without complication (HCC) 05/27/2021   Vitreomacular adhesion of both eyes 05/27/2021   Antiphospholipid antibody positive 05/21/2021   Pain in left elbow 02/19/2021   Chronic left-sided low back pain with left-sided sciatica 07/22/2017   Thyroid nodule 04/16/2017   Essential hypertension 03/13/2014   Menometrorrhagia    Dysmenorrhea    Anemia    Hyperlipidemia 05/23/2007   OBESITY, MORBID with starting BMI 45 05/23/2007   DYSFUNCTIONAL UTERINE BLEEDING - s/p LAVH 05/23/2007   ROTATOR CUFF SYNDROME 05/23/2007   KNEE PAIN, HX OF  05/23/2007   Past Medical History:  Diagnosis Date   Anemia    Anxiety    Arthritis    Back pain    Chest pain    Diabetes mellitus without complication (HCC)    recent dx diet controlled   DVT (deep venous thrombosis) (HCC)    remote - took coumadin   Dysmenorrhea    Dysrhythmia    irregular heart beat   Edema    Fatty liver    Fibroid    GERD (gastroesophageal reflux disease)    hx of   Gout    Hypercholesteremia    Hypertension 4 months   Hypothyroid    Joint pain    Menometrorrhagia     Morbid obesity (HCC)    Osteoarthritis    Palpitations    Prediabetes    Substance abuse (HCC)    Vitamin D deficiency     Family History  Problem Relation Age of Onset   Thyroid disease Mother    Hyperlipidemia Mother    Diabetes Mother    Hypertension Mother    Cancer Mother    Deep vein thrombosis Mother        and PE   Heart disease Mother    Kidney disease Mother    Diabetes Father    Hypertension Father    Cancer Father 27   Throat cancer Brother 35   Pulmonary embolism Daughter    Heart disease Other        No family history    Past Surgical History:  Procedure Laterality Date   ABDOMINAL HYSTERECTOMY  2008   BREAST BIOPSY Left 2013   benign   BUNIONECTOMY Bilateral    CHOLECYSTECTOMY     COLONOSCOPY WITH PROPOFOL N/A 10/27/2018   Procedure: COLONOSCOPY WITH PROPOFOL;  Surgeon: Graylin Shiver, MD;  Location: [facility];  Service: Endoscopy;  Laterality: N/A;   ECTOPIC PREGNANCY SURGERY  1980's   MANDIBLE OSTEOTOMY Bilateral 07/03/2013   Procedure: BILATERAL TORI;  Surgeon: Georgia Lopes, DDS;  Location: [facility] OR;  Service: Oral Surgery;  Laterality: Bilateral;   NM MYOCAR PERF WALL MOTION  07/2012   lexiscan - normal pattern of perfusion, low risk   ROTATOR CUFF REPAIR Bilateral 2009   SLEEP STUDY  07/27/2012   AHI 4.8/hr   TOOTH EXTRACTION N/A 07/03/2013   Procedure: DENTAL EXTRACTION # 20;  Surgeon: Georgia Lopes, DDS;  Location: [facility] OR;  Service: Oral Surgery;  Laterality: N/A;   TRANSTHORACIC ECHOCARDIOGRAM  07/2012   EF=>55%, mod conc LVH; LA mod dilated; trace MR; mild TR with normal RVSP; trace AV regurg   TUBAL LIGATION     Social History   Occupational History    Employer: UNEMPLOYED    Comment: Disability  Tobacco Use   Smoking status: Former    Current packs/day: 0.00    Average packs/day: 0.1 packs/day for 6.0 years (0.6 ttl pk-yrs)    Types: Cigarettes    Start date: 02/13/2005    Quit date: 02/14/2011    Years since quitting: 12.5    Smokeless tobacco: Never  Vaping Use   Vaping status: Never Used  Substance and Sexual Activity   Alcohol use: No    Comment: Previous ETOH abuse   Drug use: No    Comment: Previous crack use    Sexual activity: Never    Birth control/protection: Abstinence

## 2023-09-02 ENCOUNTER — Encounter (INDEPENDENT_AMBULATORY_CARE_PROVIDER_SITE_OTHER): Payer: Self-pay | Admitting: Family Medicine

## 2023-09-02 ENCOUNTER — Ambulatory Visit (INDEPENDENT_AMBULATORY_CARE_PROVIDER_SITE_OTHER): Payer: Medicaid Other | Admitting: Family Medicine

## 2023-09-02 VITALS — BP 112/77 | HR 71 | Temp 97.8°F | Ht 66.0 in | Wt 258.0 lb

## 2023-09-02 DIAGNOSIS — E1169 Type 2 diabetes mellitus with other specified complication: Secondary | ICD-10-CM

## 2023-09-02 DIAGNOSIS — I152 Hypertension secondary to endocrine disorders: Secondary | ICD-10-CM | POA: Diagnosis not present

## 2023-09-02 DIAGNOSIS — Z6841 Body Mass Index (BMI) 40.0 and over, adult: Secondary | ICD-10-CM

## 2023-09-02 DIAGNOSIS — E1159 Type 2 diabetes mellitus with other circulatory complications: Secondary | ICD-10-CM

## 2023-09-02 DIAGNOSIS — Z7985 Long-term (current) use of injectable non-insulin antidiabetic drugs: Secondary | ICD-10-CM

## 2023-09-02 DIAGNOSIS — E559 Vitamin D deficiency, unspecified: Secondary | ICD-10-CM

## 2023-09-02 MED ORDER — VITAMIN D (ERGOCALCIFEROL) 1.25 MG (50000 UNIT) PO CAPS: 50000.0000 [IU] | ORAL_CAPSULE | ORAL | 0 refills | Status: DC

## 2023-09-02 MED ORDER — SEMAGLUTIDE (1 MG/DOSE) 4 MG/3ML ~~LOC~~ SOPN
1.0000 mg | PEN_INJECTOR | SUBCUTANEOUS | 0 refills | Status: DC
Start: 2023-09-02 — End: 2023-09-29

## 2023-09-02 NOTE — Progress Notes (Signed)
Carlye Grippe, D.O.  ABFM, ABOM Specializing in Clinical Bariatric Medicine  Office located at: 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 Fat mass has decreased by 12.8 lb. Total body water has decreased by 7.6 lb.  Counseling done on how various foods will affect these numbers and how to maximize success  Total lbs lost to date: 22 lbs  Total weight loss percentage to date: 7.86%    Recommended Dietary Goals [name] is currently in the action stage of change. As such, her goal is to continue weight management plan.  She has agreed to: continue current plan   Behavioral Intervention We discussed the following today: high fiber foods, continue to work on maintaining a reduced calorie state, getting the recommended amount of protein, incorporating whole foods, making healthy choices, staying well hydrated and practicing mindfulness when eating.  Additional resources provided today:  Handout on Thanksgiving eating strategies   Evidence-based interventions for health behavior change were utilized today including the discussion of self monitoring techniques, problem-solving barriers and SMART goal setting techniques.   Regarding patient's less desirable eating habits and patterns, we employed the technique of small changes.   Pt will specifically work on: n/a   Recommended Physical Activity Goals [name] has been advised to work up to 150 minutes of moderate intensity aerobic activity a week and strengthening exercises 2-3 times per week for cardiovascular health, weight loss maintenance and preservation of muscle mass.   She has agreed to : Continue current level of physical activity    Pharmacotherapy We both agreed to : continue  current anti-obesity medication regimen   FOR ASSOCIATED CONDITIONS ADDRESSED TODAY:  Type 2 diabetes mellitus with obesity (HCC) Assessment & Plan: Most recent Hemoglobin A1c and fasting insulin:  Lab Results  Component Value Date   HGBA1C 5.3 07/22/2023   HGBA1C 5.6 07/29/2022   HGBA1C 6.4 (H) 06/19/2013   INSULIN 8.2 07/29/2022    T2DM treated with Ozempic 1 mg once a week. Pt tolerating medication well; she denies any N/V/D. She endorses that her blood sugars have been stable at home; she denies any highs/lows. Hunger and cravings are well controlled.   Pt will maintain with Ozempic at current dose. Continue with weight loss therapy via Category 3 nutritional plan. Recheck labs; future.   Orders: -     Semaglutide (1 MG/DOSE); Inject 1 mg as directed once a week.  Dispense: 3 mL; Refill: 0   Hypertension associated with type 2 diabetes mellitus (HCC) Assessment & Plan: Last 3 blood pressure readings in our office are as follows: BP Readings from Last 3 Encounters:  09/02/23 112/77  08/09/23 116/73  07/12/23 119/72   HTN treated with Toprol-XL, Lasix, and Lisinopril. Blood pressure is stable today; no concerns in this regard.   Pt was educated today that her blood pressure will continue to drop as she continues to lose weight. Recommended she check it 2-3 times a week. If BP is under 100/60 at home or if she develops any symptoms, I recommend she f/up with PCP to consider medication dose change. Our recommendation would be to drop the Metoprolol - as long as their is no prudent medical reason why she needs to continue this drug- since it can  be counterproductive to weight loss. I also recommend she speak with PCP about changing from an ACE Inhibitor to an ARB.    Vitamin D deficiency Assessment & Plan: Most recent vit D:  Lab Results  Component Value Date   VD25OH 82.9 07/29/2022   She is currently on ERGO 50,000 units once a week. She will continue with current regiment  and we will recheck labs today.   Orders: -     VITAMIN D 25 Hydroxy (Vit-D Deficiency, Fractures) -     Vitamin D (Ergocalciferol); Take 1 capsule (50,000 Units total) by mouth every 7 (seven) days.  Dispense: 4 capsule; Refill: 0   FOLLOW UP: Return 09/30/23. She was informed of the importance of frequent follow up visits to maximize her success with intensive lifestyle modifications for her multiple health conditions.  [name] is aware that we will review all of her lab results at our next visit.  She is aware that if anything is critical/ life threatening with the results, we will be contacting her via MyChart prior to the office visit to discuss management.    Subjective:   Chief complaint: Obesity [name] is here to discuss her progress with her obesity treatment plan. She is on the Category 3 Plan with B and L options and with 8-10 ounces of lean protein at dinner and states she is following her eating plan approximately 100% of the time. She states she is walking 20 minutes 4 days per week.  Interval History:  [name] is here for a follow up office visit. Since last OV, [name] is down 8 lbs. She endorses following her meal plan more diligently and eating 8-10 ounces of lean protein at dinner. She also has been drinking more water. She endorses feeling more satiated and is having less cravings.   Barriers identified: none  Pharmacotherapy for weight loss: She is currently taking  Ozempic 1 mg once a week .   Review of Systems:  Pertinent positives were addressed with patient today.  Reviewed by clinician on day of visit: allergies, medications, problem list, medical history, surgical history, family history, social history, and previous encounter notes.  Weight Summary and Biometrics   Weight Lost Since Last Visit: 8lb  Weight Gained Since Last Visit: 0lb   Vitals Temp: 97.8 F (36.6 C) BP: 112/77 Pulse Rate: 71 SpO2: 100  %   Anthropometric Measurements Height: 5\' 6"  (1.676 m) Weight: 258 lb (117 kg) BMI (Calculated): 41.66 Weight at Last Visit: 266lb Weight Lost Since Last Visit: 8lb Weight Gained Since Last Visit: 0lb Starting Weight: 280lb Total Weight Loss (lbs): 22 lb (9.979 kg) Peak Weight: 330lb   Body Composition  Body Fat %: 47.7 % Fat Mass (lbs): 123.4 lbs Muscle Mass (lbs): 128.4 lbs Total Body Water (lbs): 86.4 lbs Visceral Fat Rating : 16   Other Clinical Data Fasting: no Labs: yes Today's Visit #: 13 Starting Date: 08/08/22   Objective:   PHYSICAL EXAM: Blood pressure 112/77, pulse 71, temperature 97.8 F (36.6 C), height 5\' 6"  (1.676 m), weight 258 lb (117 kg), SpO2 100%. Body mass index is 41.64 kg/m.  General: she is overweight, cooperative and in no acute distress. PSYCH: Has normal mood, affect and thought process.   HEENT: EOMI, sclerae are anicteric. Lungs: Normal breathing effort, no conversational dyspnea. Extremities: Moves * 4 Neurologic: A and O * 3, good insight  DIAGNOSTIC DATA REVIEWED: BMET    Component Value Date/Time   NA 143 07/22/2023  0000   K 4.7 07/22/2023 0000   CL 106 07/22/2023 0000   CO2 32 (A) 07/22/2023 0000   GLUCOSE 111 (H) 06/03/2023 1030   BUN 11 07/22/2023 0000   CREATININE 0.8 07/22/2023 0000   CREATININE 1.00 06/03/2023 1030   CREATININE 1.00 12/25/2022 0758   CREATININE 1.06 (H) 04/18/2021 0905   CALCIUM 9.1 07/22/2023 0000   GFRNONAA >60 06/03/2023 1019   GFRNONAA >60 12/25/2022 0758   GFRNONAA 57 (L) 04/18/2021 0905   GFRAA 66 04/18/2021 0905   Lab Results  Component Value Date   HGBA1C 5.3 07/22/2023   HGBA1C 6.0 11/20/2009   Lab Results  Component Value Date   INSULIN 8.2 07/29/2022   Lab Results  Component Value Date   TSH 1.79 07/08/2023   CBC    Component Value Date/Time   WBC 5.0 07/22/2023 0000   WBC 5.5 06/03/2023 1019   RBC 4.05 07/22/2023 0000   HGB 11.8 (A) 07/22/2023 0000   HGB 12.8  12/25/2022 0758   HCT 36 07/22/2023 0000   PLT 294 07/22/2023 0000   PLT 268 12/25/2022 0758   MCV 87.6 06/03/2023 1019   MCH 29.5 06/03/2023 1019   MCHC 33.7 06/03/2023 1019   RDW 14.3 06/03/2023 1019   Iron Studies No results found for: "IRON", "TIBC", "FERRITIN", "IRONPCTSAT" Lipid Panel     Component Value Date/Time   CHOL 133 07/22/2023 0000   CHOL 129 07/29/2022 0930   TRIG 72 07/22/2023 0000   HDL 56 07/22/2023 0000   HDL 59 07/29/2022 0930   CHOLHDL 2.2 07/29/2022 0930   CHOLHDL 5.0 Ratio 10/07/2009 1913   VLDL 38 10/07/2009 1913   LDLCALC 63 07/22/2023 0000   LDLCALC 53 07/29/2022 0930   Hepatic Function Panel     Component Value Date/Time   PROT 7.7 06/03/2023 1019   ALBUMIN 4.0 07/22/2023 0000   AST 28 07/22/2023 0000   AST 22 12/25/2022 0758   ALT 58 (A) 07/22/2023 0000   ALT 24 12/25/2022 0758   ALKPHOS 58 07/22/2023 0000   BILITOT 0.5 06/03/2023 1019   BILITOT 0.5 12/25/2022 0758   BILIDIR <0.1 06/03/2023 1019   IBILI NOT CALCULATED 06/03/2023 1019      Component Value Date/Time   TSH 1.79 07/08/2023 0000   TSH 0.589 07/29/2022 0930   Nutritional Lab Results  Component Value Date   VD25OH 82.9 07/29/2022    Attestations:   I, Special Puri, acting as a Stage manager for [employer], DO., have compiled all relevant documentation for today's office visit on behalf of Thomasene Lot, DO, while in the presence of [employer], DO.  I have reviewed the above documentation for accuracy and completeness, and I agree with the above. Carlye Grippe, D.O.  The 21st Century Cures Act was signed into law in 2016 which includes the topic of electronic health records.  This provides immediate access to information in MyChart.  This includes consultation notes, operative notes, office notes, lab results and pathology reports.  If you have any questions about what you read please let us know at your next visit so we can discuss your concerns and take  corrective action if need be.  We are right here with you.

## 2023-09-03 LAB — VITAMIN D 25 HYDROXY (VIT D DEFICIENCY, FRACTURES): Vit D, 25-Hydroxy: 56.4 ng/mL (ref 30.0–100.0)

## 2023-09-29 ENCOUNTER — Ambulatory Visit (INDEPENDENT_AMBULATORY_CARE_PROVIDER_SITE_OTHER): Payer: Medicaid Other | Admitting: Physician Assistant

## 2023-09-29 ENCOUNTER — Encounter (INDEPENDENT_AMBULATORY_CARE_PROVIDER_SITE_OTHER): Payer: Self-pay | Admitting: Physician Assistant

## 2023-09-29 VITALS — BP 103/67 | HR 58 | Temp 98.0°F | Ht 66.0 in | Wt 258.0 lb

## 2023-09-29 DIAGNOSIS — K5900 Constipation, unspecified: Secondary | ICD-10-CM

## 2023-09-29 DIAGNOSIS — I152 Hypertension secondary to endocrine disorders: Secondary | ICD-10-CM

## 2023-09-29 DIAGNOSIS — E559 Vitamin D deficiency, unspecified: Secondary | ICD-10-CM

## 2023-09-29 DIAGNOSIS — E1159 Type 2 diabetes mellitus with other circulatory complications: Secondary | ICD-10-CM | POA: Diagnosis not present

## 2023-09-29 DIAGNOSIS — Z7985 Long-term (current) use of injectable non-insulin antidiabetic drugs: Secondary | ICD-10-CM

## 2023-09-29 DIAGNOSIS — E669 Obesity, unspecified: Secondary | ICD-10-CM

## 2023-09-29 DIAGNOSIS — E1169 Type 2 diabetes mellitus with other specified complication: Secondary | ICD-10-CM

## 2023-09-29 DIAGNOSIS — Z6841 Body Mass Index (BMI) 40.0 and over, adult: Secondary | ICD-10-CM

## 2023-09-29 DIAGNOSIS — K5903 Drug induced constipation: Secondary | ICD-10-CM

## 2023-09-29 MED ORDER — OZEMPIC (0.25 OR 0.5 MG/DOSE) 2 MG/1.5ML ~~LOC~~ SOPN: 0.5000 mg | PEN_INJECTOR | SUBCUTANEOUS | 0 refills | Status: DC

## 2023-09-29 NOTE — Progress Notes (Unsigned)
SUBJECTIVE:   Discussed the use of AI scribe software for clinical note transcription with the patient, who gave verbal consent to proceed.  History of Present Illness     Chief Complaint: Obesity  Interim History: [name]'s weight stayed the same since last visit.   [name], a 62 year old female with a history of obesity, type 2 diabetes, hypertension, and vitamin D deficiency, presents for a follow-up of her obesity treatment plan. She has been on Ozempic 1mg  weekly for her diabetes. Recently, her dose was increased to 1.0 mg, which has led to significant constipation, despite her attempts to manage it with fruit juice, Miralax, prunes, and stool softeners. She expresses discomfort with this side effect and wishes to return to her previous dose of   Ozempic 0.5mg  weekly.  Her blood sugar levels have been well-controlled, with the highest reading being 119 and the lowest being 73, with no associated symptoms. She has been adhering to a healthy diet, avoiding high-sugar foods and focusing on protein and vegetables. She also maintains an active lifestyle, walking to school to pick up her grandchildren and dancing, despite hip pain. She is scheduled for a hip replacement in the near future.  Her goal weight for the surgery is 245 pounds. She has been monitoring her protein intake and fluid intake, avoiding sodas and opting for sparkling water instead. She has an upcoming appointment with Dr. Sharee Holster in January.  [name] is here to discuss her progress with her obesity treatment plan. She is on the Category 3 Plan and states she is following her eating plan approximately 80 % of the time. She states she is exercising walking 30 minutes 3 times per week.   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 Satisfied with progress but desires more weight loss to reach 245 lbs for hip replacement surgery. Discussed holiday eating strategies and maintaining protein intake. Engaging in walking and dancing despite hip pain. - Continue current dietary and exercise regimen - Monitor weight and progress towards goal weight of 245 lbs  Type 2 Diabetes Mellitus Lab Results  Component Value Date   HGBA1C 5.3 07/22/2023   HGBA1C 5.6 07/29/2022   HGBA1C 6.4 (H) 06/19/2013   Lab Results  Component Value Date   LDLCALC 63 07/22/2023   CREATININE 0.8 07/22/2023    On Ozempic 1 mg weekly. Significant constipation after dose increase. Blood glucose levels between 73 and 119 mg/dL. No hypoglycemic symptoms. Prefers to reduce Ozempic dose due to discomfort and ineffective constipation management. - Reduce /refill Ozempic dose to 0.5 mg weekly - Monitor blood glucose levels - Evaluate response to lower dose in one month She is working  on nutrition plan to decrease simple carbohydrates, increase lean proteins and exercise to promote weight loss and improve glycemic control .  Constipation Likely secondary to increased Ozempic dose. Limited success with fruit juice, Miralax, prunes, and stool softeners. Prefers to reduce Ozempic dose to alleviate symptoms. - Reduce Ozempic dose to 0.5 mg weekly - Continue using Miralax  and prunes as needed - Consider long-term use of stool softeners if necessary  Hypertension Hypertension well controlled, stable, and no significant medication side effects noted.  Medication(s): lisinopril 5 mg daily   Metoprolol XL 25 mg daily Renal function normal range.    BP Readings from Last 3 Encounters:  09/29/23 103/67  09/02/23 112/77  08/09/23 116/73   Lab Results  Component Value  Date   CREATININE 0.8 07/22/2023   CREATININE 1.00 06/03/2023   CREATININE 0.61 06/03/2023   No results found for: "GFR"  Plan: Continue all antihypertensives at current dosages. Continue to work on nutrition plan to promote weight loss and improve BP control.  Monitor closely with patient on GLP-1 RA medication.   Vitamin D Deficiency Vitamin D Deficiency Vitamin D is at goal of 50.  Most recent vitamin D level was 56.4. She is on  prescription ergocalciferol 50,000 IU weekly. Lab Results  Component Value Date   VD25OH 56.4 09/02/2023   VD25OH 82.9 07/29/2022    Plan: Continue  prescription ergocalciferol 50,000 IU weekly  Adequate vitamin D supplements at home. Recent levels within normal range as of 11/21. - Continue current vitamin D supplementation - Monitor vitamin D levels as needed  General Health Maintenance Maintaining a healthy diet, staying hydrated, and engaging in physical activity. Avoiding sodas and opting for sparkling water. - Continue current dietary habits - Maintain adequate hydration - Encourage regular physical activity  Follow-up - Follow up with Dr. Molli Knock on January 16th at 7:40 AM.  Vitals Temp: 98 F (36.7 C) BP: 103/67 Pulse Rate: (!) 58 SpO2: 97 %   Anthropometric Measurements Height: 5\' 6"  (1.676 m) Weight: 258 lb (117 kg) BMI (Calculated): 41.66 Weight at Last Visit: 258 lb Weight Lost Since Last Visit: 0 Weight Gained Since Last Visit: 0 Starting Weight: 280 lb Total Weight Loss (lbs): 22 lb (9.979 kg) Peak Weight: 330 lb   Body Composition  Body Fat %: 48.8 % Fat Mass (lbs): 126.2 lbs Muscle Mass (lbs): 126 lbs Total Body Water (lbs): 88.8 lbs Visceral Fat Rating : 16   Other Clinical Data Fasting: no Labs: no Today's Visit #: 14 Starting Date: 08/08/22     ASSESSMENT AND PLAN:  Diet: [name] is currently in the action stage of change. As such, her goal is to continue with weight loss efforts. She has  agreed to Category 3 Plan.  Exercise: [name] has been instructed  continue exercise as able   for weight loss and overall health benefits.   Behavior Modification:  We discussed the following Behavioral Modification Strategies today: increasing lean protein intake, decreasing simple carbohydrates, increasing vegetables, increase H2O intake, increase high fiber foods, no skipping meals, meal planning and cooking strategies, holiday eating strategies, avoiding temptations, and planning for success. We discussed various medication options to help [name] with her weight loss efforts and we both agreed to decrease Ozempic to 0.5 mg weekly for Type 2 diabetes management.  No follow-ups on file.Marland Kitchen She was informed of the importance of frequent follow up visits to maximize her success with intensive lifestyle modifications for her multiple health conditions.  Attestation Statements:   Reviewed by clinician on day of visit: allergies, medications, problem list, medical history, surgical history, family history, social history, and previous encounter notes.   Time spent on visit including pre-visit chart review and post-visit care and charting was 35 minutes.    Kajah Santizo, PA-C

## 2023-09-30 ENCOUNTER — Ambulatory Visit (INDEPENDENT_AMBULATORY_CARE_PROVIDER_SITE_OTHER): Payer: Medicaid Other | Admitting: Family Medicine

## 2023-10-28 ENCOUNTER — Encounter (INDEPENDENT_AMBULATORY_CARE_PROVIDER_SITE_OTHER): Payer: Self-pay | Admitting: Family Medicine

## 2023-10-28 ENCOUNTER — Ambulatory Visit (INDEPENDENT_AMBULATORY_CARE_PROVIDER_SITE_OTHER): Payer: Medicaid Other | Admitting: Family Medicine

## 2023-10-28 VITALS — BP 116/71 | HR 67 | Temp 97.6°F | Ht 66.0 in | Wt 255.0 lb

## 2023-10-28 DIAGNOSIS — E782 Mixed hyperlipidemia: Secondary | ICD-10-CM | POA: Diagnosis not present

## 2023-10-28 DIAGNOSIS — I152 Hypertension secondary to endocrine disorders: Secondary | ICD-10-CM

## 2023-10-28 DIAGNOSIS — E1169 Type 2 diabetes mellitus with other specified complication: Secondary | ICD-10-CM | POA: Diagnosis not present

## 2023-10-28 DIAGNOSIS — E1159 Type 2 diabetes mellitus with other circulatory complications: Secondary | ICD-10-CM | POA: Diagnosis not present

## 2023-10-28 DIAGNOSIS — E669 Obesity, unspecified: Secondary | ICD-10-CM

## 2023-10-28 DIAGNOSIS — Z6841 Body Mass Index (BMI) 40.0 and over, adult: Secondary | ICD-10-CM

## 2023-10-28 DIAGNOSIS — Z7985 Long-term (current) use of injectable non-insulin antidiabetic drugs: Secondary | ICD-10-CM

## 2023-10-28 DIAGNOSIS — E559 Vitamin D deficiency, unspecified: Secondary | ICD-10-CM

## 2023-10-28 MED ORDER — VITAMIN D (ERGOCALCIFEROL) 1.25 MG (50000 UNIT) PO CAPS: 50000.0000 [IU] | ORAL_CAPSULE | ORAL | 0 refills | Status: DC

## 2023-10-28 MED ORDER — OZEMPIC (0.25 OR 0.5 MG/DOSE) 2 MG/1.5ML ~~LOC~~ SOPN: 0.5000 mg | PEN_INJECTOR | SUBCUTANEOUS | 0 refills | Status: DC

## 2023-10-28 NOTE — Progress Notes (Signed)
[name], D.O.  ABFM, ABOM Specializing in Clinical Bariatric Medicine  Office located at: [address] [place], [place]  [address]   Assessment and Plan:   Labs obtained today (A1C, CMP, and CBC) will be reviewed at next OV.   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 Fat mass has decreased by 5.4lb. Total body water has decreased by 2.8lb.  Counseling done on how various foods will affect these numbers and how to maximize success  Total lbs lost to date: 25% Total weight loss percentage to date: -8.93%   Recommended Dietary Goals [name] is currently in the action stage of change. As such, her goal is to continue weight management plan.  She has agreed to: continue current plan   Behavioral Intervention We discussed the following today: increasing lean protein intake to established goals, decreasing simple carbohydrates , avoiding skipping meals, increasing water intake , keeping healthy foods at home, decreasing eating out or consumption of processed foods, and making healthy choices when eating convenient foods, avoiding temptations and identifying enticing environmental cues, continue to practice mindfulness when eating, and continue to work on maintaining a reduced calorie state, getting the recommended amount of protein, incorporating whole foods, making healthy choices, staying well hydrated and practicing mindfulness when eating.  Additional resources provided today: None  Evidence-based interventions for health behavior change were utilized today including the discussion of self monitoring techniques, problem-solving barriers and SMART goal setting techniques.   Regarding patient's less desirable eating habits and patterns, we employed the technique of small  changes.   Pt will specifically work on: n/a for next visit.    Recommended Physical Activity Goals [name] has been advised to work up to 150 minutes of moderate intensity aerobic activity a week and strengthening exercises 2-3 times per week for cardiovascular health, weight loss maintenance and preservation of muscle mass.   She has agreed to :  Continue current level of physical activity    Pharmacotherapy We discussed various medication options to help [name] with her weight loss efforts and we both agreed to:  continue with nutritional and behavioral strategies and continue current anti-obesity medication regimen   FOR ASSOCIATED CONDITIONS ADDRESSED TODAY:  Type 2 diabetes mellitus with obesity [facility]) Assessment & Plan: Lab Results  Component Value Date   HGBA1C 5.3 07/22/2023   HGBA1C 5.6 07/29/2022   HGBA1C 6.4 (H) 06/19/2013   INSULIN 8.2 07/29/2022    A1C was at goal as of 10/10/224. Per Tonya Arms, PA's note on 09/29/23 pt reported constipation with no relief from OTC meds and home remedies while taking 1 mg Ozempic. Pt requested to return to the 0.5 mg dose at the time and has since been compliant with the 0.5 mg once weekly dose. Pt has hx of fatty liver and is established with a hepatologist, which she will soon follow up with. She is drinking water throughout the day and feels properly hydrated.   Continue on 0.5 mg once weekly dose of Ozempic. Encouraged pt to stay properly hydrated, especially while exercising. Drinking at least 80-100 ounces of water per day. Continue to exercise 40 minutes for 5 days per week. Encouraged pt to eat prior to going out for social events/gatherings. Labs obtained today will be reviewed at her next follow up.  Orders: - Refill Ozempic today, no dose changes.  - Recheck Hgb A1C today   Hypertension associated with type 2 diabetes mellitus [facility]) Assessment & Plan: BP Readings from Last 3 Encounters:  10/28/23 116/71   09/29/23 103/67  09/02/23 112/77   BP at goal today. Treating HTN with Toprol-xl 25 mg once daily, lisinopril 5 mg once daily, and lasix 20 mg once daily. Pt is compliant with Pt has increased exercise, walking more. Eating more fish (salmon and tilapia). Has been drinking more water.   Educated pt that increased protein intake will increase muscle mass which will speed metabolism. Reviewed affordable options of high protein and low calorie fish (frozen white meat fish and canned tuna). Advised pt to limit eating salmon to once a week given it is high calorie. Reviewed healthier alternatives for oils (olive, avocado, and grape seed oils).   Orders: - Recheck CBC - Recheck CMP today.    Mixed diabetic hyperlipidemia associated with type 2 diabetes mellitus [facility]) Assessment & Plan: Lab Results  Component Value Date   CHOL 133 07/22/2023   HDL 56 07/22/2023   LDLCALC 63 07/22/2023   TRIG 72 07/22/2023   CHOLHDL 2.2 07/29/2022   Pt treating with atorvastatin 40 mg once daily. Components on latest lipid panel are within normal ranges.   Encouraged pt to continue exercising regularly. Avoid eating processed or fried foods. Cut back on simple carbs/sugars. Continue with statin therapy as directed by PCP/specialists. No changes made today.    Vitamin D deficiency Assessment & Plan: Lab Results  Component Value Date   VD25OH 56.4 09/02/2023   VD25OH 82.9 07/29/2022   Last vitamin D was at goal as of 09/02/23. Condition treated with ERGO 50K units once weekly. Tolerating well, no adverse side effects reported today. No concerns today. Continue with current vitamin D supplementation. No changes made.   Orders: - Refill ERGO today   Follow up:   Return in about 4 weeks (around 11/25/2023). She was informed of the importance of frequent follow up visits to maximize her success with intensive lifestyle modifications for her multiple health conditions.  Subjective:   Chief complaint:  Obesity [name] is here to discuss her progress with her obesity treatment plan. She is on the the Category 3 Plan and states she is following her eating plan approximately 90% of the time. She states she is walking 40 minutes 5 days per week.  Interval History:  [name] is here for a follow up office visit. Since last OV, she is down 3 lbs. Is eating more fish (salmon and tilapia). Is walking more than she used to. To avoid eating off-plan foods when going out with family/friends she tends to eat before leaving her home.   Pharmacotherapy for weight loss: She is currently taking Ozempic with diabetes as the primary indication with adequate clinical response  and without side effects..   Review of Systems:  Pertinent positives were addressed with patient today.  Reviewed by clinician on day of visit: allergies, medications, problem list, medical history, surgical history, family history, social history, and previous encounter notes.  Weight Summary and Biometrics   Weight Lost Since Last Visit: 3 lb  Weight Gained Since Last Visit: 0    Vitals Temp: 97.6 F (36.4 C) BP: 116/71 Pulse Rate: 67 SpO2: 96 %   Anthropometric Measurements Height: 5\' 6"  (1.676 m) Weight: 255 lb (115.7 kg) BMI (Calculated): 41.18 Weight at Last Visit: 258 lb Weight Lost Since Last Visit: 3  lb Weight Gained Since Last Visit: 0 Starting Weight: 280 lb Total Weight Loss (lbs): 25 lb (11.3 kg) Peak Weight: 330 lb   Body Composition  Body Fat %: 47.4 % Fat Mass (lbs): 120.8 lbs Muscle Mass (lbs): 127.4 lbs Total Body Water (lbs): 56 lbs Visceral Fat Rating : 16   Other Clinical Data Fasting: Yes Today's Visit #: 15 Starting Date: 08/08/22    Objective:   PHYSICAL EXAM: Blood pressure 116/71, pulse 67, temperature 97.6 F (36.4 C), height 5\' 6"  (1.676 m), weight 255 lb (115.7 kg), SpO2 96%. Body mass index is 41.16 kg/m.  General: she is overweight, cooperative and in no  acute distress. PSYCH: Has normal mood, affect and thought process.   HEENT: EOMI, sclerae are anicteric. Lungs: Normal breathing effort, no conversational dyspnea. Extremities: Moves * 4 Neurologic: A and O * 3, good insight  DIAGNOSTIC DATA REVIEWED: BMET    Component Value Date/Time   NA 143 07/22/2023 0000   K 4.7 07/22/2023 0000   CL 106 07/22/2023 0000   CO2 32 (A) 07/22/2023 0000   GLUCOSE 111 (H) 06/03/2023 1030   BUN 11 07/22/2023 0000   CREATININE 0.8 07/22/2023 0000   CREATININE 1.00 06/03/2023 1030   CREATININE 1.00 12/25/2022 0758   CREATININE 1.06 (H) 04/18/2021 0905   CALCIUM 9.1 07/22/2023 0000   GFRNONAA >60 06/03/2023 1019   GFRNONAA >60 12/25/2022 0758   GFRNONAA 57 (L) 04/18/2021 0905   GFRAA 66 04/18/2021 0905   Lab Results  Component Value Date   HGBA1C 5.3 07/22/2023   HGBA1C 6.0 11/20/2009   Lab Results  Component Value Date   INSULIN 8.2 07/29/2022   Lab Results  Component Value Date   TSH 1.79 07/08/2023   CBC    Component Value Date/Time   WBC 5.0 07/22/2023 0000   WBC 5.5 06/03/2023 1019   RBC 4.05 07/22/2023 0000   HGB 11.8 (A) 07/22/2023 0000   HGB 12.8 12/25/2022 0758   HCT 36 07/22/2023 0000   PLT 294 07/22/2023 0000   PLT 268 12/25/2022 0758   MCV 87.6 06/03/2023 1019   MCH 29.5 06/03/2023 1019   MCHC 33.7 06/03/2023 1019   RDW 14.3 06/03/2023 1019   Iron Studies No results found for: "IRON", "TIBC", "FERRITIN", "IRONPCTSAT" Lipid Panel     Component Value Date/Time   CHOL 133 07/22/2023 0000   CHOL 129 07/29/2022 0930   TRIG 72 07/22/2023 0000   HDL 56 07/22/2023 0000   HDL 59 07/29/2022 0930   CHOLHDL 2.2 07/29/2022 0930   CHOLHDL 5.0 Ratio 10/07/2009 1913   VLDL 38 10/07/2009 1913   LDLCALC 63 07/22/2023 0000   LDLCALC 53 07/29/2022 0930   Hepatic Function Panel     Component Value Date/Time   PROT 7.7 06/03/2023 1019   ALBUMIN 4.0 07/22/2023 0000   AST 28 07/22/2023 0000   AST 22 12/25/2022 0758   ALT  58 (A) 07/22/2023 0000   ALT 24 12/25/2022 0758   ALKPHOS 58 07/22/2023 0000   BILITOT 0.5 06/03/2023 1019   BILITOT 0.5 12/25/2022 0758   BILIDIR <0.1 06/03/2023 1019   IBILI NOT CALCULATED 06/03/2023 1019      Component Value Date/Time   TSH 1.79 07/08/2023 0000   TSH 0.589 07/29/2022 0930   Nutritional Lab Results  Component Value Date   VD25OH 56.4 09/02/2023   VD25OH 82.9 07/29/2022    Attestations:   I, Isabelle Course, acting as a Stage manager for [employer],  DO., have compiled all relevant documentation for today's office visit on behalf of Thomasene Lot, DO, while in the presence of Thomasene Lot, DO.  Reviewed by clinician on day of visit: allergies, medications, problem list, medical history, surgical history, family history, social history, and previous encounter notes pertinent to patient's obesity diagnosis.  I have reviewed the above documentation for accuracy and completeness, and I agree with the above. [name], D.O.  The 21st Century Cures Act was signed into law in 2016 which includes the topic of electronic health records.  This provides immediate access to information in MyChart.  This includes consultation notes, operative notes, office notes, lab results and pathology reports.  If you have any questions about what you read please let us know at your next visit so we can discuss your concerns and take corrective action if need be.  We are right here with you.

## 2023-10-29 LAB — COMPREHENSIVE METABOLIC PANEL
ALT: 46 [IU]/L — ABNORMAL HIGH (ref 0–32)
AST: 28 [IU]/L (ref 0–40)
Albumin: 4.3 g/dL (ref 3.9–4.9)
Alkaline Phosphatase: 79 [IU]/L (ref 44–121)
BUN/Creatinine Ratio: 24 (ref 12–28)
BUN: 22 mg/dL (ref 8–27)
Bilirubin Total: 0.3 mg/dL (ref 0.0–1.2)
CO2: 25 mmol/L (ref 20–29)
Calcium: 9.4 mg/dL (ref 8.7–10.3)
Chloride: 105 mmol/L (ref 96–106)
Creatinine, Ser: 0.92 mg/dL (ref 0.57–1.00)
Globulin, Total: 2.9 g/dL (ref 1.5–4.5)
Glucose: 92 mg/dL (ref 70–99)
Potassium: 4.4 mmol/L (ref 3.5–5.2)
Sodium: 142 mmol/L (ref 134–144)
Total Protein: 7.2 g/dL (ref 6.0–8.5)
eGFR: 70 mL/min/{1.73_m2} (ref >59)

## 2023-10-29 LAB — CBC WITH DIFFERENTIAL/PLATELET
Basophils Absolute: 0 10*3/uL (ref 0.0–0.2)
Basos: 1 %
EOS (ABSOLUTE): 0.2 10*3/uL (ref 0.0–0.4)
Eos: 3 %
Hematocrit: 41.2 % (ref 34.0–46.6)
Hemoglobin: 12.8 g/dL (ref 11.1–15.9)
Immature Grans (Abs): 0 10*3/uL (ref 0.0–0.1)
Immature Granulocytes: 0 %
Lymphocytes Absolute: 2.1 10*3/uL (ref 0.7–3.1)
Lymphs: 38 %
MCH: 28.2 pg (ref 26.6–33.0)
MCHC: 31.1 g/dL — ABNORMAL LOW (ref 31.5–35.7)
MCV: 91 fL (ref 79–97)
Monocytes Absolute: 0.4 10*3/uL (ref 0.1–0.9)
Monocytes: 8 %
Neutrophils Absolute: 2.8 10*3/uL (ref 1.4–7.0)
Neutrophils: 50 %
Platelets: 272 10*3/uL (ref 150–450)
RBC: 4.54 x10E6/uL (ref 3.77–5.28)
RDW: 14.6 % (ref 11.7–15.4)
WBC: 5.5 10*3/uL (ref 3.4–10.8)

## 2023-10-29 LAB — HEMOGLOBIN A1C
Est. average glucose Bld gHb Est-mCnc: 108 mg/dL
Hgb A1c MFr Bld: 5.4 % (ref 4.8–5.6)

## 2023-11-19 ENCOUNTER — Telehealth: Payer: Self-pay | Admitting: Physician Assistant

## 2023-11-19 NOTE — Telephone Encounter (Signed)
 Rescheduled appointments per provider on PAL. Patient is aware of the changes made and is active on MyChart.

## 2023-11-25 ENCOUNTER — Ambulatory Visit (INDEPENDENT_AMBULATORY_CARE_PROVIDER_SITE_OTHER): Payer: Medicaid Other | Admitting: Family Medicine

## 2023-11-25 ENCOUNTER — Encounter (INDEPENDENT_AMBULATORY_CARE_PROVIDER_SITE_OTHER): Payer: Self-pay | Admitting: Family Medicine

## 2023-11-25 VITALS — BP 112/72 | HR 66 | Temp 98.0°F | Ht 66.0 in | Wt 259.0 lb

## 2023-11-25 DIAGNOSIS — E1159 Type 2 diabetes mellitus with other circulatory complications: Secondary | ICD-10-CM | POA: Diagnosis not present

## 2023-11-25 DIAGNOSIS — Z6841 Body Mass Index (BMI) 40.0 and over, adult: Secondary | ICD-10-CM

## 2023-11-25 DIAGNOSIS — R7401 Elevation of levels of liver transaminase levels: Secondary | ICD-10-CM

## 2023-11-25 DIAGNOSIS — E1169 Type 2 diabetes mellitus with other specified complication: Secondary | ICD-10-CM

## 2023-11-25 DIAGNOSIS — Z7985 Long-term (current) use of injectable non-insulin antidiabetic drugs: Secondary | ICD-10-CM

## 2023-11-25 DIAGNOSIS — I152 Hypertension secondary to endocrine disorders: Secondary | ICD-10-CM | POA: Diagnosis not present

## 2023-11-25 NOTE — Progress Notes (Signed)
[name], D.O.  ABFM, ABOM Specializing in Clinical Bariatric Medicine  Office located at: 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 Fat mass has increased by 4.4 lb. Total body water has increased by 0.6 lb.  Counseling done on how various foods will affect these numbers and how to maximize success  Total lbs lost to date: 21 lbs  Total weight loss percentage to date: 7.50%    Recommended Dietary Goals [name] is currently in the action stage of change. As such, her goal is to continue weight management plan.  She has agreed to: switch to Category 2 MP with B/L options and 6 ounces of lean protein at lunch and 8-10 ounces at dinner . This switch was made based on her RMR of 2074 on 07/29/2022.    Behavioral Intervention We discussed the following today: increasing lean protein intake to established goals,  decreasing simple carbohydrates, measuring snack calories, various protein shake options, [employer] Protein chips, and CHAT-GPT for recipe ideas.   Additional resources provided today: handout on CAT 2 meal plan, handout on B/L options, handout on protein equivalents of 2 ounces of meat or seafood, handout on low carb vegetables, handout on CHAT-GPT inspired recipes.   Evidence-based interventions for health behavior change were utilized today including the discussion of self monitoring techniques, problem-solving barriers and SMART goal setting techniques.   Regarding patient's less desirable eating habits and patterns, we employed the technique of small changes.   Pt will specifically work on: calculating/being mindful of snack calories   Recommended Physical Activity Goals [name] has been advised to work up to 150 minutes of  moderate intensity aerobic activity a week and strengthening exercises 2-3 times per week for cardiovascular health, weight loss maintenance and preservation of muscle mass.   She has agreed to : Continue current level of physical activity    Pharmacotherapy We both agreed to : continue with nutritional and behavioral strategies and continue Ozempic at current dose.    FOR ASSOCIATED CONDITIONS ADDRESSED TODAY:  Type 2 diabetes mellitus with obesity (HCC) Assessment & Plan: Lab Results  Component Value Date   HGBA1C 5.4 10/28/2023    T2DM well controlled on Ozempic 0.5 mg weekly - most recent Hemglobin A1c of 5.4. She is tolerating the GLP-1 medication well - denies SE. Her hunger/cravings are stable. Reminded patient that having adequate amounts of protein with each meal is important for increasing muscle mass, stabilizing sugars, controlling hunger and cravings, and improving thermogenesis. Switch to CAT 2 MP and continue Ozempic - she desires to maintain at current dose.   Hypertension associated with type 2 diabetes mellitus [facility]) Assessment & Plan: Last 3 blood pressure readings in our office are as follows: BP Readings from Last 3 Encounters:  11/25/23 112/72  10/28/23 116/71  09/29/23 103/67   Lab Results  Component Value Date   WBC 5.5 10/28/2023   HGB 12.8 10/28/2023   HCT 41.2 10/28/2023   MCV 91 10/28/2023   PLT 272 10/28/2023   Lab Results  Component Value Date   CREATININE 0.92 10/28/2023   BUN 22 10/28/2023   NA 142 10/28/2023   K 4.4 10/28/2023   CL 105 10/28/2023   CO2 25 10/28/2023   BP well  controlled on Toprol-XL, Lisinopril, and Lasix. Blood counts and renal panel are essentially WNL. Pt reminded about maintaining adequate hydration - roughly 128 ounces of water daily. Continue all medications and low sodium diet - advance exercise as able.    Elevated ALT measurement Assessment & Plan:    Component Value Date/Time   PROT 7.2 10/28/2023 0813    ALBUMIN 4.3 10/28/2023 0813   AST 28 10/28/2023 0813   AST 22 12/25/2022 0758   ALT 46 (H) 10/28/2023 0813   ALT 24 12/25/2022 0758   ALKPHOS 79 10/28/2023 0813   BILITOT 0.3 10/28/2023 0813   BILITOT 0.5 12/25/2022 0758   BILIDIR <0.1 06/03/2023 1019   IBILI NOT CALCULATED 06/03/2023 1019    Most recent imaging of abdomen was done on 02/02/2021 - no fatty liver was noted, however it showed hepatomegaly. ALT has improved from 58 to 46. Continue treatment which includes: 7-10% reduction of body weight, elimination of sweet drinks, including juice, avoidance of high fructose corn syrup, and exercise. As always, avoiding alcohol consumption is important.   Follow up:   Return 12/23/2023. She was informed of the importance of frequent follow up visits to maximize her success with intensive lifestyle modifications for her multiple health conditions.  Subjective:   Chief complaint: Obesity [name] is here to discuss her progress with her obesity treatment plan. She is on the Category 3 Plan and states she is following her eating plan approximately 95% of the time. She states she is walking 30 minutes 5 days per week.  Interval History:  [name] is here for a follow up office visit. Since last OV on 10/28/2023, [name] is up 4 lbs. She endorses measuring and eating lean proteins like skinless chicken breast, tilapia fish, canned tuna in water, etc. Purchased an air fryer recently. Upon food recall she appears to sometimes be eating excess calories and "off-plan foods" when her grand children are visiting. These foods include chips, donuts, and ice cream.  Pharmacotherapy for weight loss: She is currently taking  Ozempic 0.5 mg weekly .   Review of Systems:  Pertinent positives were addressed with patient today.  Reviewed by clinician on day of visit: allergies, medications, problem list, medical history, surgical history, family history, social history, and previous encounter  notes.  Weight Summary and Biometrics   Weight Lost Since Last Visit: 0lb  Weight Gained Since Last Visit: 4lb   Vitals Temp: 98 F (36.7 C) BP: 112/72 Pulse Rate: 66 SpO2: 98 %   Anthropometric Measurements Height: 5\' 6"  (1.676 m) Weight: 259 lb (117.5 kg) BMI (Calculated): 41.82 Weight at Last Visit: 255lb Weight Lost Since Last Visit: 0lb Weight Gained Since Last Visit: 4lb Starting Weight: 280lb Total Weight Loss (lbs): 21 lb (9.526 kg) Peak Weight: 330lb   Body Composition  Body Fat %: 48.3 % Fat Mass (lbs): 125.2 lbs Muscle Mass (lbs): 127.2 lbs Total Body Water (lbs): 86.6 lbs Visceral Fat Rating : 16   Other Clinical Data Fasting: No Labs: No Today's Visit #: 16 Starting Date: 08/08/22   Objective:   PHYSICAL EXAM: Blood pressure 112/72, pulse 66, temperature 98 F (36.7 C), height 5\' 6"  (1.676 m), weight 259 lb (117.5 kg), SpO2 98%. Body mass index is 41.8 kg/m.  General: she is overweight, cooperative and in no acute distress. PSYCH: Has normal mood, affect and thought process.   HEENT: EOMI, sclerae are anicteric. Lungs: Normal breathing effort, no conversational dyspnea. Extremities: Moves * 4 Neurologic: A and O *  3, good insight  DIAGNOSTIC DATA REVIEWED: BMET    Component Value Date/Time   NA 142 10/28/2023 0813   K 4.4 10/28/2023 0813   CL 105 10/28/2023 0813   CO2 25 10/28/2023 0813   GLUCOSE 92 10/28/2023 0813   GLUCOSE 111 (H) 06/03/2023 1030   BUN 22 10/28/2023 0813   CREATININE 0.92 10/28/2023 0813   CREATININE 1.00 12/25/2022 0758   CREATININE 1.06 (H) 04/18/2021 0905   CALCIUM 9.4 10/28/2023 0813   GFRNONAA >60 06/03/2023 1019   GFRNONAA >60 12/25/2022 0758   GFRNONAA 57 (L) 04/18/2021 0905   GFRAA 66 04/18/2021 0905   Lab Results  Component Value Date   HGBA1C 5.4 10/28/2023   HGBA1C 6.0 11/20/2009   Lab Results  Component Value Date   INSULIN 8.2 07/29/2022   Lab Results  Component Value Date   TSH 1.79  07/08/2023   CBC    Component Value Date/Time   WBC 5.5 10/28/2023 0813   WBC 5.5 06/03/2023 1019   RBC 4.54 10/28/2023 0813   RBC 4.05 07/22/2023 0000   HGB 12.8 10/28/2023 0813   HCT 41.2 10/28/2023 0813   PLT 272 10/28/2023 0813   MCV 91 10/28/2023 0813   MCH 28.2 10/28/2023 0813   MCH 29.5 06/03/2023 1019   MCHC 31.1 (L) 10/28/2023 0813   MCHC 33.7 06/03/2023 1019   RDW 14.6 10/28/2023 0813   Iron Studies No results found for: "IRON", "TIBC", "FERRITIN", "IRONPCTSAT" Lipid Panel     Component Value Date/Time   CHOL 133 07/22/2023 0000   CHOL 129 07/29/2022 0930   TRIG 72 07/22/2023 0000   HDL 56 07/22/2023 0000   HDL 59 07/29/2022 0930   CHOLHDL 2.2 07/29/2022 0930   CHOLHDL 5.0 Ratio 10/07/2009 1913   VLDL 38 10/07/2009 1913   LDLCALC 63 07/22/2023 0000   LDLCALC 53 07/29/2022 0930   Hepatic Function Panel     Component Value Date/Time   PROT 7.2 10/28/2023 0813   ALBUMIN 4.3 10/28/2023 0813   AST 28 10/28/2023 0813   AST 22 12/25/2022 0758   ALT 46 (H) 10/28/2023 0813   ALT 24 12/25/2022 0758   ALKPHOS 79 10/28/2023 0813   BILITOT 0.3 10/28/2023 0813   BILITOT 0.5 12/25/2022 0758   BILIDIR <0.1 06/03/2023 1019   IBILI NOT CALCULATED 06/03/2023 1019      Component Value Date/Time   TSH 1.79 07/08/2023 0000   TSH 0.589 07/29/2022 0930   Nutritional Lab Results  Component Value Date   VD25OH 56.4 09/02/2023   VD25OH 82.9 07/29/2022    Attestations:   I, [name], acting as a Stage manager for [employer], DO., have compiled all relevant documentation for today's office visit on behalf of Tonya Lot, DO, while in the presence of [employer], DO.  Reviewed by clinician on day of visit: allergies, medications, problem list, medical history, surgical history, family history, social history, and previous encounter notes pertinent to patient's obesity diagnosis. I have spent 41 minutes in the care of the patient today  including: preparing to see patient (e.g. review and interpretation of tests, old notes ), obtaining and/or reviewing separately obtained history, performing a medically appropriate examination or evaluation, counseling and educating the patient, ordering medications, test or procedures, documenting clinical information in the electronic or other health care record, and independently interpreting results and communicating results to the patient, family, or caregiver   I have reviewed the above documentation for accuracy and completeness, and I agree  with the above. [name], D.O.  The 21st Century Cures Act was signed into law in 2016 which includes the topic of electronic health records.  This provides immediate access to information in MyChart.  This includes consultation notes, operative notes, office notes, lab results and pathology reports.  If you have any questions about what you read please let us know at your next visit so we can discuss your concerns and take corrective action if need be.  We are right here with you.

## 2023-12-07 ENCOUNTER — Other Ambulatory Visit: Payer: Self-pay | Admitting: Physician Assistant

## 2023-12-07 DIAGNOSIS — R760 Raised antibody titer: Secondary | ICD-10-CM

## 2023-12-08 ENCOUNTER — Inpatient Hospital Stay: Payer: Medicaid Other | Attending: Physician Assistant

## 2023-12-08 ENCOUNTER — Inpatient Hospital Stay (HOSPITAL_BASED_OUTPATIENT_CLINIC_OR_DEPARTMENT_OTHER): Payer: Medicaid Other | Admitting: Physician Assistant

## 2023-12-08 VITALS — BP 106/73 | HR 57 | Temp 97.6°F | Resp 16 | Ht 66.0 in | Wt 262.3 lb

## 2023-12-08 DIAGNOSIS — Z7901 Long term (current) use of anticoagulants: Secondary | ICD-10-CM | POA: Insufficient documentation

## 2023-12-08 DIAGNOSIS — R760 Raised antibody titer: Secondary | ICD-10-CM

## 2023-12-08 DIAGNOSIS — Z87891 Personal history of nicotine dependence: Secondary | ICD-10-CM | POA: Diagnosis not present

## 2023-12-08 DIAGNOSIS — Z86718 Personal history of other venous thrombosis and embolism: Secondary | ICD-10-CM | POA: Insufficient documentation

## 2023-12-08 DIAGNOSIS — D6861 Antiphospholipid syndrome: Secondary | ICD-10-CM | POA: Diagnosis present

## 2023-12-08 LAB — CBC WITH DIFFERENTIAL (CANCER CENTER ONLY)
Abs Immature Granulocytes: 0.02 10*3/uL (ref 0.00–0.07)
Basophils Absolute: 0.1 10*3/uL (ref 0.0–0.1)
Basophils Relative: 1 %
Eosinophils Absolute: 0.2 10*3/uL (ref 0.0–0.5)
Eosinophils Relative: 3 %
HCT: 37.2 % (ref 36.0–46.0)
Hemoglobin: 12.8 g/dL (ref 12.0–15.0)
Immature Granulocytes: 0 %
Lymphocytes Relative: 34 %
Lymphs Abs: 2.1 10*3/uL (ref 0.7–4.0)
MCH: 29.8 pg (ref 26.0–34.0)
MCHC: 34.4 g/dL (ref 30.0–36.0)
MCV: 86.7 fL (ref 80.0–100.0)
Monocytes Absolute: 0.6 10*3/uL (ref 0.1–1.0)
Monocytes Relative: 9 %
Neutro Abs: 3.3 10*3/uL (ref 1.7–7.7)
Neutrophils Relative %: 53 %
Platelet Count: 268 10*3/uL (ref 150–400)
RBC: 4.29 MIL/uL (ref 3.87–5.11)
RDW: 13.8 % (ref 11.5–15.5)
WBC Count: 6.2 10*3/uL (ref 4.0–10.5)
nRBC: 0 % (ref 0.0–0.2)

## 2023-12-08 LAB — CMP (CANCER CENTER ONLY)
ALT: 55 U/L — ABNORMAL HIGH (ref 0–44)
AST: 34 U/L (ref 15–41)
Albumin: 4.2 g/dL (ref 3.5–5.0)
Alkaline Phosphatase: 66 U/L (ref 38–126)
Anion gap: 5 (ref 5–15)
BUN: 30 mg/dL — ABNORMAL HIGH (ref 8–23)
CO2: 31 mmol/L (ref 22–32)
Calcium: 9.4 mg/dL (ref 8.9–10.3)
Chloride: 106 mmol/L (ref 98–111)
Creatinine: 1 mg/dL (ref 0.44–1.00)
GFR, Estimated: >60 mL/min (ref >60)
Glucose, Bld: 90 mg/dL (ref 70–99)
Potassium: 4.2 mmol/L (ref 3.5–5.1)
Sodium: 142 mmol/L (ref 135–145)
Total Bilirubin: 0.4 mg/dL (ref 0.0–1.2)
Total Protein: 7.6 g/dL (ref 6.5–8.1)

## 2023-12-08 NOTE — Progress Notes (Signed)
 [facility] [phone]   [phone]  PROGRESS NOTE  Patient Care Team: Shanna Cisco, NP as PCP - General (Nurse Practitioner) Susa Griffins, MD (Inactive) as Referring Physician (Cardiology)  Hematological/Oncological History  #Elevated anticardiolipin and beta-2 glycoprotein antibodies # Antiphospholipid Antibody Syndrome  1) 11/29/2010: Left Leg Doppler US after patient presented with pain and swelling in left lower extremity. Findings revealed non occlusive acute DVT involving the posterior tibial and popliteal veins of the left lower extremity. Treated with lovenox bridge and then transitioned to coumadin. Patient reports taking coumadin for two years.   2) 04/18/2021: Anticardiolipin IgG >112.0, Beta-2 Glycoprotein IgG 103.5  3) 05/20/2021: Establish care with Georga Kaufmann PA-C  HISTORY OF PRESENTING ILLNESS:  [name] 63 y.o. female returns for follow-up for elevated antiphospholipid antibodies. She is unaccompanied for this visit. She was last seen on 12/25/2022 without any changes to her health in the interim.   On exam today, [name] is currently enrolled in a weight management program the last 8 months and has lost approximately 45 lbs. She is trying to weight less than 245 lb in order to schedule her hip surgery. He uses a cane to assist with ambulation. She is otherwise feeling well. She denies any signs of symptoms of a blood clot including leg swelling, leg pain, chest pain or shortness of breath.  Overall she feels well and denies any fevers, chills, sweats, nausea, vomiting or diarrhea.  Full 10 point ROS is listed below. MEDICAL HISTORY:  Past Medical History:  Diagnosis Date   Anemia    Anxiety    Arthritis    Back pain    Chest pain    Diabetes mellitus without complication (HCC)    recent dx diet controlled   DVT (deep venous thrombosis) (HCC)    remote - took coumadin   Dysmenorrhea    Dysrhythmia     irregular heart beat   Edema    Fatty liver    Fibroid    GERD (gastroesophageal reflux disease)    hx of   Gout    Hypercholesteremia    Hypertension 4 months   Hypothyroid    Joint pain    Menometrorrhagia    Morbid obesity (HCC)    Osteoarthritis    Palpitations    Prediabetes    Substance abuse (HCC)    Vitamin D deficiency     SURGICAL HISTORY: Past Surgical History:  Procedure Laterality Date   ABDOMINAL HYSTERECTOMY  2008   BREAST BIOPSY Left 2013   benign   BUNIONECTOMY Bilateral    CHOLECYSTECTOMY     COLONOSCOPY WITH PROPOFOL N/A 10/27/2018   Procedure: COLONOSCOPY WITH PROPOFOL;  Surgeon: Graylin Shiver, MD;  Location: [facility];  Service: Endoscopy;  Laterality: N/A;   ECTOPIC PREGNANCY SURGERY  1980's   MANDIBLE OSTEOTOMY Bilateral 07/03/2013   Procedure: BILATERAL TORI;  Surgeon: Georgia Lopes, DDS;  Location: [facility] OR;  Service: Oral Surgery;  Laterality: Bilateral;   NM MYOCAR PERF WALL MOTION  07/2012   lexiscan - normal pattern of perfusion, low risk   ROTATOR CUFF REPAIR Bilateral 2009   SLEEP STUDY  07/27/2012   AHI 4.8/hr   TOOTH EXTRACTION N/A 07/03/2013   Procedure: DENTAL EXTRACTION # 20;  Surgeon: Georgia Lopes, DDS;  Location: [facility] OR;  Service: Oral Surgery;  Laterality: N/A;   TRANSTHORACIC ECHOCARDIOGRAM  07/2012   EF=>55%, mod conc LVH; LA mod dilated; trace MR; mild TR with normal  RVSP; trace AV regurg   TUBAL LIGATION      SOCIAL HISTORY: Social History   Socioeconomic History   Marital status: Single    Spouse name: Not on file   Number of children: 6   Years of education: 11   Highest education level: Not on file  Occupational History    Employer: UNEMPLOYED    Comment: Disability  Tobacco Use   Smoking status: Former    Current packs/day: 0.00    Average packs/day: 0.1 packs/day for 6.0 years (0.6 ttl pk-yrs)    Types: Cigarettes    Start date: 02/13/2005    Quit date: 02/14/2011    Years since quitting: 12.8   Smokeless  tobacco: Never  Vaping Use   Vaping status: Never Used  Substance and Sexual Activity   Alcohol use: No    Comment: Previous ETOH abuse   Drug use: No    Comment: Previous crack use    Sexual activity: Never    Birth control/protection: Abstinence  Other Topics Concern   Not on file  Social History Narrative   Not on file   Social Drivers of Health   Financial Resource Strain: Not on file  Food Insecurity: Not on file  Transportation Needs: Not on file  Physical Activity: Not on file  Stress: Not on file  Social Connections: Not on file  Intimate Partner Violence: Not on file    FAMILY HISTORY: Family History  Problem Relation Age of Onset   Thyroid disease Mother    Hyperlipidemia Mother    Diabetes Mother    Hypertension Mother    Cancer Mother    Deep vein thrombosis Mother        and PE   Heart disease Mother    Kidney disease Mother    Diabetes Father    Hypertension Father    Cancer Father 57   Throat cancer Brother 28   Pulmonary embolism Daughter    Heart disease Other        No family history    ALLERGIES:  is allergic to buprenorphine hcl-naloxone hcl, naloxone, and dilaudid [hydromorphone hcl].  MEDICATIONS:  Current Outpatient Medications  Medication Sig Dispense Refill   allopurinol (ZYLOPRIM) 100 MG tablet Take 100 mg by mouth daily.     aspirin 81 MG chewable tablet Chew 81 mg by mouth daily.     atorvastatin (LIPITOR) 40 MG tablet Take 40 mg by mouth daily.     Blood Glucose Monitoring Suppl (ACCU-CHEK GUIDE ME) w/Device KIT      cyclobenzaprine (FLEXERIL) 10 MG tablet Take 10 mg by mouth 2 (two) times daily as needed for muscle spasms.      diclofenac sodium (VOLTAREN) 1 % GEL Apply 4 g topically 4 (four) times daily. 100 g 0   furosemide (LASIX) 20 MG tablet Take 20 mg by mouth daily.     glucose blood test strip TEST GLUCOSE TWICE A DAY     levothyroxine (SYNTHROID) 75 MCG tablet Take 1 tablet by mouth daily.     lidocaine (XYLOCAINE) 5  % ointment Apply 1 application topically as needed for moderate pain.     lisinopril (ZESTRIL) 5 MG tablet Take 1 tablet by mouth daily.     MELATONIN PO Take by mouth at bedtime as needed.     metoprolol succinate (TOPROL-XL) 25 MG 24 hr tablet Take 1 tablet (25 mg total) by mouth daily. 30 tablet 0   NARCAN 4 MG/0.1ML LIQD nasal spray kit  SMARTSIG:1 Spray(s) Both Nares Once PRN     oxyCODONE-acetaminophen (PERCOCET) 10-325 MG tablet Take 1 tablet by mouth 4 (four) times daily.      potassium chloride SA (K-DUR,KLOR-CON) 20 MEQ tablet Take 20 mEq by mouth daily.     Semaglutide,0.25 or 0.5MG /DOS, (OZEMPIC, 0.25 OR 0.5 MG/DOSE,) 2 MG/1.5ML SOPN Inject 0.5 mg into the skin once a week. 1.5 mL 0   Vitamin D, Ergocalciferol, (DRISDOL) 1.25 MG (50000 UNIT) CAPS capsule Take 1 capsule (50,000 Units total) by mouth every 7 (seven) days. 4 capsule 0   No current facility-administered medications for this visit.    REVIEW OF SYSTEMS:   Constitutional: ( - ) fevers, ( - )  chills , ( - ) night sweats Eyes: ( - ) blurriness of vision, ( - ) double vision, ( - ) watery eyes Ears, nose, mouth, throat, and face: ( - ) mucositis, ( - ) sore throat Respiratory: ( - ) cough, ( - ) dyspnea, ( - ) wheezes Cardiovascular: ( - ) palpitation, ( - ) chest discomfort, ( - ) lower extremity swelling Gastrointestinal:  ( - ) nausea, ( - ) heartburn, ( - ) change in bowel habits Skin: ( - ) abnormal skin rashes Lymphatics: ( - ) new lymphadenopathy, ( - ) easy bruising Neurological: ( - ) numbness, ( - ) tingling, ( - ) new weaknesses Behavioral/Psych: ( - ) mood change, ( - ) new changes  All other systems were reviewed with the patient and are negative.  PHYSICAL EXAMINATION: ECOG PERFORMANCE STATUS: 0 - Asymptomatic  Vitals:   12/08/23 1047  BP: 106/73  Pulse: (!) 57  Resp: 16  Temp: 97.6 F (36.4 C)  SpO2: 100%     Filed Weights   12/08/23 1047  Weight: 262 lb 4.8 oz (119 kg)     GENERAL:  well appearing female in NAD  SKIN: skin color, texture, turgor are normal, no rashes or significant lesions EYES: conjunctiva are pink and non-injected, sclera clear OROPHARYNX: no exudate, no erythema; lips, buccal mucosa, and tongue normal  LUNGS: clear to auscultation and percussion with normal breathing effort HEART: regular rate & rhythm and no murmurs and no lower extremity edema Musculoskeletal: no cyanosis of digits and no clubbing  PSYCH: alert & oriented x 3, fluent speech NEURO: no focal motor/sensory deficits  LABORATORY DATA:  I have reviewed the data as listed    Latest Ref Rng & Units 12/08/2023   10:26 AM 10/28/2023    8:13 AM 07/22/2023   12:00 AM  CBC  WBC 4.0 - 10.5 K/uL 6.2  5.5  5.0      Hemoglobin 12.0 - 15.0 g/dL 16.1  09.6  04.5      Hematocrit 36.0 - 46.0 % 37.2  41.2  36      Platelets 150 - 400 K/uL 268  272  294         This result is from an external source.       Latest Ref Rng & Units 12/08/2023   10:26 AM 10/28/2023    8:13 AM 07/22/2023   12:00 AM  CMP  Glucose 70 - 99 mg/dL 90  92    BUN 8 - 23 mg/dL 30  22  11       Creatinine 0.44 - 1.00 mg/dL 4.09  8.11  0.8      Sodium 135 - 145 mmol/L 142  142  143      Potassium 3.5 -  5.1 mmol/L 4.2  4.4  4.7      Chloride 98 - 111 mmol/L 106  105  106      CO2 22 - 32 mmol/L 31  25  32      Calcium 8.9 - 10.3 mg/dL 9.4  9.4  9.1      Total Protein 6.5 - 8.1 g/dL 7.6  7.2    Total Bilirubin 0.0 - 1.2 mg/dL 0.4  0.3    Alkaline Phos 38 - 126 U/L 66  79  58      AST 15 - 41 U/L 34  28  28      ALT 0 - 44 U/L 55  46  58         This result is from an external source.   ASSESSMENT & PLAN [name] is a 63 y.o. female returns for a follow up for elevated Anticardiolipin and Beta-2 Glycoprotein antibodies.   #Positive antiphospholipid antibodies: -Labs from 04/18/2021 showed elevated beta-2 glycoprotein IgG at 103.5 and anticardiolipin IgG >112.0.  Remained elevated on repeat testing 12 weeks  later.  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 -I reviewed the recommendations to initiate anticoagulation with warfarin due to elevated antibody levels but patient again declined and would like to continue on ASA 81 mg BID. If she were to change her mind we would be happy to see her back early and start this therapy. --Labs today showed white blood cell count 6.2, hemoglobin 12.8, MCV 86.7, and platelets of 268. Creatinine normal. --Patient is waiting to loose more weight before undergoing hip surgery.  Patient's Caprini VTE score is 13 points which equates to a 10.7% VTE risk.  Recommendation will be 30 days of Lovenox therapy after surgery.  Patient will follow-up with [place] once her surgical date is finalized so we can send a prescription. --RTC in 1 year with labs   No orders of the defined types were placed in this encounter.  All questions were answered. The patient knows to call the clinic with any problems, questions or concerns.  I have spent a total of 25 minutes minutes of face-to-face and non-face-to-face time, preparing to see the patient, performing a medically appropriate examination, counseling and educating the patient, documenting clinical information in the electronic health record, and care coordination.   Georga Kaufmann PA-C Dept of Hematology and Oncology [facility] at [facility] Phone: [phone]\

## 2023-12-15 ENCOUNTER — Ambulatory Visit: Payer: Medicaid Other | Admitting: Hematology and Oncology

## 2023-12-15 ENCOUNTER — Other Ambulatory Visit: Payer: Medicaid Other

## 2023-12-23 ENCOUNTER — Ambulatory Visit (INDEPENDENT_AMBULATORY_CARE_PROVIDER_SITE_OTHER): Payer: Medicaid Other | Admitting: Family Medicine

## 2023-12-23 ENCOUNTER — Encounter (INDEPENDENT_AMBULATORY_CARE_PROVIDER_SITE_OTHER): Payer: Self-pay | Admitting: Family Medicine

## 2023-12-23 VITALS — BP 108/68 | HR 67 | Temp 97.7°F | Ht 66.0 in | Wt 256.0 lb

## 2023-12-23 DIAGNOSIS — Z6841 Body Mass Index (BMI) 40.0 and over, adult: Secondary | ICD-10-CM

## 2023-12-23 DIAGNOSIS — E1169 Type 2 diabetes mellitus with other specified complication: Secondary | ICD-10-CM | POA: Diagnosis not present

## 2023-12-23 DIAGNOSIS — E559 Vitamin D deficiency, unspecified: Secondary | ICD-10-CM

## 2023-12-23 DIAGNOSIS — Z7985 Long-term (current) use of injectable non-insulin antidiabetic drugs: Secondary | ICD-10-CM

## 2023-12-23 DIAGNOSIS — E1159 Type 2 diabetes mellitus with other circulatory complications: Secondary | ICD-10-CM | POA: Diagnosis not present

## 2023-12-23 DIAGNOSIS — I152 Hypertension secondary to endocrine disorders: Secondary | ICD-10-CM | POA: Diagnosis not present

## 2023-12-23 MED ORDER — VITAMIN D (ERGOCALCIFEROL) 1.25 MG (50000 UNIT) PO CAPS
50000.0000 [IU] | ORAL_CAPSULE | ORAL | 0 refills | Status: DC
Start: 2023-12-23 — End: 2024-01-25

## 2023-12-23 NOTE — Progress Notes (Signed)
 [name], D.O.  ABFM, ABOM Specializing in Clinical Bariatric Medicine  Office located at: [address] [place], [place]  [address]   Assessment and Plan:   RECHECK Hemoglobin A1c and Vitamin D next OV  FOR THE DISEASE OF OBESITY:  BMI 40.0-44.9, adult (HCC), current BMI 41.34 OBESITY, MORBID with starting BMI 45 Assessment & Plan: Since last office visit on 11/25/2023 patient's  Muscle mass has increased by 4 lb. Fat mass has decreased by 7.4 lb. Total body water has increased by 7.6 lb.  Counseling done on how various foods will affect these numbers and how to maximize success  Total lbs lost to date: 24 lbs  Total weight loss percentage to date: 8.57%    Recommended Dietary Goals [name] is currently in the action stage of change. As such, her goal is to continue weight management plan.  She has agreed to: continue current plan   Behavioral Intervention We discussed the following today: continue to work on maintaining a reduced calorie state, getting the recommended amount of protein, incorporating whole foods, making healthy choices, staying well hydrated and practicing mindfulness when eating.  Additional resources provided today: handout on protein equivalents of 2 ounces of meat or seafood and handout on benefits of losing 5-10% of body weight  Evidence-based interventions for health behavior change were utilized today including the discussion of self monitoring techniques, problem-solving barriers and SMART goal setting techniques.   Regarding patient's less desirable eating habits and patterns, we employed the technique of small changes.   Pt will specifically work on: n/a   Recommended Physical Activity Goals Nyella has been advised to work up to 150 minutes of moderate intensity aerobic activity a week and strengthening exercises 2-3 times per week for cardiovascular health, weight loss maintenance and preservation of muscle mass.   She has  agreed to : consider starting [employer] exercise program.   Pharmacotherapy We both agreed to : continue with nutritional and behavioral strategies and continue medication regimen   FOR ASSOCIATED CONDITIONS ADDRESSED TODAY:  Type 2 diabetes mellitus with obesity (HCC) Assessment & Plan: On Ozempic 0.5 mg weekly, denies any adverse SE. Pt not checking blood sugars. Denies symptoms associated w/ hypoglycemia or hyperglycemia. Denies hunger/cravings. Continue current medicine and balanced diet focusing on protein, fruits, and vegetables while limiting simple carbohydrates. Reminded pt if she ever feels poorly- check Blood Sugar and Blood Pressure at that time.      Hypertension associated with type 2 diabetes mellitus (HCC) Assessment & Plan: Last 3 blood pressure readings in our office are as follows: BP Readings from Last 3 Encounters:  12/23/23 108/68  12/08/23 106/73  11/25/23 112/72   On Toprol-XL, Lisinopril, and Lasix. Blood pressure is in the low-normal range - pt asymptomatic. If pt develops symptoms of dizziness or lightheadedness in the future, we will consider decreasing her Toprol-XL dose as first line of treatment. Continue adherence to all medications and low salt diet, advance exercise as able.   Vitamin D deficiency Assessment & Plan: Pt is currently on ERGO 50,000 units weekly without adverse SE. Continue supplement. Recheck next OV.   Relevant Orders:  -     Vitamin D (Ergocalciferol); Take 1 capsule (50,000 Units total) by mouth every 7 (seven) days.  Dispense: 5 capsule; Refill: 0   Follow up:   Return 01/24/2024. She was informed of the importance of frequent follow up visits to maximize her success with intensive lifestyle modifications for her multiple health conditions.  Subjective:   Chief complaint: Obesity [name] is here to discuss her progress with her obesity treatment plan. She is on the Category 2 MP with B/L options and 6 ounces of lean  protein at lunch and 8-10 ounces at dinner and states she is following her eating plan approximately 80% of the time. She states she has been dancing and or walking 10,000 steps 4 days per week   Interval History:  [name] is here for a follow up office visit. Since last OV on 11/25/2023, [name] is down 3 lbs. States that her clothes fit looser. Endorses measuring veggie and protein portions. Reports getting in 6 ounces of lean protein at lunch and 8-10 ounces at dinner. Her hunger and cravings are well controlled.   Pharmacotherapy for weight loss: She is currently taking  Ozempic 0.5 mg weekly .   Review of Systems:  Pertinent positives were addressed with patient today.  Reviewed by clinician on day of visit: allergies, medications, problem list, medical history, surgical history, family history, social history, and previous encounter notes.  Weight Summary and Biometrics   Weight Lost Since Last Visit: 3 lb  Weight Gained Since Last Visit: 0   Vitals Temp: 97.7 F (36.5 C) BP: 108/68 Pulse Rate: 67 SpO2: 98 %   Anthropometric Measurements Height: 5\' 6"  (1.676 m) Weight: 256 lb (116.1 kg) BMI (Calculated): 41.34 Weight at Last Visit: 259 lb Weight Lost Since Last Visit: 3 lb Weight Gained Since Last Visit: 0 Starting Weight: 280 lb Total Weight Loss (lbs): 24 lb (10.9 kg) Peak Weight: 330 lb   Body Composition  Body Fat %: 46 % Fat Mass (lbs): 117.8 lbs Muscle Mass (lbs): 131.2 lbs Total Body Water (lbs): 94.2 lbs Visceral Fat Rating : 15   Other Clinical Data Fasting: no Labs: no Today's Visit #: 17 Starting Date: 07/29/22   Objective:   PHYSICAL EXAM: Blood pressure 108/68, pulse 67, temperature 97.7 F (36.5 C), height 5\' 6"  (1.676 m), weight 256 lb (116.1 kg), SpO2 98%. Body mass index is 41.32 kg/m.  General: she is overweight, cooperative and in no acute distress. PSYCH: Has normal mood, affect and thought process.   HEENT: EOMI,  sclerae are anicteric. Lungs: Normal breathing effort, no conversational dyspnea. Extremities: Moves * 4 Neurologic: A and O * 3, good insight  DIAGNOSTIC DATA REVIEWED: BMET    Component Value Date/Time   NA 142 12/08/2023 1026   NA 142 10/28/2023 0813   K 4.2 12/08/2023 1026   CL 106 12/08/2023 1026   CO2 31 12/08/2023 1026   GLUCOSE 90 12/08/2023 1026   BUN 30 (H) 12/08/2023 1026   BUN 22 10/28/2023 0813   CREATININE 1.00 12/08/2023 1026   CREATININE 1.06 (H) 04/18/2021 0905   CALCIUM 9.4 12/08/2023 1026   GFRNONAA >60 12/08/2023 1026   GFRNONAA 57 (L) 04/18/2021 0905   GFRAA 66 04/18/2021 0905   Lab Results  Component Value Date   HGBA1C 5.4 10/28/2023   HGBA1C 6.0 11/20/2009   Lab Results  Component Value Date   INSULIN 8.2 07/29/2022   Lab Results  Component Value Date   TSH 1.79 07/08/2023   CBC    Component Value Date/Time   WBC 6.2 12/08/2023 1026   WBC 5.5 06/03/2023 1019   RBC 4.29 12/08/2023 1026   HGB 12.8 12/08/2023 1026   HGB 12.8 10/28/2023 0813   HCT 37.2 12/08/2023 1026   HCT 41.2 10/28/2023 0813   PLT 268 12/08/2023 1026  PLT 272 10/28/2023 0813   MCV 86.7 12/08/2023 1026   MCV 91 10/28/2023 0813   MCH 29.8 12/08/2023 1026   MCHC 34.4 12/08/2023 1026   RDW 13.8 12/08/2023 1026   RDW 14.6 10/28/2023 0813   Iron Studies No results found for: "IRON", "TIBC", "FERRITIN", "IRONPCTSAT" Lipid Panel     Component Value Date/Time   CHOL 133 07/22/2023 0000   CHOL 129 07/29/2022 0930   TRIG 72 07/22/2023 0000   HDL 56 07/22/2023 0000   HDL 59 07/29/2022 0930   CHOLHDL 2.2 07/29/2022 0930   CHOLHDL 5.0 Ratio 10/07/2009 1913   VLDL 38 10/07/2009 1913   LDLCALC 63 07/22/2023 0000   LDLCALC 53 07/29/2022 0930   Hepatic Function Panel     Component Value Date/Time   PROT 7.6 12/08/2023 1026   PROT 7.2 10/28/2023 0813   ALBUMIN 4.2 12/08/2023 1026   ALBUMIN 4.3 10/28/2023 0813   AST 34 12/08/2023 1026   ALT 55 (H) 12/08/2023 1026    ALKPHOS 66 12/08/2023 1026   BILITOT 0.4 12/08/2023 1026   BILIDIR <0.1 06/03/2023 1019   IBILI NOT CALCULATED 06/03/2023 1019      Component Value Date/Time   TSH 1.79 07/08/2023 0000   TSH 0.589 07/29/2022 0930   Nutritional Lab Results  Component Value Date   VD25OH 56.4 09/02/2023   VD25OH 82.9 07/29/2022    Attestations:   I, Special Puri, acting as a Stage manager for [employer], DO., have compiled all relevant documentation for today's office visit on behalf of Thomasene Lot, DO, while in the presence of [employer], DO.  I have reviewed the above documentation for accuracy and completeness, and I agree with the above. [name], D.O.  The 21st Century Cures Act was signed into law in 2016 which includes the topic of electronic health records.  This provides immediate access to information in MyChart.  This includes consultation notes, operative notes, office notes, lab results and pathology reports.  If you have any questions about what you read please let us know at your next visit so we can discuss your concerns and take corrective action if need be.  We are right here with you.

## 2024-01-13 IMAGING — MG MM DIGITAL SCREENING BILAT W/ TOMO AND CAD
8 of 16 series · 8 of 40 positions shown · non-contrast
Comparison: Previous exam(s).

ACR Breast Density Category a: The breast tissue is almost entirely
fatty.

CLINICAL DATA: Screening.

EXAM:
DIGITAL SCREENING BILATERAL MAMMOGRAM WITH TOMOSYNTHESIS AND CAD
TECHNIQUE: Bilateral screening digital craniocaudal and mediolateral oblique
mammograms were obtained. Bilateral screening digital breast
tomosynthesis was performed. The images were evaluated with
computer-aided detection.

[R MLO synth-2D (1 of 2)]
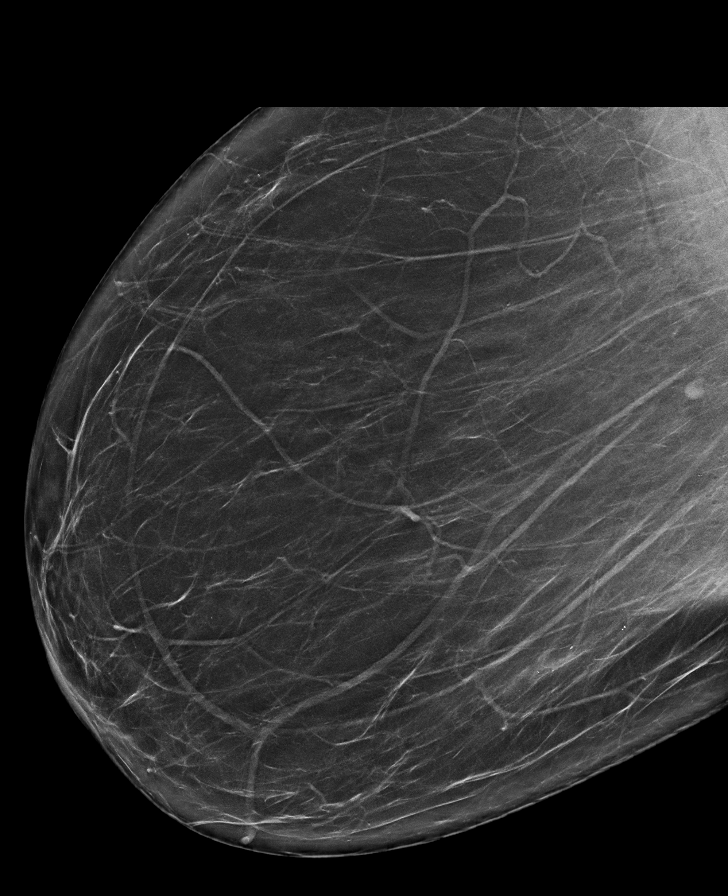

[R MLO synth-2D (2 of 2)]
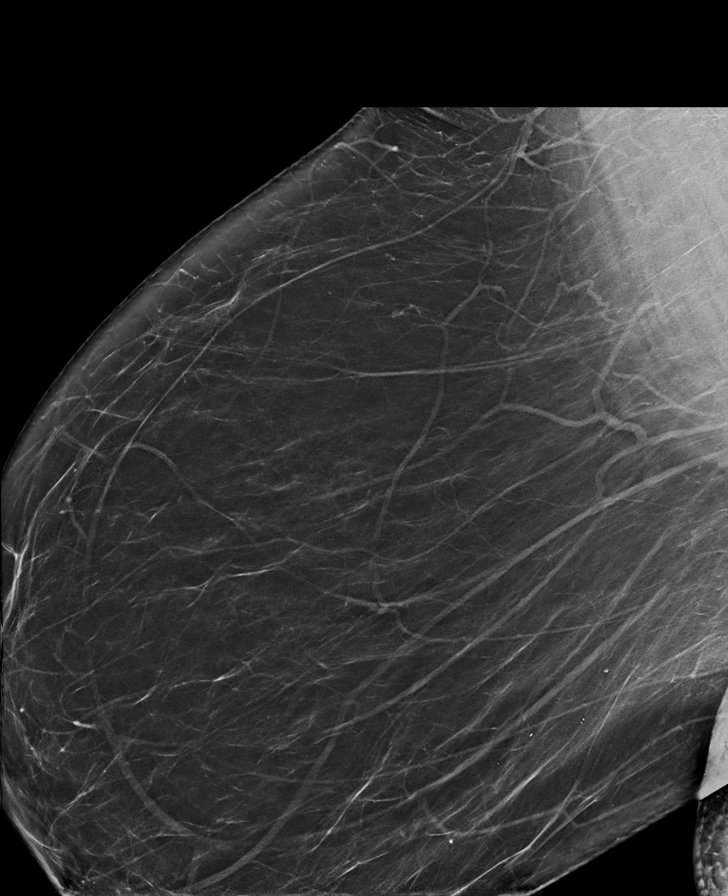

[L CV synth-2D]
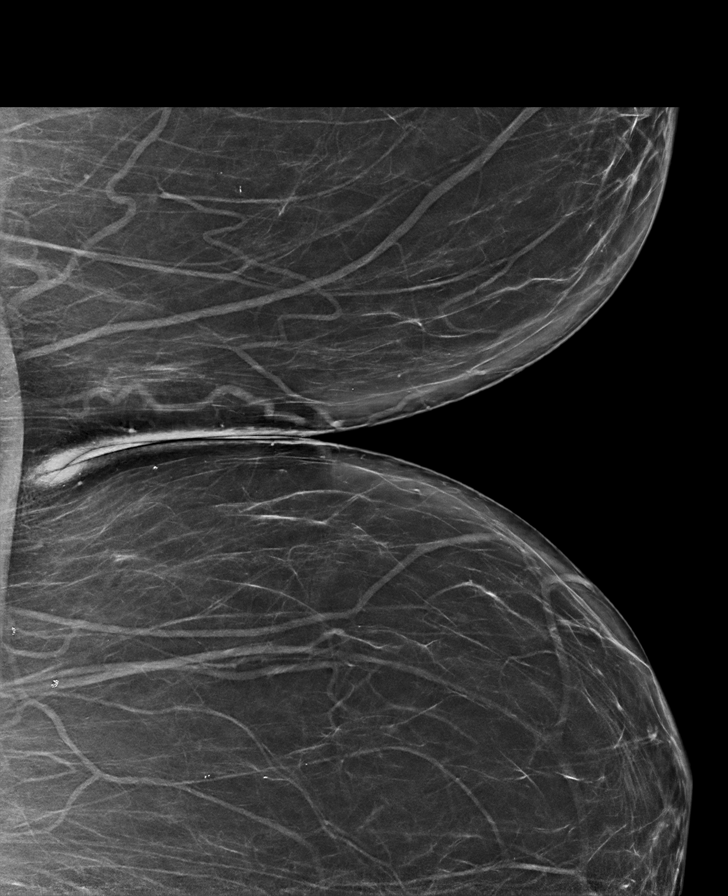

[L CC synth-2D]
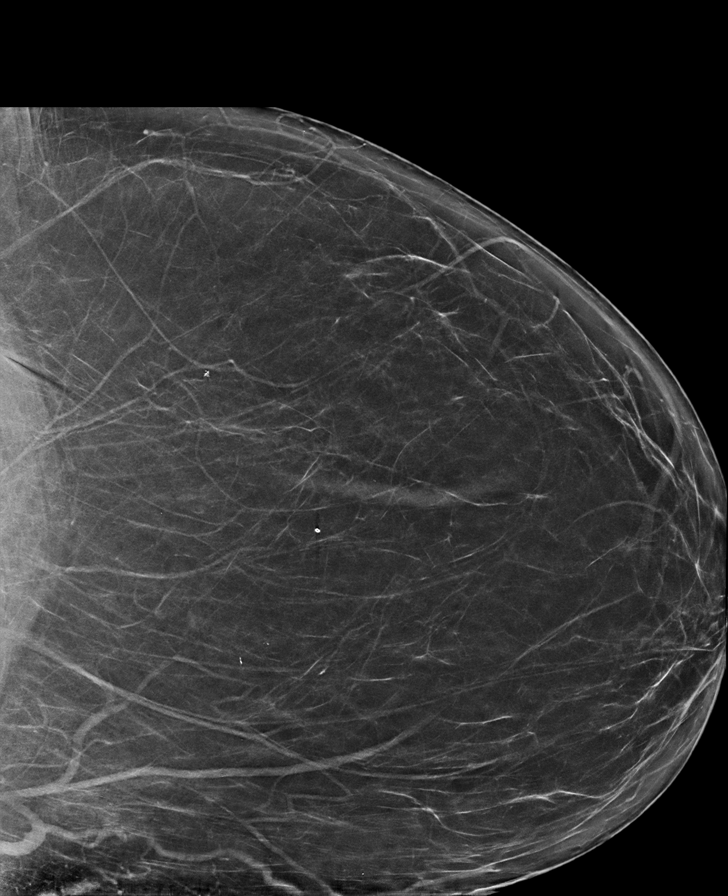

[R CC synth-2D (1 of 2)]
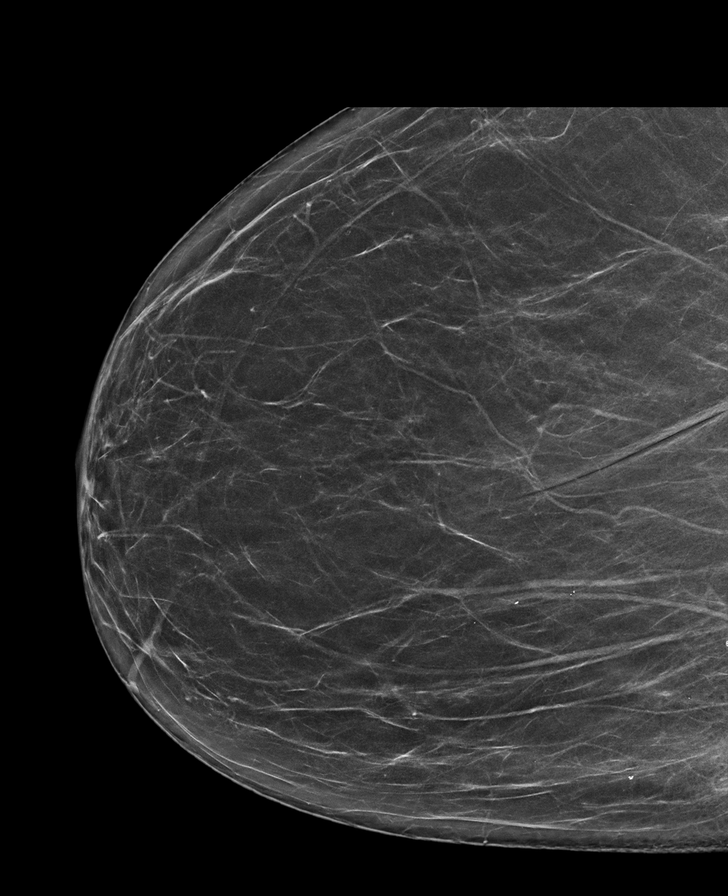

[L MLO synth-2D]
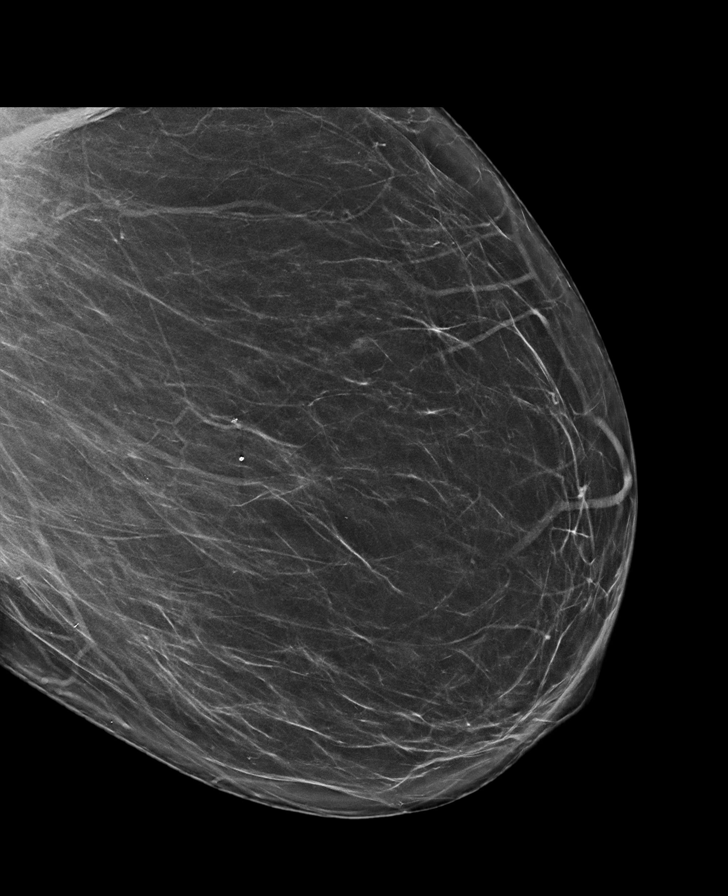

[R CC synth-2D (2 of 2)]
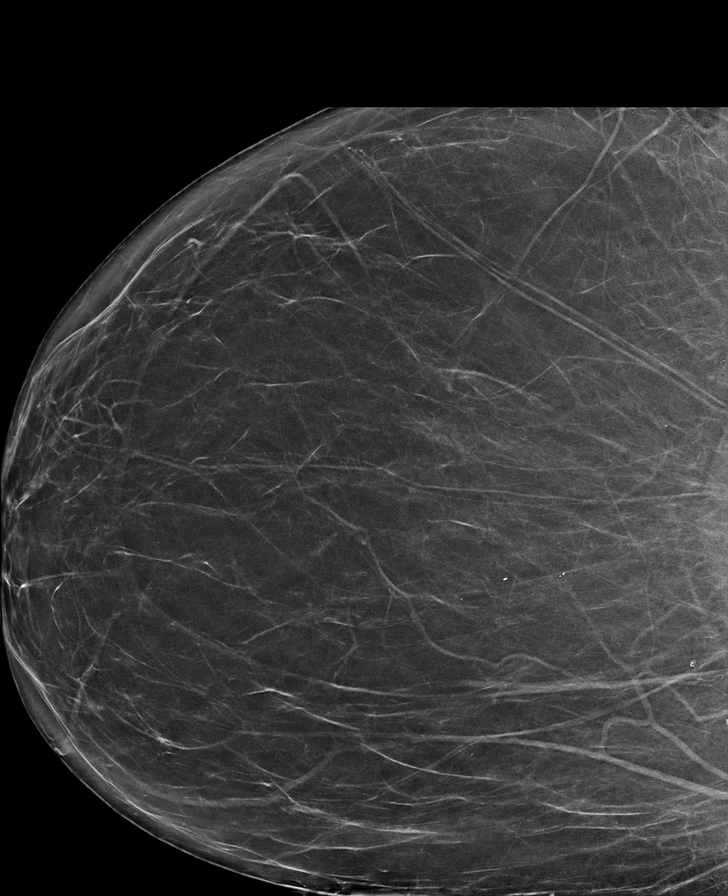

[R CC tomo · tomo slice 35/70.0]
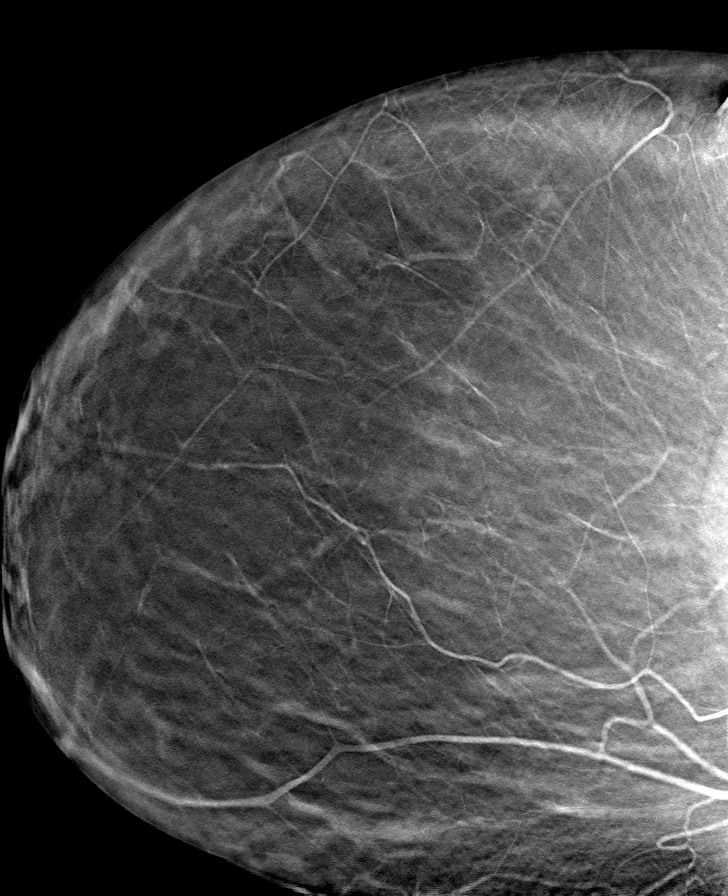

[8 of 40 positions shown; findings below may reference images not displayed]

FINDINGS: There are no findings suspicious for malignancy.
IMPRESSION: No mammographic evidence of malignancy. A result letter of this
screening mammogram will be mailed directly to the patient.

RECOMMENDATION:
Screening mammogram in one year. (Code:0E-3-N98)

BI-RADS CATEGORY  1: Negative.

## 2024-01-24 ENCOUNTER — Encounter (INDEPENDENT_AMBULATORY_CARE_PROVIDER_SITE_OTHER): Payer: Self-pay | Admitting: Adult Health

## 2024-01-24 ENCOUNTER — Ambulatory Visit (INDEPENDENT_AMBULATORY_CARE_PROVIDER_SITE_OTHER): Payer: Medicaid Other | Admitting: Family Medicine

## 2024-01-24 ENCOUNTER — Ambulatory Visit (INDEPENDENT_AMBULATORY_CARE_PROVIDER_SITE_OTHER): Admitting: Adult Health

## 2024-01-24 VITALS — BP 106/58 | HR 66 | Temp 98.4°F | Ht 66.0 in | Wt 255.0 lb

## 2024-01-24 DIAGNOSIS — K5903 Drug induced constipation: Secondary | ICD-10-CM

## 2024-01-24 DIAGNOSIS — E559 Vitamin D deficiency, unspecified: Secondary | ICD-10-CM

## 2024-01-24 DIAGNOSIS — E1159 Type 2 diabetes mellitus with other circulatory complications: Secondary | ICD-10-CM

## 2024-01-24 DIAGNOSIS — Z6841 Body Mass Index (BMI) 40.0 and over, adult: Secondary | ICD-10-CM

## 2024-01-24 DIAGNOSIS — E1169 Type 2 diabetes mellitus with other specified complication: Secondary | ICD-10-CM | POA: Diagnosis not present

## 2024-01-24 DIAGNOSIS — I152 Hypertension secondary to endocrine disorders: Secondary | ICD-10-CM | POA: Diagnosis not present

## 2024-01-24 DIAGNOSIS — E669 Obesity, unspecified: Secondary | ICD-10-CM

## 2024-01-24 DIAGNOSIS — Z7985 Long-term (current) use of injectable non-insulin antidiabetic drugs: Secondary | ICD-10-CM

## 2024-01-24 NOTE — Progress Notes (Signed)
 WEIGHT SUMMARY AND BIOMETRICS  Vitals Temp: 98.4 F (36.9 C) BP: (!) 106/58 Pulse Rate: 66 SpO2: 100 %   Anthropometric Measurements Height: 5\' 6"  (1.676 m) Weight: 255 lb (115.7 kg) BMI (Calculated): 41.18 Weight at Last Visit: 256 lb Weight Lost Since Last Visit: 1 lb Weight Gained Since Last Visit: 0 Starting Weight: 280 lb Total Weight Loss (lbs): 25 lb (11.3 kg) Peak Weight: 330 lb   Body Composition  Body Fat %: 48 % Fat Mass (lbs): 122.6 lbs Muscle Mass (lbs): 126.2 lbs Total Body Water (lbs): 88.2 lbs Visceral Fat Rating : 16   Other Clinical Data Fasting: no Labs: no Today's Visit #: 18 Starting Date: 07/29/22    Chief Complaint:   OBESITY [name] is here to discuss her progress with her obesity treatment plan.  She is on the the Category 2 Plan and states she is following her eating plan approximately 85 % of the time.  She states she is exercising dancing (in back yard) and walking 30 minutes 4 times per week.   Interim History:  She is on weekly Ozempic 0.5mg - she injects on Friday Denies mass in neck, dysphagia, dyspepsia, persistent hoarseness, abdominal pain, or N/V/C   Hunger/appetite-stable appetite  Exercise-she will achieves >7K steps a day, at least 4 x week  Hydration-she estimates to drink >120 oz water/day  Interval Health Goals: Current weight 255 lbs, goal weight 210 lbs Highest weight per pt 324 lbs  She is looking forward to 14 day [name] cruise Oct 2025  Subjective:   1. Vitamin D deficiency  Latest Reference Range & Units 09/02/23 08:54  Vitamin D, 25-Hydroxy 30.0 - 100.0 ng/mL 56.4   PCP is managing Ergocalciferol- she reports taking every other week She endorses stable energy levels She denies N/V/Muscle Weakness  2. Type 2 diabetes mellitus with obesity (HCC) Lab Results  Component Value Date   HGBA1C 5.4 10/28/2023   HGBA1C 5.3 07/22/2023   HGBA1C 5.6 07/29/2022   Home fasting CBG will range  87-120, with most readings in mid 90s to low 110s She denies sx's of hypoglycemia She is on weekly Ozempic 0.5mg  Denies mass in neck, dysphagia, dyspepsia, persistent hoarseness, abdominal pain, or N/V/C   3. Hypertension associated with type 2 diabetes mellitus (HCC) BP excellent at OV She denies sx's of hypotension She is currently on: atorvastatin (LIPITOR) 40 MG tablet  furosemide (LASIX) 20 MG tablet  aspirin 81 MG chewable tablet  lisinopril (ZESTRIL) 5 MG tablet  metoprolol succinate (TOPROL-XL) 25 MG 24 hr tablet   4. Drug Induced Constipation PDMP reviewed-  Oxycodone-Acetaminophen 10-325 180.00 30 D   [name] reports chronic bilateral knee/hip and LBP  [name] briefly tried daily Linzess- ineffective for bowel mgt She is not using daily OTC Miralax- reports 1-2 BMs/day She denies abdominal pain or hematochezia  She has screening colonoscopy on 02/27/2024 She denies known first degree family hx of colon cancer  Assessment/Plan:   1. Vitamin D deficiency Check Labs - VITAMIN D 25 Hydroxy (Vit-D Deficiency, Fractures) MyChart pt after Vit D Level and directions on Ergocalciferol therapy  2. Type 2 diabetes mellitus with obesity (HCC) (Primary) Check Labs - Hemoglobin A1c She denies need for GLP-1 refill today  3. Hypertension associated with type 2 diabetes mellitus (HCC) Continue prescribed MP and regular exercise Continue atorvastatin (LIPITOR) 40 MG tablet  furosemide (LASIX) 20 MG tablet  aspirin 81 MG chewable tablet  lisinopril (ZESTRIL) 5 MG tablet  metoprolol succinate (  TOPROL-XL) 25 MG 24 hr tablet   4. Drug Induced Constipation Remain well hydrated and consume adequate fiber intake Continue regular exercise Continue OTC Miralax  5. BMI 40.0-44.9, adult (HCC), current BMI 41.3  [name] is currently in the action stage of change. As such, her goal is to continue with weight loss efforts. She has agreed to the Category 2 Plan.    Exercise goals: All adults should avoid inactivity. Some physical activity is better than none, and adults who participate in any amount of physical activity gain some health benefits. Adults should also include muscle-strengthening activities that involve all major muscle groups on 2 or more days a week.  Behavioral modification strategies: increasing lean protein intake, decreasing simple carbohydrates, increasing vegetables, increasing water intake, no skipping meals, meal planning and cooking strategies, keeping healthy foods in the home, ways to avoid boredom eating, and planning for success.  [name] has agreed to follow-up with our clinic in 4 weeks. She was informed of the importance of frequent follow-up visits to maximize her success with intensive lifestyle modifications for her multiple health conditions.   [name] was informed we would discuss her lab results at her next visit unless there is a critical issue that needs to be addressed sooner. [name] agreed to keep her next visit at the agreed upon time to discuss these results.  Objective:   Blood pressure (!) 106/58, pulse 66, temperature 98.4 F (36.9 C), height 5\' 6"  (1.676 m), weight 255 lb (115.7 kg), SpO2 100%. Body mass index is 41.16 kg/m.  General: Cooperative, alert, well developed, in no acute distress. HEENT: Conjunctivae and lids unremarkable. Cardiovascular: Regular rhythm.  Lungs: Normal work of breathing. Neurologic: No focal deficits.   Lab Results  Component Value Date   CREATININE 1.00 12/08/2023   BUN 30 (H) 12/08/2023   NA 142 12/08/2023   K 4.2 12/08/2023   CL 106 12/08/2023   CO2 31 12/08/2023   Lab Results  Component Value Date   ALT 55 (H) 12/08/2023   AST 34 12/08/2023   ALKPHOS 66 12/08/2023   BILITOT 0.4 12/08/2023   Lab Results  Component Value Date   HGBA1C 5.4 10/28/2023   HGBA1C 5.3 07/22/2023   HGBA1C 5.6 07/29/2022   HGBA1C 6.4 (H) 06/19/2013   HGBA1C 6.0 11/20/2009    Lab Results  Component Value Date   INSULIN 8.2 07/29/2022   Lab Results  Component Value Date   TSH 1.79 07/08/2023   Lab Results  Component Value Date   CHOL 133 07/22/2023   HDL 56 07/22/2023   LDLCALC 63 07/22/2023   TRIG 72 07/22/2023   CHOLHDL 2.2 07/29/2022   Lab Results  Component Value Date   VD25OH 56.4 09/02/2023   VD25OH 82.9 07/29/2022   Lab Results  Component Value Date   WBC 6.2 12/08/2023   HGB 12.8 12/08/2023   HCT 37.2 12/08/2023   MCV 86.7 12/08/2023   PLT 268 12/08/2023   No results found for: "IRON", "TIBC", "FERRITIN"  Attestation Statements:   Reviewed by clinician on day of visit: allergies, medications, problem list, medical history, surgical history, family history, social history, and previous encounter notes.  I have reviewed the above documentation for accuracy and completeness, and I agree with the above. -  Lijah Bourque d. Broderick Fonseca, NP-C

## 2024-01-25 ENCOUNTER — Encounter (INDEPENDENT_AMBULATORY_CARE_PROVIDER_SITE_OTHER): Payer: Self-pay | Admitting: Adult Health

## 2024-01-25 ENCOUNTER — Other Ambulatory Visit (INDEPENDENT_AMBULATORY_CARE_PROVIDER_SITE_OTHER): Payer: Self-pay | Admitting: Adult Health

## 2024-01-25 DIAGNOSIS — E559 Vitamin D deficiency, unspecified: Secondary | ICD-10-CM

## 2024-01-25 LAB — HEMOGLOBIN A1C
Est. average glucose Bld gHb Est-mCnc: 103 mg/dL
Hgb A1c MFr Bld: 5.2 % (ref 4.8–5.6)

## 2024-01-25 LAB — VITAMIN D 25 HYDROXY (VIT D DEFICIENCY, FRACTURES): Vit D, 25-Hydroxy: 76.6 ng/mL (ref 30.0–100.0)

## 2024-01-25 MED ORDER — VITAMIN D (ERGOCALCIFEROL) 1.25 MG (50000 UNIT) PO CAPS: 50000.0000 [IU] | ORAL_CAPSULE | ORAL | 0 refills | Status: DC

## 2024-02-05 ENCOUNTER — Other Ambulatory Visit (INDEPENDENT_AMBULATORY_CARE_PROVIDER_SITE_OTHER): Payer: Self-pay | Admitting: Family Medicine

## 2024-02-05 DIAGNOSIS — E1169 Type 2 diabetes mellitus with other specified complication: Secondary | ICD-10-CM

## 2024-02-14 ENCOUNTER — Other Ambulatory Visit: Payer: Self-pay | Admitting: Family Medicine

## 2024-02-14 DIAGNOSIS — Z1231 Encounter for screening mammogram for malignant neoplasm of breast: Secondary | ICD-10-CM

## 2024-02-17 LAB — LAB REPORT - SCANNED
A1c: 5.1
EGFR: 80
Free T4: 1.22 ng/dL
TSH: 1.02 (ref 0.41–5.90)

## 2024-02-21 ENCOUNTER — Encounter (INDEPENDENT_AMBULATORY_CARE_PROVIDER_SITE_OTHER): Payer: Self-pay | Admitting: Family Medicine

## 2024-02-21 ENCOUNTER — Ambulatory Visit (INDEPENDENT_AMBULATORY_CARE_PROVIDER_SITE_OTHER): Admitting: Family Medicine

## 2024-02-21 VITALS — BP 112/64 | HR 63 | Temp 98.3°F | Ht 66.0 in | Wt 255.0 lb

## 2024-02-21 DIAGNOSIS — Z6841 Body Mass Index (BMI) 40.0 and over, adult: Secondary | ICD-10-CM

## 2024-02-21 DIAGNOSIS — I152 Hypertension secondary to endocrine disorders: Secondary | ICD-10-CM

## 2024-02-21 DIAGNOSIS — E559 Vitamin D deficiency, unspecified: Secondary | ICD-10-CM | POA: Diagnosis not present

## 2024-02-21 DIAGNOSIS — E1159 Type 2 diabetes mellitus with other circulatory complications: Secondary | ICD-10-CM

## 2024-02-21 DIAGNOSIS — E1169 Type 2 diabetes mellitus with other specified complication: Secondary | ICD-10-CM | POA: Diagnosis not present

## 2024-02-21 DIAGNOSIS — Z7985 Long-term (current) use of injectable non-insulin antidiabetic drugs: Secondary | ICD-10-CM

## 2024-02-21 MED ORDER — VITAMIN D (ERGOCALCIFEROL) 1.25 MG (50000 UNIT) PO CAPS: ORAL_CAPSULE | ORAL | Status: DC

## 2024-02-21 NOTE — Progress Notes (Signed)
 Rae Bugler, D.O.  ABFM, ABOM Specializing in Clinical Bariatric Medicine  Office located at: [address] [place], [place]  [address]   Assessment and Plan:  No orders of the defined types were placed in this encounter.   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 Fat mass has increased by 0.6 lb. Total body water has increased by 0.6 lb.  Counseling done on how various foods will affect these numbers and how to maximize success  Total lbs lost to date: 25 lbs  Total weight loss percentage to date: 8.93%    Recommended Dietary Goals Karynna is currently in the action stage of change. As such, her goal is to continue weight management plan.  She has agreed to: continue current plan   Behavioral Intervention We discussed the following today: continue to work on maintaining a reduced calorie state, getting the recommended amount of protein, incorporating whole foods, making healthy choices, staying well hydrated and practicing mindfulness when eating.  Additional resources provided today: None  Evidence-based interventions for health behavior change were utilized today including the discussion of self monitoring techniques, problem-solving barriers and SMART goal setting techniques.   Regarding patient's less desirable eating habits and patterns, we employed the technique of small changes.   Pt will  specifically work on: n/a   Recommended Physical Activity Goals Gelinda has been advised to work up to 300-450 minutes of moderate intensity aerobic activity a week and strengthening exercises 2-3 times per week for cardiovascular health, weight loss maintenance and preservation of muscle mass.   She has agreed to : continue to gradually increase the amount and intensity of exercise routine   Pharmacotherapy We both agreed to : continue same regimen   ASSOCIATED CONDITIONS ADDRESSED TODAY:  Type 2 diabetes mellitus with obesity (HCC) Assessment & Plan: Most recent A1c:  Lab Results  Component Value Date   HGBA1C 5.2 01/24/2024   HGBA1C 5.4 10/28/2023   HGBA1C 5.3 07/22/2023   INSULIN  8.2 07/29/2022    HgbA1c is very well controlled for age and comorbid conditions. Denies symptoms of hypoglycemia or hyperglycemia. On Ozempic  0.5 mg wkly per her PCP with good adherence and no side effects. States her hunger and cravings are controlled. Continue GLP-1 therapy and reduced calorie meal plan low on processed crabs and simple sugars.    Hypertension associated with type 2 diabetes mellitus (HCC) Assessment & Plan: Last 3 blood pressure readings in our office are as follows: BP Readings from Last 3 Encounters:  02/21/24 112/64  01/24/24 (!) 106/58  12/23/23 108/68   The 10-year ASCVD risk score (Arnett DK, et al., 2019) is: 9.2%* (Cholesterol units were assumed)  Lab Results  Component Value Date   CREATININE 1.00 12/08/2023   Blood pressure is well-controlled on Toprol -XL 25 mg daily, Zestril 5 mg daily, and Lasix 20 mg daily. Recommend pt to discuss  w/ PCP about D/C her Toprol  as it can be counterproductive to wt loss.     Consider combination arb-cholortihlidne isntead of lisniporn   And calcium channel blocker instead of Toprol     Consider adding: combination arb-cholrotadione instead of lisponrine  or clacium channel blocker   Continue with medications and nutrition  plan.   Vitamin D  deficiency Assessment & Plan: Most recent Vitamin D : Lab Results  Component Value Date   VD25OH 76.6 01/24/2024   VD25OH 56.4 09/02/2023   VD25OH 82.9 07/29/2022   Currently on strength vitamin D  q 14 days without any adverse effects such as nausea, vomiting or muscle weakness. Her VD levels are stable. Pt advised to DECREASE her Vitamin D  to q 20 days. Recheck 2-3 months.    Follow up:   Return 03/21/2024 at 10:20 AM.She was informed of the importance of frequent follow up visits to maximize her success with intensive lifestyle modifications for her multiple health conditions.  Subjective:   Chief complaint: Obesity Arlette is here to discuss her progress with her obesity treatment plan. She is on the Category 2 Plan and states she is following her eating plan approximately 85% of the time. She states she is walking and dancing 45-60 minutes 3 days per week.  Interval History:  Nasreen Garrand is here for a follow up office visit. Since last OV on 01/24/2024, pt's wt has not changed.     Getitng in proteins  Doing well with MP Hunger and cravings are controlled Sleeping well (8-9 hrs daily) Denies daytime somnloence.     Pharmacotherapy that aid with weight loss: She is currently taking Ozempic  0.5 mg wkly.  Review of Systems:  Pertinent positives were addressed with patient today.Reviewed by clinician on day of visit: allergies, medications, problem list, medical history, surgical history, family history, social history, and previous encounter notes.  Weight Summary and Biometrics   Weight Lost Since Last Visit: 0  Weight Gained Since Last Visit: 0   Vitals Temp: 98.3 F (36.8 C) BP: 112/64 Pulse Rate: 63 SpO2: 97 %   Anthropometric Measurements Height: 5\' 6"  (1.676 m) Weight: 255 lb (115.7 kg) BMI (Calculated): 41.18 Weight at Last Visit: 255lb Weight Lost Since Last Visit: 0 Weight Gained Since Last Visit: 0 Starting Weight:  280lb Total Weight Loss (lbs): 25 lb (11.3 kg) Peak Weight: 330lb   Body Composition  Body Fat %: 48.3 % Fat Mass (lbs): 123.2 lbs Muscle Mass (lbs): 125.2 lbs Total Body Water (lbs): 88.8 lbs Visceral Fat Rating : 16   Other Clinical Data Fasting: no Labs: no Today's Visit #: 19 Starting Date: 07/29/22   Objective:   PHYSICAL EXAM: Blood pressure 112/64, pulse 63, temperature 98.3 F (36.8 C), height 5\' 6"  (1.676 m), weight 255 lb (115.7 kg), SpO2 97%. Body mass index is 41.16 kg/m.  General: she is overweight, cooperative and in no acute distress. PSYCH: Has normal mood, affect and thought process.   HEENT: EOMI, sclerae are anicteric. Lungs: Normal breathing effort, no conversational dyspnea. Extremities: Moves * 4 Neurologic: A and O * 3, good insight  DIAGNOSTIC DATA REVIEWED: BMET    Component Value Date/Time   NA 142 12/08/2023 1026   NA 142 10/28/2023 0813   K 4.2 12/08/2023 1026   CL 106 12/08/2023 1026   CO2 31 12/08/2023 1026   GLUCOSE 90 12/08/2023 1026   BUN 30 (H) 12/08/2023 1026   BUN 22 10/28/2023 0813   CREATININE 1.00 12/08/2023 1026   CREATININE  1.06 (H) 04/18/2021 0905   CALCIUM 9.4 12/08/2023 1026   GFRNONAA >60 12/08/2023 1026   GFRNONAA 57 (L) 04/18/2021 0905   GFRAA 66 04/18/2021 0905   Lab Results  Component Value Date   HGBA1C 5.2 01/24/2024   HGBA1C 6.0 11/20/2009   Lab Results  Component Value Date   INSULIN  8.2 07/29/2022   Lab Results  Component Value Date   TSH 1.79 07/08/2023   CBC    Component Value Date/Time   WBC 6.2 12/08/2023 1026   WBC 5.5 06/03/2023 1019   RBC 4.29 12/08/2023 1026   HGB 12.8 12/08/2023 1026   HGB 12.8 10/28/2023 0813   HCT 37.2 12/08/2023 1026   HCT 41.2 10/28/2023 0813   PLT 268 12/08/2023 1026   PLT 272 10/28/2023 0813   MCV 86.7 12/08/2023 1026   MCV 91 10/28/2023 0813   MCH 29.8 12/08/2023 1026   MCHC 34.4 12/08/2023 1026   RDW 13.8 12/08/2023 1026   RDW 14.6 10/28/2023 0813    Iron Studies No results found for: "IRON", "TIBC", "FERRITIN", "IRONPCTSAT" Lipid Panel     Component Value Date/Time   CHOL 133 07/22/2023 0000   CHOL 129 07/29/2022 0930   TRIG 72 07/22/2023 0000   HDL 56 07/22/2023 0000   HDL 59 07/29/2022 0930   CHOLHDL 2.2 07/29/2022 0930   CHOLHDL 5.0 Ratio 10/07/2009 1913   VLDL 38 10/07/2009 1913   LDLCALC 63 07/22/2023 0000   LDLCALC 53 07/29/2022 0930   Hepatic Function Panel     Component Value Date/Time   PROT 7.6 12/08/2023 1026   PROT 7.2 10/28/2023 0813   ALBUMIN 4.2 12/08/2023 1026   ALBUMIN 4.3 10/28/2023 0813   AST 34 12/08/2023 1026   ALT 55 (H) 12/08/2023 1026   ALKPHOS 66 12/08/2023 1026   BILITOT 0.4 12/08/2023 1026   BILIDIR <0.1 06/03/2023 1019   IBILI NOT CALCULATED 06/03/2023 1019      Component Value Date/Time   TSH 1.79 07/08/2023 0000   TSH 0.589 07/29/2022 0930   Nutritional Lab Results  Component Value Date   VD25OH 76.6 01/24/2024   VD25OH 56.4 09/02/2023   VD25OH 82.9 07/29/2022    Attestations:   I, Special Puri, acting as a Stage manager for [employer], DO., have compiled all relevant documentation for today's office visit on behalf of Marceil Sensor, DO, while in the presence of [employer], DO.  I have spent X minutes in the care of the patient today including: preparing to see patient (e.g. review and interpretation of tests, old notes ), obtaining and/or reviewing separately obtained history, performing a medically appropriate examination or evaluation, counseling and educating the patient, ordering medications, test or procedures, documenting clinical information in the electronic or other health care record, and independently interpreting results and communicating results to the patient, family, or caregiver   I have reviewed the above documentation for accuracy and completeness, and I agree with the above. Rae Bugler, D.O.  The 21st Century Cures Act was signed into  law in 2016 which includes the topic of electronic health records.  This provides immediate access to information in MyChart.  This includes consultation notes, operative notes, office notes, lab results and pathology reports.  If you have any questions about what you read please let us  know at your next visit so we can discuss your concerns and take corrective action if need be.  We are right here with you.

## 2024-03-21 ENCOUNTER — Encounter (INDEPENDENT_AMBULATORY_CARE_PROVIDER_SITE_OTHER): Payer: Self-pay | Admitting: Family Medicine

## 2024-03-21 ENCOUNTER — Ambulatory Visit (INDEPENDENT_AMBULATORY_CARE_PROVIDER_SITE_OTHER): Admitting: Family Medicine

## 2024-03-21 ENCOUNTER — Ambulatory Visit (INDEPENDENT_AMBULATORY_CARE_PROVIDER_SITE_OTHER): Payer: Self-pay

## 2024-03-21 ENCOUNTER — Ambulatory Visit: Admitting: Orthopaedic Surgery

## 2024-03-21 VITALS — BP 104/70 | HR 72 | Temp 97.8°F | Ht 66.0 in | Wt 254.0 lb

## 2024-03-21 DIAGNOSIS — Z7985 Long-term (current) use of injectable non-insulin antidiabetic drugs: Secondary | ICD-10-CM

## 2024-03-21 DIAGNOSIS — Z6841 Body Mass Index (BMI) 40.0 and over, adult: Secondary | ICD-10-CM | POA: Diagnosis not present

## 2024-03-21 DIAGNOSIS — E559 Vitamin D deficiency, unspecified: Secondary | ICD-10-CM | POA: Diagnosis not present

## 2024-03-21 DIAGNOSIS — M1711 Unilateral primary osteoarthritis, right knee: Secondary | ICD-10-CM

## 2024-03-21 DIAGNOSIS — E1169 Type 2 diabetes mellitus with other specified complication: Secondary | ICD-10-CM | POA: Diagnosis not present

## 2024-03-21 DIAGNOSIS — E1159 Type 2 diabetes mellitus with other circulatory complications: Secondary | ICD-10-CM

## 2024-03-21 DIAGNOSIS — I152 Hypertension secondary to endocrine disorders: Secondary | ICD-10-CM | POA: Diagnosis not present

## 2024-03-21 DIAGNOSIS — E669 Obesity, unspecified: Secondary | ICD-10-CM

## 2024-03-21 DIAGNOSIS — R0602 Shortness of breath: Secondary | ICD-10-CM | POA: Diagnosis not present

## 2024-03-21 MED ORDER — VITAMIN D (ERGOCALCIFEROL) 1.25 MG (50000 UNIT) PO CAPS
ORAL_CAPSULE | ORAL | 0 refills | Status: DC
Start: 2024-03-21 — End: 2024-04-18

## 2024-03-21 NOTE — Progress Notes (Signed)
 Office Visit Note   Patient: [name]           Date of Birth: 08-Feb-1961           MRN: [id number] Visit Date: 03/21/2024              Requested by: Trellis Fries, MD [address] [place],  [place] [address] PCP: Trellis Fries, MD   Assessment & Plan: Visit Diagnoses:  1. Primary osteoarthritis of right knee   2. Body mass index 40.0-44.9, adult (HCC)     Plan: History of Present Illness [name] is a 62 year old female with advanced knee arthritis who presents with knee instability and pain.  She experiences episodes of knee instability where it feels like it 'slipped', causing an inability to move or straighten it until the sensation resolves. This results in walking with stiffness. Previous cortisone injections in the knee did not provide long-lasting relief. She is currently involved in a weight management program and is trying to be active by walking, but her knee issues have slowed her down. She has not participated in physical therapy yet.  Physical Exam MUSCULOSKELETAL: Crepitus in knee with motion. Normal range of motion in knee. Ligaments intact. Strength is normal. No effusion in knee.  Results RADIOLOGY Knee X-ray: Advanced osteoarthritis (08/2023)  Assessment and Plan Knee osteoarthritis Advanced osteoarthritis causing instability and stiffness. Cortisone injections ineffective. Knee replacement indicated post weight loss. Medicaid limits viscosupplementation injection - Refer to physical therapy to strengthen periarticular muscles. - Advise continued weight management to qualify for knee replacement surgery.  Follow-Up Instructions: No follow-ups on file.   Orders:  Orders Placed This Encounter  Procedures   XR KNEE 3 VIEW RIGHT   Ambulatory referral to Physical Therapy   No orders of the defined types were placed in this encounter.     Procedures: No procedures performed   Clinical Data: No additional  findings.   Subjective: Chief Complaint  Patient presents with   Right Knee - Pain    HPI  Review of Systems  Constitutional: Negative.   HENT: Negative.    Eyes: Negative.   Respiratory: Negative.    Cardiovascular: Negative.   Endocrine: Negative.   Musculoskeletal: Negative.   Neurological: Negative.   Hematological: Negative.   Psychiatric/Behavioral: Negative.    All other systems reviewed and are negative.    Objective: Vital Signs: There were no vitals taken for this visit.  Physical Exam Vitals and nursing note reviewed.  Constitutional:      Appearance: She is well-developed.  HENT:     Head: Atraumatic.     Nose: Nose normal.  Eyes:     Extraocular Movements: Extraocular movements intact.  Cardiovascular:     Pulses: Normal pulses.  Pulmonary:     Effort: Pulmonary effort is normal.  Abdominal:     Palpations: Abdomen is soft.  Musculoskeletal:     Cervical back: Neck supple.  Skin:    General: Skin is warm.     Capillary Refill: Capillary refill takes less than 2 seconds.  Neurological:     Mental Status: She is alert. Mental status is at baseline.  Psychiatric:        Behavior: Behavior normal.        Thought Content: Thought content normal.        Judgment: Judgment normal.     Ortho Exam  Specialty Comments:  MRI LUMBAR SPINE WITHOUT CONTRAST   TECHNIQUE: Multiplanar, multisequence MR imaging of  the lumbar spine was performed. No intravenous contrast was administered.   COMPARISON:  Prior radiograph from a 02/02/2021 and MRI from 07/28/2017.   FINDINGS: Segmentation: Standard. Lowest well-formed disc space labeled the L5-S1 level.   Alignment: Exaggeration of the normal lumbar lordosis. Trace stepwise retrolisthesis of T12 on L1 and L1 on L2. Trace facet mediated anterolisthesis of L4 on L5.   Vertebrae: Vertebral body height maintained without acute or chronic fracture. Bone marrow signal intensity heterogeneous but  overall within normal limits. No discrete or worrisome osseous lesions. No abnormal marrow edema.   Conus medullaris and cauda equina: Conus extends to the L1-2 level. Conus and cauda equina appear normal.   Paraspinal and other soft tissues: Paraspinous soft tissues within normal limits. Few small benign appearing cyst noted about the visualized kidneys. Visualized visceral structures otherwise unremarkable.   Disc levels:   T11-12: Mild disc bulge with disc desiccation. Left greater than right facet hypertrophy. No significant spinal stenosis. Mild left foraminal narrowing. Right neural foramen remains patent.   T12-L1: Mild disc bulge with disc desiccation. No spinal stenosis. Foramina remain patent.   L1-2: Intervertebral disc space narrowing with diffuse disc bulge and disc desiccation. Superimposed right foraminal to extraforaminal disc protrusion (series 6, image 19). No spinal stenosis. Mild right L1 foraminal narrowing. Left neural foramen remains patent.   L2-3: Negative interspace. Mild facet and ligament flavum hypertrophy. No canal or foraminal stenosis.   L3-4: Negative interspace. Mild to moderate facet and ligament flavum hypertrophy. No spinal stenosis. Mild left greater than right L3 foraminal narrowing.   L4-5: Trace anterolisthesis. Mild disc bulge with disc desiccation. Superimposed small right foraminal disc protrusion closely approximates and/or contacts the exiting right L4 nerve root (series 6, image 37). Advanced bilateral facet arthrosis with ligament flavum hypertrophy. Resultant mild canal with moderate bilateral subarticular stenosis. Moderate bilateral L4 foraminal narrowing, worse on the right.   L5-S1: Disc desiccation with diffuse disc bulge. Mild reactive endplate spurring. Moderate left worse than right facet hypertrophy. No significant spinal stenosis. Moderate left worse than right L5 foraminal stenosis.   IMPRESSION: 1.  Multifactorial degenerative changes at L4-5 with resultant mild canal with moderate bilateral subarticular stenosis, with moderate bilateral L4 foraminal narrowing. Superimposed right foraminal disc protrusion at this level could potentially affect the exiting right L4 nerve root. 2. Disc bulging with facet hypertrophy at L5-S1 with resultant moderate left worse than right L5 foraminal stenosis. 3. Additional mild noncompressive disc bulging with facet hypertrophy at L1-2 and L2-3 without significant stenosis or neural impingement.     Electronically Signed   By: Virgia Griffins M.D.   On: 12/20/2021 03:21  Imaging: XR KNEE 3 VIEW RIGHT Result Date: 03/21/2024 X-rays demonstrate severe osteoarthritis.  Bone-on-bone joint space narrowing.    PMFS History: Patient Active Problem List   Diagnosis Date Noted   Prediabetes 02/22/2023   Primary osteoarthritis of both hips 12/15/2022   Central adiposity 10/20/2022   Vitamin D  deficiency 08/11/2022   Other fatigue 07/29/2022   SOBOE (shortness of breath on exertion) 07/29/2022   Osteoarthritis of left hip 07/29/2022   Other hyperlipidemia 07/29/2022   Depression screening 07/29/2022   Paresthesia 06/30/2022   Peripheral neuropathy 05/13/2022   Pain in left wrist 04/24/2022   Carpal tunnel syndrome, bilateral 03/18/2022   Plantar fasciitis of right foot 09/03/2021   Pes planus 09/03/2021   Nuclear sclerotic cataract of both eyes 05/27/2021   Diabetes mellitus without complication (HCC) 05/27/2021   Vitreomacular adhesion of both  eyes 05/27/2021   Antiphospholipid antibody positive 05/21/2021   Pain in left elbow 02/19/2021   Chronic left-sided low back pain with left-sided sciatica 07/22/2017   Thyroid  nodule 04/16/2017   Essential hypertension 03/13/2014   Menometrorrhagia    Dysmenorrhea    Anemia    Hyperlipidemia 05/23/2007   OBESITY, MORBID with starting BMI 45 05/23/2007   DYSFUNCTIONAL UTERINE BLEEDING - s/p  LAVH 05/23/2007   ROTATOR CUFF SYNDROME 05/23/2007   KNEE PAIN, HX OF 05/23/2007   Past Medical History:  Diagnosis Date   Anemia    Anxiety    Arthritis    Back pain    Chest pain    Diabetes mellitus without complication (HCC)    recent dx diet controlled   DVT (deep venous thrombosis) (HCC)    remote - took coumadin    Dysmenorrhea    Dysrhythmia    irregular heart beat   Edema    Fatty liver    Fibroid    GERD (gastroesophageal reflux disease)    hx of   Gout    Hypercholesteremia    Hypertension 4 months   Hypothyroid    Joint pain    Menometrorrhagia    Morbid obesity (HCC)    Osteoarthritis    Palpitations    Prediabetes    Substance abuse (HCC)    Vitamin D  deficiency     Family History  Problem Relation Age of Onset   Thyroid  disease Mother    Hyperlipidemia Mother    Diabetes Mother    Hypertension Mother    Cancer Mother    Deep vein thrombosis Mother        and PE   Heart disease Mother    Kidney disease Mother    Diabetes Father    Hypertension Father    Cancer Father 63   Throat cancer Brother 74   Pulmonary embolism Daughter    Heart disease Other        No family history    Past Surgical History:  Procedure Laterality Date   ABDOMINAL HYSTERECTOMY  2008   BREAST BIOPSY Left 2013   benign   BUNIONECTOMY Bilateral    CHOLECYSTECTOMY     COLONOSCOPY WITH PROPOFOL  N/A 10/27/2018   Procedure: COLONOSCOPY WITH PROPOFOL ;  Surgeon: Celedonio Coil, MD;  Location: [facility];  Service: Endoscopy;  Laterality: N/A;   ECTOPIC PREGNANCY SURGERY  1980's   MANDIBLE OSTEOTOMY Bilateral 07/03/2013   Procedure: BILATERAL TORI;  Surgeon: Cornelia Dieter, DDS;  Location: [facility] OR;  Service: Oral Surgery;  Laterality: Bilateral;   NM MYOCAR PERF WALL MOTION  07/2012   lexiscan  - normal pattern of perfusion, low risk   ROTATOR CUFF REPAIR Bilateral 2009   SLEEP STUDY  07/27/2012   AHI 4.8/hr   TOOTH EXTRACTION N/A 07/03/2013   Procedure: DENTAL EXTRACTION  # 20;  Surgeon: Cornelia Dieter, DDS;  Location: [facility] OR;  Service: Oral Surgery;  Laterality: N/A;   TRANSTHORACIC ECHOCARDIOGRAM  07/2012   EF=>55%, mod conc LVH; LA mod dilated; trace MR; mild TR with normal RVSP; trace AV regurg   TUBAL LIGATION     Social History   Occupational History    Employer: UNEMPLOYED    Comment: Disability  Tobacco Use   Smoking status: Former    Current packs/day: 0.00    Average packs/day: 0.1 packs/day for 6.0 years (0.6 ttl pk-yrs)    Types: Cigarettes    Start date: 02/13/2005    Quit date: 02/14/2011  Years since quitting: 13.1   Smokeless tobacco: Never  Vaping Use   Vaping status: Never Used  Substance and Sexual Activity   Alcohol use: No    Comment: Previous ETOH abuse   Drug use: No    Comment: Previous crack use    Sexual activity: Never    Birth control/protection: Abstinence

## 2024-03-21 NOTE — Progress Notes (Signed)
 Rae Bugler, D.O.  ABFM, ABOM Specializing in Clinical Bariatric Medicine  Office located at: [address] [place], [place]  [address]   Assessment and Plan:   Medications Discontinued During This Encounter  Medication Reason   Vitamin D , Ergocalciferol , (DRISDOL ) 1.25 MG (50000 UNIT) CAPS capsule Reorder     Meds ordered this encounter  Medications   Vitamin D , Ergocalciferol , (DRISDOL ) 1.25 MG (50000 UNIT) CAPS capsule    Sig: Change to 1 po q 20 days    Dispense:  5 capsule    Refill:  0    Bring recent labs obtained with PCP to next OV  FOR THE DISEASE OF OBESITY:  BMI 40.0-44.9, adult (HCC), current BMI 41.02 OBESITY, MORBID with starting BMI 45 Assessment & Plan: Since last office visit on 02/21/2024 patient's  Muscle mass has increased by 4.4 lb. Fat mass has decreased by 5.6 lb. Total body water has increased by 3.6 lb.  Counseling done on how various foods will affect these numbers and how to maximize success  Total lbs lost to date: 26 lbs  Total weight loss percentage to date: 9.29%    Recommended Dietary Goals Rolla is currently in the action stage of change. As such, her goal is to continue weight management plan.  She has agreed to: See SOBOE note   Behavioral Intervention We discussed the following today: continue to work on maintaining a reduced calorie state, getting the recommended amount of protein, incorporating whole foods, making healthy choices, staying well hydrated and practicing mindfulness when eating.  Additional resources provided today: Handout on CAT 1 meal plan , Handout on CAT 1-2 breakfast options, and Handout on CAT 1-2 lunch options  Evidence-based interventions for health behavior change were utilized today including the discussion of self monitoring techniques, problem-solving barriers and SMART goal setting techniques.   Regarding patient's less desirable eating habits and patterns, we employed the technique of small  changes.   Pt will specifically work on: n/a   Recommended Physical Activity Goals Chasty has been advised to work up to 300-450 minutes of moderate intensity aerobic activity a week and strengthening exercises 2-3 times per week for cardiovascular health, weight loss maintenance and preservation of muscle mass.   She has agreed to : continue to gradually increase the amount and intensity of exercise routine   Pharmacotherapy We both agreed to continue same regimen.    ASSOCIATED CONDITIONS ADDRESSED TODAY:  SOBOE (shortness of breath on exertion) Assessment & Plan: Pt states she gets out of breath more easily with climbing 2 flights of stairs and feels that this would improve with weight loss.  Denies shortness of breath at rest or orthopnea.  Repeat IC completed today to help guide our dietary regimen. It shows a VO2 of 243 and a REE of 1670 (compared to an REE of 2074 dated 07/29/2022). Her calculated basal metabolic rate is 6045 thus her resting energy expenditure is less than expected.   We reviewed measures to improve metabolism including not skipping meals, progressive strengthening exercises, increasing protein intake at every meal and maintaining adequate hydration and sleep.  Meal plan: We switched her to the modified CAT 1 with 6 ounces of lean protein at lunch; recommend her to get in at least 85 grams protein daily.    Type 2 diabetes mellitus with obesity [facility]) Assessment & Plan: Lab Results  Component Value Date   HGBA1C 5.2 01/24/2024   HGBA1C 5.4 10/28/2023   HGBA1C 5.3 07/22/2023  INSULIN  8.2 07/29/2022    HgbA1c is very well controlled for age and comorbid conditions. Denies symptoms of hypoglycemia or hyperglycemia. On Ozempic  0.5 mg wkly per her PCP with good adherence and no side effects. States her hunger and cravings are controlled. Continue GLP-1 therapy and reduced calorie meal plan low on processed crabs and simple sugars.    Hypertension  associated with type 2 diabetes mellitus (HCC) Assessment & Plan: Last 3 blood pressure readings in our office are as follows: BP Readings from Last 3 Encounters:  03/21/24 104/70  02/21/24 112/64  01/24/24 (!) 106/58   The 10-year ASCVD risk score (Arnett DK, et al., 2019) is: 7.7%* (Cholesterol units were assumed)  Lab Results  Component Value Date   CREATININE 1.00 12/08/2023   Blood pressure stable on Zestril 5 mg daily, Metoprolol  25 mg daily, and Lasix 20 mg daily. Pt asx. Continue low salt diet and medicines; consider decreasing metoprolol  dose as first line of treatment if she develops any symptoms of orthostatic hypotension.    Vitamin D  deficiency Assessment & Plan: Lab Results  Component Value Date   VD25OH 76.6 01/24/2024   VD25OH 56.4 09/02/2023   VD25OH 82.9 07/29/2022   Doing well on strength vitamin D  q 20 days; continue regimen. Recheck periodically.    Follow up:   Return 04/18/2024 at 9:20 AM. She was informed of the importance of frequent follow up visits to maximize her success with intensive lifestyle modifications for her multiple health conditions.  Subjective:   Chief complaint: Obesity Phyllis is here to discuss her progress with her obesity treatment plan. She is on the Category 2 Plan and states she is following her eating plan approximately 80% of the time. She states she is walking 20 minutes 2 days per week.  Interval History:  Nalani Andreen is here for a follow up office visit.   Since last OV on 02/21/24, Ms.Pangilinan is down 1 lb.   She is focusing more on her protein intake and is decreasing her simple carbs.   She recently saw Dr.Xu of orthopedics. The X-Ray image of her knee demonstrated severe osteoarthritis. Bone-on-bone joint space narrowing. She needs total knee replacement.   Pharmacotherapy for weight loss: She is currently taking Ozempic  0.5 mg wkly  Review of Systems:  Pertinent positives were addressed with patient  today.  Reviewed by clinician on day of visit: allergies, medications, problem list, medical history, surgical history, family history, social history, and previous encounter notes.  Weight Summary and Biometrics   Weight Lost Since Last Visit: 1lb  Weight Gained Since Last Visit: 0   Vitals Temp: 97.8 F (36.6 C) BP: 104/70 Pulse Rate: 72 SpO2: 100 %   Anthropometric Measurements Height: 5' 6 (1.676 m) Weight: 254 lb (115.2 kg) BMI (Calculated): 41.02 Weight at Last Visit: 255lb Weight Lost Since Last Visit: 1lb Weight Gained Since Last Visit: 0 Starting Weight: 280lb Total Weight Loss (lbs): 26 lb (11.8 kg) Peak Weight: 330lb   Body Composition  Body Fat %: 46.3 % Fat Mass (lbs): 117.6 lbs Muscle Mass (lbs): 129.6 lbs Total Body Water (lbs): 92.4 lbs Visceral Fat Rating : 15   Other Clinical Data RMR: 1670 Fasting: yes Labs: yes Today's Visit #: 20 Starting Date: 07/29/22 Comments: IC Completed   Objective:   PHYSICAL EXAM: Blood pressure 104/70, pulse 72, temperature 97.8 F (36.6 C), height 5' 6 (1.676 m), weight 254 lb (115.2 kg), SpO2 100%. Body mass index is 41 kg/m.  General: she  is overweight, cooperative and in no acute distress. PSYCH: Has normal mood, affect and thought process.   HEENT: EOMI, sclerae are anicteric. Lungs: Normal breathing effort, no conversational dyspnea. Extremities: Moves * 4 Neurologic: A and O * 3, good insight  DIAGNOSTIC DATA REVIEWED: BMET    Component Value Date/Time   NA 142 12/08/2023 1026   NA 142 10/28/2023 0813   K 4.2 12/08/2023 1026   CL 106 12/08/2023 1026   CO2 31 12/08/2023 1026   GLUCOSE 90 12/08/2023 1026   BUN 30 (H) 12/08/2023 1026   BUN 22 10/28/2023 0813   CREATININE 1.00 12/08/2023 1026   CREATININE 1.06 (H) 04/18/2021 0905   CALCIUM 9.4 12/08/2023 1026   GFRNONAA >60 12/08/2023 1026   GFRNONAA 57 (L) 04/18/2021 0905   GFRAA 66 04/18/2021 0905   Lab Results  Component Value Date    HGBA1C 5.2 01/24/2024   HGBA1C 6.0 11/20/2009   Lab Results  Component Value Date   INSULIN  8.2 07/29/2022   Lab Results  Component Value Date   TSH 1.79 07/08/2023   CBC    Component Value Date/Time   WBC 6.2 12/08/2023 1026   WBC 5.5 06/03/2023 1019   RBC 4.29 12/08/2023 1026   HGB 12.8 12/08/2023 1026   HGB 12.8 10/28/2023 0813   HCT 37.2 12/08/2023 1026   HCT 41.2 10/28/2023 0813   PLT 268 12/08/2023 1026   PLT 272 10/28/2023 0813   MCV 86.7 12/08/2023 1026   MCV 91 10/28/2023 0813   MCH 29.8 12/08/2023 1026   MCHC 34.4 12/08/2023 1026   RDW 13.8 12/08/2023 1026   RDW 14.6 10/28/2023 0813   Iron Studies No results found for: IRON, TIBC, FERRITIN, IRONPCTSAT Lipid Panel     Component Value Date/Time   CHOL 133 07/22/2023 0000   CHOL 129 07/29/2022 0930   TRIG 72 07/22/2023 0000   HDL 56 07/22/2023 0000   HDL 59 07/29/2022 0930   CHOLHDL 2.2 07/29/2022 0930   CHOLHDL 5.0 Ratio 10/07/2009 1913   VLDL 38 10/07/2009 1913   LDLCALC 63 07/22/2023 0000   LDLCALC 53 07/29/2022 0930   Hepatic Function Panel     Component Value Date/Time   PROT 7.6 12/08/2023 1026   PROT 7.2 10/28/2023 0813   ALBUMIN 4.2 12/08/2023 1026   ALBUMIN 4.3 10/28/2023 0813   AST 34 12/08/2023 1026   ALT 55 (H) 12/08/2023 1026   ALKPHOS 66 12/08/2023 1026   BILITOT 0.4 12/08/2023 1026   BILIDIR <0.1 06/03/2023 1019   IBILI NOT CALCULATED 06/03/2023 1019      Component Value Date/Time   TSH 1.79 07/08/2023 0000   TSH 0.589 07/29/2022 0930   Nutritional Lab Results  Component Value Date   VD25OH 76.6 01/24/2024   VD25OH 56.4 09/02/2023   VD25OH 82.9 07/29/2022    Attestations:   I, Special Puri, acting as a Stage manager for [employer], DO., have compiled all relevant documentation for today's office visit on behalf of Marceil Sensor, DO, while in the presence of [employer], DO.  I have reviewed the above documentation for accuracy and completeness,  and I agree with the above. Rae Bugler, D.O.  The 21st Century Cures Act was signed into law in 2016 which includes the topic of electronic health records.  This provides immediate access to information in MyChart.  This includes consultation notes, operative notes, office notes, lab results and pathology reports.  If you have any questions about what you read  please let us  know at your next visit so we can discuss your concerns and take corrective action if need be.  We are right here with you.

## 2024-03-22 ENCOUNTER — Ambulatory Visit
Admission: RE | Admit: 2024-03-22 | Discharge: 2024-03-22 | Disposition: A | Discharge status: Home / Self Care | Source: Ambulatory Visit | Attending: Family Medicine | Admitting: Family Medicine

## 2024-03-22 DIAGNOSIS — Z1231 Encounter for screening mammogram for malignant neoplasm of breast: Secondary | ICD-10-CM

## 2024-03-31 ENCOUNTER — Ambulatory Visit: Attending: Orthopaedic Surgery

## 2024-03-31 DIAGNOSIS — M1711 Unilateral primary osteoarthritis, right knee: Secondary | ICD-10-CM | POA: Diagnosis not present

## 2024-03-31 DIAGNOSIS — M25561 Pain in right knee: Secondary | ICD-10-CM | POA: Diagnosis present

## 2024-03-31 DIAGNOSIS — R262 Difficulty in walking, not elsewhere classified: Secondary | ICD-10-CM | POA: Diagnosis present

## 2024-03-31 DIAGNOSIS — G8929 Other chronic pain: Secondary | ICD-10-CM | POA: Insufficient documentation

## 2024-03-31 DIAGNOSIS — M6281 Muscle weakness (generalized): Secondary | ICD-10-CM | POA: Diagnosis present

## 2024-03-31 LAB — VITAMIN D 25 HYDROXY (VIT D DEFICIENCY, FRACTURES): Vit D, 25-Hydroxy: 64.34

## 2024-03-31 LAB — COMPREHENSIVE METABOLIC PANEL WITH GFR: Calcium: 9.5 (ref 8.7–10.7)

## 2024-03-31 LAB — BASIC METABOLIC PANEL WITH GFR
BUN: 25 — AB (ref 4–21)
CO2: 29 — AB (ref 13–22)
Chloride: 108 (ref 99–108)
Creatinine: 0.9 (ref 0.5–1.1)
Glucose: 85
Potassium: 4.8 meq/L (ref 3.5–5.1)
Sodium: 145 (ref 137–147)

## 2024-03-31 LAB — TSH: TSH: 1.02 (ref 0.41–5.90)

## 2024-03-31 LAB — HEPATIC FUNCTION PANEL
ALT: 70 U/L — AB (ref 7–35)
AST: 40 — AB (ref 13–35)
Alkaline Phosphatase: 58 (ref 25–125)

## 2024-03-31 LAB — LIPID PANEL
Cholesterol: 122 (ref 0–200)
HDL: 50 (ref 35–70)
LDL Cholesterol: 58
LDl/HDL Ratio: 14
Triglycerides: 71 (ref 40–160)

## 2024-03-31 LAB — CBC AND DIFFERENTIAL: Hemoglobin: 5.1 — AB (ref 12.0–16.0)

## 2024-03-31 NOTE — Therapy (Addendum)
 OUTPATIENT PHYSICAL THERAPY LOWER EXTREMITY EVALUATION   Patient Name: [name] MRN: [id number] DOB:06-Jul-1961, 63 y.o., female Today's Date: 03/31/2024  END OF SESSION:  PT End of Session - 03/31/24 0837     Visit Number 1    Number of Visits 17    Date for PT Re-Evaluation 06/02/24    Authorization Type [place] MEDICAID [employer]    PT Start Time 0845    PT Stop Time 0930    PT Time Calculation (min) 45 min    Activity Tolerance Patient tolerated treatment well    Behavior During Therapy WFL for tasks assessed/performed          Past Medical History:  Diagnosis Date   Anemia    Anxiety    Arthritis    Back pain    Chest pain    Diabetes mellitus without complication (HCC)    recent dx diet controlled   DVT (deep venous thrombosis) (HCC)    remote - took coumadin    Dysmenorrhea    Dysrhythmia    irregular heart beat   Edema    Fatty liver    Fibroid    GERD (gastroesophageal reflux disease)    hx of   Gout    Hypercholesteremia    Hypertension 4 months   Hypothyroid    Joint pain    Menometrorrhagia    Morbid obesity (HCC)    Osteoarthritis    Palpitations    Prediabetes    Substance abuse (HCC)    Vitamin D  deficiency    Past Surgical History:  Procedure Laterality Date   ABDOMINAL HYSTERECTOMY  2008   BREAST BIOPSY Left 2013   benign   BUNIONECTOMY Bilateral    CHOLECYSTECTOMY     COLONOSCOPY WITH PROPOFOL  N/A 10/27/2018   Procedure: COLONOSCOPY WITH PROPOFOL ;  Surgeon: Celedonio Coil, MD;  Location: [facility];  Service: Endoscopy;  Laterality: N/A;   ECTOPIC PREGNANCY SURGERY  1980's   MANDIBLE OSTEOTOMY Bilateral 07/03/2013   Procedure: BILATERAL TORI;  Surgeon: Cornelia Dieter, DDS;  Location: [facility];  Service: Oral Surgery;  Laterality: Bilateral;   NM MYOCAR PERF WALL MOTION  07/2012   lexiscan  - normal pattern of perfusion, low risk   ROTATOR CUFF REPAIR Bilateral 2009   SLEEP STUDY  07/27/2012   AHI 4.8/hr    TOOTH EXTRACTION N/A 07/03/2013   Procedure: DENTAL EXTRACTION # 20;  Surgeon: Cornelia Dieter, DDS;  Location: [facility];  Service: Oral Surgery;  Laterality: N/A;   TRANSTHORACIC ECHOCARDIOGRAM  07/2012   EF=>55%, mod conc LVH; LA mod dilated; trace MR; mild TR with normal RVSP; trace AV regurg   TUBAL LIGATION     Patient Active Problem List   Diagnosis Date Noted   Prediabetes 02/22/2023   Primary osteoarthritis of both hips 12/15/2022   Central adiposity 10/20/2022   Vitamin D  deficiency 08/11/2022   Other fatigue 07/29/2022   SOBOE (shortness of breath on exertion) 07/29/2022   Osteoarthritis of left hip 07/29/2022   Other hyperlipidemia 07/29/2022   Depression screening 07/29/2022   Paresthesia 06/30/2022   Peripheral neuropathy 05/13/2022   Pain in left wrist 04/24/2022   Carpal tunnel syndrome, bilateral 03/18/2022   Plantar fasciitis of right foot 09/03/2021   Pes planus 09/03/2021   Nuclear sclerotic cataract of both eyes 05/27/2021   Diabetes mellitus without complication (HCC) 05/27/2021   Vitreomacular adhesion of both eyes 05/27/2021   Antiphospholipid antibody positive 05/21/2021   Pain in left elbow 02/19/2021  Chronic left-sided low back pain with left-sided sciatica 07/22/2017   Thyroid  nodule 04/16/2017   Essential hypertension 03/13/2014   Menometrorrhagia    Dysmenorrhea    Anemia    Hyperlipidemia 05/23/2007   OBESITY, MORBID with starting BMI 45 05/23/2007   DYSFUNCTIONAL UTERINE BLEEDING - s/p LAVH 05/23/2007   ROTATOR CUFF SYNDROME 05/23/2007   KNEE PAIN, HX OF 05/23/2007    PCP: Trellis Fries, MD  REFERRING PROVIDER: Wes Hamman, MD  REFERRING DIAG: M17.11 (ICD-10-CM) - Primary osteoarthritis of right knee   THERAPY DIAG:  Chronic pain of right knee  Muscle weakness (generalized)  Difficulty in walking, not elsewhere classified  Rationale for Evaluation and Treatment: Rehabilitation  ONSET DATE: 3 weeks ago  SUBJECTIVE:   SUBJECTIVE  STATEMENT: Pt reports about 3 weeks ago she was walking fast and her R knee cap slipped out of place to the outside and she needed to walk straight legged. She experienced significant R knee pain at that time. She was told by Dr. Christiane Cowing she has bone on bone OA. Pt notes she has issues with her L hip as well and is to have a L THA sometime in the fall or the beginning of th next year. She states she needs to lose 10 more lbs for the surgery to be scheduled.  PERTINENT HISTORY: High BMI, DM, knee pain  PAIN:  Are you having pain? Yes: NPRS scale: 4-8/10 Pain location: R knee Pain description: ache Aggravating factors: prolonged walking and standing, steps - 1 at a time up with the L Relieving factors: pain meds, lidocaine  cream  PRECAUTIONS: None  RED FLAGS: None   WEIGHT BEARING RESTRICTIONS: No  FALLS:  Has patient fallen in last 6 months? No A few near falls with the R knee partially giving out  LIVING ENVIRONMENT: Lives with: lives with their family Lives in: House/apartment Stairs: Yes: External: 2 steps; bilateral but cannot reach both Has following equipment at home: Single point cane  OCCUPATION: Disability  PLOF: Independent with community mobility with device  PATIENT GOALS: Strengthening of her R leg  NEXT MD VISIT: Not scheduled  OBJECTIVE:  Note: Objective measures were completed at Evaluation unless otherwise noted.  DIAGNOSTIC FINDINGS: X-rays demonstrate severe osteoarthritis.  Bone-on-bone joint space  narrowing.   PATIENT SURVEYS:  LEFS: 35/80= 44%  COGNITION: Overall cognitive status: Within functional limits for tasks assessed     SENSATION: WFL  EDEMA:  Swelling of the R knee palapated  MUSCLE LENGTH: Hamstrings: Right tight deg; Left tight deg Thomas test: Right tight deg; Left tight deg  POSTURE: increased lumbar lordosis and anterior pelvic tilt  PALPATION: TTP to the lateral joint space  LOWER EXTREMITY ROM:  Active ROM Right eval  Left eval  Hip flexion    Hip extension    Hip abduction    Hip adduction    Hip internal rotation    Hip external rotation    Knee flexion 95 110  Knee extension 0 0  Ankle dorsiflexion    Ankle plantarflexion    Ankle inversion    Ankle eversion     (Blank rows = not tested)  LOWER EXTREMITY MMT:  MMT Right eval Left eval  Hip flexion 3- 4  Hip extension 3- 3  Hip abduction 3- 4  Hip adduction    Hip internal rotation    Hip external rotation 3- 4  Knee flexion 3 4  Knee extension 3 4  Ankle dorsiflexion    Ankle plantarflexion  Ankle inversion    Ankle eversion     (Blank rows = not tested)  LOWER EXTREMITY SPECIAL TESTS:  Knee special tests: Patellafemoral apprehension test: positive   FUNCTIONAL TESTS:  5 times sit to stand: 40.6 2 minute walk test: TBA  GAIT: Distance walked: 200' Assistive device utilized: Single point cane Level of assistance: Modified independence Comments: Decreased pace                                                                                                                       TREATMENT DATE:  [facility] Adult PT Treatment:                                                DATE: 03/31/24 Therapeutic Exercise: Developed, instructed in, and pt completed therex as noted in HEP  Self Care: RICE for symptom management    PATIENT EDUCATION:  Education details: Eval findings, POC, HEP, self care  Person educated: Patient Education method: Explanation, Demonstration, Tactile cues, Verbal cues, and Handouts Education comprehension: verbalized understanding, returned demonstration, verbal cues required, and tactile cues required  HOME EXERCISE PROGRAM: Access Code: [email or web address] URL: [email or web address] Date: 03/31/2024 Prepared by: Liborio Reeds  Exercises - Supine Quad Set  - 2 x daily - 7 x weekly - 1 sets - 10 reps - 5 hold - Active Straight Leg Raise with Quad Set  - 2 x daily - 7 x weekly - 1 sets - 10 reps - 3  hold - Supine Bridge  - 2 x daily - 7 x weekly - 1 sets - 10 reps - 3 hold - Bridge with Arms at [employer] and Feet on [employer]  - 2 x daily - 7 x weekly - 1 sets - 10 reps - 3 hold - Hooklying Isometric Clamshell  - 2 x daily - 7 x weekly - 1 sets - 10 reps - 2 hold  ASSESSMENT:  CLINICAL IMPRESSION: Patient is a 63 y.o. female who was seen today for physical therapy evaluation and treatment for M17.11 (ICD-10-CM) - Primary osteoarthritis of right knee . Pt presents with decreased strength of the R LE, decreased R knee flexion, and decreased function as noted with the 5xSTS and her decreased pace for ambulation. A HEP was started to address deficits. Pt will benefit from skilled PT 2w8 to address impairments to optimize R knee/LE function with less pain.   OBJECTIVE IMPAIRMENTS: decreased balance, decreased mobility, difficulty walking, decreased ROM, decreased strength, obesity, and pain.   ACTIVITY LIMITATIONS: carrying, lifting, bending, sitting, standing, squatting, sleeping, stairs, bathing, dressing, hygiene/grooming, locomotion level, and caring for others  PARTICIPATION LIMITATIONS: meal prep, cleaning, laundry, shopping, and community activity  PERSONAL FACTORS: Fitness, Past/current experiences, Time since onset of injury/illness/exacerbation, and 1-2 comorbidities: high BMI, DM are also affecting patient's functional outcome.   REHAB POTENTIAL: Good  CLINICAL DECISION  MAKING: Stable/uncomplicated  EVALUATION COMPLEXITY: Low   GOALS:  SHORT TERM GOALS: Target date: 04/21/24 Pt will be Ind in an initial HEP  Baseline:started Goal status: INITIAL  2.  Assess Baseline:  Goal status: INITIAL   LONG TERM GOALS: Target date: 06/02/24  Pt will be Ind in a final HEP to maintain achieved LOF  Baseline:  Goal status: INITIAL  2.  Increase R knee flexion to 105d for improved function with sitting and steps Baseline: 95d Goal status: INITIAL  3.  Increase R knee and hip  strength to 3+ or greater for improved functional use of the R LE Baseline: 3- to 3 Goal status: INITIAL  4.  Improve 5xSTS by MCID of 5 and by MCID of 25ft as indication of improved functional mobility  Baseline: 40.6' c armrests, TBA Goal status: INITIAL  5.  Pt's LEFS score will improvve by the MCID to 54%% as indication of improved function  Baseline: 44% Goal status: INITIAL  6.  Pt will report 25% or greater improvement in her R knee pain for improved function and QOL Baseline: 4-8/10 Goal status: INITIAL   PLAN:  PT FREQUENCY: 2x/week  PT DURATION: 8 weeks  PLANNED INTERVENTIONS: 97164- PT Re-evaluation, 97110-Therapeutic exercises, 97530- Therapeutic activity, 97112- Neuromuscular re-education, 97535- Self Care, 16109- Manual therapy, [id number]- Gait training, [phone]- Aquatic Therapy, [phone]- Vasopneumatic device, [phone]- Ionotophoresis 4mg /ml Dexamethasone, 29562 (1-2 muscles), 20561 (3+ muscles)- Dry Needling, Patient/Family education, Balance training, Stair training, Taping, Joint mobilization, Cryotherapy, and Moist heat  PLAN FOR NEXT SESSION: Assess ; assess response to HEP; progress therex as indicated; use of modalities, manual therapy; and TPDN as indicated.  Christophe Rising MS, PT 03/31/24 10:17 AM   Liborio Reeds MS, PT 03/31/24 3:00 PM

## 2024-04-03 ENCOUNTER — Encounter: Payer: Self-pay | Admitting: Physical Therapy

## 2024-04-03 ENCOUNTER — Ambulatory Visit: Admitting: Physical Therapy

## 2024-04-03 DIAGNOSIS — M6281 Muscle weakness (generalized): Secondary | ICD-10-CM

## 2024-04-03 DIAGNOSIS — R262 Difficulty in walking, not elsewhere classified: Secondary | ICD-10-CM

## 2024-04-03 DIAGNOSIS — M25561 Pain in right knee: Secondary | ICD-10-CM | POA: Diagnosis not present

## 2024-04-03 DIAGNOSIS — G8929 Other chronic pain: Secondary | ICD-10-CM

## 2024-04-03 NOTE — Therapy (Signed)
 OUTPATIENT PHYSICAL THERAPY LOWER EXTREMITY TREATMENT   Patient Name: [name] MRN: [id number] DOB:07-Mar-1961, 63 y.o., female Today's Date: 04/03/2024  END OF SESSION:  PT End of Session - 04/03/24 1020     Visit Number 2    Number of Visits 17    Date for PT Re-Evaluation 06/02/24    Authorization Type [place] [employer]    PT Start Time 1018    PT Stop Time 1056    PT Time Calculation (min) 38 min          Past Medical History:  Diagnosis Date   Anemia    Anxiety    Arthritis    Back pain    Chest pain    Diabetes mellitus without complication (HCC)    recent dx diet controlled   DVT (deep venous thrombosis) (HCC)    remote - took coumadin    Dysmenorrhea    Dysrhythmia    irregular heart beat   Edema    Fatty liver    Fibroid    GERD (gastroesophageal reflux disease)    hx of   Gout    Hypercholesteremia    Hypertension 4 months   Hypothyroid    Joint pain    Menometrorrhagia    Morbid obesity (HCC)    Osteoarthritis    Palpitations    Prediabetes    Substance abuse (HCC)    Vitamin D  deficiency    Past Surgical History:  Procedure Laterality Date   ABDOMINAL HYSTERECTOMY  2008   BREAST BIOPSY Left 2013   benign   BUNIONECTOMY Bilateral    CHOLECYSTECTOMY     COLONOSCOPY WITH PROPOFOL  N/A 10/27/2018   Procedure: COLONOSCOPY WITH PROPOFOL ;  Surgeon: Lennard Lesta FALCON, MD;  Location: [facility];  Service: Endoscopy;  Laterality: N/A;   ECTOPIC PREGNANCY SURGERY  1980's   MANDIBLE OSTEOTOMY Bilateral 07/03/2013   Procedure: BILATERAL TORI;  Surgeon: Glendia CHRISTELLA Primrose, DDS;  Location: [facility];  Service: Oral Surgery;  Laterality: Bilateral;   NM MYOCAR PERF WALL MOTION  07/2012   lexiscan  - normal pattern of perfusion, low risk   ROTATOR CUFF REPAIR Bilateral 2009   SLEEP STUDY  07/27/2012   AHI 4.8/hr   TOOTH EXTRACTION N/A 07/03/2013   Procedure: DENTAL EXTRACTION # 20;  Surgeon: Glendia CHRISTELLA Primrose, DDS;  Location: [facility];   Service: Oral Surgery;  Laterality: N/A;   TRANSTHORACIC ECHOCARDIOGRAM  07/2012   EF=>55%, mod conc LVH; LA mod dilated; trace MR; mild TR with normal RVSP; trace AV regurg   TUBAL LIGATION     Patient Active Problem List   Diagnosis Date Noted   Prediabetes 02/22/2023   Primary osteoarthritis of both hips 12/15/2022   Central adiposity 10/20/2022   Vitamin D  deficiency 08/11/2022   Other fatigue 07/29/2022   SOBOE (shortness of breath on exertion) 07/29/2022   Osteoarthritis of left hip 07/29/2022   Other hyperlipidemia 07/29/2022   Depression screening 07/29/2022   Paresthesia 06/30/2022   Peripheral neuropathy 05/13/2022   Pain in left wrist 04/24/2022   Carpal tunnel syndrome, bilateral 03/18/2022   Plantar fasciitis of right foot 09/03/2021   Pes planus 09/03/2021   Nuclear sclerotic cataract of both eyes 05/27/2021   Diabetes mellitus without complication (HCC) 05/27/2021   Vitreomacular adhesion of both eyes 05/27/2021   Antiphospholipid antibody positive 05/21/2021   Pain in left elbow 02/19/2021   Chronic left-sided low back pain with left-sided sciatica 07/22/2017   Thyroid  nodule 04/16/2017   Essential  hypertension 03/13/2014   Menometrorrhagia    Dysmenorrhea    Anemia    Hyperlipidemia 05/23/2007   OBESITY, MORBID with starting BMI 45 05/23/2007   DYSFUNCTIONAL UTERINE BLEEDING - s/p LAVH 05/23/2007   ROTATOR CUFF SYNDROME 05/23/2007   KNEE PAIN, HX OF 05/23/2007    PCP: Joshua Francisco, MD  REFERRING PROVIDER: Jerri Kay HERO, MD  REFERRING DIAG: M17.11 (ICD-10-CM) - Primary osteoarthritis of right knee   THERAPY DIAG:  Chronic pain of right knee  Muscle weakness (generalized)  Difficulty in walking, not elsewhere classified  Rationale for Evaluation and Treatment: Rehabilitation  ONSET DATE: 3 weeks ago  SUBJECTIVE:   SUBJECTIVE STATEMENT: Pt reports less pain today and compliance with HEP.   EVAL: Pt reports about 3 weeks ago she was walking  fast and her R knee cap slipped out of place to the outside and she needed to walk straight legged. She experienced significant R knee pain at that time. She was told by Dr. Jerri she has bone on bone OA. Pt notes she has issues with her L hip as well and is to have a L THA sometime in the fall or the beginning of th next year. She states she needs to lose 10 more lbs for the surgery to be scheduled.  PERTINENT HISTORY: High BMI, DM, knee pain  PAIN:  Are you having pain? Yes: NPRS scale: 2/10 Pain location: R knee Pain description: ache Aggravating factors: prolonged walking and standing, steps - 1 at a time up with the L Relieving factors: pain meds, lidocaine  cream  PRECAUTIONS: None  RED FLAGS: None   WEIGHT BEARING RESTRICTIONS: No  FALLS:  Has patient fallen in last 6 months? No A few near falls with the R knee partially giving out  LIVING ENVIRONMENT: Lives with: lives with their family Lives in: House/apartment Stairs: Yes: External: 2 steps; bilateral but cannot reach both Has following equipment at home: Single point cane  OCCUPATION: Disability  PLOF: Independent with community mobility with device  PATIENT GOALS: Strengthening of her R leg  NEXT MD VISIT: Not scheduled  OBJECTIVE:  Note: Objective measures were completed at Evaluation unless otherwise noted.  DIAGNOSTIC FINDINGS: X-rays demonstrate severe osteoarthritis.  Bone-on-bone joint space  narrowing.   PATIENT SURVEYS:  LEFS: 35/80= 44%  COGNITION: Overall cognitive status: Within functional limits for tasks assessed     SENSATION: WFL  EDEMA:  Swelling of the R knee palapated  MUSCLE LENGTH: Hamstrings: Right tight deg; Left tight deg Thomas test: Right tight deg; Left tight deg  POSTURE: increased lumbar lordosis and anterior pelvic tilt  PALPATION: TTP to the lateral joint space  LOWER EXTREMITY ROM:  Active ROM Right eval Left eval Right 04/03/24  Hip flexion     Hip extension      Hip abduction     Hip adduction     Hip internal rotation     Hip external rotation     Knee flexion 95 110 102  Knee extension 0 0   Ankle dorsiflexion     Ankle plantarflexion     Ankle inversion     Ankle eversion      (Blank rows = not tested)  LOWER EXTREMITY MMT:  MMT Right eval Left eval  Hip flexion 3- 4  Hip extension 3- 3  Hip abduction 3- 4  Hip adduction    Hip internal rotation    Hip external rotation 3- 4  Knee flexion 3 4  Knee extension 3  4  Ankle dorsiflexion    Ankle plantarflexion    Ankle inversion    Ankle eversion     (Blank rows = not tested)  LOWER EXTREMITY SPECIAL TESTS:  Knee special tests: Patellafemoral apprehension test: positive   FUNCTIONAL TESTS:  5 times sit to stand: 40.6 2 minute walk test: TBA  04/03/24: 287 feet 2 MWT  GAIT: Distance walked: 200' Assistive device utilized: Single point cane Level of assistance: Modified independence Comments: Decreased pace                                                                                                                       TREATMENT DATE:  [facility] Adult PT Treatment:                                                DATE: 04/03/24  LAQ AROM  Seated heel slide AAROM with cloth Supine QS x 10 SLR  x 10 Heel slide using sliding board x 10  Bridge 10 x 2  Clam Blue 10 x 2 , supine  Bridge with feet over ball x 10 H/s curls with feet on ball x 10     [facility] Adult PT Treatment:                                                DATE: 03/31/24 Therapeutic Exercise: Developed, instructed in, and pt completed therex as noted in HEP  Self Care: RICE for symptom management    PATIENT EDUCATION:  Education details: Eval findings, POC, HEP, self care  Person educated: Patient Education method: Explanation, Demonstration, Tactile cues, Verbal cues, and Handouts Education comprehension: verbalized understanding, returned demonstration, verbal cues required, and tactile cues  required  HOME EXERCISE PROGRAM: Access Code: [email or web address] URL: [email or web address] Date: 03/31/2024 Prepared by: Dasie Daft  Exercises - Supine Quad Set  - 2 x daily - 7 x weekly - 1 sets - 10 reps - 5 hold - Active Straight Leg Raise with Quad Set  - 2 x daily - 7 x weekly - 1 sets - 10 reps - 3 hold - Supine Bridge  - 2 x daily - 7 x weekly - 1 sets - 10 reps - 3 hold - Bridge with Arms at [employer] and Feet on [employer]  - 2 x daily - 7 x weekly - 1 sets - 10 reps - 3 hold - Hooklying Isometric Clamshell  - 2 x daily - 7 x weekly - 1 sets - 10 reps - 2 hold  ASSESSMENT:  CLINICAL IMPRESSION: Pt reports her knee is better than a few weeks ago. 2/10 pain. She reports compliance with HEP. Captured 2 MWT baseline and reviewed HEP. Continued with quad activation  and flexion ROM. She measures improved knee flexion from 95 d to 102 d.     EVAL: Patient is a 63 y.o. female who was seen today for physical therapy evaluation and treatment for M17.11 (ICD-10-CM) - Primary osteoarthritis of right knee . Pt presents with decreased strength of the R LE, decreased R knee flexion, and decreased function as noted with the 5xSTS and her decreased pace for ambulation. A HEP was started to address deficits. Pt will benefit from skilled PT 2w8 to address impairments to optimize R knee/LE function with less pain.   OBJECTIVE IMPAIRMENTS: decreased balance, decreased mobility, difficulty walking, decreased ROM, decreased strength, obesity, and pain.   ACTIVITY LIMITATIONS: carrying, lifting, bending, sitting, standing, squatting, sleeping, stairs, bathing, dressing, hygiene/grooming, locomotion level, and caring for others  PARTICIPATION LIMITATIONS: meal prep, cleaning, laundry, shopping, and community activity  PERSONAL FACTORS: Fitness, Past/current experiences, Time since onset of injury/illness/exacerbation, and 1-2 comorbidities: high BMI, DM are also affecting patient's functional  outcome.   REHAB POTENTIAL: Good  CLINICAL DECISION MAKING: Stable/uncomplicated  EVALUATION COMPLEXITY: Low   GOALS:  SHORT TERM GOALS: Target date: 04/21/24 Pt will be Ind in an initial HEP  Baseline:started Goal status: INITIAL  2.  Assess Baseline: 287 feet Goal status: MET    LONG TERM GOALS: Target date: 06/02/24  Pt will be Ind in a final HEP to maintain achieved LOF  Baseline:  Goal status: INITIAL  2.  Increase R knee flexion to 105d for improved function with sitting and steps Baseline: 95d Goal status: INITIAL  3.  Increase R knee and hip strength to 3+ or greater for improved functional use of the R LE Baseline: 3- to 3 Goal status: INITIAL  4.  Improve 5xSTS by MCID of 5 and by MCID of 32ft as indication of improved functional mobility  Baseline: 40.6' c armrests, TBA 04/03/24: 2 MWT 287 feet  Goal status: ONGOING   5.  Pt's LEFS score will improvve by the MCID to 54%% as indication of improved function  Baseline: 44% Goal status: INITIAL  6.  Pt will report 25% or greater improvement in her R knee pain for improved function and QOL Baseline: 4-8/10 Goal status: INITIAL   PLAN:  PT FREQUENCY: 2x/week  PT DURATION: 8 weeks  PLANNED INTERVENTIONS: 97164- PT Re-evaluation, 97110-Therapeutic exercises, 97530- Therapeutic activity, 97112- Neuromuscular re-education, 97535- Self Care, 02859- Manual therapy, [id number]- Gait training, [phone]- Aquatic Therapy, [phone]- Vasopneumatic device, [phone]- Ionotophoresis 4mg /ml Dexamethasone, 79439 (1-2 muscles), 20561 (3+ muscles)- Dry Needling, Patient/Family education, Balance training, Stair training, Taping, Joint mobilization, Cryotherapy, and Moist heat  PLAN FOR NEXT SESSION:  assess response to HEP; progress therex as indicated; use of modalities, manual therapy; and TPDN as indicated.  Harlene Persons, PTA 04/03/24 11:00 AM Phone: [phone] Fax: [phone]

## 2024-04-04 ENCOUNTER — Encounter

## 2024-04-11 NOTE — Therapy (Signed)
 OUTPATIENT PHYSICAL THERAPY LOWER EXTREMITY TREATMENT   Patient Name: [name] MRN: [id number] DOB:1961-10-03, 63 y.o., female Today's Date: 04/12/2024  END OF SESSION:  PT End of Session - 04/12/24 1019     Visit Number 3    Number of Visits 17    Date for PT Re-Evaluation 06/02/24    Authorization Type [place] MEDICAID [employer]    PT Start Time 1018    PT Stop Time 1100    PT Time Calculation (min) 42 min    Activity Tolerance Patient tolerated treatment well    Behavior During Therapy WFL for tasks assessed/performed           Past Medical History:  Diagnosis Date   Anemia    Anxiety    Arthritis    Back pain    Chest pain    Diabetes mellitus without complication (HCC)    recent dx diet controlled   DVT (deep venous thrombosis) (HCC)    remote - took coumadin    Dysmenorrhea    Dysrhythmia    irregular heart beat   Edema    Fatty liver    Fibroid    GERD (gastroesophageal reflux disease)    hx of   Gout    Hypercholesteremia    Hypertension 4 months   Hypothyroid    Joint pain    Menometrorrhagia    Morbid obesity (HCC)    Osteoarthritis    Palpitations    Prediabetes    Substance abuse (HCC)    Vitamin D  deficiency    Past Surgical History:  Procedure Laterality Date   ABDOMINAL HYSTERECTOMY  2008   BREAST BIOPSY Left 2013   benign   BUNIONECTOMY Bilateral    CHOLECYSTECTOMY     COLONOSCOPY WITH PROPOFOL  N/A 10/27/2018   Procedure: COLONOSCOPY WITH PROPOFOL ;  Surgeon: Lennard Lesta FALCON, MD;  Location: [facility];  Service: Endoscopy;  Laterality: N/A;   ECTOPIC PREGNANCY SURGERY  1980's   MANDIBLE OSTEOTOMY Bilateral 07/03/2013   Procedure: BILATERAL TORI;  Surgeon: Glendia CHRISTELLA Primrose, DDS;  Location: [facility];  Service: Oral Surgery;  Laterality: Bilateral;   NM MYOCAR PERF WALL MOTION  07/2012   lexiscan  - normal pattern of perfusion, low risk   ROTATOR CUFF REPAIR Bilateral 2009   SLEEP STUDY  07/27/2012   AHI 4.8/hr    TOOTH EXTRACTION N/A 07/03/2013   Procedure: DENTAL EXTRACTION # 20;  Surgeon: Glendia CHRISTELLA Primrose, DDS;  Location: [facility];  Service: Oral Surgery;  Laterality: N/A;   TRANSTHORACIC ECHOCARDIOGRAM  07/2012   EF=>55%, mod conc LVH; LA mod dilated; trace MR; mild TR with normal RVSP; trace AV regurg   TUBAL LIGATION     Patient Active Problem List   Diagnosis Date Noted   Prediabetes 02/22/2023   Primary osteoarthritis of both hips 12/15/2022   Central adiposity 10/20/2022   Vitamin D  deficiency 08/11/2022   Other fatigue 07/29/2022   SOBOE (shortness of breath on exertion) 07/29/2022   Osteoarthritis of left hip 07/29/2022   Other hyperlipidemia 07/29/2022   Depression screening 07/29/2022   Paresthesia 06/30/2022   Peripheral neuropathy 05/13/2022   Pain in left wrist 04/24/2022   Carpal tunnel syndrome, bilateral 03/18/2022   Plantar fasciitis of right foot 09/03/2021   Pes planus 09/03/2021   Nuclear sclerotic cataract of both eyes 05/27/2021   Diabetes mellitus without complication (HCC) 05/27/2021   Vitreomacular adhesion of both eyes 05/27/2021   Antiphospholipid antibody positive 05/21/2021   Pain in left elbow  02/19/2021   Chronic left-sided low back pain with left-sided sciatica 07/22/2017   Thyroid  nodule 04/16/2017   Essential hypertension 03/13/2014   Menometrorrhagia    Dysmenorrhea    Anemia    Hyperlipidemia 05/23/2007   OBESITY, MORBID with starting BMI 45 05/23/2007   DYSFUNCTIONAL UTERINE BLEEDING - s/p LAVH 05/23/2007   ROTATOR CUFF SYNDROME 05/23/2007   KNEE PAIN, HX OF 05/23/2007    PCP: Joshua Francisco, MD  REFERRING PROVIDER: Jerri Kay HERO, MD  REFERRING DIAG: M17.11 (ICD-10-CM) - Primary osteoarthritis of right knee   THERAPY DIAG:  Chronic pain of right knee  Muscle weakness (generalized)  Difficulty in walking, not elsewhere classified  Rationale for Evaluation and Treatment: Rehabilitation  ONSET DATE: 3 weeks ago  SUBJECTIVE:   SUBJECTIVE  STATEMENT: Pt reports overall her R knee is doing better. She experiences knee stiffness most mornings. She is completing her HEP 1 to 2x daily.  EVAL: Pt reports about 3 weeks ago she was walking fast and her R knee cap slipped out of place to the outside and she needed to walk straight legged. She experienced significant R knee pain at that time. She was told by Dr. Jerri she has bone on bone OA. Pt notes she has issues with her L hip as well and is to have a L THA sometime in the fall or the beginning of th next year. She states she needs to lose 10 more lbs for the surgery to be scheduled.  PERTINENT HISTORY: High BMI, DM, knee pain  PAIN:  Are you having pain? Yes: NPRS scale: 1/10 Pain location: R knee Pain description: ache Aggravating factors: prolonged walking and standing, steps - 1 at a time up with the L Relieving factors: pain meds, lidocaine  cream  PRECAUTIONS: None  RED FLAGS: None   WEIGHT BEARING RESTRICTIONS: No  FALLS:  Has patient fallen in last 6 months? No A few near falls with the R knee partially giving out  LIVING ENVIRONMENT: Lives with: lives with their family Lives in: House/apartment Stairs: Yes: External: 2 steps; bilateral but cannot reach both Has following equipment at home: Single point cane  OCCUPATION: Disability  PLOF: Independent with community mobility with device  PATIENT GOALS: Strengthening of her R leg  NEXT MD VISIT: Not scheduled  OBJECTIVE:  Note: Objective measures were completed at Evaluation unless otherwise noted.  DIAGNOSTIC FINDINGS: X-rays demonstrate severe osteoarthritis.  Bone-on-bone joint space  narrowing.   PATIENT SURVEYS:  LEFS: 35/80= 44%  COGNITION: Overall cognitive status: Within functional limits for tasks assessed     SENSATION: WFL  EDEMA:  Swelling of the R knee palapated  MUSCLE LENGTH: Hamstrings: Right tight deg; Left tight deg Thomas test: Right tight deg; Left tight deg  POSTURE:  increased lumbar lordosis and anterior pelvic tilt  PALPATION: TTP to the lateral joint space  LOWER EXTREMITY ROM:  Active ROM Right eval Left eval Right 04/03/24 RT 04/12/24  Hip flexion      Hip extension      Hip abduction      Hip adduction      Hip internal rotation      Hip external rotation      Knee flexion 95 110 102 110d  Knee extension 0 0    Ankle dorsiflexion      Ankle plantarflexion      Ankle inversion      Ankle eversion       (Blank rows = not tested)  LOWER EXTREMITY  MMT:  MMT Right eval Left eval  Hip flexion 3- 4  Hip extension 3- 3  Hip abduction 3- 4  Hip adduction    Hip internal rotation    Hip external rotation 3- 4  Knee flexion 3 4  Knee extension 3 4  Ankle dorsiflexion    Ankle plantarflexion    Ankle inversion    Ankle eversion     (Blank rows = not tested)  LOWER EXTREMITY SPECIAL TESTS:  Knee special tests: Patellafemoral apprehension test: positive   FUNCTIONAL TESTS:  5 times sit to stand: 40.6 2 minute walk test: TBA  04/03/24: 287 feet 2 MWT  GAIT: Distance walked: 200' Assistive device utilized: Single point cane Level of assistance: Modified independence Comments: Decreased pace                                                                                                                       TREATMENT DATE:  [facility] Adult PT Treatment:                                                DATE: 04/12/24 Therapeutic Exercise/Activity: Seated heel slide AAROM with cloth Supine QS x 10 SLR  x 10 Heel slide using sliding board x 10  Bridge 10 x 2  3 Clam Blue 10 x 2 , supine  H/s curls with feet on ball x 10  LAQ AROM 2x10 STS mat table 2x5  [facility] Adult PT Treatment:                                                DATE: 04/03/24  LAQ AROM  Seated heel slide AAROM with cloth Supine QS x 10 SLR  x 10 Heel slide using sliding board x 10  Bridge 10 x 2  Clam Blue 10 x 2 , supine  Bridge with feet over ball x 10 H/s  curls with feet on ball x 10   [facility] Adult PT Treatment:                                                DATE: 03/31/24 Therapeutic Exercise: Developed, instructed in, and pt completed therex as noted in HEP  Self Care: RICE for symptom management    PATIENT EDUCATION:  Education details: Eval findings, POC, HEP, self care  Person educated: Patient Education method: Explanation, Demonstration, Tactile cues, Verbal cues, and Handouts Education comprehension: verbalized understanding, returned demonstration, verbal cues required, and tactile cues required  HOME EXERCISE PROGRAM: Access Code: [email or web address] URL: [email or web address] Date: 04/12/2024 Prepared by: Dasie Daft  Exercises -  Supine Quad Set  - 2 x daily - 7 x weekly - 1 sets - 10 reps - 5 hold - Active Straight Leg Raise with Quad Set  - 2 x daily - 7 x weekly - 1 sets - 10 reps - 3 hold - Supine Bridge  - 2 x daily - 7 x weekly - 1 sets - 10 reps - 3 hold - Bridge with Arms at [employer] and Feet on [employer]  - 2 x daily - 7 x weekly - 1 sets - 10 reps - 3 hold - Hooklying Isometric Clamshell  - 2 x daily - 7 x weekly - 1 sets - 10 reps - 2 hold - Seated Long Arc Quad  - 1 x daily - 7 x weekly - 1 sets - 10 reps - 3 hold - Sit to Stand Without Arm Support  - 1 x daily - 7 x weekly - 1 sets - 10 reps  ASSESSMENT:  CLINICAL IMPRESSION: PT was completed for R knee/LE strengthening with a gradual progression in demand. Pt is responding well to PT intervention with reported improved pain and with her knee flexion ROM continuing to improve. Pt will continue to benefit from skilled PT to address impairments for improved function with minimized pain.  EVAL: Patient is a 63 y.o. female who was seen today for physical therapy evaluation and treatment for M17.11 (ICD-10-CM) - Primary osteoarthritis of right knee . Pt presents with decreased strength of the R LE, decreased R knee flexion, and decreased function as noted with the  5xSTS and her decreased pace for ambulation. A HEP was started to address deficits. Pt will benefit from skilled PT 2w8 to address impairments to optimize R knee/LE function with less pain.   OBJECTIVE IMPAIRMENTS: decreased balance, decreased mobility, difficulty walking, decreased ROM, decreased strength, obesity, and pain.   ACTIVITY LIMITATIONS: carrying, lifting, bending, sitting, standing, squatting, sleeping, stairs, bathing, dressing, hygiene/grooming, locomotion level, and caring for others  PARTICIPATION LIMITATIONS: meal prep, cleaning, laundry, shopping, and community activity  PERSONAL FACTORS: Fitness, Past/current experiences, Time since onset of injury/illness/exacerbation, and 1-2 comorbidities: high BMI, DM are also affecting patient's functional outcome.   REHAB POTENTIAL: Good  CLINICAL DECISION MAKING: Stable/uncomplicated  EVALUATION COMPLEXITY: Low   GOALS:  SHORT TERM GOALS: Target date: 04/21/24 Pt will be Ind in an initial HEP  Baseline:started Goal status: INITIAL  2.  Assess Baseline: 287 feet Goal status: MET    LONG TERM GOALS: Target date: 06/02/24  Pt will be Ind in a final HEP to maintain achieved LOF  Baseline:  Goal status: INITIAL  2.  Increase R knee flexion to 105d for improved function with sitting and steps Baseline: 95d Goal status: INITIAL  3.  Increase R knee and hip strength to 3+ or greater for improved functional use of the R LE Baseline: 3- to 3 Goal status: INITIAL  4.  Improve 5xSTS by MCID of 5 and by MCID of 59ft as indication of improved functional mobility  Baseline: 40.6' c armrests, TBA 04/03/24: 2 MWT 287 feet  Goal status: ONGOING   5.  Pt's LEFS score will improvve by the MCID to 54%% as indication of improved function  Baseline: 44% Goal status: INITIAL  6.  Pt will report 25% or greater improvement in her R knee pain for improved function and QOL Baseline: 4-8/10 Goal status:  INITIAL   PLAN:  PT FREQUENCY: 2x/week  PT DURATION: 8 weeks  PLANNED INTERVENTIONS:  02835- PT Re-evaluation, 97110-Therapeutic exercises, 97530- Therapeutic activity, [id number]- Neuromuscular re-education, [phone]- Self Care, [id number]- Manual therapy, [phone]- Gait training, [phone]- Aquatic Therapy, [phone]- Vasopneumatic device, [phone]- Ionotophoresis 4mg /ml Dexamethasone, 20560 (1-2 muscles), 20561 (3+ muscles)- Dry Needling, Patient/Family education, Balance training, Stair training, Taping, Joint mobilization, Cryotherapy, and Moist heat  PLAN FOR NEXT SESSION:  assess response to HEP; progress therex as indicated; use of modalities, manual therapy; and TPDN as indicated.  Raequan Vanschaick MS, PT 04/12/24 10:58 AM

## 2024-04-12 ENCOUNTER — Ambulatory Visit: Attending: Orthopaedic Surgery

## 2024-04-12 DIAGNOSIS — M25561 Pain in right knee: Secondary | ICD-10-CM | POA: Insufficient documentation

## 2024-04-12 DIAGNOSIS — R262 Difficulty in walking, not elsewhere classified: Secondary | ICD-10-CM | POA: Diagnosis present

## 2024-04-12 DIAGNOSIS — G8929 Other chronic pain: Secondary | ICD-10-CM | POA: Diagnosis present

## 2024-04-12 DIAGNOSIS — M6281 Muscle weakness (generalized): Secondary | ICD-10-CM | POA: Diagnosis present

## 2024-04-12 NOTE — Therapy (Signed)
 OUTPATIENT PHYSICAL THERAPY LOWER EXTREMITY TREATMENT   Patient Name: [name] MRN: [id number] DOB:1961/08/08, 63 y.o., female Today's Date: 04/13/2024  END OF SESSION:  PT End of Session - 04/13/24 0951     Visit Number 4    Number of Visits 17    Date for PT Re-Evaluation 06/02/24    Authorization Type [place] [employer]ICA[employer]    PT Start Time 0940    PT Stop Time 1018    PT Time Calculation (min) 38 min    Activity Tolerance Patient tolerated treatment well    Behavior During Therapy WFL for tasks assessed/performed            Past Medical History:  Diagnosis Date   Anemia    Anxiety    Arthritis    Back pain    Chest pain    Diabetes mellitus without complication (HCC)    recent dx diet controlled   DVT (deep venous thrombosis) (HCC)    remote - took coumadin    Dysmenorrhea    Dysrhythmia    irregular heart beat   Edema    Fatty liver    Fibroid    GERD (gastroesophageal reflux disease)    hx of   Gout    Hypercholesteremia    Hypertension 4 months   Hypothyroid    Joint pain    Menometrorrhagia    Morbid obesity (HCC)    Osteoarthritis    Palpitations    Prediabetes    Substance abuse (HCC)    Vitamin D  deficiency    Past Surgical History:  Procedure Laterality Date   ABDOMINAL HYSTERECTOMY  2008   BREAST BIOPSY Left 2013   benign   BUNIONECTOMY Bilateral    CHOLECYSTECTOMY     COLONOSCOPY WITH PROPOFOL  N/A 10/27/2018   Procedure: COLONOSCOPY WITH PROPOFOL ;  Surgeon: Lennard Lesta FALCON, MD;  Location: [facility];  Service: Endoscopy;  Laterality: N/A;   ECTOPIC PREGNANCY SURGERY  1980's   MANDIBLE OSTEOTOMY Bilateral 07/03/2013   Procedure: BILATERAL TORI;  Surgeon: Glendia CHRISTELLA Primrose, DDS;  Location: [facility];  Service: Oral Surgery;  Laterality: Bilateral;   NM MYOCAR PERF WALL MOTION  07/2012   lexiscan  - normal pattern of perfusion, low risk   ROTATOR CUFF REPAIR Bilateral 2009   SLEEP STUDY  07/27/2012   AHI 4.8/hr    TOOTH EXTRACTION N/A 07/03/2013   Procedure: DENTAL EXTRACTION # 20;  Surgeon: Glendia CHRISTELLA Primrose, DDS;  Location: [facility];  Service: Oral Surgery;  Laterality: N/A;   TRANSTHORACIC ECHOCARDIOGRAM  07/2012   EF=>55%, mod conc LVH; LA mod dilated; trace MR; mild TR with normal RVSP; trace AV regurg   TUBAL LIGATION     Patient Active Problem List   Diagnosis Date Noted   Prediabetes 02/22/2023   Primary osteoarthritis of both hips 12/15/2022   Central adiposity 10/20/2022   Vitamin D  deficiency 08/11/2022   Other fatigue 07/29/2022   SOBOE (shortness of breath on exertion) 07/29/2022   Osteoarthritis of left hip 07/29/2022   Other hyperlipidemia 07/29/2022   Depression screening 07/29/2022   Paresthesia 06/30/2022   Peripheral neuropathy 05/13/2022   Pain in left wrist 04/24/2022   Carpal tunnel syndrome, bilateral 03/18/2022   Plantar fasciitis of right foot 09/03/2021   Pes planus 09/03/2021   Nuclear sclerotic cataract of both eyes 05/27/2021   Diabetes mellitus without complication (HCC) 05/27/2021   Vitreomacular adhesion of both eyes 05/27/2021   Antiphospholipid antibody positive 05/21/2021   Pain in left  elbow 02/19/2021   Chronic left-sided low back pain with left-sided sciatica 07/22/2017   Thyroid  nodule 04/16/2017   Essential hypertension 03/13/2014   Menometrorrhagia    Dysmenorrhea    Anemia    Hyperlipidemia 05/23/2007   OBESITY, MORBID with starting BMI 45 05/23/2007   DYSFUNCTIONAL UTERINE BLEEDING - s/p LAVH 05/23/2007   ROTATOR CUFF SYNDROME 05/23/2007   KNEE PAIN, HX OF 05/23/2007    PCP: Joshua Francisco, MD  REFERRING PROVIDER: Jerri Kay HERO, MD  REFERRING DIAG: M17.11 (ICD-10-CM) - Primary osteoarthritis of right knee   THERAPY DIAG:  Chronic pain of right knee  Muscle weakness (generalized)  Difficulty in walking, not elsewhere classified  Rationale for Evaluation and Treatment: Rehabilitation  ONSET DATE: 3 weeks ago  SUBJECTIVE:   SUBJECTIVE  STATEMENT: Pt reports tolerating yesterday PT session well.  EVAL: Pt reports about 3 weeks ago she was walking fast and her R knee cap slipped out of place to the outside and she needed to walk straight legged. She experienced significant R knee pain at that time. She was told by Dr. Jerri she has bone on bone OA. Pt notes she has issues with her L hip as well and is to have a L THA sometime in the fall or the beginning of th next year. She states she needs to lose 10 more lbs for the surgery to be scheduled.  PERTINENT HISTORY: High BMI, DM, knee pain  PAIN:  Are you having pain? Yes: NPRS scale: 1/10 Pain location: R knee Pain description: ache Aggravating factors: prolonged walking and standing, steps - 1 at a time up with the L Relieving factors: pain meds, lidocaine  cream  PRECAUTIONS: None  RED FLAGS: None   WEIGHT BEARING RESTRICTIONS: No  FALLS:  Has patient fallen in last 6 months? No A few near falls with the R knee partially giving out  LIVING ENVIRONMENT: Lives with: lives with their family Lives in: House/apartment Stairs: Yes: External: 2 steps; bilateral but cannot reach both Has following equipment at home: Single point cane  OCCUPATION: Disability  PLOF: Independent with community mobility with device  PATIENT GOALS: Strengthening of her R leg  NEXT MD VISIT: Not scheduled  OBJECTIVE:  Note: Objective measures were completed at Evaluation unless otherwise noted.  DIAGNOSTIC FINDINGS: X-rays demonstrate severe osteoarthritis.  Bone-on-bone joint space  narrowing.   PATIENT SURVEYS:  LEFS: 35/80= 44%  COGNITION: Overall cognitive status: Within functional limits for tasks assessed     SENSATION: WFL  EDEMA:  Swelling of the R knee palapated  MUSCLE LENGTH: Hamstrings: Right tight deg; Left tight deg Thomas test: Right tight deg; Left tight deg  POSTURE: increased lumbar lordosis and anterior pelvic tilt  PALPATION: TTP to the lateral joint  space  LOWER EXTREMITY ROM:  Active ROM Right eval Left eval Right 04/03/24 RT 04/12/24  Hip flexion      Hip extension      Hip abduction      Hip adduction      Hip internal rotation      Hip external rotation      Knee flexion 95 110 102 110d  Knee extension 0 0    Ankle dorsiflexion      Ankle plantarflexion      Ankle inversion      Ankle eversion       (Blank rows = not tested)  LOWER EXTREMITY MMT:  MMT Right eval Left eval  Hip flexion 3- 4  Hip extension 3-  3  Hip abduction 3- 4  Hip adduction    Hip internal rotation    Hip external rotation 3- 4  Knee flexion 3 4  Knee extension 3 4  Ankle dorsiflexion    Ankle plantarflexion    Ankle inversion    Ankle eversion     (Blank rows = not tested)  LOWER EXTREMITY SPECIAL TESTS:  Knee special tests: Patellafemoral apprehension test: positive   FUNCTIONAL TESTS:  5 times sit to stand: 40.6 2 minute walk test: TBA  04/03/24: 287 feet 2 MWT  GAIT: Distance walked: 200' Assistive device utilized: Single point cane Level of assistance: Modified independence Comments: Decreased pace                                                                                                                       TREATMENT DATE:  [facility] Adult PT Treatment:                                                DATE: 04/13/24 Therapeutic Exercise/Activity: Seated heel slide AAROM with cloth Supine QS x 10 SLR  x 10 STS mat table 2x5 Tandem standing each foot back SL standing each Banded side stepping GTB at counter Standing leg curls 2x12 each Heel raises/toe lifts 2x10 Narrow stance on airex Marching on airex  [facility] Adult PT Treatment:                                                DATE: 04/12/24 Therapeutic Exercise/Activity: Seated heel slide AAROM with cloth Supine QS x 10 SLR  x 10 Heel slide using sliding board x 10  Bridge 10 x 2  3 Clam Blue 10 x 2 , supine  H/s curls with feet on ball x 10  LAQ AROM 2x10 STS mat  table 2x5  [facility] Adult PT Treatment:                                                DATE: 04/03/24  LAQ AROM  Seated heel slide AAROM with cloth Supine QS x 10 SLR  x 10 Heel slide using sliding board x 10  Bridge 10 x 2  Clam Blue 10 x 2 , supine  Bridge with feet over ball x 10 H/s curls with feet on ball x 10   [facility] Adult PT Treatment:  DATE: 03/31/24 Therapeutic Exercise: Developed, instructed in, and pt completed therex as noted in HEP  Self Care: RICE for symptom management    PATIENT EDUCATION:  Education details: Eval findings, POC, HEP, self care  Person educated: Patient Education method: Explanation, Demonstration, Tactile cues, Verbal cues, and Handouts Education comprehension: verbalized understanding, returned demonstration, verbal cues required, and tactile cues required  HOME EXERCISE PROGRAM: Access Code: [email or web address] URL: [email or web address] Date: 04/12/2024 Prepared by: Dasie Daft  Exercises - Supine Quad Set  - 2 x daily - 7 x weekly - 1 sets - 10 reps - 5 hold - Active Straight Leg Raise with Quad Set  - 2 x daily - 7 x weekly - 1 sets - 10 reps - 3 hold - Supine Bridge  - 2 x daily - 7 x weekly - 1 sets - 10 reps - 3 hold - Bridge with Arms at [employer] and Feet on [employer]  - 2 x daily - 7 x weekly - 1 sets - 10 reps - 3 hold - Hooklying Isometric Clamshell  - 2 x daily - 7 x weekly - 1 sets - 10 reps - 2 hold - Seated Long Arc Quad  - 1 x daily - 7 x weekly - 1 sets - 10 reps - 3 hold - Sit to Stand Without Arm Support  - 1 x daily - 7 x weekly - 1 sets - 10 reps  ASSESSMENT:  CLINICAL IMPRESSION: PT was completed for R knee/LE strengthening. Pt was progressed to more exs being completed in the [facility]. Pt reports understanding her HEP. Pt tolerated the prescribed exercises in PT today. Pt's R knee pain continues to be consistently better. Pt will continue to benefit from skilled PT to address  impairments for improved function with minimized pain.  EVAL: Patient is a 63 y.o. female who was seen today for physical therapy evaluation and treatment for M17.11 (ICD-10-CM) - Primary osteoarthritis of right knee . Pt presents with decreased strength of the R LE, decreased R knee flexion, and decreased function as noted with the 5xSTS and her decreased pace for ambulation. A HEP was started to address deficits. Pt will benefit from skilled PT 2w8 to address impairments to optimize R knee/LE function with less pain.   OBJECTIVE IMPAIRMENTS: decreased balance, decreased mobility, difficulty walking, decreased ROM, decreased strength, obesity, and pain.   ACTIVITY LIMITATIONS: carrying, lifting, bending, sitting, standing, squatting, sleeping, stairs, bathing, dressing, hygiene/grooming, locomotion level, and caring for others  PARTICIPATION LIMITATIONS: meal prep, cleaning, laundry, shopping, and community activity  PERSONAL FACTORS: Fitness, Past/current experiences, Time since onset of injury/illness/exacerbation, and 1-2 comorbidities: high BMI, DM are also affecting patient's functional outcome.   REHAB POTENTIAL: Good  CLINICAL DECISION MAKING: Stable/uncomplicated  EVALUATION COMPLEXITY: Low   GOALS:  SHORT TERM GOALS: Target date: 04/21/24 Pt will be Ind in an initial HEP  Baseline:started Goal status: ONGOING  2.  Assess Baseline: 287 feet Goal status: MET    LONG TERM GOALS: Target date: 06/02/24  Pt will be Ind in a final HEP to maintain achieved LOF  Baseline:  Goal status: INITIAL  2.  Increase R knee flexion to 105d for improved function with sitting and steps Baseline: 95d Goal status: INITIAL  3.  Increase R knee and hip strength to 3+ or greater for improved functional use of the R LE Baseline: 3- to 3 Goal status: INITIAL  4.  Improve 5xSTS by MCID of 5 and by MCID of 66ft  as indication of improved functional mobility  Baseline: 40.6' c  armrests, TBA 04/03/24: 2 MWT 287 feet  Goal status: ONGOING   5.  Pt's LEFS score will improvve by the MCID to 54%% as indication of improved function  Baseline: 44% Goal status: INITIAL  6.  Pt will report 25% or greater improvement in her R knee pain for improved function and QOL Baseline: 4-8/10 Goal status: INITIAL   PLAN:  PT FREQUENCY: 2x/week  PT DURATION: 8 weeks  PLANNED INTERVENTIONS: [id number]- PT Re-evaluation, 97110-Therapeutic exercises, 97530- Therapeutic activity, 97112- Neuromuscular re-education, 97535- Self Care, [id number]- Manual therapy, [id number]- Gait training, [phone]- Aquatic Therapy, [phone]- Vasopneumatic device, [phone]- Ionotophoresis 4mg /ml Dexamethasone, 79439 (1-2 muscles), [phone] (3+ muscles)- Dry Needling, Patient/Family education, Balance training, Stair training, Taping, Joint mobilization, Cryotherapy, and Moist heat  PLAN FOR NEXT SESSION:  assess response to HEP; progress therex as indicated; use of modalities, manual therapy; and TPDN as indicated.  Ivann Trimarco MS, PT 04/13/24 10:19 AM

## 2024-04-13 ENCOUNTER — Ambulatory Visit

## 2024-04-13 DIAGNOSIS — G8929 Other chronic pain: Secondary | ICD-10-CM

## 2024-04-13 DIAGNOSIS — R262 Difficulty in walking, not elsewhere classified: Secondary | ICD-10-CM

## 2024-04-13 DIAGNOSIS — M6281 Muscle weakness (generalized): Secondary | ICD-10-CM

## 2024-04-13 DIAGNOSIS — M25561 Pain in right knee: Secondary | ICD-10-CM | POA: Diagnosis not present

## 2024-04-18 ENCOUNTER — Encounter (INDEPENDENT_AMBULATORY_CARE_PROVIDER_SITE_OTHER): Payer: Self-pay | Admitting: Family Medicine

## 2024-04-18 ENCOUNTER — Ambulatory Visit (INDEPENDENT_AMBULATORY_CARE_PROVIDER_SITE_OTHER): Admitting: Family Medicine

## 2024-04-18 ENCOUNTER — Encounter (INDEPENDENT_AMBULATORY_CARE_PROVIDER_SITE_OTHER): Payer: Self-pay

## 2024-04-18 VITALS — BP 105/56 | HR 62 | Temp 98.2°F | Ht 66.0 in | Wt 256.0 lb

## 2024-04-18 DIAGNOSIS — E669 Obesity, unspecified: Secondary | ICD-10-CM

## 2024-04-18 DIAGNOSIS — I152 Hypertension secondary to endocrine disorders: Secondary | ICD-10-CM | POA: Diagnosis not present

## 2024-04-18 DIAGNOSIS — E1159 Type 2 diabetes mellitus with other circulatory complications: Secondary | ICD-10-CM | POA: Diagnosis not present

## 2024-04-18 DIAGNOSIS — Z6841 Body Mass Index (BMI) 40.0 and over, adult: Secondary | ICD-10-CM

## 2024-04-18 DIAGNOSIS — E1169 Type 2 diabetes mellitus with other specified complication: Secondary | ICD-10-CM | POA: Diagnosis not present

## 2024-04-18 DIAGNOSIS — K760 Fatty (change of) liver, not elsewhere classified: Secondary | ICD-10-CM | POA: Diagnosis not present

## 2024-04-18 DIAGNOSIS — Z7985 Long-term (current) use of injectable non-insulin antidiabetic drugs: Secondary | ICD-10-CM

## 2024-04-18 DIAGNOSIS — E559 Vitamin D deficiency, unspecified: Secondary | ICD-10-CM

## 2024-04-18 MED ORDER — SEMAGLUTIDE (1 MG/DOSE) 4 MG/3ML ~~LOC~~ SOPN: 1.0000 mg | PEN_INJECTOR | SUBCUTANEOUS | 0 refills | Status: DC

## 2024-04-18 MED ORDER — VITAMIN D (ERGOCALCIFEROL) 1.25 MG (50000 UNIT) PO CAPS
ORAL_CAPSULE | ORAL | 0 refills | Status: DC
Start: 2024-04-18 — End: 2024-06-01

## 2024-04-18 NOTE — Progress Notes (Signed)
 Barnie DOROTHA Jenkins, D.O.  ABFM, ABOM Specializing in Clinical Bariatric Medicine  Office located at: [address] [place], [place]  [address]   Assessment and Plan:   Medications Discontinued During This Encounter  Medication Reason   Semaglutide ,0.25 or 0.5MG /DOS, (OZEMPIC , 0.25 OR 0.5 MG/DOSE,) 2 MG/1.5ML SOPN    Vitamin D , Ergocalciferol , (DRISDOL ) 1.25 MG (50000 UNIT) CAPS capsule Reorder     Meds ordered this encounter  Medications   Vitamin D , Ergocalciferol , (DRISDOL ) 1.25 MG (50000 UNIT) CAPS capsule    Sig: 1 po q 21 days    Dispense:  5 capsule    Refill:  0   Semaglutide , 1 MG/DOSE, 4 MG/3ML SOPN    Sig: Inject 1 mg as directed once a week.    Dispense:  3 mL    Refill:  0     FOR THE DISEASE OF OBESITY:  BMI 40.0-44.9, adult (HCC), current BMI 41.32 OBESITY, MORBID with starting BMI 45 Assessment & Plan: Since last office visit on 03/21/2024 patient's muscle mass has increased by 4.4 lbs. Fat mass has decreased by 1.8 lbs. Total body water has not changed (92.4 lbs). Counseling done on how various foods will affect these numbers and how to maximize success  Total lbs lost to date: - 24 lbs  Total weight loss percentage to date: -8.57%    Recommended Dietary Goals Francelia is currently in the action stage of change. As such, her goal is to continue weight management plan.  She has agreed to: continue current plan   Behavioral Intervention We discussed the following today: continue to work on maintaining a reduced calorie state, getting the recommended amount of protein, incorporating whole foods, making healthy choices, staying well hydrated and practicing mindfulness when eating.  Additional resources provided today: None  Evidence-based interventions for health behavior change were utilized today including the discussion of self monitoring techniques, problem-solving barriers and SMART goal setting techniques.   Regarding patient's less desirable  eating habits and patterns, we employed the technique of small changes.   Pt will specifically work on: n/a   Recommended Physical Activity Goals Jupiter has been advised to work up to 300-450 minutes of moderate intensity aerobic activity a week and strengthening exercises 2-3 times per week for cardiovascular health, weight loss maintenance and preservation of muscle mass.   She has agreed to: continue to gradually increase the amount and intensity of exercise routine   Pharmacotherapy See T2DM note.    ASSOCIATED CONDITIONS ADDRESSED TODAY:  She recently had the following labs done with her [facility]CP on 02/17/2024 which I reviewed per pt request: A1c, TSH, T4, Free T3, FLP, and CMP   Type 2 diabetes mellitus with obesity (HCC) Assessment & Plan: Most recent HgbA1c = 5.1. Kidney function WNL. LDL 58, at goal. She is checking her fasting blood sugars. Highest: 107. Lowest: 73. Denies symptoms of hypoglycemia or hyperglycemia. On Ozempic  0.5 mg weekly with good adherence. On daily Miralax and is having normal bowel movements. Hunger and cravings controlled when following PNP.  Shared decision making: INCREASE Ozempic  to 1 mg weekly to further aid with wt loss efforts. Reviewed risks/benefits of upping dose. Encouraged adequate hydration. Increase exercise as able. Continue PNP.     Hypertension associated with type 2 diabetes mellitus (HCC) Assessment & Plan: Last 3 blood pressure readings in our office are as follows: BP Readings from Last 3 Encounters:  04/18/24 (!) 105/56  03/21/24 104/70  02/21/24 112/64   Pt  reports compliance with Zestril 5 mg daily, Lasix 20 mg daily, and Metoprolol  25 mg daily. BP stable. Pt completely asx.   Pt advised to check her blood pressure and heart rate 2-3 times a week. Continue all medications. Discussed that Metoprolol  may contribute to weight gain; consider decreasing dose in the future. Continue with low sodium diet, advance  exercise as tolerated.     Metabolic dysfunction-associated steatotic liver disease (MASLD) Assessment & Plan: She endorses having US  imaging 2 months ago through [facility] which showed improving MASLD. Most recent liver enzymes: AST 40, ALT 70. Continue with reducing saturated fats, simple and added sugars. Increase Ozempic  to 1 mg wkly.      Vitamin D  deficiency Assessment & Plan: Most recent Vitamin D  of 63 on 02/17/2024. Had a bone density scan last week through [facility]; still pending results. Continue strength Vitamin D  q 21 days. Recheck levels around 9/8.     Follow up:   Return 05/16/2024 at 10:00 AM. She was informed of the importance of frequent follow up visits to maximize her success with intensive lifestyle modifications for her multiple health conditions.   Subjective:   Chief complaint: Obesity Shontel is here to discuss her progress with her obesity treatment plan. She is on  modified CAT 1 MP with 6 ounces of lean protein at lunch and states she is following her eating plan approximately 75% of the time. She states she is walking 15 minutes 4 days per week.  Interval History:  Blayne Garlick is here for a follow up office visit. Since last OV on 03/21/2024 , she is up 2 lbs. Food wise, she endorses sometimes skipping meals. Hunger and cravings are controlled on days she gets in all her foods. Not exercising as much as she would have liked due to knee pain; is doing P.T 2x/week and is noticing improvements in knee strength already. Of note, she is scheduled for a sleep study through [facility] next month.    Pharmacotherapy that aid with weight loss: She is currently taking Ozempic  0.5 mg wkly.   Review of Systems:  Pertinent positives were addressed with patient today.  Reviewed by clinician on day of visit: allergies, medications, problem list, medical history, surgical history, family history, social history, and previous encounter  notes.  Weight Summary and Biometrics   Weight Lost Since Last Visit: 0lb  Weight Gained Since Last Visit: 2lb   Vitals Temp: 98.2 F (36.8 C) BP: (!) 105/56 Pulse Rate: 62 SpO2: 99 %   Anthropometric Measurements Height: 5' 6 (1.676 m) Weight: 256 lb (116.1 kg) BMI (Calculated): 41.34 Weight at Last Visit: 254lb Weight Lost Since Last Visit: 0lb Weight Gained Since Last Visit: 2lb Starting Weight: 280lb Total Weight Loss (lbs): 24 lb (10.9 kg) Peak Weight: 330lb   Body Composition  Body Fat %: 45.1 % Fat Mass (lbs): 115.8 lbs Muscle Mass (lbs): 134 lbs Total Body Water (lbs): 92.4 lbs Visceral Fat Rating : 15   Other Clinical Data RMR: [id number] Fasting: No Labs: No Today's Visit #: 21 Starting Date: 07/29/22    Objective:   PHYSICAL EXAM: Blood pressure (!) 105/56, pulse 62, temperature 98.2 F (36.8 C), height 5' 6 (1.676 m), weight 256 lb (116.1 kg), SpO2 99%. Body mass index is 41.32 kg/m.  General: she is overweight, cooperative and in no acute distress. PSYCH: Has normal mood, affect and thought process.   HEENT: EOMI, sclerae are anicteric. Lungs: Normal breathing effort, no conversational dyspnea. Extremities:  Moves * 4 Neurologic: A and O * 3, good insight  DIAGNOSTIC DATA REVIEWED: BMET    Component Value Date/Time   NA 142 12/08/2023 1026   NA 142 10/28/2023 0813   K 4.2 12/08/2023 1026   CL 106 12/08/2023 1026   CO2 31 12/08/2023 1026   GLUCOSE 90 12/08/2023 1026   BUN 30 (H) 12/08/2023 1026   BUN 22 10/28/2023 0813   CREATININE 1.00 12/08/2023 1026   CREATININE 1.06 (H) 04/18/2021 0905   CALCIUM 9.4 12/08/2023 1026   GFRNONAA >60 12/08/2023 1026   GFRNONAA 57 (L) 04/18/2021 0905   GFRAA 66 04/18/2021 0905   Lab Results  Component Value Date   HGBA1C 5.2 01/24/2024   HGBA1C 6.0 11/20/2009   Lab Results  Component Value Date   INSULIN  8.2 07/29/2022   Lab Results  Component Value Date   TSH 1.79 07/08/2023   CBC     Component Value Date/Time   WBC 6.2 12/08/2023 1026   WBC 5.5 06/03/2023 1019   RBC 4.29 12/08/2023 1026   HGB 12.8 12/08/2023 1026   HGB 12.8 10/28/2023 0813   HCT 37.2 12/08/2023 1026   HCT 41.2 10/28/2023 0813   PLT 268 12/08/2023 1026   PLT 272 10/28/2023 0813   MCV 86.7 12/08/2023 1026   MCV 91 10/28/2023 0813   MCH 29.8 12/08/2023 1026   MCHC 34.4 12/08/2023 1026   RDW 13.8 12/08/2023 1026   RDW 14.6 10/28/2023 0813   Iron Studies No results found for: IRON, TIBC, FERRITIN, IRONPCTSAT Lipid Panel     Component Value Date/Time   CHOL 133 07/22/2023 0000   CHOL 129 07/29/2022 0930   TRIG 72 07/22/2023 0000   HDL 56 07/22/2023 0000   HDL 59 07/29/2022 0930   CHOLHDL 2.2 07/29/2022 0930   CHOLHDL 5.0 Ratio 10/07/2009 1913   VLDL 38 10/07/2009 1913   LDLCALC 63 07/22/2023 0000   LDLCALC 53 07/29/2022 0930   Hepatic Function Panel     Component Value Date/Time   PROT 7.6 12/08/2023 1026   PROT 7.2 10/28/2023 0813   ALBUMIN 4.2 12/08/2023 1026   ALBUMIN 4.3 10/28/2023 0813   AST 34 12/08/2023 1026   ALT 55 (H) 12/08/2023 1026   ALKPHOS 66 12/08/2023 1026   BILITOT 0.4 12/08/2023 1026   BILIDIR <0.1 06/03/2023 1019   IBILI NOT CALCULATED 06/03/2023 1019      Component Value Date/Time   TSH 1.79 07/08/2023 0000   TSH 0.589 07/29/2022 0930   Nutritional Lab Results  Component Value Date   VD25OH 76.6 01/24/2024   VD25OH 56.4 09/02/2023   VD25OH 82.9 07/29/2022    Attestations:   I, Special Puri, acting as a Stage manager for [employer], DO., have compiled all relevant documentation for today's office visit on behalf of Barnie Jenkins, DO, while in the presence of [employer], DO.  I have reviewed the above documentation for accuracy and completeness, and I agree with the above. Barnie JINNY Jenkins, D.O.  The 21st Century Cures Act was signed into law in 2016 which includes the topic of electronic health records.  This provides immediate  access to information in MyChart.  This includes consultation notes, operative notes, office notes, lab results and pathology reports.  If you have any questions about what you read please let us  know at your next visit so we can discuss your concerns and take corrective action if need be.  We are right here with you.

## 2024-04-19 ENCOUNTER — Encounter: Payer: Self-pay | Admitting: Physical Therapy

## 2024-04-19 ENCOUNTER — Ambulatory Visit: Admitting: Physical Therapy

## 2024-04-19 DIAGNOSIS — M6281 Muscle weakness (generalized): Secondary | ICD-10-CM

## 2024-04-19 DIAGNOSIS — M25561 Pain in right knee: Secondary | ICD-10-CM | POA: Diagnosis not present

## 2024-04-19 DIAGNOSIS — R262 Difficulty in walking, not elsewhere classified: Secondary | ICD-10-CM

## 2024-04-19 DIAGNOSIS — G8929 Other chronic pain: Secondary | ICD-10-CM

## 2024-04-19 NOTE — Therapy (Signed)
 OUTPATIENT PHYSICAL THERAPY LOWER EXTREMITY TREATMENT   Patient Name: [name] MRN: [id number] DOB:1961/07/20, 63 y.o., female Today's Date: 04/19/2024  END OF SESSION:  PT End of Session - 04/19/24 0931     Visit Number 5    Number of Visits 17    Date for PT Re-Evaluation 06/02/24    Authorization Type [place] MEDICAID [employer]    PT Start Time 0930    PT Stop Time 1015    PT Time Calculation (min) 45 min            Past Medical History:  Diagnosis Date   Anemia    Anxiety    Arthritis    Back pain    Chest pain    Diabetes mellitus without complication (HCC)    recent dx diet controlled   DVT (deep venous thrombosis) (HCC)    remote - took coumadin    Dysmenorrhea    Dysrhythmia    irregular heart beat   Edema    Fatty liver    Fibroid    GERD (gastroesophageal reflux disease)    hx of   Gout    Hypercholesteremia    Hypertension 4 months   Hypothyroid    Joint pain    Menometrorrhagia    Morbid obesity (HCC)    Osteoarthritis    Palpitations    Prediabetes    Substance abuse (HCC)    Vitamin D  deficiency    Past Surgical History:  Procedure Laterality Date   ABDOMINAL HYSTERECTOMY  2008   BREAST BIOPSY Left 2013   benign   BUNIONECTOMY Bilateral    CHOLECYSTECTOMY     COLONOSCOPY WITH PROPOFOL  N/A 10/27/2018   Procedure: COLONOSCOPY WITH PROPOFOL ;  Surgeon: Lennard Lesta FALCON, MD;  Location: [facility];  Service: Endoscopy;  Laterality: N/A;   ECTOPIC PREGNANCY SURGERY  1980's   MANDIBLE OSTEOTOMY Bilateral 07/03/2013   Procedure: BILATERAL TORI;  Surgeon: Glendia CHRISTELLA Primrose, DDS;  Location: [facility];  Service: Oral Surgery;  Laterality: Bilateral;   NM MYOCAR PERF WALL MOTION  07/2012   lexiscan  - normal pattern of perfusion, low risk   ROTATOR CUFF REPAIR Bilateral 2009   SLEEP STUDY  07/27/2012   AHI 4.8/hr   TOOTH EXTRACTION N/A 07/03/2013   Procedure: DENTAL EXTRACTION # 20;  Surgeon: Glendia CHRISTELLA Primrose, DDS;  Location: [facility];   Service: Oral Surgery;  Laterality: N/A;   TRANSTHORACIC ECHOCARDIOGRAM  07/2012   EF=>55%, mod conc LVH; LA mod dilated; trace MR; mild TR with normal RVSP; trace AV regurg   TUBAL LIGATION     Patient Active Problem List   Diagnosis Date Noted   Prediabetes 02/22/2023   Primary osteoarthritis of both hips 12/15/2022   Central adiposity 10/20/2022   Vitamin D  deficiency 08/11/2022   Other fatigue 07/29/2022   SOBOE (shortness of breath on exertion) 07/29/2022   Osteoarthritis of left hip 07/29/2022   Other hyperlipidemia 07/29/2022   Depression screening 07/29/2022   Paresthesia 06/30/2022   Peripheral neuropathy 05/13/2022   Pain in left wrist 04/24/2022   Carpal tunnel syndrome, bilateral 03/18/2022   Plantar fasciitis of right foot 09/03/2021   Pes planus 09/03/2021   Nuclear sclerotic cataract of both eyes 05/27/2021   Diabetes mellitus without complication (HCC) 05/27/2021   Vitreomacular adhesion of both eyes 05/27/2021   Antiphospholipid antibody positive 05/21/2021   Pain in left elbow 02/19/2021   Chronic left-sided low back pain with left-sided sciatica 07/22/2017   Thyroid  nodule 04/16/2017  Essential hypertension 03/13/2014   Menometrorrhagia    Dysmenorrhea    Anemia    Hyperlipidemia 05/23/2007   OBESITY, MORBID with starting BMI 45 05/23/2007   DYSFUNCTIONAL UTERINE BLEEDING - s/p LAVH 05/23/2007   ROTATOR CUFF SYNDROME 05/23/2007   KNEE PAIN, HX OF 05/23/2007    PCP: Joshua Francisco, MD  REFERRING PROVIDER: Jerri Kay HERO, MD  REFERRING DIAG: M17.11 (ICD-10-CM) - Primary osteoarthritis of right knee   THERAPY DIAG:  Chronic pain of right knee  Muscle weakness (generalized)  Difficulty in walking, not elsewhere classified  Rationale for Evaluation and Treatment: Rehabilitation  ONSET DATE: 3 weeks ago  SUBJECTIVE:   SUBJECTIVE STATEMENT: Pt reports she did well after last visit. Knee a little achy this morning. 2/10 pain.   EVAL: Pt reports  about 3 weeks ago she was walking fast and her R knee cap slipped out of place to the outside and she needed to walk straight legged. She experienced significant R knee pain at that time. She was told by Dr. Jerri she has bone on bone OA. Pt notes she has issues with her L hip as well and is to have a L THA sometime in the fall or the beginning of th next year. She states she needs to lose 10 more lbs for the surgery to be scheduled.  PERTINENT HISTORY: High BMI, DM, knee pain  PAIN:  Are you having pain? Yes: NPRS scale: 2/10 Pain location: R knee Pain description: ache Aggravating factors: prolonged walking and standing, steps - 1 at a time up with the L Relieving factors: pain meds, lidocaine  cream  PRECAUTIONS: None  RED FLAGS: None   WEIGHT BEARING RESTRICTIONS: No  FALLS:  Has patient fallen in last 6 months? No A few near falls with the R knee partially giving out  LIVING ENVIRONMENT: Lives with: lives with their family Lives in: House/apartment Stairs: Yes: External: 2 steps; bilateral but cannot reach both Has following equipment at home: Single point cane  OCCUPATION: Disability  PLOF: Independent with community mobility with device  PATIENT GOALS: Strengthening of her R leg  NEXT MD VISIT: Not scheduled  OBJECTIVE:  Note: Objective measures were completed at Evaluation unless otherwise noted.  DIAGNOSTIC FINDINGS: X-rays demonstrate severe osteoarthritis.  Bone-on-bone joint space  narrowing.   PATIENT SURVEYS:  LEFS: 35/80= 44%  COGNITION: Overall cognitive status: Within functional limits for tasks assessed     SENSATION: WFL  EDEMA:  Swelling of the R knee palapated  MUSCLE LENGTH: Hamstrings: Right tight deg; Left tight deg Thomas test: Right tight deg; Left tight deg  POSTURE: increased lumbar lordosis and anterior pelvic tilt  PALPATION: TTP to the lateral joint space  LOWER EXTREMITY ROM:  Active ROM Right eval Left eval  Right 04/03/24 RT 04/12/24  Hip flexion      Hip extension      Hip abduction      Hip adduction      Hip internal rotation      Hip external rotation      Knee flexion 95 110 102 110d  Knee extension 0 0    Ankle dorsiflexion      Ankle plantarflexion      Ankle inversion      Ankle eversion       (Blank rows = not tested)  LOWER EXTREMITY MMT:  MMT Right eval Left eval Right  04/16/24  Hip flexion 3- 4   Hip extension 3- 3   Hip abduction 3-  4   Hip adduction     Hip internal rotation     Hip external rotation 3- 4   Knee flexion 3 4 4   Knee extension 3 4 4   Ankle dorsiflexion     Ankle plantarflexion     Ankle inversion     Ankle eversion      (Blank rows = not tested)  LOWER EXTREMITY SPECIAL TESTS:  Knee special tests: Patellafemoral apprehension test: positive   FUNCTIONAL TESTS:  5 times sit to stand: 40.6 2 minute walk test: TBA  04/03/24: 287 feet 2 MWT  GAIT: Distance walked: 200' Assistive device utilized: Single point cane Level of assistance: Modified independence Comments: Decreased pace                                                                                                                       TREATMENT DATE:  [facility] Adult PT Treatment:                                                DATE: 04/19/24 Therapeutic Exercise/Activity: Nustep L3 x 3 minutes  Heel raise  Alternating knee flexion- increased pain so discontinued QS x 10 SLR to fatigue Bridge x 15 Blue band clam x 20  Banded bridge x 10  STS x 10  LAQ x 20   Neuromuscular re-ed: Tandem stance  Narrow stance on airex with head turns Semi tandem stance on airex with head turns  Alternating hip flexion on Airex without UE   [facility] Adult PT Treatment:                                                DATE: 04/13/24 Therapeutic Exercise/Activity: Seated heel slide AAROM with cloth Supine QS x 10 SLR  x 10 STS mat table 2x5 Tandem standing each foot back SL standing each Banded side  stepping GTB at counter Standing leg curls 2x12 each Heel raises/toe lifts 2x10 Narrow stance on airex Marching on airex  [facility] Adult PT Treatment:                                                DATE: 04/12/24 Therapeutic Exercise/Activity: Seated heel slide AAROM with cloth Supine QS x 10 SLR  x 10 Heel slide using sliding board x 10  Bridge 10 x 2  3 Clam Blue 10 x 2 , supine  H/s curls with feet on ball x 10  LAQ AROM 2x10 STS mat table 2x5  [facility] Adult PT Treatment:  DATE: 04/03/24  LAQ AROM  Seated heel slide AAROM with cloth Supine QS x 10 SLR  x 10 Heel slide using sliding board x 10  Bridge 10 x 2  Clam Blue 10 x 2 , supine  Bridge with feet over ball x 10 H/s curls with feet on ball x 10   [facility] Adult PT Treatment:                                                DATE: 03/31/24 Therapeutic Exercise: Developed, instructed in, and pt completed therex as noted in HEP  Self Care: RICE for symptom management    PATIENT EDUCATION:  Education details: Eval findings, POC, HEP, self care  Person educated: Patient Education method: Explanation, Demonstration, Tactile cues, Verbal cues, and Handouts Education comprehension: verbalized understanding, returned demonstration, verbal cues required, and tactile cues required  HOME EXERCISE PROGRAM: Access Code: [email or web address] URL: [email or web address] Date: 04/12/2024 Prepared by: Dasie Daft  Exercises - Supine Quad Set  - 2 x daily - 7 x weekly - 1 sets - 10 reps - 5 hold - Active Straight Leg Raise with Quad Set  - 2 x daily - 7 x weekly - 1 sets - 10 reps - 3 hold - Supine Bridge  - 2 x daily - 7 x weekly - 1 sets - 10 reps - 3 hold - Bridge with Arms at [employer] and Feet on [employer]  - 2 x daily - 7 x weekly - 1 sets - 10 reps - 3 hold - Hooklying Isometric Clamshell  - 2 x daily - 7 x weekly - 1 sets - 10 reps - 2 hold - Seated Long Arc Quad  - 1 x daily - 7 x weekly -  1 sets - 10 reps - 3 hold - Sit to Stand Without Arm Support  - 1 x daily - 7 x weekly - 1 sets - 10 reps  ASSESSMENT:  CLINICAL IMPRESSION: PT was completed for R knee/LE strengthening. Pt was progressed to more balance exercises and continued closed chain exercises.  Trial of Nustep as an aerobic option with good tolerance. Pt tolerated the prescribed exercises in PT today. Pt's R knee pain continues to be consistently better. Pt will continue to benefit from skilled PT to address impairments for improved function with minimized pain.  EVAL: Patient is a 63 y.o. female who was seen today for physical therapy evaluation and treatment for M17.11 (ICD-10-CM) - Primary osteoarthritis of right knee . Pt presents with decreased strength of the R LE, decreased R knee flexion, and decreased function as noted with the 5xSTS and her decreased pace for ambulation. A HEP was started to address deficits. Pt will benefit from skilled PT 2w8 to address impairments to optimize R knee/LE function with less pain.   OBJECTIVE IMPAIRMENTS: decreased balance, decreased mobility, difficulty walking, decreased ROM, decreased strength, obesity, and pain.   ACTIVITY LIMITATIONS: carrying, lifting, bending, sitting, standing, squatting, sleeping, stairs, bathing, dressing, hygiene/grooming, locomotion level, and caring for others  PARTICIPATION LIMITATIONS: meal prep, cleaning, laundry, shopping, and community activity  PERSONAL FACTORS: Fitness, Past/current experiences, Time since onset of injury/illness/exacerbation, and 1-2 comorbidities: high BMI, DM are also affecting patient's functional outcome.   REHAB POTENTIAL: Good  CLINICAL DECISION MAKING: Stable/uncomplicated  EVALUATION COMPLEXITY: Low   GOALS:  SHORT TERM GOALS: Target date: 04/21/24  Pt will be Ind in an initial HEP  Baseline:started Goal status: ONGOING  2.  Assess Baseline: 287 feet Goal status: MET    LONG TERM GOALS: Target  date: 06/02/24  Pt will be Ind in a final HEP to maintain achieved LOF  Baseline:  Goal status: INITIAL  2.  Increase R knee flexion to 105d for improved function with sitting and steps Baseline: 95d Goal status: met  3.  Increase R knee and hip strength to 3+ or greater for improved functional use of the R LE Baseline: 3- to 3 Goal status: PARTIALLY MET  4.  Improve 5xSTS by MCID of 5 and by MCID of 70ft as indication of improved functional mobility  Baseline: 40.6' c armrests, TBA 04/03/24: 2 MWT 287 feet  Goal status: ONGOING   5.  Pt's LEFS score will improvve by the MCID to 54%% as indication of improved function  Baseline: 44% Goal status: INITIAL  6.  Pt will report 25% or greater improvement in her R knee pain for improved function and QOL Baseline: 4-8/10 Goal status: INITIAL   PLAN:  PT FREQUENCY: 2x/week  PT DURATION: 8 weeks  PLANNED INTERVENTIONS: 97164- PT Re-evaluation, 97110-Therapeutic exercises, 97530- Therapeutic activity, 97112- Neuromuscular re-education, 97535- Self Care, [id number]- Manual therapy, [id number]- Gait training, [phone]- Aquatic Therapy, [phone]- Vasopneumatic device, [phone]- Ionotophoresis 4mg /ml Dexamethasone, 79439 (1-2 muscles), 20561 (3+ muscles)- Dry Needling, Patient/Family education, Balance training, Stair training, Taping, Joint mobilization, Cryotherapy, and Moist heat  PLAN FOR NEXT SESSION:  assess response to HEP; progress therex as indicated; use of modalities, manual therapy; and TPDN as indicated.  Harlene Persons, PTA 04/19/24 10:34 AM Phone: [phone] Fax: [phone]

## 2024-04-20 NOTE — Therapy (Signed)
 OUTPATIENT PHYSICAL THERAPY LOWER EXTREMITY TREATMENT   Patient Name: [name] MRN: [id number] DOB:1961-07-14, 63 y.o., female Today's Date: 04/20/2024  END OF SESSION:      Past Medical History:  Diagnosis Date   Anemia    Anxiety    Arthritis    Back pain    Chest pain    Diabetes mellitus without complication (HCC)    recent dx diet controlled   DVT (deep venous thrombosis) (HCC)    remote - took coumadin    Dysmenorrhea    Dysrhythmia    irregular heart beat   Edema    Fatty liver    Fibroid    GERD (gastroesophageal reflux disease)    hx of   Gout    Hypercholesteremia    Hypertension 4 months   Hypothyroid    Joint pain    Menometrorrhagia    Morbid obesity (HCC)    Osteoarthritis    Palpitations    Prediabetes    Substance abuse (HCC)    Vitamin D  deficiency    Past Surgical History:  Procedure Laterality Date   ABDOMINAL HYSTERECTOMY  2008   BREAST BIOPSY Left 2013   benign   BUNIONECTOMY Bilateral    CHOLECYSTECTOMY     COLONOSCOPY WITH PROPOFOL  N/A 10/27/2018   Procedure: COLONOSCOPY WITH PROPOFOL ;  Surgeon: Lennard Lesta FALCON, MD;  Location: [facility];  Service: Endoscopy;  Laterality: N/A;   ECTOPIC PREGNANCY SURGERY  1980's   MANDIBLE OSTEOTOMY Bilateral 07/03/2013   Procedure: BILATERAL TORI;  Surgeon: Glendia CHRISTELLA Primrose, DDS;  Location: [facility];  Service: Oral Surgery;  Laterality: Bilateral;   NM MYOCAR PERF WALL MOTION  07/2012   lexiscan  - normal pattern of perfusion, low risk   ROTATOR CUFF REPAIR Bilateral 2009   SLEEP STUDY  07/27/2012   AHI 4.8/hr   TOOTH EXTRACTION N/A 07/03/2013   Procedure: DENTAL EXTRACTION # 20;  Surgeon: Glendia CHRISTELLA Primrose, DDS;  Location: [facility];  Service: Oral Surgery;  Laterality: N/A;   TRANSTHORACIC ECHOCARDIOGRAM  07/2012   EF=>55%, mod conc LVH; LA mod dilated; trace MR; mild TR with normal RVSP; trace AV regurg   TUBAL LIGATION     Patient Active Problem List   Diagnosis Date Noted   Prediabetes  02/22/2023   Primary osteoarthritis of both hips 12/15/2022   Central adiposity 10/20/2022   Vitamin D  deficiency 08/11/2022   Other fatigue 07/29/2022   SOBOE (shortness of breath on exertion) 07/29/2022   Osteoarthritis of left hip 07/29/2022   Other hyperlipidemia 07/29/2022   Depression screening 07/29/2022   Paresthesia 06/30/2022   Peripheral neuropathy 05/13/2022   Pain in left wrist 04/24/2022   Carpal tunnel syndrome, bilateral 03/18/2022   Plantar fasciitis of right foot 09/03/2021   Pes planus 09/03/2021   Nuclear sclerotic cataract of both eyes 05/27/2021   Diabetes mellitus without complication (HCC) 05/27/2021   Vitreomacular adhesion of both eyes 05/27/2021   Antiphospholipid antibody positive 05/21/2021   Pain in left elbow 02/19/2021   Chronic left-sided low back pain with left-sided sciatica 07/22/2017   Thyroid  nodule 04/16/2017   Essential hypertension 03/13/2014   Menometrorrhagia    Dysmenorrhea    Anemia    Hyperlipidemia 05/23/2007   OBESITY, MORBID with starting BMI 45 05/23/2007   DYSFUNCTIONAL UTERINE BLEEDING - s/p LAVH 05/23/2007   ROTATOR CUFF SYNDROME 05/23/2007   KNEE PAIN, HX OF 05/23/2007    PCP: Joshua Francisco, MD  REFERRING PROVIDER: Jerri Kay CHRISTELLA, MD  REFERRING DIAG:  M17.11 (ICD-10-CM) - Primary osteoarthritis of right knee   THERAPY DIAG:  No diagnosis found.  Rationale for Evaluation and Treatment: Rehabilitation  ONSET DATE: 3 weeks ago  SUBJECTIVE:   SUBJECTIVE STATEMENT: Pt reports she is hurting all over more today: back, hips, and knees. Pt notes she has been very active this week. She notes her R knee feels more swollen.  EVAL: Pt reports about 3 weeks ago she was walking fast and her R knee cap slipped out of place to the outside and she needed to walk straight legged. She experienced significant R knee pain at that time. She was told by Dr. Jerri she has bone on bone OA. Pt notes she has issues with her L hip as well and is  to have a L THA sometime in the fall or the beginning of th next year. She states she needs to lose 10 more lbs for the surgery to be scheduled.  PERTINENT HISTORY: High BMI, DM, knee pain  PAIN:  Are you having pain? Yes: NPRS scale: 510 Pain location: R knee Pain description: ache Aggravating factors: prolonged walking and standing, steps - 1 at a time up with the L Relieving factors: pain meds, lidocaine  cream  PRECAUTIONS: None  RED FLAGS: None   WEIGHT BEARING RESTRICTIONS: No  FALLS:  Has patient fallen in last 6 months? No A few near falls with the R knee partially giving out  LIVING ENVIRONMENT: Lives with: lives with their family Lives in: House/apartment Stairs: Yes: External: 2 steps; bilateral but cannot reach both Has following equipment at home: Single point cane  OCCUPATION: Disability  PLOF: Independent with community mobility with device  PATIENT GOALS: Strengthening of her R leg  NEXT MD VISIT: Not scheduled  OBJECTIVE:  Note: Objective measures were completed at Evaluation unless otherwise noted.  DIAGNOSTIC FINDINGS: X-rays demonstrate severe osteoarthritis.  Bone-on-bone joint space  narrowing.   PATIENT SURVEYS:  LEFS: 35/80= 44%  COGNITION: Overall cognitive status: Within functional limits for tasks assessed     SENSATION: WFL  EDEMA:  Swelling of the R knee palapated  MUSCLE LENGTH: Hamstrings: Right tight deg; Left tight deg Thomas test: Right tight deg; Left tight deg  POSTURE: increased lumbar lordosis and anterior pelvic tilt  PALPATION: TTP to the lateral joint space  LOWER EXTREMITY ROM:  Active ROM Right eval Left eval Right 04/03/24 RT 04/12/24  Hip flexion      Hip extension      Hip abduction      Hip adduction      Hip internal rotation      Hip external rotation      Knee flexion 95 110 102 110d  Knee extension 0 0    Ankle dorsiflexion      Ankle plantarflexion      Ankle inversion      Ankle eversion        (Blank rows = not tested)  LOWER EXTREMITY MMT:  MMT Right eval Left eval Right  04/16/24  Hip flexion 3- 4   Hip extension 3- 3   Hip abduction 3- 4   Hip adduction     Hip internal rotation     Hip external rotation 3- 4   Knee flexion 3 4 4   Knee extension 3 4 4   Ankle dorsiflexion     Ankle plantarflexion     Ankle inversion     Ankle eversion      (Blank rows = not tested)  LOWER  EXTREMITY SPECIAL TESTS:  Knee special tests: Patellafemoral apprehension test: positive   FUNCTIONAL TESTS:  5 times sit to stand: 40.6 2 minute walk test: TBA  04/03/24: 287 feet 2 MWT  GAIT: Distance walked: 200' Assistive device utilized: Single point cane Level of assistance: Modified independence Comments: Decreased pace                                                                                                                       TREATMENT DATE:  [facility] Adult PT Treatment:                                                DATE: 04/20/24 Nustep L3 x 5 minutes UE/LE Quad sets x10 5  SLR c QS x10  Bridge x 15 SL Blue band clam GTB 2x 10 each Supine kne2 flexion c swiss ball 2x10 LAQ 2x 10   [facility] Adult PT Treatment:                                                DATE: 04/19/24 Therapeutic Exercise/Activity: Nustep L3 x 3 minutes  Heel raise  Alternating knee flexion- increased pain so discontinued QS x 10 SLR to fatigue Bridge x 15 Blue band clam x 20  Banded bridge x 10  STS x 10  LAQ x 20   Neuromuscular re-ed: Tandem stance  Narrow stance on airex with head turns Semi tandem stance on airex with head turns  Alternating hip flexion on Airex without UE  PATIENT EDUCATION:  Education details: Eval findings, POC, HEP, self care  Person educated: Patient Education method: Explanation, Demonstration, Tactile cues, Verbal cues, and Handouts Education comprehension: verbalized understanding, returned demonstration, verbal cues required, and tactile cues  required  HOME EXERCISE PROGRAM: Access Code: [email or web address] URL: [email or web address] Date: 04/12/2024 Prepared by: Dasie Daft  Exercises - Supine Quad Set  - 2 x daily - 7 x weekly - 1 sets - 10 reps - 5 hold - Active Straight Leg Raise with Quad Set  - 2 x daily - 7 x weekly - 1 sets - 10 reps - 3 hold - Supine Bridge  - 2 x daily - 7 x weekly - 1 sets - 10 reps - 3 hold - Bridge with Arms at [employer] and Feet on [employer]  - 2 x daily - 7 x weekly - 1 sets - 10 reps - 3 hold - Hooklying Isometric Clamshell  - 2 x daily - 7 x weekly - 1 sets - 10 reps - 2 hold - Seated Long Arc Quad  - 1 x daily - 7 x weekly - 1 sets - 10 reps - 3 hold - Sit to Stand Without Arm Support  -  1 x daily - 7 x weekly - 1 sets - 10 reps  ASSESSMENT:  CLINICAL IMPRESSION: PT was completed for R knee/LE strengthening. With pt reporting to PT today with increased pain in multiple areas, the demand was reduced. Pt tolerated the prescribed exercises in PT today without adverse effects. Overall, pt's R knee is improving. Pt will continue to benefit from skilled PT to address impairments for improved function with minimized pain.  EVAL: Patient is a 63 y.o. female who was seen today for physical therapy evaluation and treatment for M17.11 (ICD-10-CM) - Primary osteoarthritis of right knee . Pt presents with decreased strength of the R LE, decreased R knee flexion, and decreased function as noted with the 5xSTS and her decreased pace for ambulation. A HEP was started to address deficits. Pt will benefit from skilled PT 2w8 to address impairments to optimize R knee/LE function with less pain.   OBJECTIVE IMPAIRMENTS: decreased balance, decreased mobility, difficulty walking, decreased ROM, decreased strength, obesity, and pain.   ACTIVITY LIMITATIONS: carrying, lifting, bending, sitting, standing, squatting, sleeping, stairs, bathing, dressing, hygiene/grooming, locomotion level, and caring for  others  PARTICIPATION LIMITATIONS: meal prep, cleaning, laundry, shopping, and community activity  PERSONAL FACTORS: Fitness, Past/current experiences, Time since onset of injury/illness/exacerbation, and 1-2 comorbidities: high BMI, DM are also affecting patient's functional outcome.   REHAB POTENTIAL: Good  CLINICAL DECISION MAKING: Stable/uncomplicated  EVALUATION COMPLEXITY: Low   GOALS:  SHORT TERM GOALS: Target date: 04/21/24 Pt will be Ind in an initial HEP  Baseline:started Goal status: ONGOING  2.  Assess Baseline: 287 feet Goal status: MET    LONG TERM GOALS: Target date: 06/02/24  Pt will be Ind in a final HEP to maintain achieved LOF  Baseline:  Goal status: INITIAL  2.  Increase R knee flexion to 105d for improved function with sitting and steps Baseline: 95d Goal status: met  3.  Increase R knee and hip strength to 3+ or greater for improved functional use of the R LE Baseline: 3- to 3 Goal status: PARTIALLY MET  4.  Improve 5xSTS by MCID of 5 and by MCID of 41ft as indication of improved functional mobility  Baseline: 40.6' c armrests, TBA 04/03/24: 2 MWT 287 feet  Goal status: ONGOING   5.  Pt's LEFS score will improvve by the MCID to 54%% as indication of improved function  Baseline: 44% Goal status: INITIAL  6.  Pt will report 25% or greater improvement in her R knee pain for improved function and QOL Baseline: 4-8/10 Goal status: INITIAL   PLAN:  PT FREQUENCY: 2x/week  PT DURATION: 8 weeks  PLANNED INTERVENTIONS: 97164- PT Re-evaluation, 97110-Therapeutic exercises, 97530- Therapeutic activity, 97112- Neuromuscular re-education, 97535- Self Care, 02859- Manual therapy, [id number]- Gait training, [phone]- Aquatic Therapy, [phone]- Vasopneumatic device, [phone]- Ionotophoresis 4mg /ml Dexamethasone, 79439 (1-2 muscles), 20561 (3+ muscles)- Dry Needling, Patient/Family education, Balance training, Stair training, Taping, Joint mobilization,  Cryotherapy, and Moist heat  PLAN FOR NEXT SESSION:  assess response to HEP; progress therex as indicated; use of modalities, manual therapy; and TPDN as indicated.  Aysel Gilchrest MS, PT 04/20/24 6:24 AM

## 2024-04-21 ENCOUNTER — Ambulatory Visit

## 2024-04-21 DIAGNOSIS — G8929 Other chronic pain: Secondary | ICD-10-CM

## 2024-04-21 DIAGNOSIS — M25561 Pain in right knee: Secondary | ICD-10-CM | POA: Diagnosis not present

## 2024-04-21 DIAGNOSIS — M6281 Muscle weakness (generalized): Secondary | ICD-10-CM

## 2024-04-21 DIAGNOSIS — R262 Difficulty in walking, not elsewhere classified: Secondary | ICD-10-CM

## 2024-04-24 ENCOUNTER — Ambulatory Visit: Admitting: Physical Therapy

## 2024-04-24 ENCOUNTER — Encounter: Payer: Self-pay | Admitting: Physical Therapy

## 2024-04-24 DIAGNOSIS — M25561 Pain in right knee: Secondary | ICD-10-CM | POA: Diagnosis not present

## 2024-04-24 DIAGNOSIS — M6281 Muscle weakness (generalized): Secondary | ICD-10-CM

## 2024-04-24 DIAGNOSIS — G8929 Other chronic pain: Secondary | ICD-10-CM

## 2024-04-24 NOTE — Therapy (Signed)
 OUTPATIENT PHYSICAL THERAPY LOWER EXTREMITY TREATMENT   Patient Name: [name] MRN: [id number] DOB:12/21/1960, 63 y.o., female Today's Date: 04/24/2024  END OF SESSION:  PT End of Session - 04/24/24 1056     Visit Number 7    Number of Visits 17    Date for PT Re-Evaluation 06/02/24    Authorization Type [place] MEDICAID [employer]    PT Start Time 1100    PT Stop Time 1145    PT Time Calculation (min) 45 min             Past Medical History:  Diagnosis Date   Anemia    Anxiety    Arthritis    Back pain    Chest pain    Diabetes mellitus without complication (HCC)    recent dx diet controlled   DVT (deep venous thrombosis) (HCC)    remote - took coumadin    Dysmenorrhea    Dysrhythmia    irregular heart beat   Edema    Fatty liver    Fibroid    GERD (gastroesophageal reflux disease)    hx of   Gout    Hypercholesteremia    Hypertension 4 months   Hypothyroid    Joint pain    Menometrorrhagia    Morbid obesity (HCC)    Osteoarthritis    Palpitations    Prediabetes    Substance abuse (HCC)    Vitamin D  deficiency    Past Surgical History:  Procedure Laterality Date   ABDOMINAL HYSTERECTOMY  2008   BREAST BIOPSY Left 2013   benign   BUNIONECTOMY Bilateral    CHOLECYSTECTOMY     COLONOSCOPY WITH PROPOFOL  N/A 10/27/2018   Procedure: COLONOSCOPY WITH PROPOFOL ;  Surgeon: Lennard Lesta FALCON, MD;  Location: [facility];  Service: Endoscopy;  Laterality: N/A;   ECTOPIC PREGNANCY SURGERY  1980's   MANDIBLE OSTEOTOMY Bilateral 07/03/2013   Procedure: BILATERAL TORI;  Surgeon: Glendia CHRISTELLA Primrose, DDS;  Location: [facility];  Service: Oral Surgery;  Laterality: Bilateral;   NM MYOCAR PERF WALL MOTION  07/2012   lexiscan  - normal pattern of perfusion, low risk   ROTATOR CUFF REPAIR Bilateral 2009   SLEEP STUDY  07/27/2012   AHI 4.8/hr   TOOTH EXTRACTION N/A 07/03/2013   Procedure: DENTAL EXTRACTION # 20;  Surgeon: Glendia CHRISTELLA Primrose, DDS;  Location: [facility];  Service: Oral Surgery;  Laterality: N/A;   TRANSTHORACIC ECHOCARDIOGRAM  07/2012   EF=>55%, mod conc LVH; LA mod dilated; trace MR; mild TR with normal RVSP; trace AV regurg   TUBAL LIGATION     Patient Active Problem List   Diagnosis Date Noted   Prediabetes 02/22/2023   Primary osteoarthritis of both hips 12/15/2022   Central adiposity 10/20/2022   Vitamin D  deficiency 08/11/2022   Other fatigue 07/29/2022   SOBOE (shortness of breath on exertion) 07/29/2022   Osteoarthritis of left hip 07/29/2022   Other hyperlipidemia 07/29/2022   Depression screening 07/29/2022   Paresthesia 06/30/2022   Peripheral neuropathy 05/13/2022   Pain in left wrist 04/24/2022   Carpal tunnel syndrome, bilateral 03/18/2022   Plantar fasciitis of right foot 09/03/2021   Pes planus 09/03/2021   Nuclear sclerotic cataract of both eyes 05/27/2021   Diabetes mellitus without complication (HCC) 05/27/2021   Vitreomacular adhesion of both eyes 05/27/2021   Antiphospholipid antibody positive 05/21/2021   Pain in left elbow 02/19/2021   Chronic left-sided low back pain with left-sided sciatica 07/22/2017   Thyroid  nodule 04/16/2017  Essential hypertension 03/13/2014   Menometrorrhagia    Dysmenorrhea    Anemia    Hyperlipidemia 05/23/2007   OBESITY, MORBID with starting BMI 45 05/23/2007   DYSFUNCTIONAL UTERINE BLEEDING - s/p LAVH 05/23/2007   ROTATOR CUFF SYNDROME 05/23/2007   KNEE PAIN, HX OF 05/23/2007    PCP: Joshua Francisco, MD  REFERRING PROVIDER: Jerri Kay HERO, MD  REFERRING DIAG: M17.11 (ICD-10-CM) - Primary osteoarthritis of right knee   THERAPY DIAG:  Chronic pain of right knee  Muscle weakness (generalized)  Rationale for Evaluation and Treatment: Rehabilitation  ONSET DATE: 3 weeks ago  SUBJECTIVE:   SUBJECTIVE STATEMENT: Pt reports she is in less pain today, ready to push the exercises a little bit today.    EVAL: Pt reports about 3 weeks ago she was walking fast and  her R knee cap slipped out of place to the outside and she needed to walk straight legged. She experienced significant R knee pain at that time. She was told by Dr. Jerri she has bone on bone OA. Pt notes she has issues with her L hip as well and is to have a L THA sometime in the fall or the beginning of th next year. She states she needs to lose 10 more lbs for the surgery to be scheduled.  PERTINENT HISTORY: High BMI, DM, knee pain  PAIN:  Are you having pain? Yes: NPRS scale: 010 Pain location: R knee Pain description: ache Aggravating factors: prolonged walking and standing, steps - 1 at a time up with the L Relieving factors: pain meds, lidocaine  cream  PRECAUTIONS: None  RED FLAGS: None   WEIGHT BEARING RESTRICTIONS: No  FALLS:  Has patient fallen in last 6 months? No A few near falls with the R knee partially giving out  LIVING ENVIRONMENT: Lives with: lives with their family Lives in: House/apartment Stairs: Yes: External: 2 steps; bilateral but cannot reach both Has following equipment at home: Single point cane  OCCUPATION: Disability  PLOF: Independent with community mobility with device  PATIENT GOALS: Strengthening of her R leg  NEXT MD VISIT: Not scheduled  OBJECTIVE:  Note: Objective measures were completed at Evaluation unless otherwise noted.  DIAGNOSTIC FINDINGS: X-rays demonstrate severe osteoarthritis.  Bone-on-bone joint space  narrowing.   PATIENT SURVEYS:  LEFS: 35/80= 44%  COGNITION: Overall cognitive status: Within functional limits for tasks assessed     SENSATION: WFL  EDEMA:  Swelling of the R knee palapated  MUSCLE LENGTH: Hamstrings: Right tight deg; Left tight deg Thomas test: Right tight deg; Left tight deg  POSTURE: increased lumbar lordosis and anterior pelvic tilt  PALPATION: TTP to the lateral joint space  LOWER EXTREMITY ROM:  Active ROM Right eval Left eval Right 04/03/24 RT 04/12/24  Hip flexion      Hip  extension      Hip abduction      Hip adduction      Hip internal rotation      Hip external rotation      Knee flexion 95 110 102 110d  Knee extension 0 0    Ankle dorsiflexion      Ankle plantarflexion      Ankle inversion      Ankle eversion       (Blank rows = not tested)  LOWER EXTREMITY MMT:  MMT Right eval Left eval Right  04/16/24  Hip flexion 3- 4   Hip extension 3- 3   Hip abduction 3- 4   Hip adduction  Hip internal rotation     Hip external rotation 3- 4   Knee flexion 3 4 4   Knee extension 3 4 4   Ankle dorsiflexion     Ankle plantarflexion     Ankle inversion     Ankle eversion      (Blank rows = not tested)  LOWER EXTREMITY SPECIAL TESTS:  Knee special tests: Patellafemoral apprehension test: positive   FUNCTIONAL TESTS:  5 times sit to stand: 40.6 2 minute walk test: TBA  04/03/24: 287 feet 2 MWT  GAIT: Distance walked: 200' Assistive device utilized: Single point cane Level of assistance: Modified independence Comments: Decreased pace                                                                                                                       TREATMENT DATE:  [facility] Adult PT Treatment:                                                DATE: 04/24/24  Nustep L6 UE/LE x 5 minutes  Heel raises  Standing hip abduction 2 x 10 each  Standing hip flexion 2 x 10  STS x 12 4 inch step up with 1 UE x 10 LAQ 3# x 20  Knee flexion red band x 20 Bridge x 15  Blue band clam supine x 15 Banded bridge x 10     [facility] Adult PT Treatment:                                                DATE: 04/20/24 Nustep L3 x 5 minutes UE/LE Quad sets x10 5  SLR c QS x10  Bridge x 15 SL Blue band clam GTB 2x 10 each Supine kne2 flexion c swiss ball 2x10 LAQ 2x 10   [facility] Adult PT Treatment:                                                DATE: 04/19/24 Therapeutic Exercise/Activity: Nustep L3 x 3 minutes  Heel raise  Alternating knee flexion- increased pain so  discontinued QS x 10 SLR to fatigue Bridge x 15 Blue band clam x 20  Banded bridge x 10  STS x 10  LAQ x 20   Neuromuscular re-ed: Tandem stance  Narrow stance on airex with head turns Semi tandem stance on airex with head turns  Alternating hip flexion on Airex without UE  PATIENT EDUCATION:  Education details: Eval findings, POC, HEP, self care  Person educated: Patient Education method: Explanation, Demonstration, Tactile cues, Verbal cues, and Handouts Education comprehension:  verbalized understanding, returned demonstration, verbal cues required, and tactile cues required  HOME EXERCISE PROGRAM: Access Code: [email or web address] URL: [email or web address] Date: 04/12/2024 Prepared by: Dasie Daft  Exercises - Supine Quad Set  - 2 x daily - 7 x weekly - 1 sets - 10 reps - 5 hold - Active Straight Leg Raise with Quad Set  - 2 x daily - 7 x weekly - 1 sets - 10 reps - 3 hold - Supine Bridge  - 2 x daily - 7 x weekly - 1 sets - 10 reps - 3 hold - Bridge with Arms at [employer] and Feet on [employer]  - 2 x daily - 7 x weekly - 1 sets - 10 reps - 3 hold - Hooklying Isometric Clamshell  - 2 x daily - 7 x weekly - 1 sets - 10 reps - 2 hold - Seated Long Arc Quad  - 1 x daily - 7 x weekly - 1 sets - 10 reps - 3 hold - Sit to Stand Without Arm Support  - 1 x daily - 7 x weekly - 1 sets - 10 reps  ASSESSMENT:  CLINICAL IMPRESSION: PT was completed for R knee/LE strengthening. With pt reporting to PT today with decreased pain and able to progress with more closed chain activity and no increased pain. .  Pt tolerated the prescribed exercises in PT today without adverse effects. Overall, pt's R knee is improving. Pt will continue to benefit from skilled PT to address impairments for improved function with minimized pain.  EVAL: Patient is a 63 y.o. female who was seen today for physical therapy evaluation and treatment for M17.11 (ICD-10-CM) - Primary osteoarthritis of right knee . Pt  presents with decreased strength of the R LE, decreased R knee flexion, and decreased function as noted with the 5xSTS and her decreased pace for ambulation. A HEP was started to address deficits. Pt will benefit from skilled PT 2w8 to address impairments to optimize R knee/LE function with less pain.   OBJECTIVE IMPAIRMENTS: decreased balance, decreased mobility, difficulty walking, decreased ROM, decreased strength, obesity, and pain.   ACTIVITY LIMITATIONS: carrying, lifting, bending, sitting, standing, squatting, sleeping, stairs, bathing, dressing, hygiene/grooming, locomotion level, and caring for others  PARTICIPATION LIMITATIONS: meal prep, cleaning, laundry, shopping, and community activity  PERSONAL FACTORS: Fitness, Past/current experiences, Time since onset of injury/illness/exacerbation, and 1-2 comorbidities: high BMI, DM are also affecting patient's functional outcome.   REHAB POTENTIAL: Good  CLINICAL DECISION MAKING: Stable/uncomplicated  EVALUATION COMPLEXITY: Low   GOALS:  SHORT TERM GOALS: Target date: 04/21/24 Pt will be Ind in an initial HEP  Baseline:started Goal status: ONGOING  2.  Assess Baseline: 287 feet Goal status: MET    LONG TERM GOALS: Target date: 06/02/24  Pt will be Ind in a final HEP to maintain achieved LOF  Baseline:  Goal status: INITIAL  2.  Increase R knee flexion to 105d for improved function with sitting and steps Baseline: 95d Goal status: met  3.  Increase R knee and hip strength to 3+ or greater for improved functional use of the R LE Baseline: 3- to 3 Goal status: PARTIALLY MET  4.  Improve 5xSTS by MCID of 5 and by MCID of 71ft as indication of improved functional mobility  Baseline: 40.6' c armrests, TBA 04/03/24: 2 MWT 287 feet  Goal status: ONGOING   5.  Pt's LEFS score will improvve by the MCID to 54%% as indication of improved function  Baseline:  44% Goal status: INITIAL  6.  Pt will report 25% or  greater improvement in her R knee pain for improved function and QOL Baseline: 4-8/10 Goal status: INITIAL   PLAN:  PT FREQUENCY: 2x/week  PT DURATION: 8 weeks  PLANNED INTERVENTIONS: 97164- PT Re-evaluation, 97110-Therapeutic exercises, 97530- Therapeutic activity, 97112- Neuromuscular re-education, 97535- Self Care, 02859- Manual therapy, [id number]- Gait training, [phone]- Aquatic Therapy, [phone]- Vasopneumatic device, [phone]- Ionotophoresis 4mg /ml Dexamethasone, 79439 (1-2 muscles), 20561 (3+ muscles)- Dry Needling, Patient/Family education, Balance training, Stair training, Taping, Joint mobilization, Cryotherapy, and Moist heat  PLAN FOR NEXT SESSION:  assess response to HEP; progress therex as indicated; use of modalities, manual therapy; and TPDN as indicated.  Harlene Persons, PTA 04/24/24 11:45 AM Phone: [phone] Fax: [phone]

## 2024-04-26 NOTE — Therapy (Signed)
 OUTPATIENT PHYSICAL THERAPY LOWER EXTREMITY TREATMENT   Patient Name: [name] MRN: [id number] DOB:1960/12/25, 63 y.o., female Today's Date: 04/27/2024  END OF SESSION:  PT End of Session - 04/27/24 1029     Visit Number 8    Number of Visits 17    Date for PT Re-Evaluation 06/02/24    Authorization Type [place] MEDICAID [employer]    PT Start Time 1022    PT Stop Time 1102    PT Time Calculation (min) 40 min    Activity Tolerance Patient tolerated treatment well    Behavior During Therapy WFL for tasks assessed/performed              Past Medical History:  Diagnosis Date   Anemia    Anxiety    Arthritis    Back pain    Chest pain    Diabetes mellitus without complication (HCC)    recent dx diet controlled   DVT (deep venous thrombosis) (HCC)    remote - took coumadin    Dysmenorrhea    Dysrhythmia    irregular heart beat   Edema    Fatty liver    Fibroid    GERD (gastroesophageal reflux disease)    hx of   Gout    Hypercholesteremia    Hypertension 4 months   Hypothyroid    Joint pain    Menometrorrhagia    Morbid obesity (HCC)    Osteoarthritis    Palpitations    Prediabetes    Substance abuse (HCC)    Vitamin D  deficiency    Past Surgical History:  Procedure Laterality Date   ABDOMINAL HYSTERECTOMY  2008   BREAST BIOPSY Left 2013   benign   BUNIONECTOMY Bilateral    CHOLECYSTECTOMY     COLONOSCOPY WITH PROPOFOL  N/A 10/27/2018   Procedure: COLONOSCOPY WITH PROPOFOL ;  Surgeon: Lennard Lesta FALCON, MD;  Location: [facility];  Service: Endoscopy;  Laterality: N/A;   ECTOPIC PREGNANCY SURGERY  1980's   MANDIBLE OSTEOTOMY Bilateral 07/03/2013   Procedure: BILATERAL TORI;  Surgeon: Glendia CHRISTELLA Primrose, DDS;  Location: [facility];  Service: Oral Surgery;  Laterality: Bilateral;   NM MYOCAR PERF WALL MOTION  07/2012   lexiscan  - normal pattern of perfusion, low risk   ROTATOR CUFF REPAIR Bilateral 2009   SLEEP STUDY  07/27/2012   AHI  4.8/hr   TOOTH EXTRACTION N/A 07/03/2013   Procedure: DENTAL EXTRACTION # 20;  Surgeon: Glendia CHRISTELLA Primrose, DDS;  Location: [facility];  Service: Oral Surgery;  Laterality: N/A;   TRANSTHORACIC ECHOCARDIOGRAM  07/2012   EF=>55%, mod conc LVH; LA mod dilated; trace MR; mild TR with normal RVSP; trace AV regurg   TUBAL LIGATION     Patient Active Problem List   Diagnosis Date Noted   Prediabetes 02/22/2023   Primary osteoarthritis of both hips 12/15/2022   Central adiposity 10/20/2022   Vitamin D  deficiency 08/11/2022   Other fatigue 07/29/2022   SOBOE (shortness of breath on exertion) 07/29/2022   Osteoarthritis of left hip 07/29/2022   Other hyperlipidemia 07/29/2022   Depression screening 07/29/2022   Paresthesia 06/30/2022   Peripheral neuropathy 05/13/2022   Pain in left wrist 04/24/2022   Carpal tunnel syndrome, bilateral 03/18/2022   Plantar fasciitis of right foot 09/03/2021   Pes planus 09/03/2021   Nuclear sclerotic cataract of both eyes 05/27/2021   Diabetes mellitus without complication (HCC) 05/27/2021   Vitreomacular adhesion of both eyes 05/27/2021   Antiphospholipid antibody positive 05/21/2021   Pain  in left elbow 02/19/2021   Chronic left-sided low back pain with left-sided sciatica 07/22/2017   Thyroid  nodule 04/16/2017   Essential hypertension 03/13/2014   Menometrorrhagia    Dysmenorrhea    Anemia    Hyperlipidemia 05/23/2007   OBESITY, MORBID with starting BMI 45 05/23/2007   DYSFUNCTIONAL UTERINE BLEEDING - s/p LAVH 05/23/2007   ROTATOR CUFF SYNDROME 05/23/2007   KNEE PAIN, HX OF 05/23/2007    PCP: Joshua Francisco, MD  REFERRING PROVIDER: Jerri Kay HERO, MD  REFERRING DIAG: M17.11 (ICD-10-CM) - Primary osteoarthritis of right knee   THERAPY DIAG:  Chronic pain of right knee  Muscle weakness (generalized)  Difficulty in walking, not elsewhere classified  Rationale for Evaluation and Treatment: Rehabilitation  ONSET DATE: 3 weeks ago  SUBJECTIVE:    SUBJECTIVE STATEMENT: Pt reports the R knee is continuing to feel better.  EVAL: Pt reports about 3 weeks ago she was walking fast and her R knee cap slipped out of place to the outside and she needed to walk straight legged. She experienced significant R knee pain at that time. She was told by Dr. Jerri she has bone on bone OA. Pt notes she has issues with her L hip as well and is to have a L THA sometime in the fall or the beginning of th next year. She states she needs to lose 10 more lbs for the surgery to be scheduled.  PERTINENT HISTORY: High BMI, DM, knee pain  PAIN:  Are you having pain? Yes: NPRS scale: 010 Pain location: R knee Pain description: ache Aggravating factors: prolonged walking and standing, steps - 1 at a time up with the L Relieving factors: pain meds, lidocaine  cream  PRECAUTIONS: None  RED FLAGS: None   WEIGHT BEARING RESTRICTIONS: No  FALLS:  Has patient fallen in last 6 months? No A few near falls with the R knee partially giving out  LIVING ENVIRONMENT: Lives with: lives with their family Lives in: House/apartment Stairs: Yes: External: 2 steps; bilateral but cannot reach both Has following equipment at home: Single point cane  OCCUPATION: Disability  PLOF: Independent with community mobility with device  PATIENT GOALS: Strengthening of her R leg  NEXT MD VISIT: Not scheduled  OBJECTIVE:  Note: Objective measures were completed at Evaluation unless otherwise noted.  DIAGNOSTIC FINDINGS: X-rays demonstrate severe osteoarthritis.  Bone-on-bone joint space  narrowing.   PATIENT SURVEYS:  LEFS: 35/80= 44%  COGNITION: Overall cognitive status: Within functional limits for tasks assessed     SENSATION: WFL  EDEMA:  Swelling of the R knee palapated  MUSCLE LENGTH: Hamstrings: Right tight deg; Left tight deg Thomas test: Right tight deg; Left tight deg  POSTURE: increased lumbar lordosis and anterior pelvic tilt  PALPATION: TTP to  the lateral joint space  LOWER EXTREMITY ROM:  Active ROM Right eval Left eval Right 04/03/24 RT 04/12/24  Hip flexion      Hip extension      Hip abduction      Hip adduction      Hip internal rotation      Hip external rotation      Knee flexion 95 110 102 110d  Knee extension 0 0    Ankle dorsiflexion      Ankle plantarflexion      Ankle inversion      Ankle eversion       (Blank rows = not tested)  LOWER EXTREMITY MMT:  MMT Right eval Left eval Right  04/16/24  Hip flexion 3- 4   Hip extension 3- 3   Hip abduction 3- 4   Hip adduction     Hip internal rotation     Hip external rotation 3- 4   Knee flexion 3 4 4   Knee extension 3 4 4   Ankle dorsiflexion     Ankle plantarflexion     Ankle inversion     Ankle eversion      (Blank rows = not tested)  LOWER EXTREMITY SPECIAL TESTS:  Knee special tests: Patellafemoral apprehension test: positive   FUNCTIONAL TESTS:  5 times sit to stand: 40.6 2 minute walk test: TBA  04/03/24: 287 feet 2 MWT  GAIT: Distance walked: 200' Assistive device utilized: Single point cane Level of assistance: Modified independence Comments: Decreased pace                                                                                                                       TREATMENT DATE:  [facility] Adult PT Treatment:                                                DATE: 04/27/24 Therapeutic Activities: Nustep L6 UE/LE x 5 minutes  Heel raises  Standing hip abduction 2 x 10 each  5xSTS, 27.5 s use of hands 4 inch step up with 1 UE x 15 2 lateral step up 1 UE x15 SLR 2x10 Knee flexion red band x 20 Morgan x 12  Blue band clam supine x 15  [facility] Adult PT Treatment:                                                DATE: 04/24/24  Nustep L6 UE/LE x 5 minutes  Heel raises  Standing hip abduction 2 x 10 each  Standing hip flexion 2 x 10  STS x 12 4 inch step up with 1 UE x 10 LAQ 3# x 20  Knee flexion red band x 20 Morgan x 15  Blue  band clam supine x 15 Banded Morgan x 10   [facility] Adult PT Treatment:                                                DATE: 04/20/24 Nustep L3 x 5 minutes UE/LE Quad sets x10 5  SLR c QS x10  Morgan x 15 SL Blue band clam GTB 2x 10 each Supine kne2 flexion c swiss ball 2x10 LAQ 2x 10   PATIENT EDUCATION:  Education details: Eval findings, POC, HEP, self care  Person educated: Patient Education method: Explanation, Demonstration, Tactile  cues, Verbal cues, and Handouts Education comprehension: verbalized understanding, returned demonstration, verbal cues required, and tactile cues required  HOME EXERCISE PROGRAM: Access Code: [email or web address] URL: [email or web address] Date: 04/12/2024 Prepared by: Dasie Daft  Exercises - Supine Quad Set  - 2 x daily - 7 x weekly - 1 sets - 10 reps - 5 hold - Active Straight Leg Raise with Quad Set  - 2 x daily - 7 x weekly - 1 sets - 10 reps - 3 hold - Supine Morgan  - 2 x daily - 7 x weekly - 1 sets - 10 reps - 3 hold - Morgan with Arms at [employer] and Feet on [employer]  - 2 x daily - 7 x weekly - 1 sets - 10 reps - 3 hold - Hooklying Isometric Clamshell  - 2 x daily - 7 x weekly - 1 sets - 10 reps - 2 hold - Seated Long Arc Quad  - 1 x daily - 7 x weekly - 1 sets - 10 reps - 3 hold - Sit to Stand Without Arm Support  - 1 x daily - 7 x weekly - 1 sets - 10 reps  ASSESSMENT:  CLINICAL IMPRESSION: PT was completed for R knee/LE strengthening. Pt est 40% improvement since the start of PT. Reassessed 5xSTS and it was found improved meeting this goal. Pt reported sharp R knee pain with knee flexion when moving from sitting to supine which resolved over the PT session. Pt demonstrates good understanding of her HEP. Pt was able to continue with CKC exs today tolerating without adverse effects. Pt will continue to benefit from skilled PT to address impairments for improved function    EVAL: Patient is a 63 y.o. female who was seen today for physical  therapy evaluation and treatment for M17.11 (ICD-10-CM) - Primary osteoarthritis of right knee . Pt presents with decreased strength of the R LE, decreased R knee flexion, and decreased function as noted with the 5xSTS and her decreased pace for ambulation. A HEP was started to address deficits. Pt will benefit from skilled PT 2w8 to address impairments to optimize R knee/LE function with less pain.   OBJECTIVE IMPAIRMENTS: decreased balance, decreased mobility, difficulty walking, decreased ROM, decreased strength, obesity, and pain.   ACTIVITY LIMITATIONS: carrying, lifting, bending, sitting, standing, squatting, sleeping, stairs, bathing, dressing, hygiene/grooming, locomotion level, and caring for others  PARTICIPATION LIMITATIONS: meal prep, cleaning, laundry, shopping, and community activity  PERSONAL FACTORS: Fitness, Past/current experiences, Time since onset of injury/illness/exacerbation, and 1-2 comorbidities: high BMI, DM are also affecting patient's functional outcome.   REHAB POTENTIAL: Good  CLINICAL DECISION MAKING: Stable/uncomplicated  EVALUATION COMPLEXITY: Low   GOALS:  SHORT TERM GOALS: Target date: 04/21/24 Pt will be Ind in an initial HEP  Baseline:started Goal status: MET  2.  Assess Baseline: 287 feet Goal status: MET    LONG TERM GOALS: Target date: 06/02/24  Pt will be Ind in a final HEP to maintain achieved LOF  Baseline:  Goal status: INITIAL  2.  Increase R knee flexion to 105d for improved function with sitting and steps Baseline: 95d Goal status: met  3.  Increase R knee and hip strength to 3+ or greater for improved functional use of the R LE Baseline: 3- to 3 Goal status: PARTIALLY MET  4.  Improve 5xSTS by MCID of 5 and by MCID of 59ft as indication of improved functional mobility  Baseline: 40.6' c armrests, TBA 04/03/24: 2 MWT 287 feet  04/27/24: 5xSTS 27.5 Goal status: ONGOING   5.  Pt's LEFS score will improvve by  the MCID to 54%% as indication of improved function  Baseline: 44% Goal status: INITIAL  6.  Pt will report 25% or greater improvement in her R knee pain for improved function and QOL Baseline: 4-8/10 04/27/24: pt est 40% improvement in R knee pain Goal status: IMPROVING   PLAN:  PT FREQUENCY: 2x/week  PT DURATION: 8 weeks  PLANNED INTERVENTIONS: [id number]- PT Re-evaluation, 97110-Therapeutic exercises, 97530- Therapeutic activity, 97112- Neuromuscular re-education, 97535- Self Care, [id number]- Manual therapy, [id number]- Gait training, [phone]- Aquatic Therapy, [phone]- Vasopneumatic device, [phone]- Ionotophoresis 4mg /ml Dexamethasone, [id number] (1-2 muscles), [id number] (3+ muscles)- Dry Needling, Patient/Family education, Balance training, Stair training, Taping, Joint mobilization, Cryotherapy, and Moist heat  PLAN FOR NEXT SESSION:  assess response to HEP; progress therex as indicated; use of modalities, manual therapy; and TPDN as indicated.  Buckley Bradly MS, PT 04/27/24 11:05 AM

## 2024-04-27 ENCOUNTER — Ambulatory Visit

## 2024-04-27 DIAGNOSIS — G8929 Other chronic pain: Secondary | ICD-10-CM

## 2024-04-27 DIAGNOSIS — M6281 Muscle weakness (generalized): Secondary | ICD-10-CM

## 2024-04-27 DIAGNOSIS — M25561 Pain in right knee: Secondary | ICD-10-CM | POA: Diagnosis not present

## 2024-04-27 DIAGNOSIS — R262 Difficulty in walking, not elsewhere classified: Secondary | ICD-10-CM

## 2024-05-04 NOTE — Therapy (Signed)
 OUTPATIENT PHYSICAL THERAPY LOWER EXTREMITY TREATMENT   Patient Name: [name] MRN: [id number] DOB:[birth year], 63 y.o., female Today's Date: 05/05/2024  END OF SESSION:  PT End of Session - 05/05/24 1022     Visit Number 9    Number of Visits 17    Date for PT Re-Evaluation 06/02/24    Authorization Type [place] [employer]ICA[employer]    Authorization - Visit Number 9    Authorization - Number of Visits 29    PT Start Time 1021    PT Stop Time 1100    PT Time Calculation (min) 39 min    Activity Tolerance Patient tolerated treatment well    Behavior During Therapy WFL for tasks assessed/performed               Past Medical History:  Diagnosis Date   Anemia    Anxiety    Arthritis    Back pain    Chest pain    Diabetes mellitus without complication (HCC)    recent dx diet controlled   DVT (deep venous thrombosis) (HCC)    remote - took coumadin    Dysmenorrhea    Dysrhythmia    irregular heart beat   Edema    Fatty liver    Fibroid    GERD (gastroesophageal reflux disease)    hx of   Gout    Hypercholesteremia    Hypertension 4 months   Hypothyroid    Joint pain    Menometrorrhagia    Morbid obesity (HCC)    Osteoarthritis    Palpitations    Prediabetes    Substance abuse (HCC)    Vitamin D  deficiency    Past Surgical History:  Procedure Laterality Date   ABDOMINAL HYSTERECTOMY  2008   BREAST BIOPSY Left 2013   benign   BUNIONECTOMY Bilateral    CHOLECYSTECTOMY     COLONOSCOPY WITH PROPOFOL  N/A 10/27/2018   Procedure: COLONOSCOPY WITH PROPOFOL ;  Surgeon: Lennard Lesta FALCON, MD;  Location: [facility];  Service: Endoscopy;  Laterality: N/A;   ECTOPIC PREGNANCY SURGERY  1980's   MANDIBLE OSTEOTOMY Bilateral 07/03/2013   Procedure: BILATERAL TORI;  Surgeon: Glendia CHRISTELLA Primrose, DDS;  Location: [facility];  Service: Oral Surgery;  Laterality: Bilateral;   NM MYOCAR PERF WALL MOTION  07/2012   lexiscan  - normal pattern of perfusion, low risk    ROTATOR CUFF REPAIR Bilateral 2009   SLEEP STUDY  07/27/2012   AHI 4.8/hr   TOOTH EXTRACTION N/A 07/03/2013   Procedure: DENTAL EXTRACTION # 20;  Surgeon: Glendia CHRISTELLA Primrose, DDS;  Location: [facility];  Service: Oral Surgery;  Laterality: N/A;   TRANSTHORACIC ECHOCARDIOGRAM  07/2012   EF=>55%, mod conc LVH; LA mod dilated; trace MR; mild TR with normal RVSP; trace AV regurg   TUBAL LIGATION     Patient Active Problem List   Diagnosis Date Noted   Prediabetes 02/22/2023   Primary osteoarthritis of both hips 12/15/2022   Central adiposity 10/20/2022   Vitamin D  deficiency 08/11/2022   Other fatigue 07/29/2022   SOBOE (shortness of breath on exertion) 07/29/2022   Osteoarthritis of left hip 07/29/2022   Other hyperlipidemia 07/29/2022   Depression screening 07/29/2022   Paresthesia 06/30/2022   Peripheral neuropathy 05/13/2022   Pain in left wrist 04/24/2022   Carpal tunnel syndrome, bilateral 03/18/2022   Plantar fasciitis of right foot 09/03/2021   Pes planus 09/03/2021   Nuclear sclerotic cataract of both eyes 05/27/2021   Diabetes mellitus without complication (HCC)  05/27/2021   Vitreomacular adhesion of both eyes 05/27/2021   Antiphospholipid antibody positive 05/21/2021   Pain in left elbow 02/19/2021   Chronic left-sided low back pain with left-sided sciatica 07/22/2017   Thyroid  nodule 04/16/2017   Essential hypertension 03/13/2014   Menometrorrhagia    Dysmenorrhea    Anemia    Hyperlipidemia 05/23/2007   OBESITY, MORBID with starting BMI 45 05/23/2007   DYSFUNCTIONAL UTERINE BLEEDING - s/p LAVH 05/23/2007   ROTATOR CUFF SYNDROME 05/23/2007   KNEE PAIN, HX OF 05/23/2007    PCP: Joshua Francisco, MD  REFERRING PROVIDER: Jerri Kay HERO, MD  REFERRING DIAG: M17.11 (ICD-10-CM) - Primary osteoarthritis of right knee   THERAPY DIAG:  Chronic pain of right knee  Muscle weakness (generalized)  Difficulty in walking, not elsewhere classified  Rationale for Evaluation and  Treatment: Rehabilitation  ONSET DATE: 3 weeks ago  SUBJECTIVE:   SUBJECTIVE STATEMENT: Pt reports her r knee did well going to the pool with her grandchildren  EVAL: Pt reports about 3 weeks ago she was walking fast and her R knee cap slipped out of place to the outside and she needed to walk straight legged. She experienced significant R knee pain at that time. She was told by Dr. Jerri she has bone on bone OA. Pt notes she has issues with her L hip as well and is to have a L THA sometime in the fall or the beginning of th next year. She states she needs to lose 10 more lbs for the surgery to be scheduled.  PERTINENT HISTORY: High BMI, DM, knee pain  PAIN:  Are you having pain? Yes: NPRS scale: 3/10 Pain location: R knee Pain description: ache Aggravating factors: prolonged walking and standing, steps - 1 at a time up with the L Relieving factors: pain meds, lidocaine  cream  PRECAUTIONS: None  RED FLAGS: None   WEIGHT BEARING RESTRICTIONS: No  FALLS:  Has patient fallen in last 6 months? No A few near falls with the R knee partially giving out  LIVING ENVIRONMENT: Lives with: lives with their family Lives in: House/apartment Stairs: Yes: External: 2 steps; bilateral but cannot reach both Has following equipment at home: Single point cane  OCCUPATION: Disability  PLOF: Independent with community mobility with device  PATIENT GOALS: Strengthening of her R leg  NEXT MD VISIT: Not scheduled  OBJECTIVE:  Note: Objective measures were completed at Evaluation unless otherwise noted.  DIAGNOSTIC FINDINGS: X-rays demonstrate severe osteoarthritis.  Bone-on-bone joint space  narrowing.   PATIENT SURVEYS:  LEFS: 35/80= 44%  COGNITION: Overall cognitive status: Within functional limits for tasks assessed     SENSATION: WFL  EDEMA:  Swelling of the R knee palapated  MUSCLE LENGTH: Hamstrings: Right tight deg; Left tight deg Thomas test: Right tight deg; Left tight  deg  POSTURE: increased lumbar lordosis and anterior pelvic tilt  PALPATION: TTP to the lateral joint space  LOWER EXTREMITY ROM:  Active ROM Right eval Left eval Right 04/03/24 RT 04/12/24  Hip flexion      Hip extension      Hip abduction      Hip adduction      Hip internal rotation      Hip external rotation      Knee flexion 95 110 102 110d  Knee extension 0 0    Ankle dorsiflexion      Ankle plantarflexion      Ankle inversion      Ankle eversion       (  Blank rows = not tested)  LOWER EXTREMITY MMT:  MMT Right eval Left eval Right  04/16/24 Rt 05/05/24  Hip flexion 3- 4  4+  Hip extension 3- 3    Hip abduction 3- 4  4+  Hip adduction      Hip internal rotation      Hip external rotation 3- 4    Knee flexion 3 4 4  4+  Knee extension 3 4 4  4+  Ankle dorsiflexion      Ankle plantarflexion      Ankle inversion      Ankle eversion       (Blank rows = not tested)  LOWER EXTREMITY SPECIAL TESTS:  Knee special tests: Patellafemoral apprehension test: positive   FUNCTIONAL TESTS:  5 times sit to stand: 40.6 2 minute walk test: TBA  04/03/24: 287 feet 2 MWT  GAIT: Distance walked: 200' Assistive device utilized: Single point cane Level of assistance: Modified independence Comments: Decreased pace                                                                                                                       TREATMENT DATE:  [facility] Adult PT Treatment:                                                DATE: 05/05/24 Therapeutic Activities: LAQ 2x10 5# 3 SLR x15 3# 3 Heel raises x15 Knee flexion red band x 20 STS x10  on airex Standing hip abd on air x10 3# each Lateral side step to airex x15each  [facility] Adult PT Treatment:                                                DATE: 04/27/24 Therapeutic Activities: Nustep L6 UE/LE x 5 minutes  Heel raises  Standing hip abduction 2 x 10 each  5xSTS, 27.5 s use of hands 4 inch step up with 1 UE x 15 2 lateral  step up 1 UE x15 SLR 2x10 Knee flexion red band x 20 Bridge x 12  Blue band clam supine x 15  [facility] Adult PT Treatment:                                                DATE: 04/24/24  Nustep L6 UE/LE x 5 minutes  Heel raises  Standing hip abduction 2 x 10 each  Standing hip flexion 2 x 10  STS x 12 4 inch step up with 1 UE x 10 LAQ 3# x 20  Knee flexion red band x 20 Bridge x 15  Blue band clam supine x 15 Banded bridge x 10   PATIENT EDUCATION:  Education details: Eval findings, POC, HEP, self care  Person educated: Patient Education method: Explanation, Demonstration, Tactile cues, Verbal cues, and Handouts Education comprehension: verbalized understanding, returned demonstration, verbal cues required, and tactile cues required  HOME EXERCISE PROGRAM: Access Code: [email or web address] URL: [email or web address] Date: 04/12/2024 Prepared by: Dasie Daft  Exercises - Supine Quad Set  - 2 x daily - 7 x weekly - 1 sets - 10 reps - 5 hold - Active Straight Leg Raise with Quad Set  - 2 x daily - 7 x weekly - 1 sets - 10 reps - 3 hold - Supine Bridge  - 2 x daily - 7 x weekly - 1 sets - 10 reps - 3 hold - Bridge with Arms at [employer] and Feet on [employer]  - 2 x daily - 7 x weekly - 1 sets - 10 reps - 3 hold - Hooklying Isometric Clamshell  - 2 x daily - 7 x weekly - 1 sets - 10 reps - 2 hold - Seated Long Arc Quad  - 1 x daily - 7 x weekly - 1 sets - 10 reps - 3 hold - Sit to Stand Without Arm Support  - 1 x daily - 7 x weekly - 1 sets - 10 reps  ASSESSMENT:  CLINICAL IMPRESSION: Pt participated in PT for knee/LE strengthening to minimize pain and improve function. Pt reports the R knee pain is improved and sit t/f standing is getting easier. R knee and hip strength was reassessed and found much stronger meeting this goal. Pt continues to make good progress re: pain and function. Pt will continue to benefit from skilled PT to address impairments for improved function.  EVAL:  Patient is a 63 y.o. female who was seen today for physical therapy evaluation and treatment for M17.11 (ICD-10-CM) - Primary osteoarthritis of right knee . Pt presents with decreased strength of the R LE, decreased R knee flexion, and decreased function as noted with the 5xSTS and her decreased pace for ambulation. A HEP was started to address deficits. Pt will benefit from skilled PT 2w8 to address impairments to optimize R knee/LE function with less pain.   OBJECTIVE IMPAIRMENTS: decreased balance, decreased mobility, difficulty walking, decreased ROM, decreased strength, obesity, and pain.   ACTIVITY LIMITATIONS: carrying, lifting, bending, sitting, standing, squatting, sleeping, stairs, bathing, dressing, hygiene/grooming, locomotion level, and caring for others  PARTICIPATION LIMITATIONS: meal prep, cleaning, laundry, shopping, and community activity  PERSONAL FACTORS: Fitness, Past/current experiences, Time since onset of injury/illness/exacerbation, and 1-2 comorbidities: high BMI, DM are also affecting patient's functional outcome.   REHAB POTENTIAL: Good  CLINICAL DECISION MAKING: Stable/uncomplicated  EVALUATION COMPLEXITY: Low   GOALS:  SHORT TERM GOALS: Target date: 04/21/24 Pt will be Ind in an initial HEP  Baseline:started Goal status: MET  2.  Assess Baseline: 287 feet Goal status: MET    LONG TERM GOALS: Target date: 06/02/24  Pt will be Ind in a final HEP to maintain achieved LOF  Baseline:  Goal status: INITIAL  2.  Increase R knee flexion to 105d for improved function with sitting and steps Baseline: 95d Goal status: met  3.  Increase R knee and hip strength to 3+ or greater for improved functional use of the R LE Baseline: 3- to 3 05/05/24: see flow sheet Goal status: MET  4.  Improve 5xSTS by MCID of 5 and by MCID of 10ft  as indication of improved functional mobility  Baseline: 40.6' c armrests, TBA 04/03/24: 2 MWT 287 feet  04/27/24:  5xSTS 27.5 Goal status: ONGOING   5.  Pt's LEFS score will improvve by the MCID to 54%% as indication of improved function  Baseline: 44% Goal status: INITIAL  6.  Pt will report 25% or greater improvement in her R knee pain for improved function and QOL Baseline: 4-8/10 04/27/24: pt est 40% improvement in R knee pain Goal status: IMPROVING   PLAN:  PT FREQUENCY: 2x/week  PT DURATION: 8 weeks  PLANNED INTERVENTIONS: [id number]- PT Re-evaluation, 97110-Therapeutic exercises, 97530- Therapeutic activity, 97112- Neuromuscular re-education, 97535- Self Care, [id number]- Manual therapy, [id number]- Gait training, [phone]- Aquatic Therapy, [phone]- Vasopneumatic device, [phone]- Ionotophoresis 4mg /ml Dexamethasone, 79439 (1-2 muscles), [id number] (3+ muscles)- Dry Needling, Patient/Family education, Balance training, Stair training, Taping, Joint mobilization, Cryotherapy, and Moist heat  PLAN FOR NEXT SESSION:  assess response to HEP; progress therex as indicated; use of modalities, manual therapy; and TPDN as indicated.  Ko Bardon MS, PT 05/05/24 11:00 AM

## 2024-05-05 ENCOUNTER — Ambulatory Visit

## 2024-05-05 DIAGNOSIS — R262 Difficulty in walking, not elsewhere classified: Secondary | ICD-10-CM

## 2024-05-05 DIAGNOSIS — M6281 Muscle weakness (generalized): Secondary | ICD-10-CM

## 2024-05-05 DIAGNOSIS — M25561 Pain in right knee: Secondary | ICD-10-CM | POA: Diagnosis not present

## 2024-05-05 DIAGNOSIS — G8929 Other chronic pain: Secondary | ICD-10-CM

## 2024-05-08 NOTE — Therapy (Signed)
 OUTPATIENT PHYSICAL THERAPY LOWER EXTREMITY TREATMENT   Patient Name: [name] MRN: [id number] DOB:12/12/1960, 63 y.o., female Today's Date: 05/09/2024  END OF SESSION:  PT End of Session - 05/09/24 0931     Visit Number 10    Number of Visits 17    Date for PT Re-Evaluation 06/02/24    Authorization Type [place] MEDICAID [employer]    Authorization - Visit Number 10    Authorization - Number of Visits 29    PT Start Time [phone]    PT Stop Time 1009    PT Time Calculation (min) 38 min    Activity Tolerance Patient tolerated treatment well    Behavior During Therapy WFL for tasks assessed/performed                Past Medical History:  Diagnosis Date   Anemia    Anxiety    Arthritis    Back pain    Chest pain    Diabetes mellitus without complication (HCC)    recent dx diet controlled   DVT (deep venous thrombosis) (HCC)    remote - took coumadin    Dysmenorrhea    Dysrhythmia    irregular heart beat   Edema    Fatty liver    Fibroid    GERD (gastroesophageal reflux disease)    hx of   Gout    Hypercholesteremia    Hypertension 4 months   Hypothyroid    Joint pain    Menometrorrhagia    Morbid obesity (HCC)    Osteoarthritis    Palpitations    Prediabetes    Substance abuse (HCC)    Vitamin D  deficiency    Past Surgical History:  Procedure Laterality Date   ABDOMINAL HYSTERECTOMY  2008   BREAST BIOPSY Left 2013   benign   BUNIONECTOMY Bilateral    CHOLECYSTECTOMY     COLONOSCOPY WITH PROPOFOL  N/A 10/27/2018   Procedure: COLONOSCOPY WITH PROPOFOL ;  Surgeon: Lennard Lesta FALCON, MD;  Location: [facility];  Service: Endoscopy;  Laterality: N/A;   ECTOPIC PREGNANCY SURGERY  1980's   MANDIBLE OSTEOTOMY Bilateral 07/03/2013   Procedure: BILATERAL TORI;  Surgeon: Glendia CHRISTELLA Primrose, DDS;  Location: [facility] OR;  Service: Oral Surgery;  Laterality: Bilateral;   NM MYOCAR PERF WALL MOTION  07/2012   lexiscan  - normal pattern of perfusion, low  risk   ROTATOR CUFF REPAIR Bilateral 2009   SLEEP STUDY  07/27/2012   AHI 4.8/hr   TOOTH EXTRACTION N/A 07/03/2013   Procedure: DENTAL EXTRACTION # 20;  Surgeon: Glendia CHRISTELLA Primrose, DDS;  Location: [facility] OR;  Service: Oral Surgery;  Laterality: N/A;   TRANSTHORACIC ECHOCARDIOGRAM  07/2012   EF=>55%, mod conc LVH; LA mod dilated; trace MR; mild TR with normal RVSP; trace AV regurg   TUBAL LIGATION     Patient Active Problem List   Diagnosis Date Noted   Prediabetes 02/22/2023   Primary osteoarthritis of both hips 12/15/2022   Central adiposity 10/20/2022   Vitamin D  deficiency 08/11/2022   Other fatigue 07/29/2022   SOBOE (shortness of breath on exertion) 07/29/2022   Osteoarthritis of left hip 07/29/2022   Other hyperlipidemia 07/29/2022   Depression screening 07/29/2022   Paresthesia 06/30/2022   Peripheral neuropathy 05/13/2022   Pain in left wrist 04/24/2022   Carpal tunnel syndrome, bilateral 03/18/2022   Plantar fasciitis of right foot 09/03/2021   Pes planus 09/03/2021   Nuclear sclerotic cataract of both eyes 05/27/2021   Diabetes mellitus without complication (  HCC) 05/27/2021   Vitreomacular adhesion of both eyes 05/27/2021   Antiphospholipid antibody positive 05/21/2021   Pain in left elbow 02/19/2021   Chronic left-sided low back pain with left-sided sciatica 07/22/2017   Thyroid  nodule 04/16/2017   Essential hypertension 03/13/2014   Menometrorrhagia    Dysmenorrhea    Anemia    Hyperlipidemia 05/23/2007   OBESITY, MORBID with starting BMI 45 05/23/2007   DYSFUNCTIONAL UTERINE BLEEDING - s/p LAVH 05/23/2007   ROTATOR CUFF SYNDROME 05/23/2007   KNEE PAIN, HX OF 05/23/2007    PCP: Joshua Francisco, MD  REFERRING PROVIDER: Jerri Kay HERO, MD  REFERRING DIAG: M17.11 (ICD-10-CM) - Primary osteoarthritis of right knee   THERAPY DIAG:  Chronic pain of right knee  Muscle weakness (generalized)  Difficulty in walking, not elsewhere classified  Rationale for Evaluation  and Treatment: Rehabilitation  ONSET DATE: 3 weeks ago  SUBJECTIVE:   SUBJECTIVE STATEMENT: Pt reports her low back has been bothering her more the past 3 days. Pt is not sure why it has increased, but it may be related to increased activity with grandkids at the pool. Pt states she has had a chronic Hx of low back pain.  EVAL: Pt reports about 3 weeks ago she was walking fast and her R knee cap slipped out of place to the outside and she needed to walk straight legged. She experienced significant R knee pain at that time. She was told by Dr. Jerri she has bone on bone OA. Pt notes she has issues with her L hip as well and is to have a L THA sometime in the fall or the beginning of th next year. She states she needs to lose 10 more lbs for the surgery to be scheduled.  PERTINENT HISTORY: High BMI, DM, knee pain  PAIN:  Are you having pain? Yes: NPRS scale: 2/10 Pain location: R knee Pain description: ache Aggravating factors: prolonged walking and standing, steps - 1 at a time up with the L Relieving factors: pain meds, lidocaine  cream  PRECAUTIONS: None  RED FLAGS: None   WEIGHT BEARING RESTRICTIONS: No  FALLS:  Has patient fallen in last 6 months? No A few near falls with the R knee partially giving out  LIVING ENVIRONMENT: Lives with: lives with their family Lives in: House/apartment Stairs: Yes: External: 2 steps; bilateral but cannot reach both Has following equipment at home: Single point cane  OCCUPATION: Disability  PLOF: Independent with community mobility with device  PATIENT GOALS: Strengthening of her R leg  NEXT MD VISIT: Not scheduled  OBJECTIVE:  Note: Objective measures were completed at Evaluation unless otherwise noted.  DIAGNOSTIC FINDINGS: X-rays demonstrate severe osteoarthritis.  Bone-on-bone joint space  narrowing.   PATIENT SURVEYS:  LEFS: 35/80= 44%  COGNITION: Overall cognitive status: Within functional limits for tasks  assessed     SENSATION: WFL  EDEMA:  Swelling of the R knee palapated  MUSCLE LENGTH: Hamstrings: Right tight deg; Left tight deg Thomas test: Right tight deg; Left tight deg  POSTURE: increased lumbar lordosis and anterior pelvic tilt  PALPATION: TTP to the lateral joint space  LOWER EXTREMITY ROM:  Active ROM Right eval Left eval Right 04/03/24 RT 04/12/24  Hip flexion      Hip extension      Hip abduction      Hip adduction      Hip internal rotation      Hip external rotation      Knee flexion 95 110 102 110d  Knee extension 0 0    Ankle dorsiflexion      Ankle plantarflexion      Ankle inversion      Ankle eversion       (Blank rows = not tested)  LOWER EXTREMITY MMT:  MMT Right eval Left eval Right  04/16/24 Rt 05/05/24  Hip flexion 3- 4  4+  Hip extension 3- 3    Hip abduction 3- 4  4+  Hip adduction      Hip internal rotation      Hip external rotation 3- 4    Knee flexion 3 4 4  4+  Knee extension 3 4 4  4+  Ankle dorsiflexion      Ankle plantarflexion      Ankle inversion      Ankle eversion       (Blank rows = not tested)  LOWER EXTREMITY SPECIAL TESTS:  Knee special tests: Patellafemoral apprehension test: positive   FUNCTIONAL TESTS:  5 times sit to stand: 40.6 2 minute walk test: TBA  04/03/24: 287 feet 2 MWT  GAIT: Distance walked: 200' Assistive device utilized: Single point cane Level of assistance: Modified independence Comments: Decreased pace                                                                                                                       TREATMENT DATE:  [facility] Adult PT Treatment:                                                DATE: 05/08/24 Therapeutic Activities: LAQ 2x10 5 Seated knee lifts x15 each  Seated Heel raises x15 Seated hip abd x15 GTB Knee flexion red band 2x15 each Modalities: MH to low back to assist pt in participating in exs 20 mins  [facility] Adult PT Treatment:                                                 DATE: 05/05/24 Therapeutic Activities: LAQ 2x10 5# 3 SLR x15 3# 3 Heel raises x15 Knee flexion red band x 20 STS x10  on airex Standing hip abd on air x10 3# each Lateral side step to airex x15each  [facility] Adult PT Treatment:                                                DATE: 04/27/24 Therapeutic Activities: Nustep L6 UE/LE x 5 minutes  Heel raises  Standing hip abduction 2 x 10 each  5xSTS, 27.5 s use of hands 4 inch step up with 1 UE x 15 2  lateral step up 1 UE x15 SLR 2x10 Knee flexion red band x 20 Bridge x 12  Blue band clam supine x 15  PATIENT EDUCATION:  Education details: Eval findings, POC, HEP, self care  Person educated: Patient Education method: Explanation, Demonstration, Tactile cues, Verbal cues, and Handouts Education comprehension: verbalized understanding, returned demonstration, verbal cues required, and tactile cues required  HOME EXERCISE PROGRAM: Access Code: [email or web address] URL: [email or web address] Date: 04/12/2024 Prepared by: Dasie Daft  Exercises - Supine Quad Set  - 2 x daily - 7 x weekly - 1 sets - 10 reps - 5 hold - Active Straight Leg Raise with Quad Set  - 2 x daily - 7 x weekly - 1 sets - 10 reps - 3 hold - Supine Bridge  - 2 x daily - 7 x weekly - 1 sets - 10 reps - 3 hold - Bridge with Arms at [employer] and Feet on [employer]  - 2 x daily - 7 x weekly - 1 sets - 10 reps - 3 hold - Hooklying Isometric Clamshell  - 2 x daily - 7 x weekly - 1 sets - 10 reps - 2 hold - Seated Long Arc Quad  - 1 x daily - 7 x weekly - 1 sets - 10 reps - 3 hold - Sit to Stand Without Arm Support  - 1 x daily - 7 x weekly - 1 sets - 10 reps  ASSESSMENT:  CLINICAL IMPRESSION: Pt's PT today was completed in sitting with pt not tolerating lying supine when her low back is bothering her. Pt presented to PT wearing and lumbar brace and walking with a SPC. Seated LE strengthening were completed c moist heat to the low back to help her  participate in exs with her experiencing increased low back pain. Pt tolerated PT today without adverse effects. Will increase demand of PT and reassess LTGs the next session as pt is able.   EVAL: Patient is a 63 y.o. female who was seen today for physical therapy evaluation and treatment for M17.11 (ICD-10-CM) - Primary osteoarthritis of right knee . Pt presents with decreased strength of the R LE, decreased R knee flexion, and decreased function as noted with the 5xSTS and her decreased pace for ambulation. A HEP was started to address deficits. Pt will benefit from skilled PT 2w8 to address impairments to optimize R knee/LE function with less pain.   OBJECTIVE IMPAIRMENTS: decreased balance, decreased mobility, difficulty walking, decreased ROM, decreased strength, obesity, and pain.   ACTIVITY LIMITATIONS: carrying, lifting, bending, sitting, standing, squatting, sleeping, stairs, bathing, dressing, hygiene/grooming, locomotion level, and caring for others  PARTICIPATION LIMITATIONS: meal prep, cleaning, laundry, shopping, and community activity  PERSONAL FACTORS: Fitness, Past/current experiences, Time since onset of injury/illness/exacerbation, and 1-2 comorbidities: high BMI, DM are also affecting patient's functional outcome.   REHAB POTENTIAL: Good  CLINICAL DECISION MAKING: Stable/uncomplicated  EVALUATION COMPLEXITY: Low   GOALS:  SHORT TERM GOALS: Target date: 04/21/24 Pt will be Ind in an initial HEP  Baseline:started Goal status: MET  2.  Assess Baseline: 287 feet Goal status: MET    LONG TERM GOALS: Target date: 06/02/24  Pt will be Ind in a final HEP to maintain achieved LOF  Baseline:  Goal status: INITIAL  2.  Increase R knee flexion to 105d for improved function with sitting and steps Baseline: 95d Goal status: met  3.  Increase R knee and hip strength to 3+ or greater for improved functional use of the  R LE Baseline: 3- to 3 05/05/24: see flow  sheet Goal status: MET  4.  Improve 5xSTS by MCID of 5 and by MCID of 43ft as indication of improved functional mobility  Baseline: 40.6' c armrests, TBA 04/03/24: 2 MWT 287 feet  04/27/24: 5xSTS 27.5 Goal status: ONGOING   5.  Pt's LEFS score will improvve by the MCID to 54%% as indication of improved function  Baseline: 44% Goal status: INITIAL  6.  Pt will report 25% or greater improvement in her R knee pain for improved function and QOL Baseline: 4-8/10 04/27/24: pt est 40% improvement in R knee pain Goal status: IMPROVING   PLAN:  PT FREQUENCY: 2x/week  PT DURATION: 8 weeks  PLANNED INTERVENTIONS: 97164- PT Re-evaluation, 97110-Therapeutic exercises, 97530- Therapeutic activity, 97112- Neuromuscular re-education, 97535- Self Care, 02859- Manual therapy, [id number]- Gait training, [phone]- Aquatic Therapy, [phone]- Vasopneumatic device, [phone]- Ionotophoresis 4mg /ml Dexamethasone, 79439 (1-2 muscles), 20561 (3+ muscles)- Dry Needling, Patient/Family education, Balance training, Stair training, Taping, Joint mobilization, Cryotherapy, and Moist heat  PLAN FOR NEXT SESSION:  assess response to HEP; progress therex as indicated; use of modalities, manual therapy; and TPDN as indicated.  Joci Dress MS, PT 05/09/24 10:13 AM

## 2024-05-09 ENCOUNTER — Ambulatory Visit

## 2024-05-09 DIAGNOSIS — R262 Difficulty in walking, not elsewhere classified: Secondary | ICD-10-CM

## 2024-05-09 DIAGNOSIS — M25561 Pain in right knee: Secondary | ICD-10-CM | POA: Diagnosis not present

## 2024-05-09 DIAGNOSIS — M6281 Muscle weakness (generalized): Secondary | ICD-10-CM

## 2024-05-09 DIAGNOSIS — G8929 Other chronic pain: Secondary | ICD-10-CM

## 2024-05-10 ENCOUNTER — Other Ambulatory Visit: Payer: Self-pay

## 2024-05-10 ENCOUNTER — Emergency Department (HOSPITAL_COMMUNITY)
Admission: EM | Admit: 2024-05-10 | Discharge: 2024-05-10 | Disposition: A | Discharge status: Home / Self Care | Attending: Emergency Medicine | Admitting: Emergency Medicine

## 2024-05-10 DIAGNOSIS — Z7982 Long term (current) use of aspirin: Secondary | ICD-10-CM | POA: Insufficient documentation

## 2024-05-10 DIAGNOSIS — G8929 Other chronic pain: Secondary | ICD-10-CM

## 2024-05-10 DIAGNOSIS — M545 Low back pain, unspecified: Secondary | ICD-10-CM | POA: Diagnosis present

## 2024-05-10 LAB — URINALYSIS, W/ REFLEX TO CULTURE (INFECTION SUSPECTED)
Bilirubin Urine: NEGATIVE
Glucose, UA: NEGATIVE mg/dL
Hgb urine dipstick: NEGATIVE
Ketones, ur: NEGATIVE mg/dL
Leukocytes,Ua: NEGATIVE
Nitrite: NEGATIVE
Protein, ur: NEGATIVE mg/dL
Specific Gravity, Urine: 1.009 (ref 1.005–1.030)
pH: 5 (ref 5.0–8.0)

## 2024-05-10 NOTE — ED Notes (Signed)
Pt ambulatory to bathroom for urine sample.

## 2024-05-10 NOTE — ED Triage Notes (Addendum)
 Patient to ED by POV with c/o back pain. She voices HX of chronic back pain no injury causing pain. Lower back pain does not radiate. She see pain management for back pain and takes meds for pain but is not getting relief. Denies numbness, tingling in BLE.

## 2024-05-10 NOTE — ED Provider Notes (Signed)
 [place] EMERGENCY DEPARTMENT AT [facility] Provider Note   CSN: [id number] Arrival date & time: 05/10/24  1004     Patient presents with: Back Pain   [name] is a 63 y.o. female.   63 year old female presents with 3-day history of atraumatic lower back pain.  Patient has a history of chronic back pain and is in pain management.  States that she has not had any urinary symptoms but thinks she may have a UTI.  Denies any vaginal bleeding.  No fever or chills.  No nausea or vomiting.  Pain does not radiate down her legs.  No bowel or bladder dysfunction.  States compliant with her home medications       Prior to Admission medications   Medication Sig Start Date End Date Taking? Authorizing Provider  allopurinol (ZYLOPRIM) 100 MG tablet Take 100 mg by mouth daily.    [provider]  aspirin  81 MG chewable tablet Chew 81 mg by mouth daily.    [provider]  atorvastatin (LIPITOR) 40 MG tablet Take 40 mg by mouth daily.    [provider]  Blood Glucose Monitoring Suppl (ACCU-CHEK GUIDE ME) w/Device KIT  09/09/20   [provider]  cyclobenzaprine (FLEXERIL) 10 MG tablet Take 10 mg by mouth 2 (two) times daily as needed for muscle spasms.     [provider]  diclofenac  sodium (VOLTAREN ) 1 % GEL Apply 4 g topically 4 (four) times daily. 12/22/17   Maczis, Michael M, PA-C  furosemide (LASIX) 20 MG tablet Take 20 mg by mouth daily.    [provider]  GAVILYTE-N WITH FLAVOR PACK 420 g solution Take by mouth. Patient not taking: Reported on 04/18/2024 01/20/24   [provider]  glucose blood test strip TEST GLUCOSE TWICE A DAY 04/05/17   [provider]  levothyroxine (SYNTHROID) 75 MCG tablet Take 1 tablet by mouth daily. 07/17/22   [provider]  lidocaine  (XYLOCAINE ) 5 % ointment Apply 1 application topically as needed for moderate pain.    [provider]  lisinopril  (ZESTRIL) 5 MG tablet Take 1 tablet by mouth daily. 03/26/22   [provider]  MELATONIN PO Take by mouth at bedtime as needed.    [provider]  metoprolol  succinate (TOPROL -XL) 25 MG 24 hr tablet Take 1 tablet (25 mg total) by mouth daily. 04/29/23   Midge Sober, DO  NARCAN 4 MG/0.1ML LIQD nasal spray kit SMARTSIG:1 Spray(s) Both Nares Once PRN 04/08/20   [provider]  oxyCODONE -acetaminophen  (PERCOCET) 10-325 MG tablet Take 1 tablet by mouth 4 (four) times daily.     [provider]  potassium chloride  SA (K-DUR,KLOR-CON ) 20 MEQ tablet Take 20 mEq by mouth daily.    [provider]  Semaglutide , 1 MG/DOSE, 4 MG/3ML SOPN Inject 1 mg as directed once a week. 04/18/24   Opalski, Sober, DO  UNABLE TO FIND One tablet Orally once a day    [provider]  UNABLE TO FIND Take 50 mg by mouth.    [provider]  Vitamin D , Ergocalciferol , (DRISDOL ) 1.25 MG (50000 UNIT) CAPS capsule 1 po q 21 days 04/18/24   Midge Sober, DO    Allergies: Buprenorphine hcl-naloxone hcl, Naloxone, and Dilaudid [hydromorphone hcl]    Review of Systems  All other systems reviewed and are negative.   Updated Vital Signs BP 135/72 (BP Location: Right Arm)   Pulse 60   Temp 98.3 F (36.8 C) (  Oral)   Resp 16   Ht 1.676 m (5' 6)   Wt 69.9 kg   SpO2 100%   BMI 24.86 kg/m   Physical Exam Vitals and nursing note reviewed.  Constitutional:      General: She is not in acute distress.    Appearance: Normal appearance. She is well-developed. She is not toxic-appearing.  HENT:     Head: Normocephalic and atraumatic.  Eyes:     General: Lids are normal.     Conjunctiva/sclera: Conjunctivae normal.     Pupils: Pupils are equal, round, and reactive to light.  Neck:     Thyroid : No thyroid  mass.     Trachea: No tracheal deviation.  Cardiovascular:     Rate and Rhythm: Normal rate and regular rhythm.     Heart sounds: Normal heart sounds. No  murmur heard.    No gallop.  Pulmonary:     Effort: Pulmonary effort is normal. No respiratory distress.     Breath sounds: Normal breath sounds. No stridor. No decreased breath sounds, wheezing, rhonchi or rales.  Abdominal:     General: There is no distension.     Palpations: Abdomen is soft.     Tenderness: There is no abdominal tenderness. There is no rebound.  Musculoskeletal:        General: No tenderness. Normal range of motion.     Cervical back: Normal range of motion and neck supple.       Back:  Skin:    General: Skin is warm and dry.     Findings: No abrasion or rash.  Neurological:     Mental Status: She is alert and oriented to person, place, and time. Mental status is at baseline.     GCS: GCS eye subscore is 4. GCS verbal subscore is 5. GCS motor subscore is 6.     Cranial Nerves: No cranial nerve deficit.     Sensory: No sensory deficit.     Motor: Motor function is intact.     Comments: Strength 5/5 bilateral lower extremities  Psychiatric:        Attention and Perception: Attention normal.        Speech: Speech normal.        Behavior: Behavior normal.     (all labs ordered are listed, but only abnormal results are displayed) Labs Reviewed  URINALYSIS, W/ REFLEX TO CULTURE (INFECTION SUSPECTED)    EKG: None  Radiology: No results found.   Procedures   Medications Ordered in the ED - No data to display                                  Medical Decision Making  Urinalysis negative for infection.  Suspect this is more patient's chronic back pain.  Will discharge home     Final diagnoses:  None    ED Discharge Orders     None          Dasie Faden, MD 05/10/24 1356

## 2024-05-11 ENCOUNTER — Ambulatory Visit: Admitting: Physical Therapy

## 2024-05-12 ENCOUNTER — Telehealth: Payer: Self-pay

## 2024-05-12 NOTE — Telephone Encounter (Signed)
 Patient called to schedule ov for LBP. I returned phone call but we were disconnected. Scheduled appt for 8/8.

## 2024-05-15 ENCOUNTER — Other Ambulatory Visit (INDEPENDENT_AMBULATORY_CARE_PROVIDER_SITE_OTHER): Payer: Self-pay | Admitting: Family Medicine

## 2024-05-16 ENCOUNTER — Ambulatory Visit (INDEPENDENT_AMBULATORY_CARE_PROVIDER_SITE_OTHER): Admitting: Family Medicine

## 2024-05-17 NOTE — Therapy (Incomplete)
 OUTPATIENT PHYSICAL THERAPY LOWER EXTREMITY TREATMENT   Patient Name: [name] MRN: [id number] DOB:09-12-1961, 63 y.o., female Today's Date: 05/17/2024  END OF SESSION:          Past Medical History:  Diagnosis Date   Anemia    Anxiety    Arthritis    Back pain    Chest pain    Diabetes mellitus without complication (HCC)    recent dx diet controlled   DVT (deep venous thrombosis) (HCC)    remote - took coumadin    Dysmenorrhea    Dysrhythmia    irregular heart beat   Edema    Fatty liver    Fibroid    GERD (gastroesophageal reflux disease)    hx of   Gout    Hypercholesteremia    Hypertension 4 months   Hypothyroid    Joint pain    Menometrorrhagia    Morbid obesity (HCC)    Osteoarthritis    Palpitations    Prediabetes    Substance abuse (HCC)    Vitamin D  deficiency    Past Surgical History:  Procedure Laterality Date   ABDOMINAL HYSTERECTOMY  2008   BREAST BIOPSY Left 2013   benign   BUNIONECTOMY Bilateral    CHOLECYSTECTOMY     COLONOSCOPY WITH PROPOFOL  N/A 10/27/2018   Procedure: COLONOSCOPY WITH PROPOFOL ;  Surgeon: Lennard Lesta FALCON, MD;  Location: [facility];  Service: Endoscopy;  Laterality: N/A;   ECTOPIC PREGNANCY SURGERY  1980's   MANDIBLE OSTEOTOMY Bilateral 07/03/2013   Procedure: BILATERAL TORI;  Surgeon: Glendia CHRISTELLA Primrose, DDS;  Location: [facility];  Service: Oral Surgery;  Laterality: Bilateral;   NM MYOCAR PERF WALL MOTION  07/2012   lexiscan  - normal pattern of perfusion, low risk   ROTATOR CUFF REPAIR Bilateral 2009   SLEEP STUDY  07/27/2012   AHI 4.8/hr   TOOTH EXTRACTION N/A 07/03/2013   Procedure: DENTAL EXTRACTION # 20;  Surgeon: Glendia CHRISTELLA Primrose, DDS;  Location: [facility];  Service: Oral Surgery;  Laterality: N/A;   TRANSTHORACIC ECHOCARDIOGRAM  07/2012   EF=>55%, mod conc LVH; LA mod dilated; trace MR; mild TR with normal RVSP; trace AV regurg   TUBAL LIGATION     Patient Active Problem List   Diagnosis Date Noted    Prediabetes 02/22/2023   Primary osteoarthritis of both hips 12/15/2022   Central adiposity 10/20/2022   Vitamin D  deficiency 08/11/2022   Other fatigue 07/29/2022   SOBOE (shortness of breath on exertion) 07/29/2022   Osteoarthritis of left hip 07/29/2022   Other hyperlipidemia 07/29/2022   Depression screening 07/29/2022   Paresthesia 06/30/2022   Peripheral neuropathy 05/13/2022   Pain in left wrist 04/24/2022   Carpal tunnel syndrome, bilateral 03/18/2022   Plantar fasciitis of right foot 09/03/2021   Pes planus 09/03/2021   Nuclear sclerotic cataract of both eyes 05/27/2021   Diabetes mellitus without complication (HCC) 05/27/2021   Vitreomacular adhesion of both eyes 05/27/2021   Antiphospholipid antibody positive 05/21/2021   Pain in left elbow 02/19/2021   Chronic left-sided low back pain with left-sided sciatica 07/22/2017   Thyroid  nodule 04/16/2017   Essential hypertension 03/13/2014   Menometrorrhagia    Dysmenorrhea    Anemia    Hyperlipidemia 05/23/2007   OBESITY, MORBID with starting BMI 45 05/23/2007   DYSFUNCTIONAL UTERINE BLEEDING - s/p LAVH 05/23/2007   ROTATOR CUFF SYNDROME 05/23/2007   KNEE PAIN, HX OF 05/23/2007    PCP: Joshua Francisco, MD  REFERRING PROVIDER: Jerri Kay CHRISTELLA,  MD  REFERRING DIAG: M17.11 (ICD-10-CM) - Primary osteoarthritis of right knee   THERAPY DIAG:  No diagnosis found.  Rationale for Evaluation and Treatment: Rehabilitation  ONSET DATE: 3 weeks ago  SUBJECTIVE:   SUBJECTIVE STATEMENT: Pt reports her low back has been bothering her more the past 3 days. Pt is not sure why it has increased, but it may be related to increased activity with grandkids at the pool. Pt states she has had a chronic Hx of low back pain.  EVAL: Pt reports about 3 weeks ago she was walking fast and her R knee cap slipped out of place to the outside and she needed to walk straight legged. She experienced significant R knee pain at that time. She was told  by Dr. Jerri she has bone on bone OA. Pt notes she has issues with her L hip as well and is to have a L THA sometime in the fall or the beginning of th next year. She states she needs to lose 10 more lbs for the surgery to be scheduled.  PERTINENT HISTORY: High BMI, DM, knee pain  PAIN:  Are you having pain? Yes: NPRS scale: 2/10 Pain location: R knee Pain description: ache Aggravating factors: prolonged walking and standing, steps - 1 at a time up with the L Relieving factors: pain meds, lidocaine  cream  PRECAUTIONS: None  RED FLAGS: None   WEIGHT BEARING RESTRICTIONS: No  FALLS:  Has patient fallen in last 6 months? No A few near falls with the R knee partially giving out  LIVING ENVIRONMENT: Lives with: lives with their family Lives in: House/apartment Stairs: Yes: External: 2 steps; bilateral but cannot reach both Has following equipment at home: Single point cane  OCCUPATION: Disability  PLOF: Independent with community mobility with device  PATIENT GOALS: Strengthening of her R leg  NEXT MD VISIT: Not scheduled  OBJECTIVE:  Note: Objective measures were completed at Evaluation unless otherwise noted.  DIAGNOSTIC FINDINGS: X-rays demonstrate severe osteoarthritis.  Bone-on-bone joint space  narrowing.   PATIENT SURVEYS:  LEFS: 35/80= 44%  COGNITION: Overall cognitive status: Within functional limits for tasks assessed     SENSATION: WFL  EDEMA:  Swelling of the R knee palapated  MUSCLE LENGTH: Hamstrings: Right tight deg; Left tight deg Thomas test: Right tight deg; Left tight deg  POSTURE: increased lumbar lordosis and anterior pelvic tilt  PALPATION: TTP to the lateral joint space  LOWER EXTREMITY ROM:  Active ROM Right eval Left eval Right 04/03/24 RT 04/12/24  Hip flexion      Hip extension      Hip abduction      Hip adduction      Hip internal rotation      Hip external rotation      Knee flexion 95 110 102 110d  Knee extension 0 0     Ankle dorsiflexion      Ankle plantarflexion      Ankle inversion      Ankle eversion       (Blank rows = not tested)  LOWER EXTREMITY MMT:  MMT Right eval Left eval Right  04/16/24 Rt 05/05/24  Hip flexion 3- 4  4+  Hip extension 3- 3    Hip abduction 3- 4  4+  Hip adduction      Hip internal rotation      Hip external rotation 3- 4    Knee flexion 3 4 4  4+  Knee extension 3 4 4  4+  Ankle  dorsiflexion      Ankle plantarflexion      Ankle inversion      Ankle eversion       (Blank rows = not tested)  LOWER EXTREMITY SPECIAL TESTS:  Knee special tests: Patellafemoral apprehension test: positive   FUNCTIONAL TESTS:  5 times sit to stand: 40.6 2 minute walk test: TBA  04/03/24: 287 feet 2 MWT  GAIT: Distance walked: 200' Assistive device utilized: Single point cane Level of assistance: Modified independence Comments: Decreased pace                                                                                                                       TREATMENT DATE:  [facility] Adult PT Treatment:                                                DATE: 05/18/24 Therapeutic Activities: LAQ 2x10 5 Seated knee lifts x15 each  Seated Heel raises x15 Seated hip abd x15 GTB Knee flexion red band 2x15 each Modalities: MH to low back to assist pt in participating in exs 20 mins Therapeutic Exercise: *** Manual Therapy: *** Neuromuscular re-ed: *** Therapeutic Activity: *** Modalities: *** Self Care: ***  [facility] Adult PT Treatment:                                                DATE: 05/08/24 Therapeutic Activities: LAQ 2x10 5 Seated knee lifts x15 each  Seated Heel raises x15 Seated hip abd x15 GTB Knee flexion red band 2x15 each Modalities: MH to low back to assist pt in participating in exs 20 mins  [facility] Adult PT Treatment:                                                DATE: 05/05/24 Therapeutic Activities: LAQ 2x10 5# 3 SLR x15 3# 3 Heel raises x15 Knee flexion  red band x 20 STS x10  on airex Standing hip abd on air x10 3# each Lateral side step to airex x15each  PATIENT EDUCATION:  Education details: Eval findings, POC, HEP, self care  Person educated: Patient Education method: Explanation, Demonstration, Tactile cues, Verbal cues, and Handouts Education comprehension: verbalized understanding, returned demonstration, verbal cues required, and tactile cues required  HOME EXERCISE PROGRAM: Access Code: [email or web address] URL: [email or web address] Date: 04/12/2024 Prepared by: Dasie Daft  Exercises - Supine Quad Set  - 2 x daily - 7 x weekly - 1 sets - 10 reps - 5 hold - Active Straight Leg Raise with Quad Set  - 2 x daily - 7 x weekly -  1 sets - 10 reps - 3 hold - Supine Bridge  - 2 x daily - 7 x weekly - 1 sets - 10 reps - 3 hold - Bridge with Arms at [employer] and Feet on [employer]  - 2 x daily - 7 x weekly - 1 sets - 10 reps - 3 hold - Hooklying Isometric Clamshell  - 2 x daily - 7 x weekly - 1 sets - 10 reps - 2 hold - Seated Long Arc Quad  - 1 x daily - 7 x weekly - 1 sets - 10 reps - 3 hold - Sit to Stand Without Arm Support  - 1 x daily - 7 x weekly - 1 sets - 10 reps  ASSESSMENT:  CLINICAL IMPRESSION: Pt's PT today was completed in sitting with pt not tolerating lying supine when her low back is bothering her. Pt presented to PT wearing and lumbar brace and walking with a SPC. Seated LE strengthening were completed c moist heat to the low back to help her participate in exs with her experiencing increased low back pain. Pt tolerated PT today without adverse effects. Will increase demand of PT and reassess LTGs the next session as pt is able.   EVAL: Patient is a 63 y.o. female who was seen today for physical therapy evaluation and treatment for M17.11 (ICD-10-CM) - Primary osteoarthritis of right knee . Pt presents with decreased strength of the R LE, decreased R knee flexion, and decreased function as noted with the 5xSTS and her  decreased pace for ambulation. A HEP was started to address deficits. Pt will benefit from skilled PT 2w8 to address impairments to optimize R knee/LE function with less pain.   OBJECTIVE IMPAIRMENTS: decreased balance, decreased mobility, difficulty walking, decreased ROM, decreased strength, obesity, and pain.   ACTIVITY LIMITATIONS: carrying, lifting, bending, sitting, standing, squatting, sleeping, stairs, bathing, dressing, hygiene/grooming, locomotion level, and caring for others  PARTICIPATION LIMITATIONS: meal prep, cleaning, laundry, shopping, and community activity  PERSONAL FACTORS: Fitness, Past/current experiences, Time since onset of injury/illness/exacerbation, and 1-2 comorbidities: high BMI, DM are also affecting patient's functional outcome.   REHAB POTENTIAL: Good  CLINICAL DECISION MAKING: Stable/uncomplicated  EVALUATION COMPLEXITY: Low   GOALS:  SHORT TERM GOALS: Target date: 04/21/24 Pt will be Ind in an initial HEP  Baseline:started Goal status: MET  2.  Assess Baseline: 287 feet Goal status: MET    LONG TERM GOALS: Target date: 06/02/24  Pt will be Ind in a final HEP to maintain achieved LOF  Baseline:  Goal status: INITIAL  2.  Increase R knee flexion to 105d for improved function with sitting and steps Baseline: 95d Goal status: met  3.  Increase R knee and hip strength to 3+ or greater for improved functional use of the R LE Baseline: 3- to 3 05/05/24: see flow sheet Goal status: MET  4.  Improve 5xSTS by MCID of 5 and by MCID of 42ft as indication of improved functional mobility  Baseline: 40.6' c armrests, TBA 04/03/24: 2 MWT 287 feet  04/27/24: 5xSTS 27.5 Goal status: ONGOING   5.  Pt's LEFS score will improvve by the MCID to 54%% as indication of improved function  Baseline: 44% Goal status: INITIAL  6.  Pt will report 25% or greater improvement in her R knee pain for improved function and QOL Baseline:  4-8/10 04/27/24: pt est 40% improvement in R knee pain Goal status: IMPROVING   PLAN:  PT FREQUENCY: 2x/week  PT DURATION: 8 weeks  PLANNED INTERVENTIONS: [id number]- PT Re-evaluation, 97110-Therapeutic exercises, 97530- Therapeutic activity, [id number]- Neuromuscular re-education, 97535- Self Care, [id number]- Manual therapy, [phone]- Gait training, [phone]- Aquatic Therapy, [phone]- Vasopneumatic device, [phone]- Ionotophoresis 4mg /ml Dexamethasone, 79439 (1-2 muscles), [id number] (3+ muscles)- Dry Needling, Patient/Family education, Balance training, Stair training, Taping, Joint mobilization, Cryotherapy, and Moist heat  PLAN FOR NEXT SESSION:  assess response to HEP; progress therex as indicated; use of modalities, manual therapy; and TPDN as indicated.  Lynlee Stratton MS, PT 05/17/24 8:56 AM

## 2024-05-18 ENCOUNTER — Ambulatory Visit

## 2024-05-19 ENCOUNTER — Ambulatory Visit: Admitting: Physical Medicine and Rehabilitation

## 2024-05-19 ENCOUNTER — Encounter: Payer: Self-pay | Admitting: Physical Medicine and Rehabilitation

## 2024-05-19 VITALS — BP 103/70 | HR 65

## 2024-05-19 DIAGNOSIS — M545 Low back pain, unspecified: Secondary | ICD-10-CM

## 2024-05-19 DIAGNOSIS — M47817 Spondylosis without myelopathy or radiculopathy, lumbosacral region: Secondary | ICD-10-CM | POA: Diagnosis not present

## 2024-05-19 DIAGNOSIS — M47816 Spondylosis without myelopathy or radiculopathy, lumbar region: Secondary | ICD-10-CM

## 2024-05-19 DIAGNOSIS — G8929 Other chronic pain: Secondary | ICD-10-CM | POA: Diagnosis not present

## 2024-05-19 NOTE — Progress Notes (Signed)
 Visit Reason:LBP-pain across LB, no leg pain Duration of symptoms:has had pain for years off and on but worse the last three weeks With no falls or injuries Pain Level: 8/10 Occupation: Disability  Diabetic: Diabetes mellitus without complication (HCC)   Smoking: No Heart/Lung History:none Blood Thinners: baby aspirin   Prior Testing/EMG:last xrays in 2022 Injections (Date):Last by Dr Eldonna in 2020 Treatments:Try Heat and Oxy 10mg  Prior Surgery:none

## 2024-05-19 NOTE — Progress Notes (Signed)
 [name] [place] - 63 y.o. female MRN [id number]  Date of birth: [birth year]  Office Visit Note: Visit Date: 05/19/2024 PCP: Joshua Francisco, MD Referred by: Joshua Francisco, MD  Subjective: Chief Complaint  Patient presents with   Lower Back - Pain   HPI: [name] is a 63 y.o. female who comes in today for evaluation of chronic, worsening and severe bilateral lower back pain. Pain ongoing for several years. She was last seen in our office in 2023 for both left hip and lower back issues. Her pain worsens with movement and activity. She describes pain as stabbing and aching sensation, currently rates as 8 out of 10. She was evaluated in the emergency room on 05/10/2024 for increased lower back pain. She was evaluated for possible UTI and discharged home. Some relief of pain with home exercise regimen, rest and use of medications. She is currently managed by Norman Larve, NP at [facility] for chronic pain syndrome, she is prescribed Percocet. Lumbar MRI imaging from 2023 shows advanced facet arthritis at L4-L5, there is moderate facet arthritis at L5-S1. There is no high grade spinal canal stenosis noted. We had planned to perform bilateral L4-L5 and L5-S1 facet joint injections, however patient was having issues with left hip and starting weight loss treatments at that time, she elected to hold on injections in 2023. She is here today to discuss injections and possible treatments for her continued lower back pain. She has done really well with weight loss, 92 lbs as of today. She plans on following up with Dr. Jerri regarding left hip at some point. Patient denies focal weakness, numbness and tingling. No recent trauma or falls. She is using cane to assist with ambulation.      Review of Systems  Musculoskeletal:  Positive for back pain.  Neurological:  Negative for tingling, sensory change, focal weakness and weakness.  All other systems reviewed and are negative.   Otherwise per HPI.  Assessment & Plan: Visit Diagnoses:    ICD-10-CM   1. Chronic bilateral low back pain without sciatica  M54.50 Ambulatory referral to Physical Medicine Rehab   G89.29     2. Spondylosis without myelopathy or radiculopathy, lumbosacral region  M47.817 Ambulatory referral to Physical Medicine Rehab    3. Facet arthropathy, lumbar  M47.816 Ambulatory referral to Physical Medicine Rehab       Plan: Findings:  Chronic, worsening and severe bilateral lower back pain. Patient continues to have severe pain despite good conservative therapies such as home exercise regimen, rest and use of medications. Patents clinical presentation and exam are consistent with facet mediated pain. There is facet arthropathy noted at L4-L5 and L5-S1. We discussed treatment plan in detail today. Next step is to perform diagnostic and hopefully therapeutic bilateral L4-L5 and L5-S1 facet joint injections under fluoroscopic guidance. If good relief of pain with facet blocks we discussed possibility of longer sustained pain relief with radiofrequency ablation. She can continue chronic pain management with Norman Larve, NP. She has no questions at this time. No red flag symptoms noted upon exam today. I instructed her to keep up the good work with weight loss and to follow up with Dr. Jerri regarding left hip.     Meds & Orders: No orders of the defined types were placed in this encounter.   Orders Placed This Encounter  Procedures   Ambulatory referral to Physical Medicine Rehab    Follow-up: Return for Bilateral L4-L5 and L5-S1 facet joint injections.  Procedures: No procedures performed      Clinical History: CLINICAL DATA:  Initial evaluation for low back pain with radiation into the left hip and lower extremity for 1 year.   EXAM: MRI LUMBAR SPINE WITHOUT CONTRAST   TECHNIQUE: Multiplanar, multisequence MR imaging of the lumbar spine was performed. No intravenous contrast was  administered.   COMPARISON:  Prior radiograph from a 02/02/2021 and MRI from 07/28/2017.   FINDINGS: Segmentation: Standard. Lowest well-formed disc space labeled the L5-S1 level.   Alignment: Exaggeration of the normal lumbar lordosis. Trace stepwise retrolisthesis of T12 on L1 and L1 on L2. Trace facet mediated anterolisthesis of L4 on L5.   Vertebrae: Vertebral body height maintained without acute or chronic fracture. Bone marrow signal intensity heterogeneous but overall within normal limits. No discrete or worrisome osseous lesions. No abnormal marrow edema.   Conus medullaris and cauda equina: Conus extends to the L1-2 level. Conus and cauda equina appear normal.   Paraspinal and other soft tissues: Paraspinous soft tissues within normal limits. Few small benign appearing cyst noted about the visualized kidneys. Visualized visceral structures otherwise unremarkable.   Disc levels:   T11-12: Mild disc bulge with disc desiccation. Left greater than right facet hypertrophy. No significant spinal stenosis. Mild left foraminal narrowing. Right neural foramen remains patent.   T12-L1: Mild disc bulge with disc desiccation. No spinal stenosis. Foramina remain patent.   L1-2: Intervertebral disc space narrowing with diffuse disc bulge and disc desiccation. Superimposed right foraminal to extraforaminal disc protrusion (series 6, image 19). No spinal stenosis. Mild right L1 foraminal narrowing. Left neural foramen remains patent.   L2-3: Negative interspace. Mild facet and ligament flavum hypertrophy. No canal or foraminal stenosis.   L3-4: Negative interspace. Mild to moderate facet and ligament flavum hypertrophy. No spinal stenosis. Mild left greater than right L3 foraminal narrowing.   L4-5: Trace anterolisthesis. Mild disc bulge with disc desiccation. Superimposed small right foraminal disc protrusion closely approximates and/or contacts the exiting right L4 nerve  root (series 6, image 37). Advanced bilateral facet arthrosis with ligament flavum hypertrophy. Resultant mild canal with moderate bilateral subarticular stenosis. Moderate bilateral L4 foraminal narrowing, worse on the right.   L5-S1: Disc desiccation with diffuse disc bulge. Mild reactive endplate spurring. Moderate left worse than right facet hypertrophy. No significant spinal stenosis. Moderate left worse than right L5 foraminal stenosis.   IMPRESSION: 1. Multifactorial degenerative changes at L4-5 with resultant mild canal with moderate bilateral subarticular stenosis, with moderate bilateral L4 foraminal narrowing. Superimposed right foraminal disc protrusion at this level could potentially affect the exiting right L4 nerve root. 2. Disc bulging with facet hypertrophy at L5-S1 with resultant moderate left worse than right L5 foraminal stenosis. 3. Additional mild noncompressive disc bulging with facet hypertrophy at L1-2 and L2-3 without significant stenosis or neural impingement.     Electronically Signed   By: Morene Hoard M.D.   On: 12/20/2021 03:21   She reports that she quit smoking about 13 years ago. Her smoking use included cigarettes. She started smoking about 19 years ago. She has a 0.6 pack-year smoking history. She has never used smokeless tobacco.  Recent Labs    07/22/23 0000 10/28/23 0813 01/24/24 1254  HGBA1C 5.3 5.4 5.2    Objective:  VS:  HT:    WT:   BMI:     BP:103/70  HR:65bpm  TEMP: ( )  RESP:  Physical Exam Vitals and nursing note reviewed.  HENT:     Head: Normocephalic  and atraumatic.     Right Ear: External ear normal.     Left Ear: External ear normal.     Nose: Nose normal.     Mouth/Throat:     Mouth: Mucous membranes are moist.  Eyes:     Extraocular Movements: Extraocular movements intact.  Cardiovascular:     Rate and Rhythm: Normal rate.     Pulses: Normal pulses.  Pulmonary:     Effort: Pulmonary effort is  normal.  Abdominal:     General: Abdomen is flat. There is no distension.  Musculoskeletal:        General: Tenderness present.     Cervical back: Normal range of motion.     Comments: Patient is slow to rise from seated position to standing. Pain noted with facet loading and lumbar extension.. 5/5 strength noted with bilateral hip flexion, knee flexion/extension, ankle dorsiflexion/plantarflexion and EHL. No clonus noted bilaterally. No pain upon palpation of greater trochanters. No pain with internal/external rotation of bilateral hips. Sensation intact bilaterally. Negative slump test bilaterally. Ambulates with cane, gait slow and unsteady.    Skin:    General: Skin is warm and dry.     Capillary Refill: Capillary refill takes less than 2 seconds.  Neurological:     Mental Status: She is alert and oriented to person, place, and time.     Gait: Gait abnormal.  Psychiatric:        Mood and Affect: Mood normal.        Behavior: Behavior normal.     Ortho Exam  Imaging: No results found.  Past Medical/Family/Surgical/Social History: Medications & Allergies reviewed per EMR, new medications updated. Patient Active Problem List   Diagnosis Date Noted   Prediabetes 02/22/2023   Primary osteoarthritis of both hips 12/15/2022   Central adiposity 10/20/2022   Vitamin D  deficiency 08/11/2022   Other fatigue 07/29/2022   SOBOE (shortness of breath on exertion) 07/29/2022   Osteoarthritis of left hip 07/29/2022   Other hyperlipidemia 07/29/2022   Depression screening 07/29/2022   Paresthesia 06/30/2022   Peripheral neuropathy 05/13/2022   Pain in left wrist 04/24/2022   Carpal tunnel syndrome, bilateral 03/18/2022   Plantar fasciitis of right foot 09/03/2021   Pes planus 09/03/2021   Nuclear sclerotic cataract of both eyes 05/27/2021   Diabetes mellitus without complication (HCC) 05/27/2021   Vitreomacular adhesion of both eyes 05/27/2021   Antiphospholipid antibody positive  05/21/2021   Pain in left elbow 02/19/2021   Chronic left-sided low back pain with left-sided sciatica 07/22/2017   Thyroid  nodule 04/16/2017   Essential hypertension 03/13/2014   Menometrorrhagia    Dysmenorrhea    Anemia    Hyperlipidemia 05/23/2007   OBESITY, MORBID with starting BMI 45 05/23/2007   DYSFUNCTIONAL UTERINE BLEEDING - s/p LAVH 05/23/2007   ROTATOR CUFF SYNDROME 05/23/2007   KNEE PAIN, HX OF 05/23/2007   Past Medical History:  Diagnosis Date   Anemia    Anxiety    Arthritis    Back pain    Chest pain    Diabetes mellitus without complication (HCC)    recent dx diet controlled   DVT (deep venous thrombosis) (HCC)    remote - took coumadin    Dysmenorrhea    Dysrhythmia    irregular heart beat   Edema    Fatty liver    Fibroid    GERD (gastroesophageal reflux disease)    hx of   Gout    Hypercholesteremia    Hypertension 4 months  Hypothyroid    Joint pain    Menometrorrhagia    Morbid obesity (HCC)    Osteoarthritis    Palpitations    Prediabetes    Substance abuse (HCC)    Vitamin D  deficiency    Family History  Problem Relation Age of Onset   Thyroid  disease Mother    Hyperlipidemia Mother    Diabetes Mother    Hypertension Mother    Cancer Mother    Deep vein thrombosis Mother        and PE   Heart disease Mother    Kidney disease Mother    Diabetes Father    Hypertension Father    Cancer Father 68   Throat cancer Brother 70   Pulmonary embolism Daughter    Heart disease Other        No family history   Past Surgical History:  Procedure Laterality Date   ABDOMINAL HYSTERECTOMY  2008   BREAST BIOPSY Left 2013   benign   BUNIONECTOMY Bilateral    CHOLECYSTECTOMY     COLONOSCOPY WITH PROPOFOL  N/A 10/27/2018   Procedure: COLONOSCOPY WITH PROPOFOL ;  Surgeon: Lennard Lesta FALCON, MD;  Location: [facility];  Service: Endoscopy;  Laterality: N/A;   ECTOPIC PREGNANCY SURGERY  1980's   MANDIBLE OSTEOTOMY Bilateral 07/03/2013   Procedure:  BILATERAL TORI;  Surgeon: Glendia CHRISTELLA Primrose, DDS;  Location: [facility] OR;  Service: Oral Surgery;  Laterality: Bilateral;   NM MYOCAR PERF WALL MOTION  07/2012   lexiscan  - normal pattern of perfusion, low risk   ROTATOR CUFF REPAIR Bilateral 2009   SLEEP STUDY  07/27/2012   AHI 4.8/hr   TOOTH EXTRACTION N/A 07/03/2013   Procedure: DENTAL EXTRACTION # 20;  Surgeon: Glendia CHRISTELLA Primrose, DDS;  Location: [facility] OR;  Service: Oral Surgery;  Laterality: N/A;   TRANSTHORACIC ECHOCARDIOGRAM  07/2012   EF=>55%, mod conc LVH; LA mod dilated; trace MR; mild TR with normal RVSP; trace AV regurg   TUBAL LIGATION     Social History   Occupational History    Employer: UNEMPLOYED    Comment: Disability  Tobacco Use   Smoking status: Former    Current packs/day: 0.00    Average packs/day: 0.1 packs/day for 6.0 years (0.6 ttl pk-yrs)    Types: Cigarettes    Start date: 02/13/2005    Quit date: 02/14/2011    Years since quitting: 13.2   Smokeless tobacco: Never  Vaping Use   Vaping status: Never Used  Substance and Sexual Activity   Alcohol use: No    Comment: Previous ETOH abuse   Drug use: No    Comment: Previous crack use    Sexual activity: Never    Birth control/protection: Abstinence

## 2024-05-29 ENCOUNTER — Telehealth: Payer: Self-pay

## 2024-05-29 DIAGNOSIS — F411 Generalized anxiety disorder: Secondary | ICD-10-CM

## 2024-05-29 MED ORDER — DIAZEPAM 5 MG PO TABS: ORAL_TABLET | ORAL | 0 refills | Status: DC

## 2024-05-29 NOTE — Telephone Encounter (Signed)
 Requesting pre medication for injection 05/31/24

## 2024-05-29 NOTE — Addendum Note (Signed)
 Addended by: ELDONNA GARDINER POUR on: 05/29/2024 03:33 PM   Modules accepted: Orders

## 2024-05-31 ENCOUNTER — Ambulatory Visit: Admitting: Physical Medicine and Rehabilitation

## 2024-05-31 ENCOUNTER — Encounter: Payer: Self-pay | Admitting: Physical Medicine and Rehabilitation

## 2024-05-31 ENCOUNTER — Other Ambulatory Visit: Payer: Self-pay

## 2024-05-31 VITALS — BP 97/67

## 2024-05-31 DIAGNOSIS — M47816 Spondylosis without myelopathy or radiculopathy, lumbar region: Secondary | ICD-10-CM

## 2024-05-31 MED ORDER — METHYLPREDNISOLONE ACETATE 40 MG/ML IJ SUSP: 40.0000 mg | Freq: Once | INTRAMUSCULAR | Status: AC

## 2024-05-31 NOTE — Progress Notes (Signed)
 Pain Scale   Average Pain 4   Patient having bilateral lower back pain     +Driver, -BT, -Dye Allergies.

## 2024-06-01 ENCOUNTER — Ambulatory Visit (INDEPENDENT_AMBULATORY_CARE_PROVIDER_SITE_OTHER): Admitting: Family Medicine

## 2024-06-01 ENCOUNTER — Encounter (INDEPENDENT_AMBULATORY_CARE_PROVIDER_SITE_OTHER): Payer: Self-pay | Admitting: Family Medicine

## 2024-06-01 VITALS — BP 96/62 | HR 80 | Temp 98.3°F | Ht 66.0 in | Wt 254.0 lb

## 2024-06-01 DIAGNOSIS — E1169 Type 2 diabetes mellitus with other specified complication: Secondary | ICD-10-CM | POA: Diagnosis not present

## 2024-06-01 DIAGNOSIS — Z7985 Long-term (current) use of injectable non-insulin antidiabetic drugs: Secondary | ICD-10-CM

## 2024-06-01 DIAGNOSIS — G8929 Other chronic pain: Secondary | ICD-10-CM | POA: Diagnosis not present

## 2024-06-01 DIAGNOSIS — E1159 Type 2 diabetes mellitus with other circulatory complications: Secondary | ICD-10-CM

## 2024-06-01 DIAGNOSIS — M5441 Lumbago with sciatica, right side: Secondary | ICD-10-CM | POA: Diagnosis not present

## 2024-06-01 DIAGNOSIS — E559 Vitamin D deficiency, unspecified: Secondary | ICD-10-CM

## 2024-06-01 DIAGNOSIS — M5442 Lumbago with sciatica, left side: Secondary | ICD-10-CM

## 2024-06-01 DIAGNOSIS — I152 Hypertension secondary to endocrine disorders: Secondary | ICD-10-CM

## 2024-06-01 DIAGNOSIS — Z6841 Body Mass Index (BMI) 40.0 and over, adult: Secondary | ICD-10-CM

## 2024-06-01 MED ORDER — VITAMIN D3 50 MCG (2000 UT) PO CAPS
2000.0000 [IU] | ORAL_CAPSULE | Freq: Every day | ORAL | Status: DC
Start: 2024-06-01 — End: 2024-08-16

## 2024-06-01 MED ORDER — SEMAGLUTIDE (1 MG/DOSE) 4 MG/3ML ~~LOC~~ SOPN: 1.0000 mg | PEN_INJECTOR | SUBCUTANEOUS | 0 refills | Status: DC

## 2024-06-01 NOTE — Progress Notes (Signed)
 [name], D.O.  ABFM, ABOM Specializing in Clinical Bariatric Medicine  Office located at: [address] [place], [place]  [address]     Assessment and Plan:   Medications Discontinued During This Encounter  Medication Reason   GAVILYTE-N WITH FLAVOR PACK 420 g solution    metoprolol  succinate (TOPROL -XL) 25 MG 24 hr tablet No longer needed (for PRN medications)   Vitamin D , Ergocalciferol , (DRISDOL ) 1.25 MG (50000 UNIT) CAPS capsule    Semaglutide , 1 MG/DOSE, 4 MG/3ML SOPN Reorder    Meds ordered this encounter  Medications   Semaglutide , 1 MG/DOSE, 4 MG/3ML SOPN    Sig: Inject 1 mg as directed once a week.    Dispense:  3 mL    Refill:  0   Cholecalciferol (VITAMIN D3) 50 MCG (2000 UT) capsule    Sig: Take 1 capsule (2,000 Units total) by mouth daily. ONLY TAKE ONCE OUT OF ERGOCAL      FOR THE DISEASE OF OBESITY:  OBESITY, MORBID with starting BMI 45 BMI 40.0-44.9, adult (HCC), current BMI 41.02 Assessment & Plan: Since last office visit on 04/18/24 patient's muscle mass has increased by 1.6 lbs. Fat mass has increased by 1 lbs. Total body water has decreased by 4.6 lbs.  Body fat % has increase by 0.1%. Counseling done on how various foods will affect these numbers and how to maximize success  Total lbs lost to date: 26 lbs Total weight loss percentage to date: -9.29 %   Recommended Dietary Goals Ruqayya is currently in the action stage of change. As such, her goal is to continue weight management plan.  She has agreed to: continue current plan   Behavioral Intervention We discussed the following today: increasing lean protein intake to established goals, decreasing simple carbohydrates , increasing vegetables, increasing lower glycemic fruits, increasing fiber rich foods, avoiding skipping meals, increasing water intake , keeping healthy foods at home, decreasing eating out or consumption of processed foods, and making healthy choices when eating  convenient foods, and work on managing stress, creating time for self-care and relaxation, eating on plan while taking medications to avoid any SE.   Additional resources provided today: None  Evidence-based interventions for health behavior change were utilized today including the discussion of self monitoring techniques, problem-solving barriers and SMART goal setting techniques.   Regarding patient's less desirable eating habits and patterns, we employed the technique of small changes.   Pt will specifically work on: eating 6 ounces of protein at lunch and 8 ounces of protein at dinner.    Recommended Physical Activity Goals Amaya has been advised to work up to 300-450 minutes of moderate intensity aerobic activity a week and strengthening exercises 2-3 times per week for cardiovascular health, weight loss maintenance and preservation of muscle mass.   She has agreed to: Increase physical activity in their day and reduce sedentary time (increase NEAT). and Unable to participate in physical activity at present due to medical conditions , but will gradually increase her exercise.    Pharmacotherapy See T2DM A&P section below.   ASSOCIATED CONDITIONS ADDRESSED TODAY:  Chronic bilateral low back pain with sciatica, sciatica laterality unspecified Assessment & Plan: Pt presented to the ED c/o severe lower back pain on 05/10/24. Had a f/u with ortho on 05/31/2024 she received facet injection, which did help relieve her pain. Pt reports her back pain has limited her ability to exercise.   Encouraged pt to start walking as able. Reminded pt that losing  5-10% of her body weight will likely improve her condition. Continue following up with ortho and PM&R as instructed by them. Will continue alongside PCP/specialists.     Type 2 diabetes mellitus with obesity [facility]) Assessment & Plan: Lab Results  Component Value Date   HGBA1C 5.2 01/24/2024   HGBA1C 5.4 10/28/2023   HGBA1C 5.3 07/22/2023    INSULIN  8.2 07/29/2022   Currently on Ozempic  1 mg once weekly; taking on Fridays. Good compliance/tolerance. Pt is asymptomatic and denies any GI upset or other adverse SE. She will be taking her last dose of her current supply tomorrow. Monitoring sugars at home with her highest reading being 110. Well controlled hunger/cravings. Takes Miralax once daily to hep with constipation.   Discussed the option of increasing Ozempic  or continuing on her current dose. Pt prefers to stay at her current dose as she continues to work on following her nutritional meal plan and other lifestyle modifications. Avoid skipping meals. Will refill Ozempic  today with no dose changes. Continue with Miralax and increase to 2x/day prn. Also discussed properly hydrating, increasing fiber intake, and walking to manage constipation. Will continue monitoring of condition.     Hypertension associated with type 2 diabetes mellitus [facility]) Assessment & Plan: BP Readings from Last 3 Encounters:  06/01/24 96/62  05/31/24 97/67  05/19/24 103/70   BP at low normal today and reports a reading of 109/67 yesterday. Currently on Lisinopril 5 mg once daily, and Metoprolol  25 mg once daily, and Lasix 20 mg once daily. She endorses feeling fatigued and sluggish on Metoprolol . She was planning on stopping Metoprolol , but reports believing she had to be tapered off the medication. Pt has no irregular heart beat or reason she can think of that she is on metoprolol .  Discussed obesogenic nature of beta blockers including Metoprolol . Given she reports side effects, her BP has decreased, and it making wt loss more challenging, I recommend she discontinue Metoprolol  ONLY if she will check her BP daily AND will f/u in 1 month. If her BP increase significantly after discontinuing, she may resume a low dose of Amlodipine. Encouraged pt to check her P very often.     Vitamin D  deficiency Assessment & Plan: Lab Results  Component Value Date    VD25OH 64.34 03/31/2024   VD25OH 76.6 01/24/2024   VD25OH 56.4 09/02/2023   Currently ERGO 50K units every 21 days (~2300 units daily on avg). Good compliance and tolerance reported. No adverse SE reported.   We mutually agreed pt will continue with ERGO for 3-4 months until her current prescribed supply finished, at which time she will switch to OTC vit D at ~2300 units daily. Will continue monitoring.     Follow up:   Return in about 4 weeks (around 06/29/2024) for f/u in 4 weeks. She was informed of the importance of frequent follow up visits to maximize her success with intensive lifestyle modifications for her multiple health conditions.  Subjective:   Chief complaint: Obesity Christia is here to discuss her progress with her obesity treatment plan. She is on the Category 2 Plan and states she is following her eating plan approximately 60% of the time. She states she is not exercising.   Interval History:  Normagene Harvie is here for a follow up office visit. Since last OV on 04/18/24, she is down 2 lbs. Eating 6 oz protein at lunch. Drinking approx.1 gallon of water per day. Skipped about 4 meals per month, on avg once weekly. Supplements  with a protein shake when skipping meals. Drinks a protein shake w a sandwich when she doesn't want to finish her entire sandwich. She is eating more whole foods.   She endorses back pain, that has limited her ability to exercise. She saw orthopedics yesterday and received facet injections.   Pharmacotherapy that aid with weight loss: She is currently taking Ozempic  1 mg once weekly.    Review of Systems:  Pertinent positives were addressed with patient today.  Reviewed by clinician on day of visit: allergies, medications, problem list, medical history, surgical history, family history, social history, and previous encounter notes.  Weight Summary and Biometrics   Weight Lost Since Last Visit: 2 lb  Weight Gained Since Last Visit: 0     Vitals Temp: 98.3 F (36.8 C) BP: 96/62 Pulse Rate: 80 SpO2: 98 %   Anthropometric Measurements Height: 5' 6 (1.676 m) Weight: 254 lb (115.2 kg) BMI (Calculated): 41.02 Weight at Last Visit: 256 lb Weight Lost Since Last Visit: 2 lb Weight Gained Since Last Visit: 0 Starting Weight: 280 lb Total Weight Loss (lbs): 26 lb (11.8 kg) Peak Weight: 330 lb   Body Composition  Body Fat %: 45.2 % Fat Mass (lbs): 114.8 lbs Muscle Mass (lbs): 132.4 lbs Total Body Water (lbs): 87.8 lbs Visceral Fat Rating : 15   Other Clinical Data RMR: 1670 Fasting: no Labs: no Today's Visit #: 22 Starting Date: 07/29/22    Objective:   PHYSICAL EXAM: Blood pressure 96/62, pulse 80, temperature 98.3 F (36.8 C), height 5' 6 (1.676 m), weight 254 lb (115.2 kg), SpO2 98%. Body mass index is 41 kg/m.  General: she is overweight, cooperative and in no acute distress. PSYCH: Has normal mood, affect and thought process.   HEENT: EOMI, sclerae are anicteric. Lungs: Normal breathing effort, no conversational dyspnea. Extremities: Moves * 4 Neurologic: A and O * 3, good insight  DIAGNOSTIC DATA REVIEWED: BMET    Component Value Date/Time   NA 145 03/31/2024 0000   K 4.8 03/31/2024 0000   CL 108 03/31/2024 0000   CO2 29 (A) 03/31/2024 0000   GLUCOSE 90 12/08/2023 1026   BUN 25 (A) 03/31/2024 0000   CREATININE 0.9 03/31/2024 0000   CREATININE 1.00 12/08/2023 1026   CREATININE 1.06 (H) 04/18/2021 0905   CALCIUM 9.5 03/31/2024 0000   GFRNONAA >60 12/08/2023 1026   GFRNONAA 57 (L) 04/18/2021 0905   GFRAA 66 04/18/2021 0905   Lab Results  Component Value Date   HGBA1C 5.2 01/24/2024   HGBA1C 6.0 11/20/2009   Lab Results  Component Value Date   INSULIN  8.2 07/29/2022   Lab Results  Component Value Date   TSH 1.02 03/31/2024   CBC    Component Value Date/Time   WBC 6.2 12/08/2023 1026   WBC 5.5 06/03/2023 1019   RBC 4.29 12/08/2023 1026   HGB 5.1 (A) 03/31/2024 0000    HGB 12.8 12/08/2023 1026   HGB 12.8 10/28/2023 0813   HCT 37.2 12/08/2023 1026   HCT 41.2 10/28/2023 0813   PLT 268 12/08/2023 1026   PLT 272 10/28/2023 0813   MCV 86.7 12/08/2023 1026   MCV 91 10/28/2023 0813   MCH 29.8 12/08/2023 1026   MCHC 34.4 12/08/2023 1026   RDW 13.8 12/08/2023 1026   RDW 14.6 10/28/2023 0813   Iron Studies No results found for: IRON, TIBC, FERRITIN, IRONPCTSAT Lipid Panel     Component Value Date/Time   CHOL 122 03/31/2024 0000   CHOL  129 07/29/2022 0930   TRIG 71 03/31/2024 0000   HDL 50 03/31/2024 0000   HDL 59 07/29/2022 0930   CHOLHDL 2.2 07/29/2022 0930   CHOLHDL 5.0 Ratio 10/07/2009 1913   VLDL 38 10/07/2009 1913   LDLCALC 58 03/31/2024 0000   LDLCALC 53 07/29/2022 0930   Hepatic Function Panel     Component Value Date/Time   PROT 7.6 12/08/2023 1026   PROT 7.2 10/28/2023 0813   ALBUMIN 4.2 12/08/2023 1026   ALBUMIN 4.3 10/28/2023 0813   AST 40 (A) 03/31/2024 0000   AST 34 12/08/2023 1026   ALT 70 (A) 03/31/2024 0000   ALT 55 (H) 12/08/2023 1026   ALKPHOS 58 03/31/2024 0000   BILITOT 0.4 12/08/2023 1026   BILIDIR <0.1 06/03/2023 1019   IBILI NOT CALCULATED 06/03/2023 1019      Component Value Date/Time   TSH 1.02 03/31/2024 0000   TSH 0.589 07/29/2022 0930   Nutritional Lab Results  Component Value Date   VD25OH 64.34 03/31/2024   VD25OH 76.6 01/24/2024   VD25OH 56.4 09/02/2023    Attestations:   I, Vernell Forest, acting as a Stage manager for Tonya Jenkins, DO., have compiled all relevant documentation for today's office visit on behalf of Tonya Jenkins, DO, while in the presence of [employer], DO.  I have reviewed the above documentation for accuracy and completeness, and I agree with the above. [name], D.O.  The 21st Century Cures Act was signed into law in 2016 which includes the topic of electronic health records.  This provides immediate access to information in MyChart.  This  includes consultation notes, operative notes, office notes, lab results and pathology reports.  If you have any questions about what you read please let us  know at your next visit so we can discuss your concerns and take corrective action if need be.  We are right here with you.

## 2024-06-04 NOTE — Procedures (Signed)
 Lumbar Facet Joint Intra-Articular Injection(s) with Fluoroscopic Guidance  Patient: [name]      Date of Birth: November 10, 1960 MRN: [id number] PCP: Joshua Francisco, MD      Visit Date: 05/31/2024   Universal Protocol:    Date/Time: 05/31/2024  Consent Given By: the patient  Position: PRONE   Additional Comments: Vital signs were monitored before and after the procedure. Patient was prepped and draped in the usual sterile fashion. The correct patient, procedure, and site was verified.   Injection Procedure Details:  Procedure Site One Meds Administered:  Meds ordered this encounter  Medications   methylPREDNISolone  acetate (DEPO-MEDROL ) injection 40 mg     Laterality: Bilateral  Location/Site:  L4-L5  Needle size: 22 guage  Needle type: Spinal  Needle Placement: Articular  Findings:  -Comments: Excellent flow of contrast producing a partial arthrogram.  Procedure Details: The fluoroscope beam is vertically oriented in AP, and the inferior recess is visualized beneath the lower pole of the inferior apophyseal process, which represents the target point for needle insertion. When direct visualization is difficult the target point is located at the medial projection of the vertebral pedicle. The region overlying each aforementioned target is locally anesthetized with a 1 to 2 ml. volume of 1% Lidocaine  without Epinephrine .   The spinal needle was inserted into each of the above mentioned facet joints using biplanar fluoroscopic guidance. A 0.25 to 0.5 ml. volume of Isovue-250 was injected and a partial facet joint arthrogram was obtained. A single spot film was obtained of the resulting arthrogram.    One to 1.25 ml of the steroid/anesthetic solution was then injected into each of the facet joints noted above.   Additional Comments:  The patient tolerated the procedure well Dressing: 2 x 2 sterile gauze and Band-Aid    Post-procedure details: Patient was  observed during the procedure. Post-procedure instructions were reviewed.  Patient left the clinic in stable condition.

## 2024-06-04 NOTE — Progress Notes (Signed)
 [name] [place] - 63 y.o. female MRN [id number]  Date of birth: 1961/08/03  Office Visit Note: Visit Date: 05/31/2024 PCP: Joshua Francisco, MD Referred by: Joshua Francisco, MD  Subjective: No chief complaint on file.  HPI:  [name] is a 63 y.o. female who comes in today at the request of Duwaine Pouch, FNP for planned Bilateral  L4-5 Lumbar facet/medial branch block with fluoroscopic guidance.  The patient has failed conservative care including home exercise, medications, time and activity modification.  This injection will be diagnostic and hopefully therapeutic.  Please see requesting physician notes for further details and justification.  Exam has shown concordant pain with facet joint loading.   ROS Otherwise per HPI.  Assessment & Plan: Visit Diagnoses:    ICD-10-CM   1. Spondylosis without myelopathy or radiculopathy, lumbar region  M47.816 XR C-ARM NO REPORT    Facet Injection    methylPREDNISolone  acetate (DEPO-MEDROL ) injection 40 mg      Plan: No additional findings.   Meds & Orders:  Meds ordered this encounter  Medications   methylPREDNISolone  acetate (DEPO-MEDROL ) injection 40 mg    Orders Placed This Encounter  Procedures   Facet Injection   XR C-ARM NO REPORT    Follow-up: Return for visit to requesting provider as needed.   Procedures: No procedures performed  Lumbar Facet Joint Intra-Articular Injection(s) with Fluoroscopic Guidance  Patient: [name]      Date of Birth: 1960-10-23 MRN: [id number] PCP: Joshua Francisco, MD      Visit Date: 05/31/2024   Universal Protocol:    Date/Time: 05/31/2024  Consent Given By: the patient  Position: PRONE   Additional Comments: Vital signs were monitored before and after the procedure. Patient was prepped and draped in the usual sterile fashion. The correct patient, procedure, and site was verified.   Injection Procedure Details:  Procedure Site One Meds Administered:   Meds ordered this encounter  Medications   methylPREDNISolone  acetate (DEPO-MEDROL ) injection 40 mg     Laterality: Bilateral  Location/Site:  L4-L5  Needle size: 22 guage  Needle type: Spinal  Needle Placement: Articular  Findings:  -Comments: Excellent flow of contrast producing a partial arthrogram.  Procedure Details: The fluoroscope beam is vertically oriented in AP, and the inferior recess is visualized beneath the lower pole of the inferior apophyseal process, which represents the target point for needle insertion. When direct visualization is difficult the target point is located at the medial projection of the vertebral pedicle. The region overlying each aforementioned target is locally anesthetized with a 1 to 2 ml. volume of 1% Lidocaine  without Epinephrine .   The spinal needle was inserted into each of the above mentioned facet joints using biplanar fluoroscopic guidance. A 0.25 to 0.5 ml. volume of Isovue-250 was injected and a partial facet joint arthrogram was obtained. A single spot film was obtained of the resulting arthrogram.    One to 1.25 ml of the steroid/anesthetic solution was then injected into each of the facet joints noted above.   Additional Comments:  The patient tolerated the procedure well Dressing: 2 x 2 sterile gauze and Band-Aid    Post-procedure details: Patient was observed during the procedure. Post-procedure instructions were reviewed.  Patient left the clinic in stable condition.    Clinical History: CLINICAL DATA:  Initial evaluation for low back pain with radiation into the left hip and lower extremity for 1 year.   EXAM: MRI LUMBAR SPINE WITHOUT CONTRAST   TECHNIQUE: Multiplanar, multisequence  MR imaging of the lumbar spine was performed. No intravenous contrast was administered.   COMPARISON:  Prior radiograph from a 02/02/2021 and MRI from 07/28/2017.   FINDINGS: Segmentation: Standard. Lowest well-formed disc space  labeled the L5-S1 level.   Alignment: Exaggeration of the normal lumbar lordosis. Trace stepwise retrolisthesis of T12 on L1 and L1 on L2. Trace facet mediated anterolisthesis of L4 on L5.   Vertebrae: Vertebral body height maintained without acute or chronic fracture. Bone marrow signal intensity heterogeneous but overall within normal limits. No discrete or worrisome osseous lesions. No abnormal marrow edema.   Conus medullaris and cauda equina: Conus extends to the L1-2 level. Conus and cauda equina appear normal.   Paraspinal and other soft tissues: Paraspinous soft tissues within normal limits. Few small benign appearing cyst noted about the visualized kidneys. Visualized visceral structures otherwise unremarkable.   Disc levels:   T11-12: Mild disc bulge with disc desiccation. Left greater than right facet hypertrophy. No significant spinal stenosis. Mild left foraminal narrowing. Right neural foramen remains patent.   T12-L1: Mild disc bulge with disc desiccation. No spinal stenosis. Foramina remain patent.   L1-2: Intervertebral disc space narrowing with diffuse disc bulge and disc desiccation. Superimposed right foraminal to extraforaminal disc protrusion (series 6, image 19). No spinal stenosis. Mild right L1 foraminal narrowing. Left neural foramen remains patent.   L2-3: Negative interspace. Mild facet and ligament flavum hypertrophy. No canal or foraminal stenosis.   L3-4: Negative interspace. Mild to moderate facet and ligament flavum hypertrophy. No spinal stenosis. Mild left greater than right L3 foraminal narrowing.   L4-5: Trace anterolisthesis. Mild disc bulge with disc desiccation. Superimposed small right foraminal disc protrusion closely approximates and/or contacts the exiting right L4 nerve root (series 6, image 37). Advanced bilateral facet arthrosis with ligament flavum hypertrophy. Resultant mild canal with moderate bilateral subarticular  stenosis. Moderate bilateral L4 foraminal narrowing, worse on the right.   L5-S1: Disc desiccation with diffuse disc bulge. Mild reactive endplate spurring. Moderate left worse than right facet hypertrophy. No significant spinal stenosis. Moderate left worse than right L5 foraminal stenosis.   IMPRESSION: 1. Multifactorial degenerative changes at L4-5 with resultant mild canal with moderate bilateral subarticular stenosis, with moderate bilateral L4 foraminal narrowing. Superimposed right foraminal disc protrusion at this level could potentially affect the exiting right L4 nerve root. 2. Disc bulging with facet hypertrophy at L5-S1 with resultant moderate left worse than right L5 foraminal stenosis. 3. Additional mild noncompressive disc bulging with facet hypertrophy at L1-2 and L2-3 without significant stenosis or neural impingement.     Electronically Signed   By: Morene Hoard M.D.   On: 12/20/2021 03:21     Objective:  VS:  HT:    WT:   BMI:     BP:97/67  HR: bpm  TEMP: ( )  RESP:  Physical Exam Vitals and nursing note reviewed.  Constitutional:      General: She is not in acute distress.    Appearance: Normal appearance. She is not ill-appearing.  HENT:     Head: Normocephalic and atraumatic.     Right Ear: External ear normal.     Left Ear: External ear normal.  Eyes:     Extraocular Movements: Extraocular movements intact.  Cardiovascular:     Rate and Rhythm: Normal rate.     Pulses: Normal pulses.  Pulmonary:     Effort: Pulmonary effort is normal. No respiratory distress.  Abdominal:     General: There is no  distension.     Palpations: Abdomen is soft.  Musculoskeletal:        General: Tenderness present.     Cervical back: Neck supple.     Right lower leg: No edema.     Left lower leg: No edema.     Comments: Patient has good distal strength with no pain over the greater trochanters.  No clonus or focal weakness.  Skin:    Findings: No  erythema, lesion or rash.  Neurological:     General: No focal deficit present.     Mental Status: She is alert and oriented to person, place, and time.     Sensory: No sensory deficit.     Motor: No weakness or abnormal muscle tone.     Coordination: Coordination normal.  Psychiatric:        Mood and Affect: Mood normal.        Behavior: Behavior normal.      Imaging: No results found.

## 2024-06-06 ENCOUNTER — Telehealth (INDEPENDENT_AMBULATORY_CARE_PROVIDER_SITE_OTHER): Payer: Self-pay

## 2024-06-06 NOTE — Progress Notes (Incomplete)
 [name], D.O.  ABFM, ABOM Specializing in Clinical Bariatric Medicine  Office located at: [address] [place], [place]  [address]   Assessment and Plan:   Medications Discontinued During This Encounter  Medication Reason  . GAVILYTE-N WITH FLAVOR PACK 420 g solution   . metoprolol  succinate (TOPROL -XL) 25 MG 24 hr tablet No longer needed (for PRN medications)  . Vitamin D , Ergocalciferol , (DRISDOL ) 1.25 MG (50000 UNIT) CAPS capsule   . Semaglutide , 1 MG/DOSE, 4 MG/3ML SOPN Reorder    Meds ordered this encounter  Medications  . Semaglutide , 1 MG/DOSE, 4 MG/3ML SOPN    Sig: Inject 1 mg as directed once a week.    Dispense:  3 mL    Refill:  0  . Cholecalciferol (VITAMIN D3) 50 MCG (2000 UT) capsule    Sig: Take 1 capsule (2,000 Units total) by mouth daily. ONLY TAKE ONCE OUT OF ERGOCAL      FOR THE DISEASE OF OBESITY:  OBESITY, MORBID with starting BMI 45 BMI 40.0-44.9, adult (HCC), current BMI 41.02 Assessment & Plan: Since last office visit on 04/18/24 patient's muscle mass has increased by 1.6 lbs. Fat mass has increased by 1 lbs. Total body water has decreased by 4.6 lbs.  Body fat % has increase by 0.1%. Counseling done on how various foods will affect these numbers and how to maximize success  Total lbs lost to date: 26 lbs Total weight loss percentage to date: -9.29 %   Recommended Dietary Goals [name] is currently in the action stage of change. As such, her goal is to continue weight management plan.  She has agreed to: continue current plan   Behavioral Intervention We discussed the following today: {dowtlossstrategies:31654}  Additional resources provided today: None  Evidence-based interventions for health behavior change were utilized today including the discussion of self monitoring techniques, problem-solving barriers and SMART goal setting techniques.   Regarding patient's less desirable eating habits and patterns, we employed the  technique of small changes.   Pt will specifically work on: ***    Recommended Physical Activity Goals [name] has been advised to work up to 300-450 minutes of moderate intensity aerobic activity a week and strengthening exercises 2-3 times per week for cardiovascular health, weight loss maintenance and preservation of muscle mass.   She has agreed to: {EMEXERCISE:28847::Think about enjoyable ways to increase daily physical activity and overcoming barriers to exercise,Increase physical activity in their day and reduce sedentary time (increase NEAT).}   Pharmacotherapy We both agreed to: {EMagreedrx:29170}   ASSOCIATED CONDITIONS ADDRESSED TODAY:  Chronic bilateral low back pain with sciatica, sciatica laterality unspecified Assessment & Plan: Pt presented to the ED c/o severe lower back pain on 05/10/24. Had a f/u with ortho on 05/31/2024 she received facet injection, which did help relieve her pain.    ***    Type 2 diabetes mellitus with obesity [facility]) Assessment & Plan: Lab Results  Component Value Date   HGBA1C 5.2 01/24/2024   HGBA1C 5.4 10/28/2023   HGBA1C 5.3 07/22/2023   INSULIN  8.2 07/29/2022    Currently on Ozempic  1 mg once weekly; taking on Fridays. Good compliance/tolerance. Pt is asymptomatic and denies any GI upset or other adverse SE. She will be taking her last dose of her current supply tomorrow. Monitoring sugars at home with her highest reading being 110. Well controlled hunger/cravings. Takes Miralax once daily to hep with constipation.    Discussed the option of increasing Ozempic  or continuing on her current  dose. Pt prefers to stay at her current dose as she continues to work on following her nutritional meal plan and other lifestyle modifications. Will refill Ozempic  today with no dose changes. Continue with Miralax and increase to 2x/day prn. Properly hdrast and increase fiber in take      ***     Hypertension associated with type 2  diabetes mellitus [facility]) Assessment & Plan: BP Readings from Last 3 Encounters:  06/01/24 96/62  05/31/24 97/67  05/19/24 103/70   Currently on Lisinopril 5 mg once daily, Lasix 20 mg once daily.    On Lasix 20 mg daily. Zestril 1 tablet daily  Recommended to stop Metoprolol  due to it hindering loss weight. Pt has no irregular heart beat or reason she can think of that she is on metoprolol . If Blood pressure goes up with stopping metoprolol  starting a low dose of amlodipine Check    ***   Vitamin D  deficiency Assessment & Plan: Lab Results  Component Value Date   VD25OH 64.34 03/31/2024   VD25OH 76.6 01/24/2024   VD25OH 56.4 09/02/2023   Currently ERGO 50K units every 21 days (~2300 units daily on avg). Good compliance and tolerance reported. No adverse SE reported.   We mutually agreed pt will continue with ERGO for 3-4 months until her current prescribed supply finished, at which time she will switch to OTC vit D at ~2300 units daily. Will continue monitoring.     Follow up:   Return in about 4 weeks (around 06/29/2024) for f/u in 4 weeks. She was informed of the importance of frequent follow up visits to maximize her success with intensive lifestyle modifications for her multiple health conditions.  Subjective:   Chief complaint: Obesity [name] is here to discuss her progress with her obesity treatment plan. She is on the Category 2 Plan and states she is following her eating plan approximately 60% of the time. She states she is not exercising.   Interval History:  [name] is here for a follow up office visit. Since last OV on 04/18/24, she is down 2 lbs. Eating 6 oz protein at lunch. Drinking approx.1 gallon of water per day. Skipped about 4 meals per month, on avg once weekly. Supplements with a protein shake when skipping meals. Drinks a protein shake w a sandwich when she doesn't want to finish her entire sandwich. She is eating more whole foods.     Pharmacotherapy that aid with weight loss: She is currently taking .    Review of Systems:  Pertinent positives were addressed with patient today.  Reviewed by clinician on day of visit: allergies, medications, problem list, medical history, surgical history, family history, social history, and previous encounter notes.  Weight Summary and Biometrics   Weight Lost Since Last Visit: 2 lb  Weight Gained Since Last Visit: 0    Vitals Temp: 98.3 F (36.8 C) BP: 96/62 Pulse Rate: 80 SpO2: 98 %   Anthropometric Measurements Height: 5' 6 (1.676 m) Weight: 254 lb (115.2 kg) BMI (Calculated): 41.02 Weight at Last Visit: 256 lb Weight Lost Since Last Visit: 2 lb Weight Gained Since Last Visit: 0 Starting Weight: 280 lb Total Weight Loss (lbs): 26 lb (11.8 kg) Peak Weight: 330 lb   Body Composition  Body Fat %: 45.2 % Fat Mass (lbs): 114.8 lbs Muscle Mass (lbs): 132.4 lbs Total Body Water (lbs): 87.8 lbs Visceral Fat Rating : 15   Other Clinical Data RMR: 1670 Fasting: no Labs: no Today's Visit #:  22 Starting Date: 07/29/22    Objective:   PHYSICAL EXAM: Blood pressure 96/62, pulse 80, temperature 98.3 F (36.8 C), height 5' 6 (1.676 m), weight 254 lb (115.2 kg), SpO2 98%. Body mass index is 41 kg/m.  General: she is overweight, cooperative and in no acute distress. PSYCH: Has normal mood, affect and thought process.   HEENT: EOMI, sclerae are anicteric. Lungs: Normal breathing effort, no conversational dyspnea. Extremities: Moves * 4 Neurologic: A and O * 3, good insight  DIAGNOSTIC DATA REVIEWED: BMET    Component Value Date/Time   NA 145 03/31/2024 0000   K 4.8 03/31/2024 0000   CL 108 03/31/2024 0000   CO2 29 (A) 03/31/2024 0000   GLUCOSE 90 12/08/2023 1026   BUN 25 (A) 03/31/2024 0000   CREATININE 0.9 03/31/2024 0000   CREATININE 1.00 12/08/2023 1026   CREATININE 1.06 (H) 04/18/2021 0905   CALCIUM 9.5 03/31/2024 0000   GFRNONAA >60  12/08/2023 1026   GFRNONAA 57 (L) 04/18/2021 0905   GFRAA 66 04/18/2021 0905   Lab Results  Component Value Date   HGBA1C 5.2 01/24/2024   HGBA1C 6.0 11/20/2009   Lab Results  Component Value Date   INSULIN  8.2 07/29/2022   Lab Results  Component Value Date   TSH 1.02 03/31/2024   CBC    Component Value Date/Time   WBC 6.2 12/08/2023 1026   WBC 5.5 06/03/2023 1019   RBC 4.29 12/08/2023 1026   HGB 5.1 (A) 03/31/2024 0000   HGB 12.8 12/08/2023 1026   HGB 12.8 10/28/2023 0813   HCT 37.2 12/08/2023 1026   HCT 41.2 10/28/2023 0813   PLT 268 12/08/2023 1026   PLT 272 10/28/2023 0813   MCV 86.7 12/08/2023 1026   MCV 91 10/28/2023 0813   MCH 29.8 12/08/2023 1026   MCHC 34.4 12/08/2023 1026   RDW 13.8 12/08/2023 1026   RDW 14.6 10/28/2023 0813   Iron Studies No results found for: IRON, TIBC, FERRITIN, IRONPCTSAT Lipid Panel     Component Value Date/Time   CHOL 122 03/31/2024 0000   CHOL 129 07/29/2022 0930   TRIG 71 03/31/2024 0000   HDL 50 03/31/2024 0000   HDL 59 07/29/2022 0930   CHOLHDL 2.2 07/29/2022 0930   CHOLHDL 5.0 Ratio 10/07/2009 1913   VLDL 38 10/07/2009 1913   LDLCALC 58 03/31/2024 0000   LDLCALC 53 07/29/2022 0930   Hepatic Function Panel     Component Value Date/Time   PROT 7.6 12/08/2023 1026   PROT 7.2 10/28/2023 0813   ALBUMIN 4.2 12/08/2023 1026   ALBUMIN 4.3 10/28/2023 0813   AST 40 (A) 03/31/2024 0000   AST 34 12/08/2023 1026   ALT 70 (A) 03/31/2024 0000   ALT 55 (H) 12/08/2023 1026   ALKPHOS 58 03/31/2024 0000   BILITOT 0.4 12/08/2023 1026   BILIDIR <0.1 06/03/2023 1019   IBILI NOT CALCULATED 06/03/2023 1019      Component Value Date/Time   TSH 1.02 03/31/2024 0000   TSH 0.589 07/29/2022 0930   Nutritional Lab Results  Component Value Date   VD25OH 64.34 03/31/2024   VD25OH 76.6 01/24/2024   VD25OH 56.4 09/02/2023    Attestations:   I, Vernell Forest, acting as a Stage manager for Tonya Jenkins, DO., have  compiled all relevant documentation for today's office visit on behalf of Tonya Jenkins, DO, while in the presence of [employer], DO.  I have reviewed the above documentation for accuracy and completeness, and I agree with  the above. [name], D.O.  The 21st Century Cures Act was signed into law in 2016 which includes the topic of electronic health records.  This provides immediate access to information in MyChart.  This includes consultation notes, operative notes, office notes, lab results and pathology reports.  If you have any questions about what you read please let us  know at your next visit so we can discuss your concerns and take corrective action if need be.  We are right here with you.

## 2024-06-06 NOTE — Telephone Encounter (Signed)
 PA for [facility] 1MG  has been submitted, awaiting PA questions.

## 2024-06-07 NOTE — Telephone Encounter (Signed)
 PA questions for [facility] 1MG   have been answered and all documentation has been included. Waiting on a determination.

## 2024-06-07 NOTE — Telephone Encounter (Signed)
 PA for Wegovy  1MG  has been denied. PA is now complete.

## 2024-06-15 ENCOUNTER — Telehealth (INDEPENDENT_AMBULATORY_CARE_PROVIDER_SITE_OTHER): Payer: Self-pay | Admitting: Family Medicine

## 2024-06-15 NOTE — Telephone Encounter (Signed)
 Pt called in stating that the last time she was here Wegovy  was sent to the pharmacy instead of her normal Ozempic  but the pharmacy was able to give her the Ozempic . Pt also stated that Medicare sent her a letter stating that the Wegovy  was denied. Please follow up if needed.

## 2024-06-20 ENCOUNTER — Ambulatory Visit (INDEPENDENT_AMBULATORY_CARE_PROVIDER_SITE_OTHER): Admitting: Family Medicine

## 2024-06-23 ENCOUNTER — Other Ambulatory Visit (INDEPENDENT_AMBULATORY_CARE_PROVIDER_SITE_OTHER): Payer: Self-pay | Admitting: Family Medicine

## 2024-06-27 ENCOUNTER — Encounter (INDEPENDENT_AMBULATORY_CARE_PROVIDER_SITE_OTHER): Payer: Self-pay | Admitting: Family Medicine

## 2024-06-27 ENCOUNTER — Ambulatory Visit (INDEPENDENT_AMBULATORY_CARE_PROVIDER_SITE_OTHER): Admitting: Family Medicine

## 2024-06-27 DIAGNOSIS — Z6841 Body Mass Index (BMI) 40.0 and over, adult: Secondary | ICD-10-CM

## 2024-06-27 DIAGNOSIS — E1159 Type 2 diabetes mellitus with other circulatory complications: Secondary | ICD-10-CM

## 2024-06-27 DIAGNOSIS — Z7985 Long-term (current) use of injectable non-insulin antidiabetic drugs: Secondary | ICD-10-CM

## 2024-06-27 DIAGNOSIS — G8929 Other chronic pain: Secondary | ICD-10-CM

## 2024-06-27 DIAGNOSIS — I152 Hypertension secondary to endocrine disorders: Secondary | ICD-10-CM

## 2024-06-27 DIAGNOSIS — M5440 Lumbago with sciatica, unspecified side: Secondary | ICD-10-CM | POA: Diagnosis not present

## 2024-06-27 DIAGNOSIS — E1169 Type 2 diabetes mellitus with other specified complication: Secondary | ICD-10-CM

## 2024-06-27 MED ORDER — SEMAGLUTIDE (1 MG/DOSE) 4 MG/3ML ~~LOC~~ SOPN: 1.0000 mg | PEN_INJECTOR | SUBCUTANEOUS | 1 refills | Status: DC

## 2024-06-27 NOTE — Progress Notes (Signed)
 [name], D.O.  ABFM, ABOM Specializing in Clinical Bariatric Medicine  Office located at: [address] [place], [place]  [address]   Assessment and Plan:   Medications Discontinued During This Encounter  Medication Reason   Semaglutide , 1 MG/DOSE, 4 MG/3ML SOPN Reorder     Meds ordered this encounter  Medications   Semaglutide , 1 MG/DOSE, 4 MG/3ML SOPN    Sig: Inject 1 mg as directed once a week.    Dispense:  3 mL    Refill:  1     Obtain non fasting labs   FOR THE DISEASE OF OBESITY:  OBESITY, MORBID with starting BMI 45 BMI 40.0-44.9, adult (HCC), current BMI 41.66 Assessment & Plan: Since last office visit on 06/01/24 patient's muscle mass has increased by 1.2 lbs. Fat mass has increased by 3.2 lbs. Total body water has increased by 4.8 lbs.  Body fat % has increased by 0.4 %. Counseling done on how various foods will affect these numbers and how to maximize success  Total lbs lost to date: - 22 lbs Total weight loss percentage to date: -7.86 %  - Tracking Calories/Macros: no   - Eating More Whole Foods: yes  - Adequate Protein Intake: yes  - Adequate Water Intake: yes  - Skipping Meals: no   - Sleeping 7-9 Hours/ Night: yes   Recommended Dietary Goals [name] is currently in the action stage of change. As such, her goal is to continue weight management plan.  She has agreed to: Switch to Cat 1 MP    Behavioral Intervention We discussed the following today: increasing lean protein intake to established goals and using GPT or another AI platform for recipe ideas- searching low calorie, low carb, high protein chicken recipes etc  Additional resources provided today: Handout on CAT 1 meal plan Handout on [employer], Handout on 100 Calorie Snacks   Evidence-based interventions for health behavior change were utilized today including the discussion of self monitoring techniques, problem-solving barriers and SMART goal  setting techniques.   Regarding patient's less desirable eating habits and patterns, we employed the technique of small changes.   Goal(s) for next OV: n/a    Recommended Physical Activity Goals [name] has been advised to work up to 300-450 minutes of moderate intensity aerobic activity a week and strengthening exercises 2-3 times per week for cardiovascular health, weight loss maintenance and preservation of muscle mass.   She has agreed to: Think about enjoyable ways to increase daily physical activity and overcoming barriers to exercise and Increase physical activity in their day and reduce sedentary time (increase NEAT).   Pharmacotherapy We both agreed to: Continue current regimen.   ASSOCIATED CONDITIONS ADDRESSED TODAY:  Type 2 diabetes mellitus with obesity [facility]) Assessment & Plan Lab Results  Component Value Date   HGBA1C 5.2 01/24/2024   HGBA1C 5.4 10/28/2023   HGBA1C 5.3 07/22/2023   INSULIN  8.2 07/29/2022    On Ozempic  1 mg once weekly. Good compliance and tolerance. Pt states that hunger and cravings are well controlled. Encouraged to continue with lean protein intake to continue feeling less cravings and hunger. PCP will obtain labs on 07/09/24. Continue taking will refill today.    Chronic bilateral low back pain with sciatica, sciatica laterality unspecified Assessment & Plan Pt received a lumbar facet injection 05/31/24 which did help relieve her pain. Continue following up with ortho and PM&R as instructed by them. Will continue alongside PCP/specialists. Reminded pt that losing 5-10% of  her body weight will likely improve her condition. Recommended to ask about physical therapy to help as well.    Hypertension associated with type 2 diabetes mellitus [facility]) Assessment & Plan BP Readings from Last 3 Encounters:  06/27/24 102/67  06/01/24 96/62  05/31/24 97/67   Currently on Lisinopril 5 mg once daily,and Lasix 20 mg once daily.BP is on the low side today.  Pt denies any dizziness or lightheadedness. Pt mentions that she stopped taking Metoprolol  25 mg once daily. Pt has been recording and checking her BP at home as well as pulse. Reports as follows  128/88 with pulse of  63 131/81 with pulse of  65  130/92 with pulse of 68  122/84 with pulse of 68  Lowest BP she recorded at home was 107/84 with pulse of 73.Continue taking medications. Increase exercise as tolerated.     Follow up:   07/19/24 at 9:20 AM She was informed of the importance of frequent follow up visits to maximize her success with intensive lifestyle modifications for her multiple health conditions.    Subjective:   Chief complaint: Obesity [name] is here to discuss her progress with her obesity treatment plan. She is on the Category 2 Plan and states she is following her eating plan approximately 80% of the time. Pt is walking and dancing  30 minutes 3 days per week    Interval History:  [name] is here for a follow up office visit. Pt has experienced a weight gain of 4 lbs since last OV on 06/01/24. Pt states that she is doing good. She still has back and hip pain but is slowly but surely getting back to moving around. She endorses snacking a little more than usual on apples, pears and bananas but has been working on decreasing snacking. She has been busy since her daughter has a baby and has been helping her since then.     Pharmacotherapy that aid with weight loss: She is currently taking Ozempic  1 mg once weekly .    Review of Systems:  Pertinent positives were addressed with patient today.  Reviewed by clinician on day of visit: allergies, medications, problem list, medical history, surgical history, family history, social history, and previous encounter notes.  Weight Summary and Biometrics   Weight Lost Since Last Visit: 0lb  Weight Gained Since Last Visit: 4lb   Vitals Temp: 98.1 F (36.7 C) BP: 102/67 Pulse Rate: 64 SpO2: 97  %   Anthropometric Measurements Height: 5' 6 (1.676 m) Weight: 258 lb (117 kg) BMI (Calculated): 41.66 Weight at Last Visit: 254lb Weight Lost Since Last Visit: 0lb Weight Gained Since Last Visit: 4lb Starting Weight: 280lb Total Weight Loss (lbs): 22 lb (9.979 kg) Peak Weight: 330lb   Body Composition  Body Fat %: 45.6 % Fat Mass (lbs): 118 lbs Muscle Mass (lbs): 133.6 lbs Total Body Water (lbs): 92.6 lbs Visceral Fat Rating : 15   Other Clinical Data Fasting: no Labs: no Today's Visit #: 23 Starting Date: 07/29/22    Objective:   PHYSICAL EXAM: Blood pressure 102/67, pulse 64, temperature 98.1 F (36.7 C), height 5' 6 (1.676 m), weight 258 lb (117 kg), SpO2 97%. Body mass index is 41.64 kg/m.  General: she is overweight, cooperative and in no acute distress. PSYCH: Has normal mood, affect and thought process.   HEENT: EOMI, sclerae are anicteric. Lungs: Normal breathing effort, no conversational dyspnea. Extremities: Moves * 4 Neurologic: A and O * 3, good insight  DIAGNOSTIC  DATA REVIEWED: BMET    Component Value Date/Time   NA 145 03/31/2024 0000   K 4.8 03/31/2024 0000   CL 108 03/31/2024 0000   CO2 29 (A) 03/31/2024 0000   GLUCOSE 90 12/08/2023 1026   BUN 25 (A) 03/31/2024 0000   CREATININE 0.9 03/31/2024 0000   CREATININE 1.00 12/08/2023 1026   CREATININE 1.06 (H) 04/18/2021 0905   CALCIUM 9.5 03/31/2024 0000   GFRNONAA >60 12/08/2023 1026   GFRNONAA 57 (L) 04/18/2021 0905   GFRAA 66 04/18/2021 0905   Lab Results  Component Value Date   HGBA1C 5.2 01/24/2024   HGBA1C 6.0 11/20/2009   Lab Results  Component Value Date   INSULIN  8.2 07/29/2022   Lab Results  Component Value Date   TSH 1.02 03/31/2024   CBC    Component Value Date/Time   WBC 6.2 12/08/2023 1026   WBC 5.5 06/03/2023 1019   RBC 4.29 12/08/2023 1026   HGB 5.1 (A) 03/31/2024 0000   HGB 12.8 12/08/2023 1026   HGB 12.8 10/28/2023 0813   HCT 37.2 12/08/2023 1026    HCT 41.2 10/28/2023 0813   PLT 268 12/08/2023 1026   PLT 272 10/28/2023 0813   MCV 86.7 12/08/2023 1026   MCV 91 10/28/2023 0813   MCH 29.8 12/08/2023 1026   MCHC 34.4 12/08/2023 1026   RDW 13.8 12/08/2023 1026   RDW 14.6 10/28/2023 0813   Iron Studies No results found for: IRON, TIBC, FERRITIN, IRONPCTSAT Lipid Panel     Component Value Date/Time   CHOL 122 03/31/2024 0000   CHOL 129 07/29/2022 0930   TRIG 71 03/31/2024 0000   HDL 50 03/31/2024 0000   HDL 59 07/29/2022 0930   CHOLHDL 2.2 07/29/2022 0930   CHOLHDL 5.0 Ratio 10/07/2009 1913   VLDL 38 10/07/2009 1913   LDLCALC 58 03/31/2024 0000   LDLCALC 53 07/29/2022 0930   Hepatic Function Panel     Component Value Date/Time   PROT 7.6 12/08/2023 1026   PROT 7.2 10/28/2023 0813   ALBUMIN 4.2 12/08/2023 1026   ALBUMIN 4.3 10/28/2023 0813   AST 40 (A) 03/31/2024 0000   AST 34 12/08/2023 1026   ALT 70 (A) 03/31/2024 0000   ALT 55 (H) 12/08/2023 1026   ALKPHOS 58 03/31/2024 0000   BILITOT 0.4 12/08/2023 1026   BILIDIR <0.1 06/03/2023 1019   IBILI NOT CALCULATED 06/03/2023 1019      Component Value Date/Time   TSH 1.02 03/31/2024 0000   TSH 0.589 07/29/2022 0930   Nutritional Lab Results  Component Value Date   VD25OH 64.34 03/31/2024   VD25OH 76.6 01/24/2024   VD25OH 56.4 09/02/2023    Attestations:   I, [name], acting as a Stage manager for Tonya Jenkins, DO., have compiled all relevant documentation for today's office visit on behalf of Tonya Jenkins, DO, while in the presence of [employer], DO.    I have reviewed the above documentation for accuracy and completeness, and I agree with the above. [name], D.O.  The 21st Century Cures Act was signed into law in 2016 which includes the topic of electronic health records.  This provides immediate access to information in MyChart.  This includes consultation notes, operative notes, office notes, lab results and pathology  reports.  If you have any questions about what you read please let us  know at your next visit so we can discuss your concerns and take corrective action if need be.  We are right here  with you.

## 2024-07-10 LAB — BASIC METABOLIC PANEL WITH GFR
BUN: 17 (ref 4–21)
CO2: 32 — AB (ref 13–22)
Chloride: 105 (ref 99–108)
Creatinine: 0.8 (ref 0.5–1.1)
Glucose: 74
Potassium: 4.5 meq/L (ref 3.5–5.1)

## 2024-07-10 LAB — LIPID PANEL
Cholesterol: 127 (ref 0–200)
HDL: 62 (ref 35–70)
LDL Cholesterol: 48
LDl/HDL Ratio: 17
Triglycerides: 83 (ref 40–160)

## 2024-07-10 LAB — HEPATIC FUNCTION PANEL
ALT: 42 U/L — AB (ref 7–35)
AST: 39 — AB (ref 13–35)
Alkaline Phosphatase: 58 (ref 25–125)
Bilirubin, Total: 0.5

## 2024-07-10 LAB — COMPREHENSIVE METABOLIC PANEL WITH GFR
Albumin: 4.6 (ref 3.5–5.0)
Calcium: 9.6 (ref 8.7–10.7)
Globulin: 3.3
eGFR: 72

## 2024-07-10 LAB — CBC AND DIFFERENTIAL
HCT: 40 (ref 36–46)
Hemoglobin: 13.5 (ref 12.0–16.0)
Platelets: 270 K/uL (ref 150–400)
WBC: 5.3

## 2024-07-10 LAB — CBC: RBC: 4.5 (ref 3.87–5.11)

## 2024-07-10 LAB — TSH: TSH: 0.86 (ref 0.41–5.90)

## 2024-07-10 LAB — HEMOGLOBIN A1C: Hemoglobin A1C: 5.4

## 2024-07-19 ENCOUNTER — Ambulatory Visit (INDEPENDENT_AMBULATORY_CARE_PROVIDER_SITE_OTHER): Admitting: Family Medicine

## 2024-07-19 ENCOUNTER — Encounter (INDEPENDENT_AMBULATORY_CARE_PROVIDER_SITE_OTHER): Payer: Self-pay | Admitting: Family Medicine

## 2024-07-19 VITALS — BP 113/70 | HR 73 | Temp 98.4°F | Ht 66.0 in | Wt 252.0 lb

## 2024-07-19 DIAGNOSIS — E1169 Type 2 diabetes mellitus with other specified complication: Secondary | ICD-10-CM | POA: Diagnosis not present

## 2024-07-19 DIAGNOSIS — I152 Hypertension secondary to endocrine disorders: Secondary | ICD-10-CM

## 2024-07-19 DIAGNOSIS — E1159 Type 2 diabetes mellitus with other circulatory complications: Secondary | ICD-10-CM

## 2024-07-19 DIAGNOSIS — Z6841 Body Mass Index (BMI) 40.0 and over, adult: Secondary | ICD-10-CM

## 2024-07-19 DIAGNOSIS — E559 Vitamin D deficiency, unspecified: Secondary | ICD-10-CM

## 2024-07-19 DIAGNOSIS — Z7985 Long-term (current) use of injectable non-insulin antidiabetic drugs: Secondary | ICD-10-CM

## 2024-07-19 NOTE — Progress Notes (Signed)
 [name], D.O.  ABFM, ABOM Specializing in Clinical Bariatric Medicine  Office located at: 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; BMI 40.0-44.9, adult (HCC), current BMI 40.69  Weight Summary and Body Composition Analysis  Weight Lost Since Last Visit: 6lb  Weight Gained Since Last Visit: 0lb    Vitals Temp: 98.4 F (36.9 C) BP: 113/70 Pulse Rate: 73 SpO2: 97 %   Anthropometric Measurements Height: 5' 6 (1.676 m) Weight: 252 lb (114.3 kg) BMI (Calculated): 40.69 Weight at Last Visit: 258lb Weight Lost Since Last Visit: 6lb Weight Gained Since Last Visit: 0lb Starting Weight: 280lb Total Weight Loss (lbs): 28 lb (12.7 kg) Peak Weight: 330lb   Body Composition  Body Fat %: 47.3 % Fat Mass (lbs): 119.2 lbs Muscle Mass (lbs): 126.2 lbs Total Body Water (lbs): 85.4 lbs Visceral Fat Rating : 16   Other Clinical Data Fasting: no Labs: yes Today's Visit #: 24 Starting Date: 07/29/22    Chief complaint: Obesity  Interval History [name] is here for a follow-up office visit to discuss her progress with her obesity treatment plan. She is on the Category 1 Plan and states she is following her eating plan approximately 75 % of the time. She is walking 15  minutes 3 days per week  She has experienced a weight loss of 6 lbs since last OV on 06/27/2024.   She enjoyed her birthday on 07/10/2025; went to the club and danced!   Her dietary and life habits include:  - We switched her to the CAT 1 MP LOV, she likes this plan.   - Tracking Calories/Macros: no, she is not on a food journaling plan yet.   - Eating More Whole Foods: yes, she is getting in more fiber and vegetables.   - Has been snacking less on cookies, etc.   - Adequate Protein Intake:  struggled a bit to get in her protein intake at times  - Adequate Water Intake: yes  - Skipping Meals:  yes  - Sleeping 7-9 Hours/ Night: yes    06/27/24 11:00 07/19/24 09:00   Body Fat % 45.6 % 47.3 %  Muscle Mass (lbs) 133.6 lbs 126.2 lbs  Fat Mass (lbs) 118 lbs 119.2 lbs  Total Body Water (lbs) 92.6 lbs 85.4 lbs  Visceral Fat Rating  15 16   Counseling done on how various foods will affect these numbers and how to maximize success. Discussed how to prevent decrease in muscle mass and increase in fat mass.   Total lbs lost to date: -28 lbs Total Fat Mass lost to date: -26 lbs Total weight loss percentage to date: -10 %   Nutritional and Behavioral Counseling:  We discussed the following today: increasing lean protein intake to established goals, decreasing simple carbohydrates , and work on tracking and journaling calories using tracking application  Additional resources provided today: Physician provided patient with handouts and personalized instruction on tracking and journaling using Apps (or how to handwrite in notebook) and using logs provided   Evidence-based interventions for health behavior change were utilized today including the discussion of self monitoring techniques, problem-solving barriers and SMART goal setting techniques.   Regarding patient's less desirable eating habits and patterns, we employed the technique of small changes.   SMART Goal(s) created today: n/a   Recommended Dietary Goals [name] is currently in the action stage of change.  As such, her goal is to continue weight management plan.  She has agreed to START journaling 1000-1100 cal and 85+ grams protein daily.   Recommended Physical Activity Goals [name] has been advised to work up to 300-450 minutes of moderate intensity aerobic activity a week and strengthening exercises 2-3 times per week for cardiovascular health, weight loss maintenance and preservation of muscle mass.   She has agreed to: Continue to gradually increase the amount and intensity of exercise routine   Medical  Interventions and Pharmacotherapy Previous Bariatric surgery: n/a Pharmacotherapy for weight loss: maintain with Semaglutide  1 mg wkly    OBESITY RELATED CONDITIONS ADDRESSED TODAY:    She had labs with [facility] on 9/29; pt instructed to bring copy of labs to next OV.   Type 2 diabetes mellitus with other specified complication, without long-term current use of insulin  [facility]) Assessment & Plan: Lab Results  Component Value Date   HGBA1C 5.2 01/24/2024   HGBA1C 5.4 10/28/2023   HGBA1C 5.3 07/22/2023   INSULIN  8.2 07/29/2022    Denies symptoms of hypoglycemia or hyperglycemia. Currently on Ozempic  1 mg weekly. Good compliance/tolerance. Pt is asymptomatic and denies any GI upset or other adverse SE. Well controlled hunger/cravings. Cont medicine at same dose and begin journaling plan with a focus on protein, fruits, and vegetables while limiting simple carbohydrates. She may cont to increase exercise as tolerated.    Hypertension associated with type 2 diabetes mellitus [facility]) Assessment & Plan: BP Readings from Last 3 Encounters:  07/19/24 113/70  06/27/24 102/67  06/01/24 96/62   The ASCVD Risk score (Arnett DK, et al., 2019) failed to calculate for the following reasons:   The valid total cholesterol range is 130 to 320 mg/dL  Lab Results  Component Value Date   CREATININE 0.9 03/31/2024   BP at goal today. Pt asx. BP at home averages: 130s-140s/60s-80s. Lowest BP reading at home was 134/63. Cont BP monitoring, Zestril 5 mg daily and implementation of low sodium meal plan.     Vitamin D  deficiency Assessment & Plan: Lab Results  Component Value Date   VD25OH 64.34 03/31/2024   VD25OH 76.6 01/24/2024   VD25OH 56.4 09/02/2023   Doing well on OTC Vit D 2,000 units daily. No acute concerns. Continue supplementation. She may have had Vit D obtained with [name] on 9/29 - will wait for her to bring copy of labs. Will obtain in near future if it was not checked  at that time.     Follow up:   Return 08/16/2024 at 10:20 AM.  She was informed of the importance of frequent follow up visits to maximize her success with intensive lifestyle modifications for her multiple health conditions.   Objective:   PHYSICAL EXAM: Blood pressure 113/70, pulse 73, temperature 98.4 F (36.9 C), height 5' 6 (1.676 m), weight 252 lb (114.3 kg), SpO2 97%. Body mass index is 40.67 kg/m.  General: she is overweight, cooperative and in no acute distress. PSYCH: Has normal mood, affect and thought process.   HEENT: EOMI, sclerae are anicteric. Lungs: Normal breathing effort, no conversational dyspnea. Extremities: Moves * 4 Neurologic: A and O * 3, good insight  DIAGNOSTIC DATA REVIEWED: BMET    Component Value Date/Time   NA 145 03/31/2024 0000   K 4.8 03/31/2024 0000   CL 108 03/31/2024 0000   CO2 29 (A) 03/31/2024 0000   GLUCOSE 90 12/08/2023 1026   BUN 25 (A) 03/31/2024 0000   CREATININE 0.9 03/31/2024 0000  CREATININE 1.00 12/08/2023 1026   CREATININE 1.06 (H) 04/18/2021 0905   CALCIUM 9.5 03/31/2024 0000   GFRNONAA >60 12/08/2023 1026   GFRNONAA 57 (L) 04/18/2021 0905   GFRAA 66 04/18/2021 0905   Lab Results  Component Value Date   HGBA1C 5.2 01/24/2024   HGBA1C 6.0 11/20/2009   Lab Results  Component Value Date   INSULIN  8.2 07/29/2022   Lab Results  Component Value Date   TSH 1.02 03/31/2024   CBC    Component Value Date/Time   WBC 6.2 12/08/2023 1026   WBC 5.5 06/03/2023 1019   RBC 4.29 12/08/2023 1026   HGB 5.1 (A) 03/31/2024 0000   HGB 12.8 12/08/2023 1026   HGB 12.8 10/28/2023 0813   HCT 37.2 12/08/2023 1026   HCT 41.2 10/28/2023 0813   PLT 268 12/08/2023 1026   PLT 272 10/28/2023 0813   MCV 86.7 12/08/2023 1026   MCV 91 10/28/2023 0813   MCH 29.8 12/08/2023 1026   MCHC 34.4 12/08/2023 1026   RDW 13.8 12/08/2023 1026   RDW 14.6 10/28/2023 0813   Iron Studies No results found for: IRON, TIBC, FERRITIN,  IRONPCTSAT Lipid Panel     Component Value Date/Time   CHOL 122 03/31/2024 0000   CHOL 129 07/29/2022 0930   TRIG 71 03/31/2024 0000   HDL 50 03/31/2024 0000   HDL 59 07/29/2022 0930   CHOLHDL 2.2 07/29/2022 0930   CHOLHDL 5.0 Ratio 10/07/2009 1913   VLDL 38 10/07/2009 1913   LDLCALC 58 03/31/2024 0000   LDLCALC 53 07/29/2022 0930   Hepatic Function Panel     Component Value Date/Time   PROT 7.6 12/08/2023 1026   PROT 7.2 10/28/2023 0813   ALBUMIN 4.2 12/08/2023 1026   ALBUMIN 4.3 10/28/2023 0813   AST 40 (A) 03/31/2024 0000   AST 34 12/08/2023 1026   ALT 70 (A) 03/31/2024 0000   ALT 55 (H) 12/08/2023 1026   ALKPHOS 58 03/31/2024 0000   BILITOT 0.4 12/08/2023 1026   BILIDIR <0.1 06/03/2023 1019   IBILI NOT CALCULATED 06/03/2023 1019      Component Value Date/Time   TSH 1.02 03/31/2024 0000   TSH 0.589 07/29/2022 0930   Nutritional Lab Results  Component Value Date   VD25OH 64.34 03/31/2024   VD25OH 76.6 01/24/2024   VD25OH 56.4 09/02/2023    Attestations:   I, Special Puri, acting as a Stage manager for [employer], DO., have compiled all relevant documentation for today's office visit on behalf of Tonya Jenkins, DO, while in the presence of [employer], DO.  Pertinent positives were addressed with patient today. Reviewed by clinician on day of visit: allergies, medications, problem list, medical history, surgical history, family history, social history, and previous encounter notes.  I have reviewed the above documentation for accuracy and completeness, and I agree with the above. [name], D.O.  The 21st Century Cures Act was signed into law in 2016 which includes the topic of electronic health records.  This provides immediate access to information in MyChart. This includes consultation notes, operative notes, office notes, lab results and pathology reports.  If you have any questions about what you read please let us  know at your next  visit so we can discuss your concerns and take corrective action if need be.  We are right here with you.

## 2024-08-14 ENCOUNTER — Encounter: Payer: Self-pay | Admitting: Radiology

## 2024-08-16 ENCOUNTER — Encounter (INDEPENDENT_AMBULATORY_CARE_PROVIDER_SITE_OTHER): Payer: Self-pay | Admitting: Family Medicine

## 2024-08-16 ENCOUNTER — Ambulatory Visit (INDEPENDENT_AMBULATORY_CARE_PROVIDER_SITE_OTHER): Payer: Self-pay | Admitting: Family Medicine

## 2024-08-16 ENCOUNTER — Encounter (INDEPENDENT_AMBULATORY_CARE_PROVIDER_SITE_OTHER): Payer: Self-pay | Admitting: *Deleted

## 2024-08-16 VITALS — BP 105/66 | HR 67 | Temp 98.2°F | Ht 66.0 in | Wt 248.0 lb

## 2024-08-16 DIAGNOSIS — Z7985 Long-term (current) use of injectable non-insulin antidiabetic drugs: Secondary | ICD-10-CM

## 2024-08-16 DIAGNOSIS — Z6841 Body Mass Index (BMI) 40.0 and over, adult: Secondary | ICD-10-CM

## 2024-08-16 DIAGNOSIS — E559 Vitamin D deficiency, unspecified: Secondary | ICD-10-CM

## 2024-08-16 DIAGNOSIS — I152 Hypertension secondary to endocrine disorders: Secondary | ICD-10-CM

## 2024-08-16 DIAGNOSIS — E1159 Type 2 diabetes mellitus with other circulatory complications: Secondary | ICD-10-CM

## 2024-08-16 DIAGNOSIS — E669 Obesity, unspecified: Secondary | ICD-10-CM

## 2024-08-16 DIAGNOSIS — E65 Localized adiposity: Secondary | ICD-10-CM

## 2024-08-16 MED ORDER — VITAMIN D3 50 MCG (2000 UT) PO CAPS: 2000.0000 [IU] | ORAL_CAPSULE | Freq: Every day | ORAL | Status: AC

## 2024-08-16 MED ORDER — SEMAGLUTIDE (1 MG/DOSE) 4 MG/3ML ~~LOC~~ SOPN: 1.0000 mg | PEN_INJECTOR | SUBCUTANEOUS | 1 refills | Status: DC

## 2024-08-16 NOTE — Progress Notes (Signed)
 [name], D.O.  ABFM, ABOM Specializing in Clinical Bariatric Medicine  Office located at: 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 BMI 40.0-44.9, adult (HCC), current BMI 40.69  Weight Summary and Body Composition Analysis  Weight Lost Since Last Visit: 4lb  Weight Gained Since Last Visit: 0lb    Vitals Temp: 98.2 F (36.8 C) BP: 105/66 Pulse Rate: 67 SpO2: 95 %   Anthropometric Measurements Height: 5' 6 (1.676 m) Weight: 248 lb (112.5 kg) BMI (Calculated): 40.05 Weight at Last Visit: 252lb Weight Lost Since Last Visit: 4lb Weight Gained Since Last Visit: 0lb Starting Weight: 280lb Total Weight Loss (lbs): 32 lb (14.5 kg) Peak Weight: 330lb   Body Composition  Body Fat %: 46.9 % Fat Mass (lbs): 116.6 lbs Muscle Mass (lbs): 125.4 lbs Total Body Water (lbs): 85.6 lbs Visceral Fat Rating : 15   Other Clinical Data Fasting: no Labs: no Today's Visit #: 25 Starting Date: 07/29/22     Chief complaint: Obesity  Interval History [name] is here for a follow-up office visit to discuss her progress with her obesity treatment plan. She is keeping a food journal and adhering to recommended goals of 1000 - 1100  calories and 85+ grams  protein and states she is following her eating plan approximately 50 % of the time. She is walking or dancing 30  minutes 4 days per week  She has experienced a weight loss of 4 lbs since last OV on 07/19/2024.   She recently returned from a 2 week cruise vacation. States she did not eat on-plan on vacation but walked 15,000 steps daily.  Her typical dietary and life habits include:  - Tracking Calories/Macros: she journaled the week prior to going on vacation and adhered to her journaling parameters the majority of the time.  - Eating More Whole Foods: yes  - Adequate Protein Intake: yes  - Adequate Water Intake:  no  - Skipping Meals: yes  - Sleeping 7-9 Hours/ Night: yes    07/19/24 09:00 08/16/24 09:00   Body Fat % 47.3 % 46.9 %  Muscle Mass (lbs) 126.2 lbs 125.4 lbs  Fat Mass (lbs) 119.2 lbs 116.6 lbs  Total Body Water (lbs) 85.4 lbs 85.6 lbs  Visceral Fat Rating  16 15   Counseling done on how various foods will affect these numbers and how to maximize success  Total Fat mass lost to date: - 28.6 lbs Total lbs lost to date: - 32 lbs Total weight loss percentage to date: - 11.43 %   Nutritional and Behavioral Counseling:  We discussed the following today: low calorie spaghetti sauce, high protein pasta/spaghetti options, focusing on food with a 10:1 ratio of calories: grams of protein, work on tracking and journaling calories using tracking application, and continue to work on implementation of reduced calorie nutritional plan  Additional resources provided today: Handout on Daily Food Journaling Log  Evidence-based interventions for health behavior change were utilized today including the discussion of self monitoring techniques, problem-solving barriers and SMART goal setting techniques.   Regarding patient's less desirable eating habits and patterns, we employed the technique of small changes.   SMART Goal(s) created today: n/a   Recommended Dietary Goals [name] is currently in the action stage of change. As such, her goal is to continue weight management plan.  She has agreed to continue journaling 1000-1100 cal and  85+ grams protein daily.    Recommended Physical Activity Goals [name] has been advised to work up to 300-450 minutes of moderate intensity aerobic activity a week and strengthening exercises 2-3 times per week for cardiovascular health, weight loss maintenance and preservation of muscle mass.   She has agreed to: Continue to gradually increase the amount and intensity of exercise routine   Medical Interventions and Pharmacotherapy Previous Bariatric  surgery: n/a Pharmacotherapy: maintain with Semaglutide  1 mg wkly    OBESITY RELATED CONDITIONS ADDRESSED TODAY:   Medications Discontinued During This Encounter  Medication Reason   Cholecalciferol (VITAMIN D3) 50 MCG (2000 UT) capsule Reorder   Semaglutide , 1 MG/DOSE, 4 MG/3ML SOPN Reorder     Meds ordered this encounter  Medications   Semaglutide , 1 MG/DOSE, 4 MG/3ML SOPN    Sig: Inject 1 mg as directed once a week.    Dispense:  3 mL    Refill:  1   Cholecalciferol (VITAMIN D3) 50 MCG (2000 UT) capsule    Sig: Take 1 capsule (2,000 Units total) by mouth daily. ONLY TAKE ONCE OUT OF ERGOCAL     Type 2 diabetes mellitus in patient with obesity [facility]) Assessment & Plan: Denies symptoms of hypoglycemia or hyperglycemia. On Ozempic  1 mg weekly. Good compliance/tolerance. Pt is asymptomatic and denies any GI upset or other adverse SE. Well controlled hunger/cravings. Reviewed 9/29 [facility] labs with pt. A1c excellent control at 5.4. Kidney function and electrolytes are WNL. Cont medicine at same dose and journaling plan with a focus on protein, fruits, and vegetables while limiting simple carbohydrates. She may cont to increase exercise as tolerated.     Hypertension associated with type 2 diabetes mellitus (HCC) Assessment & Plan: BP Readings from Last 3 Encounters:  08/16/24 105/66  07/19/24 113/70  06/27/24 102/67   BP low normal today. Pt asx and feels completely fine. States BP at home is well controlled. Cont BP monitoring, Zestril 5 mg daily and implementation of low sodium meal plan.     Vitamin D  deficiency Assessment & Plan: Most recent Vit D level (checked with [facility] medical 9/29) is 72.93, which is essentially at goal of 50 to 70. Continue OTC Vit D 2,000 units daily. Recheck as deemed medically necessary.    Visceral obesity Assessment & Plan: Current visceral fat rating: 15.  The visceral fat rating should be < 12 in a female. Visceral adipose tissue  is a hormonally active component of total body fat. This body composition phenotype is associated with medical disorders such as metabolic syndrome, cardiovascular disease and several malignancies including prostate, breast, and colorectal cancers. Continue goal of losing 7-10% of weight via prudent nutritional plan and lifestyle changes.     Objective:   PHYSICAL EXAM: Blood pressure 105/66, pulse 67, temperature 98.2 F (36.8 C), height 5' 6 (1.676 m), weight 248 lb (112.5 kg), SpO2 95%. Body mass index is 40.03 kg/m.  General: she is overweight, cooperative and in no acute distress. PSYCH: Has normal mood, affect and thought process.   HEENT: EOMI, sclerae are anicteric. Lungs: Normal breathing effort, no conversational dyspnea. Extremities: Moves * 4 Neurologic: A and O * 3, good insight  DIAGNOSTIC DATA REVIEWED: BMET    Component Value Date/Time   NA 145 03/31/2024 0000   K 4.8 03/31/2024 0000   CL 108 03/31/2024 0000   CO2 29 (A) 03/31/2024 0000   GLUCOSE 90 12/08/2023 1026   BUN 25 (A) 03/31/2024 0000   CREATININE 0.9 03/31/2024 0000  CREATININE 1.00 12/08/2023 1026   CREATININE 1.06 (H) 04/18/2021 0905   CALCIUM 9.5 03/31/2024 0000   GFRNONAA >60 12/08/2023 1026   GFRNONAA 57 (L) 04/18/2021 0905   GFRAA 66 04/18/2021 0905   Lab Results  Component Value Date   HGBA1C 5.2 01/24/2024   HGBA1C 6.0 11/20/2009   Lab Results  Component Value Date   INSULIN  8.2 07/29/2022   Lab Results  Component Value Date   TSH 1.02 03/31/2024   CBC    Component Value Date/Time   WBC 6.2 12/08/2023 1026   WBC 5.5 06/03/2023 1019   RBC 4.29 12/08/2023 1026   HGB 5.1 (A) 03/31/2024 0000   HGB 12.8 12/08/2023 1026   HGB 12.8 10/28/2023 0813   HCT 37.2 12/08/2023 1026   HCT 41.2 10/28/2023 0813   PLT 268 12/08/2023 1026   PLT 272 10/28/2023 0813   MCV 86.7 12/08/2023 1026   MCV 91 10/28/2023 0813   MCH 29.8 12/08/2023 1026   MCHC 34.4 12/08/2023 1026   RDW 13.8  12/08/2023 1026   RDW 14.6 10/28/2023 0813   Iron Studies No results found for: IRON, TIBC, FERRITIN, IRONPCTSAT Lipid Panel     Component Value Date/Time   CHOL 122 03/31/2024 0000   CHOL 129 07/29/2022 0930   TRIG 71 03/31/2024 0000   HDL 50 03/31/2024 0000   HDL 59 07/29/2022 0930   CHOLHDL 2.2 07/29/2022 0930   CHOLHDL 5.0 Ratio 10/07/2009 1913   VLDL 38 10/07/2009 1913   LDLCALC 58 03/31/2024 0000   LDLCALC 53 07/29/2022 0930   Hepatic Function Panel     Component Value Date/Time   PROT 7.6 12/08/2023 1026   PROT 7.2 10/28/2023 0813   ALBUMIN 4.2 12/08/2023 1026   ALBUMIN 4.3 10/28/2023 0813   AST 40 (A) 03/31/2024 0000   AST 34 12/08/2023 1026   ALT 70 (A) 03/31/2024 0000   ALT 55 (H) 12/08/2023 1026   ALKPHOS 58 03/31/2024 0000   BILITOT 0.4 12/08/2023 1026   BILIDIR <0.1 06/03/2023 1019   IBILI NOT CALCULATED 06/03/2023 1019      Component Value Date/Time   TSH 1.02 03/31/2024 0000   TSH 0.589 07/29/2022 0930   Nutritional Lab Results  Component Value Date   VD25OH 64.34 03/31/2024   VD25OH 76.6 01/24/2024   VD25OH 56.4 09/02/2023     Follow up:   Return 09/13/2024 at 9:40 AM.  She was informed of the importance of frequent follow up visits to maximize her success with intensive lifestyle modifications for her multiple health conditions.   Attestations:   I, Special Puri, acting as a stage manager for [employer], DO., have compiled all relevant documentation for today's office visit on behalf of Tonya Jenkins, DO, while in the presence of [employer], DO.  Pertinent positives were addressed with patient today. Reviewed by clinician on day of visit: allergies, medications, problem list, medical history, surgical history, family history, social history, and previous encounter notes.  I have reviewed the above documentation for accuracy and completeness, and I agree with the above. Tonya JINNY Morgan, D.O.  The 21st Century Cures  Act was signed into law in 2016 which includes the topic of electronic health records.  This provides immediate access to information in MyChart. This includes consultation notes, operative notes, office notes, lab results and pathology reports.  If you have any questions about what you read please let us  know at your next visit so we can discuss your concerns and take  corrective action if need be.  We are right here with you.

## 2024-09-13 ENCOUNTER — Encounter (INDEPENDENT_AMBULATORY_CARE_PROVIDER_SITE_OTHER): Payer: Self-pay | Admitting: Family Medicine

## 2024-09-13 ENCOUNTER — Ambulatory Visit (INDEPENDENT_AMBULATORY_CARE_PROVIDER_SITE_OTHER): Payer: Self-pay | Admitting: Family Medicine

## 2024-09-13 ENCOUNTER — Telehealth (INDEPENDENT_AMBULATORY_CARE_PROVIDER_SITE_OTHER): Payer: Self-pay | Admitting: *Deleted

## 2024-09-13 VITALS — BP 104/70 | HR 69 | Temp 98.1°F | Ht 66.0 in | Wt 246.0 lb

## 2024-09-13 DIAGNOSIS — Z6839 Body mass index (BMI) 39.0-39.9, adult: Secondary | ICD-10-CM

## 2024-09-13 DIAGNOSIS — E669 Obesity, unspecified: Secondary | ICD-10-CM

## 2024-09-13 MED ORDER — SEMAGLUTIDE-WEIGHT MANAGEMENT 1.7 MG/0.75ML ~~LOC~~ SOAJ: 1.7000 mg | SUBCUTANEOUS | 1 refills | Status: DC

## 2024-09-13 MED ORDER — SEMAGLUTIDE (2 MG/DOSE) 8 MG/3ML ~~LOC~~ SOPN: 2.0000 mg | PEN_INJECTOR | SUBCUTANEOUS | 0 refills | Status: DC

## 2024-09-13 NOTE — Progress Notes (Signed)
 [name], D.O.  ABFM, ABOM Specializing in Clinical Bariatric Medicine  Office located at: 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 BMI 39.0-39.9,adult - current BMI 39.72  Weight Summary and Body Composition Analysis  Weight Lost Since Last Visit: 2 lb  Weight Gained Since Last Visit: 0    Vitals Temp: 98.1 F (36.7 C) BP: 104/70 Pulse Rate: 69 SpO2: 98 %   Anthropometric Measurements Height: 5' 6 (1.676 m) Weight: 246 lb (111.6 kg) BMI (Calculated): 39.72 Weight at Last Visit: 248 lb Weight Lost Since Last Visit: 2 lb Weight Gained Since Last Visit: 0 Starting Weight: 280 lb Total Weight Loss (lbs): 34 lb (15.4 kg) Peak Weight: 330 lb   Body Composition  Body Fat %: 46.6 % Fat Mass (lbs): 114.8 lbs Muscle Mass (lbs): 125 lbs Total Body Water (lbs): 82.8 lbs Visceral Fat Rating : 15   Other Clinical Data Fasting: No Labs: No Today's Visit #: 26 Starting Date: 07/29/22    Chief complaint: Obesity  Interval History [name] is here for a follow-up office visit to discuss her progress with her obesity treatment plan. She is keeping a food journal and adhering to recommended goals of 1000-1100 calories and 85+ grams  protein and states she is following her eating plan approximately 50 % of the time. She is walking 30  minutes  2 days per week  She has experienced a weight loss of 2 lbs since last OV on 08/16/2024.   Her dietary and life habits include:  - Tracking Calories/Macros: yes - averages 1000-1100 cal per day  - Eating More Whole Foods: yes  - Adequate Protein Intake: yes  - Adequate Water Intake: yes  - Skipping Meals: no  - Sleeping 7-9 Hours/ Night: yes    08/16/24 09:00 09/13/24 09:00   Body Fat % 46.9 % 46.6 %  Muscle Mass (lbs) 125.4 lbs 125 lbs  Fat Mass (lbs) 116.6 lbs 114.8 lbs  Total Body Water (lbs) 85.6 lbs 82.8  lbs  Visceral Fat Rating  15 15    Counseling done on how various foods will affect these numbers and how to maximize success  Total Fat mass lost to date: - 30.4  lbs Total lbs lost to date: - 34 lbs Total weight loss percentage to date: - 12. %   Nutritional and Behavioral Counseling:  We discussed the following today: continue to work on maintaining a reduced calorie state, getting the recommended amount of protein, incorporating whole foods, making healthy choices, staying well hydrated and practicing mindfulness when eating.  Additional resources provided today: n/a  Evidence-based interventions for health behavior change were utilized today including the discussion of self monitoring techniques, problem-solving barriers and SMART goal setting techniques.   Regarding patient's less desirable eating habits and patterns, we employed the technique of small changes.   SMART Goal(s) created today: n/a   Recommended Dietary Goals [name] is currently in the action stage of change. As such, her goal is to continue weight management plan.  Cont journaling 1000-1100 cal and 85+ grams protein daily.    Recommended Physical Activity Goals [name] has been advised to work up to 300-450 minutes of moderate intensity aerobic activity a week and strengthening exercises 2-3 times per week for cardiovascular health, weight loss maintenance and preservation of muscle mass.   Cont walking regimen and ADD strengthening exercises with  a goal of 2-3 sessions a week    Medical Interventions and Pharmacotherapy Previous Bariatric surgery: n/a Pharmacotherapy: See T2DM note.   OBESITY RELATED CONDITIONS ADDRESSED TODAY:    Medications Discontinued During This Encounter  Medication Reason   diazepam  (VALIUM ) 5 MG tablet One time medication   Semaglutide , 1 MG/DOSE, 4 MG/3ML SOPN      Meds ordered this encounter  Medications   semaglutide -weight management (WEGOVY ) 1.7 MG/0.75ML SOAJ SQ  injection    Sig: Inject 1.7 mg into the skin once a week.    Dispense:  3 mL    Refill:  1    ** due to technical computer issues- could not select OZEMPIC .   Please dispense ozempic  1.7mg  wkly for txmnt of her DM   Semaglutide , 2 MG/DOSE, 8 MG/3ML SOPN    Sig: Inject 2 mg as directed once a week.    Dispense:  3 mL    Refill:  0     Type 2 diabetes mellitus in patient with obesity [name]) Assessment & Plan: Lab Results  Component Value Date   HGBA1C 5.4 07/10/2024   HGBA1C 5.2 01/24/2024   HGBA1C 5.4 10/28/2023   INSULIN  8.2 07/29/2022    HgbA1c is at goal for age and comorbid conditions. Denies symptoms of hypoglycemia or hyperglycemia. On Ozempic  1 mg weekly with good adherence. Constipation controlled on daily Miralax. She mentions experiencing increased carbohydrate cravings and desires an adjustment in Ozempic  dose. Plan: INCREASE Ozempic  to 2 mg weekly. Reviewed risks/benefits. Encouraged adequate hydration and fiber intake. Cont journaling plan with a focus on protein, fruits, and vegetables while limiting simple carbohydrates. Increase exercise as able.    Objective:   PHYSICAL EXAM: Blood pressure 104/70, pulse 69, temperature 98.1 F (36.7 C), height 5' 6 (1.676 m), weight 246 lb (111.6 kg), SpO2 98%. Body mass index is 39.71 kg/m.  General: she is overweight, cooperative and in no acute distress. PSYCH: Has normal mood, affect and thought process.   HEENT: EOMI, sclerae are anicteric. Lungs: Normal breathing effort, no conversational dyspnea. Extremities: Moves * 4 Neurologic: A and O * 3, good insight  DIAGNOSTIC DATA REVIEWED: BMET    Component Value Date/Time   NA 145 03/31/2024 0000   K 4.5 07/10/2024 0000   CL 105 07/10/2024 0000   CO2 32 (A) 07/10/2024 0000   GLUCOSE 90 12/08/2023 1026   BUN 17 07/10/2024 0000   CREATININE 0.8 07/10/2024 0000   CREATININE 1.00 12/08/2023 1026   CREATININE 1.06 (H) 04/18/2021 0905   CALCIUM 9.6 07/10/2024 0000    GFRNONAA >60 12/08/2023 1026   GFRNONAA 57 (L) 04/18/2021 0905   GFRAA 66 04/18/2021 0905   Lab Results  Component Value Date   HGBA1C 5.4 07/10/2024   HGBA1C 6.0 11/20/2009   Lab Results  Component Value Date   INSULIN  8.2 07/29/2022   Lab Results  Component Value Date   TSH 0.86 07/10/2024   CBC    Component Value Date/Time   WBC 5.3 07/10/2024 0000   WBC 6.2 12/08/2023 1026   WBC 5.5 06/03/2023 1019   RBC 4.50 07/10/2024 0000   HGB 13.5 07/10/2024 0000   HGB 12.8 12/08/2023 1026   HGB 12.8 10/28/2023 0813   HCT 40 07/10/2024 0000   HCT 41.2 10/28/2023 0813   PLT 270 07/10/2024 0000   PLT 268 12/08/2023 1026   PLT 272 10/28/2023 0813   MCV 86.7 12/08/2023 1026   MCV 91 10/28/2023 0813   MCH 29.8 12/08/2023  1026   MCHC 34.4 12/08/2023 1026   RDW 13.8 12/08/2023 1026   RDW 14.6 10/28/2023 0813   Iron Studies No results found for: IRON, TIBC, FERRITIN, IRONPCTSAT Lipid Panel     Component Value Date/Time   CHOL 127 07/10/2024 0000   CHOL 129 07/29/2022 0930   TRIG 83 07/10/2024 0000   HDL 62 07/10/2024 0000   HDL 59 07/29/2022 0930   CHOLHDL 2.2 07/29/2022 0930   CHOLHDL 5.0 Ratio 10/07/2009 1913   VLDL 38 10/07/2009 1913   LDLCALC 48 07/10/2024 0000   LDLCALC 53 07/29/2022 0930   Hepatic Function Panel     Component Value Date/Time   PROT 7.6 12/08/2023 1026   PROT 7.2 10/28/2023 0813   ALBUMIN 4.6 07/10/2024 0000   ALBUMIN 4.3 10/28/2023 0813   AST 39 (A) 07/10/2024 0000   AST 34 12/08/2023 1026   ALT 42 (A) 07/10/2024 0000   ALT 55 (H) 12/08/2023 1026   ALKPHOS 58 07/10/2024 0000   BILITOT 0.4 12/08/2023 1026   BILIDIR <0.1 06/03/2023 1019   IBILI NOT CALCULATED 06/03/2023 1019      Component Value Date/Time   TSH 0.86 07/10/2024 0000   TSH 0.589 07/29/2022 0930   Nutritional Lab Results  Component Value Date   VD25OH 64.34 03/31/2024   VD25OH 76.6 01/24/2024   VD25OH 56.4 09/02/2023     Follow up:   Return 10/17/2024 at  9:40 AM.  She was informed of the importance of frequent follow up visits to maximize her success with intensive lifestyle modifications for her multiple health conditions.   Attestations:   I, Special Puri, acting as a stage manager for [employer], DO., have compiled all relevant documentation for today's office visit on behalf of Tonya Jenkins, DO, while in the presence of [employer], DO.  Pertinent positives were addressed with patient today. Reviewed by clinician on day of visit: allergies, medications, problem list, medical history, surgical history, family history, social history, and previous encounter notes.  I have reviewed the above documentation for accuracy and completeness, and I agree with the above. [name], D.O.  The 21st Century Cures Act was signed into law in 2016 which includes the topic of electronic health records.  This provides immediate access to information in MyChart. This includes consultation notes, operative notes, office notes, lab results and pathology reports.  If you have any questions about what you read please let us  know at your next visit so we can discuss your concerns and take corrective action if need be.  We are right here with you.

## 2024-09-13 NOTE — Telephone Encounter (Signed)
 [name] (Key: [id number])  Your information has been sent to [employer].

## 2024-10-06 ENCOUNTER — Other Ambulatory Visit (INDEPENDENT_AMBULATORY_CARE_PROVIDER_SITE_OTHER): Payer: Self-pay | Admitting: Family Medicine

## 2024-10-17 ENCOUNTER — Encounter (INDEPENDENT_AMBULATORY_CARE_PROVIDER_SITE_OTHER): Payer: Self-pay | Admitting: Family Medicine

## 2024-10-17 ENCOUNTER — Ambulatory Visit (INDEPENDENT_AMBULATORY_CARE_PROVIDER_SITE_OTHER): Admitting: Family Medicine

## 2024-10-17 DIAGNOSIS — E1169 Type 2 diabetes mellitus with other specified complication: Secondary | ICD-10-CM

## 2024-10-17 DIAGNOSIS — Z6841 Body Mass Index (BMI) 40.0 and over, adult: Secondary | ICD-10-CM | POA: Diagnosis not present

## 2024-10-17 DIAGNOSIS — F430 Acute stress reaction: Secondary | ICD-10-CM | POA: Diagnosis not present

## 2024-10-17 DIAGNOSIS — E669 Obesity, unspecified: Secondary | ICD-10-CM

## 2024-10-17 DIAGNOSIS — Z7985 Long-term (current) use of injectable non-insulin antidiabetic drugs: Secondary | ICD-10-CM | POA: Diagnosis not present

## 2024-10-17 DIAGNOSIS — Z6839 Body mass index (BMI) 39.0-39.9, adult: Secondary | ICD-10-CM

## 2024-10-17 MED ORDER — SEMAGLUTIDE (2 MG/DOSE) 8 MG/3ML ~~LOC~~ SOPN: 2.0000 mg | PEN_INJECTOR | SUBCUTANEOUS | 1 refills | Status: DC

## 2024-10-17 NOTE — Progress Notes (Signed)
 "  Tonya DOROTHA Morgan, D.O.  ABFM, ABOM Specializing in Clinical Bariatric Medicine  Office located at: [address] [place], [place]  [address]      A) FOR THE CHRONIC DISEASE OF OBESITY:  Chief complaint: Obesity [name] is here to discuss her progress with her obesity treatment plan.   History of present illness / Interval history:  [name] is here today for her follow-up office visit.  Since last OV on 09/13/24, pt is up 2 lbs. Patient has been stressed recently with her husband being sick and in the hospital. She has not been following the meal plan.    09/13/24 09:00 10/17/24 09:00   Body Fat % 46.6 % 46.8 %  Muscle Mass (lbs) 125 lbs 125.6 lbs  Fat Mass (lbs) 114.8 lbs 116.4 lbs  Total Body Water (lbs) 82.8 lbs 86.4 lbs  Visceral Fat Rating  15 15   Counseling done on how various foods will affect these numbers and how to maximize success.  Total lbs lost to date: - 32 lbs Total Fat Mass in lbs lost to date: - 28.8  Total weight loss percentage to date: - 11.43 %    OBESITY, MORBID with starting BMI 45 BMI 39.0-39.9,adult - current BMI 40.05  Nutrition Therapy She is journaling 1000-1100 cal and 85+ grams protein daily and states she is following her eating plan approximately 50 % of the time.   - Tracking Calories/Macros: no   - Eating More Whole Foods: yes  - Adequate Protein Intake: yes  - Adequate Water Intake: yes  - Skipping Meals: yes  - Sleeping 7-9 Hours/ Night: no - sleeping 5 hours   [name] is currently in the action stage of change. As such, her goal is to continue weight management plan.  She has agreed to: continue current plan   Physical Activity [name] is walking 30  minutes 5 days per week   [name] has been advised to work up to 300-450 minutes of moderate intensity aerobic activity a week and strengthening exercises 2-3 times per week for cardiovascular health, weight loss maintenance and preservation of  muscle mass.  She has agreed to : Think about enjoyable ways to increase daily physical activity and overcoming barriers to exercise and Increase physical activity in their day and reduce sedentary time (increase NEAT).   Behavioral Modifications Evidence-based interventions for health behavior change were utilized today including the discussion of   1) self care:    - Research sleep stories to sleep   2) SMART goals for next OV:    - Manage stress   - Avoid skipping meals   Regarding patient's less desirable eating habits and patterns, we employed the technique of small changes.   We discussed the following today: increasing lean protein intake to established goals, avoiding skipping meals, and work on managing stress, creating time for self-care and relaxation Additional resources provided today: None   Medical Interventions/ Pharmacotherapy Previous Bariatric surgery: n/a Pharmacotherapy for weight loss: She is currently taking Ozempic  2 mg for medical weight loss.    We discussed various medication options to help [name] with her weight loss efforts and we both agreed to : Adequate clinical response to anti-obesity medication, continue current anti-obesity regimen   B) OBESITY RELATED CONDITIONS ADDRESSED TODAY:  Type 2 diabetes mellitus in patient with obesity [facility]) Assessment & Plan Lab Results  Component Value Date   HGBA1C 5.4 07/10/2024   HGBA1C 5.2 01/24/2024   HGBA1C 5.4 10/28/2023  INSULIN  8.2 07/29/2022   On Ozempic  2 mg once weekly with reported good compliance and tolerance. Patient states that she did not have any refills left on her medication. No complains of excessive hunger or cravings. She reports that she can feel the weight loss on Ozempic . She does not feel like she needs to go up at the moment. Will refill today. Continue with medication and following prudent meal plan and decreasing simple carbs.      Acute reaction to stress Assessment &  Plan Patient reports that her husband has recently been sick with the the flu and pneumonia. This has caused him to ed up in the hospital and end up having to need dialysis. This has caused a lot of stress on her and she has not been able to sleep due to this as well. She will get in bed and will just toss and turn. She reports that she does take Melatonin but that does not always work. Reminded to patient that she needed to find ways to de stress because taking care of herself is important. Recommended tht patient use Calm,Headspace, Slumber or Medito to help her sleep and wind down for the night. These apps are made to help calm breathing and just quiet the mind at night. Will reassess at next OV.   Medications Discontinued During This Encounter  Medication Reason   semaglutide -weight management (WEGOVY ) 1.7 MG/0.75ML SOAJ SQ injection    Semaglutide , 2 MG/DOSE, 8 MG/3ML SOPN Reorder     Meds ordered this encounter  Medications   Semaglutide , 2 MG/DOSE, 8 MG/3ML SOPN    Sig: Inject 2 mg as directed once a week.    Dispense:  3 mL    Refill:  1    Follow up:   Return 11/16/2024 at 9:00 AM  She was informed of the importance of frequent follow up visits to maximize her success with intensive lifestyle modifications for her multiple health conditions.   Weight Summary and Biometrics   Weight Lost Since Last Visit: 0lb  Weight Gained Since Last Visit: 2lb   Vitals Temp: 97.9 F (36.6 C) BP: 107/69 Pulse Rate: 65 SpO2: 97 %   Anthropometric Measurements Height: 5' 6 (1.676 m) Weight: 248 lb (112.5 kg) BMI (Calculated): 40.05 Weight at Last Visit: 246lb Weight Lost Since Last Visit: 0lb Weight Gained Since Last Visit: 2lb Starting Weight: 280lb Total Weight Loss (lbs): 32 lb (14.5 kg) Peak Weight: 330lb   Body Composition  Body Fat %: 46.8 % Fat Mass (lbs): 116.4 lbs Muscle Mass (lbs): 125.6 lbs Total Body Water (lbs): 86.4 lbs Visceral Fat Rating : 15   Other  Clinical Data Fasting: no Labs: no Today's Visit #: 27 Starting Date: 07/29/22    Objective:   PHYSICAL EXAM: Blood pressure 107/69, pulse 65, temperature 97.9 F (36.6 C), height 5' 6 (1.676 m), weight 248 lb (112.5 kg), SpO2 97%. Body mass index is 40.03 kg/m.  General: she is overweight, cooperative and in no acute distress. PSYCH: Has normal mood, affect and thought process.   HEENT: EOMI, sclerae are anicteric. Lungs: Normal breathing effort, no conversational dyspnea. Extremities: Moves * 4 Neurologic: A and O * 3, good insight  DIAGNOSTIC DATA REVIEWED: BMET    Component Value Date/Time   NA 145 03/31/2024 0000   K 4.5 07/10/2024 0000   CL 105 07/10/2024 0000   CO2 32 (A) 07/10/2024 0000   GLUCOSE 90 12/08/2023 1026   BUN 17 07/10/2024 0000   CREATININE 0.8  07/10/2024 0000   CREATININE 1.00 12/08/2023 1026   CREATININE 1.06 (H) 04/18/2021 0905   CALCIUM 9.6 07/10/2024 0000   GFRNONAA >60 12/08/2023 1026   GFRNONAA 57 (L) 04/18/2021 0905   GFRAA 66 04/18/2021 0905   Lab Results  Component Value Date   HGBA1C 5.4 07/10/2024   HGBA1C 6.0 11/20/2009   Lab Results  Component Value Date   INSULIN  8.2 07/29/2022   Lab Results  Component Value Date   TSH 0.86 07/10/2024   CBC    Component Value Date/Time   WBC 5.3 07/10/2024 0000   WBC 6.2 12/08/2023 1026   WBC 5.5 06/03/2023 1019   RBC 4.50 07/10/2024 0000   HGB 13.5 07/10/2024 0000   HGB 12.8 12/08/2023 1026   HGB 12.8 10/28/2023 0813   HCT 40 07/10/2024 0000   HCT 41.2 10/28/2023 0813   PLT 270 07/10/2024 0000   PLT 268 12/08/2023 1026   PLT 272 10/28/2023 0813   MCV 86.7 12/08/2023 1026   MCV 91 10/28/2023 0813   MCH 29.8 12/08/2023 1026   MCHC 34.4 12/08/2023 1026   RDW 13.8 12/08/2023 1026   RDW 14.6 10/28/2023 0813   Iron Studies No results found for: IRON, TIBC, FERRITIN, IRONPCTSAT Lipid Panel     Component Value Date/Time   CHOL 127 07/10/2024 0000   CHOL 129  07/29/2022 0930   TRIG 83 07/10/2024 0000   HDL 62 07/10/2024 0000   HDL 59 07/29/2022 0930   CHOLHDL 2.2 07/29/2022 0930   CHOLHDL 5.0 Ratio 10/07/2009 1913   VLDL 38 10/07/2009 1913   LDLCALC 48 07/10/2024 0000   LDLCALC 53 07/29/2022 0930   Hepatic Function Panel     Component Value Date/Time   PROT 7.6 12/08/2023 1026   PROT 7.2 10/28/2023 0813   ALBUMIN 4.6 07/10/2024 0000   ALBUMIN 4.3 10/28/2023 0813   AST 39 (A) 07/10/2024 0000   AST 34 12/08/2023 1026   ALT 42 (A) 07/10/2024 0000   ALT 55 (H) 12/08/2023 1026   ALKPHOS 58 07/10/2024 0000   BILITOT 0.4 12/08/2023 1026   BILIDIR <0.1 06/03/2023 1019   IBILI NOT CALCULATED 06/03/2023 1019      Component Value Date/Time   TSH 0.86 07/10/2024 0000   TSH 0.589 07/29/2022 0930   Nutritional Lab Results  Component Value Date   VD25OH 64.34 03/31/2024   VD25OH 76.6 01/24/2024   VD25OH 56.4 09/02/2023    Attestations:   I, Sonny Laroche, acting as a stage manager for Tonya Jenkins, DO., have compiled all relevant documentation for today's office visit on behalf of Tonya Jenkins, DO, while in the presence of [employer], DO.  I have reviewed the above documentation for accuracy and completeness, and I agree with the above. [name], D.O.  The 21st Century Cures Act was signed into law in 2016 which includes the topic of electronic health records.  This provides immediate access to information in MyChart.  This includes consultation notes, operative notes, office notes, lab results and pathology reports.  If you have any questions about what you read please let us  know at your next visit so we can discuss your concerns and take corrective action if need be.  We are right here with you.  "

## 2024-11-15 ENCOUNTER — Ambulatory Visit (INDEPENDENT_AMBULATORY_CARE_PROVIDER_SITE_OTHER): Admitting: Family Medicine

## 2024-11-15 ENCOUNTER — Encounter (INDEPENDENT_AMBULATORY_CARE_PROVIDER_SITE_OTHER): Payer: Self-pay | Admitting: Family Medicine

## 2024-11-15 DIAGNOSIS — E559 Vitamin D deficiency, unspecified: Secondary | ICD-10-CM

## 2024-11-15 DIAGNOSIS — Z6839 Body mass index (BMI) 39.0-39.9, adult: Secondary | ICD-10-CM

## 2024-11-15 DIAGNOSIS — E669 Obesity, unspecified: Secondary | ICD-10-CM

## 2024-11-15 MED ORDER — SEMAGLUTIDE (2 MG/DOSE) 8 MG/3ML ~~LOC~~ SOPN: 2.0000 mg | PEN_INJECTOR | SUBCUTANEOUS | 1 refills | Status: AC

## 2024-11-15 NOTE — Progress Notes (Signed)
 "  [name], D.O.  ABFM, ABOM Specializing in Clinical Bariatric Medicine  Office located at: [address] [place], [place]  [address]    Medications Discontinued During This Encounter  Medication Reason   Semaglutide , 2 MG/DOSE, 8 MG/3ML SOPN Reorder     Meds ordered this encounter  Medications   Semaglutide , 2 MG/DOSE, 8 MG/3ML SOPN    Sig: Inject 2 mg as directed once a week.    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   History of present illness / Interval history:  [name] is here today for her follow-up office visit.  Since last OV on 10/17/2024 with provider: Dr. Jenkins, patient is down 5 lbs.  Pt reports no concerns with MP --> good adherence to reduced calorie nutritional plan.  Pt has been struggling with:  []  meal planning and prepping []  exercise []  sleep [x]  stressors []  mood []  chronic or acute medical conditions []  eating out more []  Nothing, doing great  Pt has been working on and improving their:  [x]  meal planning and prepping []  exercise []  sleep hygiene []  water intake []  strategies to better manage personal stressors []  strategies to better manage mood []  eating out less []  Nothing particular at this time  When asked by CMA prior to our office visit today, pt states they have been:  - Focused on eating fresh, unprocessed foods?  Yes   - Focused on eating lean proteins with each meal?  Yes   - Sleeping 7-9 Hours/ Night? Yes   - Skipping Meals?  Yes- skips a meal about once a week - States she is following her healthy eating plan approximately 85% of the time.   Recent weight loss data history  10/17/24 09:00 11/15/24 08:00   Body Fat % 46.8 % 45.9 %  Muscle Mass (lbs) 125.6 lbs 124.8 lbs  Fat Mass  (lbs) 116.4 lbs 111.6 lbs  Total Body Water (lbs) 86.4 lbs 82 lbs  Visceral Fat Rating  15 15    Counseling done on how various foods/ behaviors will affect these numbers and how to maximize weight loss success discussed today in detail based on these findings. Total lbs lost to date: -37 lbs Total Fat Mass in lbs lost to date: -33.6 lbs Total weight loss percentage to date since starting program:  -13.21 %    Physician directed Nutrition Therapy prescription: She is journaling 1000-1100 cal and 85+ grams protein daily.  Infiniti is currently in the action stage of change. As such, her goal is to continue weight management plan.   She has agreed to: continue current reduced-calorie meal plan   Physician directed Behavioral Modification prescription: Evidence-based interventions for healthy behavior change were utilized today including the discussion of small changes and SMART goals.  We discussed the following today: increasing lean protein intake to established goals, increasing fiber rich foods, work on tracking and journaling calories using tracking application, keeping healthy foods at home, work on managing stress, creating time for self-care and relaxation, and continue to work on implementation of reduced calorie nutritional plan, and healthy alternatives to favorite foods.   Additional resources provided: Characteristics of Successful Weight Loser and Weight Maintainers.   Physician directed Physical Activity prescription: Pt  is not doing formal exercise, but is increasing NEAT.  barriers to successful adherence to exercise for wt loss: not enough time  Garnetta has been educated on importance of strength training to enhance/ sustain muscle mass along with prudent nutrition  Exercise prescription: She has agreed to Think about enjoyable ways to increase daily physical activity and overcoming barriers to exercise and Increase physical activity in their day and reduce  sedentary time (increase NEAT). Pt will look into [facility].     Medical Interventions/ Pharmacotherapy Previous Bariatric surgery: none    Pharmacotherapy for weight loss: She is currently taking Ozempic  2 mg for medical weight loss.     We discussed various medication options to help Delpha with her weight loss efforts and we both agreed to:  Continue with current nutritional and behavioral strategies and Continue ozempic  at current dose.   SPECIFIC behavorial / nutritional / exercise goals for next office visit:  plans to start exercising.  Specifically: Start walking 3 days week at around 101 BPM.  B) OBESITY RELATED CONDITIONS ADDRESSED TODAY:   Type 2 diabetes mellitus in patient with obesity [facility]) Assessment & Plan Lab Results  Component Value Date   HGBA1C 5.4 07/10/2024   HGBA1C 5.2 01/24/2024   HGBA1C 5.4 10/28/2023   INSULIN  8.2 07/29/2022  Currently on Ozempic  2 mg once weekly with good compliance and tolerance. Hunger and cravings are well controlled. Denies side effects. Pt reports no concerns today. Cont medication (refill today). Cont to decrease simple carbs, increase lean protein, and increase exercise to help manage T2DM. F/u with PCP as needed for all diabetic exams.    Vitamin D  deficiency Assessment & Plan Lab Results  Component Value Date   VD25OH 64.34 03/31/2024   VD25OH 76.6 01/24/2024   VD25OH 56.4 09/02/2023  Currently on OTC Vit D with good compliance and tolerance. Vit D levels are at goal. Cont regimen. Will recheck levels as deemed clinically necessary.      Follow up:   Return 12/13/2024 9:20 AM.   She was informed of the importance of frequent follow up visits to maximize her success with intensive lifestyle modifications for her multiple health conditions.   Weight Summary and Biometrics   Weight Lost Since Last Visit: 5lb  Weight Gained Since Last Visit: 0lb    Vitals Temp: 97.9 F (36.6 C) BP: 109/71 Pulse Rate:  70 SpO2: 97 %   Anthropometric Measurements Height: 5' 6 (1.676 m) Weight: 243 lb (110.2 kg) BMI (Calculated): 39.24 Weight at Last Visit: 248lb Weight Lost Since Last Visit: 5lb Weight Gained Since Last Visit: 0lb Starting Weight: 280lb Total Weight Loss (lbs): 37 lb (16.8 kg) Peak Weight: 330lb   Body Composition  Body Fat %: 45.9 % Fat Mass (lbs): 111.6 lbs Muscle Mass (lbs): 124.8 lbs Total Body Water (lbs): 82 lbs Visceral Fat Rating : 15   Other Clinical Data Fasting: no Labs: no Today's Visit #: 28 Starting Date: 07/29/22    Objective:   PHYSICAL EXAM: Blood pressure 109/71, pulse 70, temperature 97.9 F (36.6 C), height 5' 6 (1.676 m), weight 243 lb (110.2 kg), SpO2 97%. Body mass index is 39.22 kg/m. General: she is overweight, cooperative and in no acute distress. PSYCH: Has normal mood, affect and thought process.   HEENT: EOMI, sclerae are anicteric. Lungs: Normal breathing effort, no conversational dyspnea. Extremities: Moves * 4 Neurologic: A and O * 3, good insight  DIAGNOSTIC DATA REVIEWED: BMET    Component Value Date/Time  NA 145 03/31/2024 0000   K 4.5 07/10/2024 0000   CL 105 07/10/2024 0000   CO2 32 (A) 07/10/2024 0000   GLUCOSE 90 12/08/2023 1026   BUN 17 07/10/2024 0000   CREATININE 0.8 07/10/2024 0000   CREATININE 1.00 12/08/2023 1026   CREATININE 1.06 (H) 04/18/2021 0905   CALCIUM 9.6 07/10/2024 0000   GFRNONAA >60 12/08/2023 1026   GFRNONAA 57 (L) 04/18/2021 0905   GFRAA 66 04/18/2021 0905   Lab Results  Component Value Date   HGBA1C 5.4 07/10/2024   HGBA1C 6.0 11/20/2009   Lab Results  Component Value Date   INSULIN  8.2 07/29/2022   Lab Results  Component Value Date   TSH 0.86 07/10/2024   CBC    Component Value Date/Time   WBC 5.3 07/10/2024 0000   WBC 6.2 12/08/2023 1026   WBC 5.5 06/03/2023 1019   RBC 4.50 07/10/2024 0000   HGB 13.5 07/10/2024 0000   HGB 12.8 12/08/2023 1026   HGB 12.8 10/28/2023  0813   HCT 40 07/10/2024 0000   HCT 41.2 10/28/2023 0813   PLT 270 07/10/2024 0000   PLT 268 12/08/2023 1026   PLT 272 10/28/2023 0813   MCV 86.7 12/08/2023 1026   MCV 91 10/28/2023 0813   MCH 29.8 12/08/2023 1026   MCHC 34.4 12/08/2023 1026   RDW 13.8 12/08/2023 1026   RDW 14.6 10/28/2023 0813   Iron Studies No results found for: IRON, TIBC, FERRITIN, IRONPCTSAT Lipid Panel     Component Value Date/Time   CHOL 127 07/10/2024 0000   CHOL 129 07/29/2022 0930   TRIG 83 07/10/2024 0000   HDL 62 07/10/2024 0000   HDL 59 07/29/2022 0930   CHOLHDL 2.2 07/29/2022 0930   CHOLHDL 5.0 Ratio 10/07/2009 1913   VLDL 38 10/07/2009 1913   LDLCALC 48 07/10/2024 0000   LDLCALC 53 07/29/2022 0930   Hepatic Function Panel     Component Value Date/Time   PROT 7.6 12/08/2023 1026   PROT 7.2 10/28/2023 0813   ALBUMIN 4.6 07/10/2024 0000   ALBUMIN 4.3 10/28/2023 0813   AST 39 (A) 07/10/2024 0000   AST 34 12/08/2023 1026   ALT 42 (A) 07/10/2024 0000   ALT 55 (H) 12/08/2023 1026   ALKPHOS 58 07/10/2024 0000   BILITOT 0.4 12/08/2023 1026   BILIDIR <0.1 06/03/2023 1019   IBILI NOT CALCULATED 06/03/2023 1019      Component Value Date/Time   TSH 0.86 07/10/2024 0000   TSH 0.589 07/29/2022 0930   Nutritional Lab Results  Component Value Date   VD25OH 64.34 03/31/2024   VD25OH 76.6 01/24/2024   VD25OH 56.4 09/02/2023    Attestations:   I, Feliciano Mingle, acting as a stage manager for Tonya Jenkins, DO., have compiled all relevant documentation for today's office visit on behalf of Tonya Jenkins, DO, while in the presence of [employer], DO.  I have reviewed the above documentation for accuracy and completeness, and I agree with the above. Tonya JINNY Morgan, D.O.  The 21st Century Cures Act was signed into law in 2016 which includes the topic of electronic health records.  This provides immediate access to information in MyChart.  This includes consultation notes,  operative notes, office notes, lab results and pathology reports.  If you have any questions about what you read please let us  know at your next visit so we can discuss your concerns and take corrective action if need be.  We are right here with you.  "

## 2024-11-16 ENCOUNTER — Ambulatory Visit (INDEPENDENT_AMBULATORY_CARE_PROVIDER_SITE_OTHER): Admitting: Family Medicine

## 2024-11-16 NOTE — Telephone Encounter (Signed)
 Archived by [employer] Support for [id number] [facility] about 1 month ago

## 2024-12-06 ENCOUNTER — Inpatient Hospital Stay: Payer: Medicaid Other

## 2024-12-06 ENCOUNTER — Inpatient Hospital Stay: Payer: Medicaid Other | Admitting: Hematology and Oncology

## 2024-12-13 ENCOUNTER — Ambulatory Visit (INDEPENDENT_AMBULATORY_CARE_PROVIDER_SITE_OTHER): Admitting: Family Medicine

## 2025-01-10 ENCOUNTER — Ambulatory Visit (INDEPENDENT_AMBULATORY_CARE_PROVIDER_SITE_OTHER): Admitting: Family Medicine
